# Patient Record
Sex: Female | Born: 1940 | Race: White | Hispanic: No | Smoking: Never smoker
Health system: Southern US, Community
[De-identification: ages and names within clinical notes are randomized; demographics above are authoritative.]

## PROBLEM LIST (undated history)

## (undated) DIAGNOSIS — G4730 Sleep apnea, unspecified: Secondary | ICD-10-CM

## (undated) DIAGNOSIS — C801 Malignant (primary) neoplasm, unspecified: Secondary | ICD-10-CM

## (undated) DIAGNOSIS — I2699 Other pulmonary embolism without acute cor pulmonale: Secondary | ICD-10-CM

## (undated) DIAGNOSIS — G629 Polyneuropathy, unspecified: Secondary | ICD-10-CM

## (undated) DIAGNOSIS — I10 Essential (primary) hypertension: Secondary | ICD-10-CM

## (undated) DIAGNOSIS — E785 Hyperlipidemia, unspecified: Secondary | ICD-10-CM

## (undated) DIAGNOSIS — N189 Chronic kidney disease, unspecified: Secondary | ICD-10-CM

## (undated) DIAGNOSIS — M1990 Unspecified osteoarthritis, unspecified site: Secondary | ICD-10-CM

## (undated) DIAGNOSIS — F329 Major depressive disorder, single episode, unspecified: Secondary | ICD-10-CM

## (undated) DIAGNOSIS — F32A Depression, unspecified: Secondary | ICD-10-CM

## (undated) HISTORY — PX: OTHER SURGICAL HISTORY: SHX169

## (undated) HISTORY — PX: ABDOMINAL HYSTERECTOMY: SHX81

## (undated) HISTORY — PX: CHOLECYSTECTOMY: SHX55

## (undated) HISTORY — DX: Polyneuropathy, unspecified: G62.9

---

## 2004-08-04 ENCOUNTER — Emergency Department: Payer: Self-pay | Admitting: Emergency Medicine

## 2004-08-25 ENCOUNTER — Ambulatory Visit: Payer: Self-pay

## 2004-10-05 ENCOUNTER — Other Ambulatory Visit: Payer: Self-pay

## 2004-10-19 ENCOUNTER — Ambulatory Visit: Payer: Self-pay | Admitting: General Practice

## 2004-10-31 ENCOUNTER — Encounter: Payer: Self-pay | Admitting: General Practice

## 2004-11-22 ENCOUNTER — Encounter: Payer: Self-pay | Admitting: General Practice

## 2004-12-23 ENCOUNTER — Encounter: Payer: Self-pay | Admitting: General Practice

## 2007-06-11 ENCOUNTER — Ambulatory Visit: Payer: Self-pay | Admitting: Internal Medicine

## 2007-06-14 ENCOUNTER — Ambulatory Visit: Payer: Self-pay | Admitting: Internal Medicine

## 2007-08-18 ENCOUNTER — Ambulatory Visit: Payer: Self-pay | Admitting: Internal Medicine

## 2007-12-13 ENCOUNTER — Ambulatory Visit: Payer: Self-pay | Admitting: Internal Medicine

## 2008-04-24 HISTORY — PX: GASTRIC BYPASS: SHX52

## 2008-07-09 ENCOUNTER — Ambulatory Visit: Payer: Self-pay | Admitting: Internal Medicine

## 2008-07-21 ENCOUNTER — Ambulatory Visit: Payer: Self-pay | Admitting: Gastroenterology

## 2009-01-25 ENCOUNTER — Ambulatory Visit: Payer: Self-pay | Admitting: Nephrology

## 2009-04-24 HISTORY — PX: FACIAL COSMETIC SURGERY: SHX629

## 2009-05-14 ENCOUNTER — Ambulatory Visit: Payer: Self-pay | Admitting: Internal Medicine

## 2010-05-19 ENCOUNTER — Ambulatory Visit: Payer: Self-pay | Admitting: Internal Medicine

## 2011-05-13 ENCOUNTER — Ambulatory Visit: Payer: Self-pay

## 2011-05-13 LAB — URINALYSIS, COMPLETE

## 2011-05-15 LAB — URINE CULTURE

## 2011-05-22 ENCOUNTER — Ambulatory Visit: Payer: Self-pay | Admitting: Internal Medicine

## 2011-10-30 ENCOUNTER — Encounter (INDEPENDENT_AMBULATORY_CARE_PROVIDER_SITE_OTHER): Payer: Medicare Other | Admitting: Ophthalmology

## 2011-10-30 DIAGNOSIS — H43819 Vitreous degeneration, unspecified eye: Secondary | ICD-10-CM

## 2011-10-30 DIAGNOSIS — H33009 Unspecified retinal detachment with retinal break, unspecified eye: Secondary | ICD-10-CM

## 2011-10-30 DIAGNOSIS — D3130 Benign neoplasm of unspecified choroid: Secondary | ICD-10-CM

## 2011-10-30 DIAGNOSIS — H2510 Age-related nuclear cataract, unspecified eye: Secondary | ICD-10-CM

## 2011-10-30 NOTE — H&P (Signed)
Ann Russell is an 71 y.o. female.   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  No past surgical history on file.  No family history on file. Social History:  does not have a smoking history on file. She does not have any smokeless tobacco history on file. Her alcohol and drug histories not on file.  Allergies: Allergies not on file  No prescriptions prior to admission    Review of systems otherwise negative  There were no vitals taken for this visit.  Physical exam: Mental status: oriented x3. Eyes: See eye exam associated with this date of surgery in media tab.  Scanned in by scanning center Ears, Nose, Throat: within normal limits Neck: Within Normal limits General: within normal limits Chest: Within normal limits Breast: deferred Heart: Within normal limits Abdomen: Within normal limits GU: deferred Extremities: within normal limits Skin: within normal limits  Assessment/Plan Chronic inferior retinal detachment left eye Plan: To [facility] for Scleral buckle left eye  MATTHEWS, Chrystie Nose 10/30/2011, 5:11 PM

## 2011-10-31 ENCOUNTER — Encounter (HOSPITAL_COMMUNITY): Payer: Self-pay | Admitting: Pharmacy Technician

## 2011-11-06 ENCOUNTER — Encounter (HOSPITAL_COMMUNITY): Payer: Self-pay | Admitting: *Deleted

## 2011-11-06 MED ORDER — CEFAZOLIN SODIUM-DEXTROSE 2-3 GM-% IV SOLR
2.0000 g | INTRAVENOUS | Status: DC
  Filled 2011-11-06: qty 50

## 2011-11-06 MED ORDER — TROPICAMIDE 1 % OP SOLN: 1.0000 [drp] | OPHTHALMIC | Status: DC | PRN

## 2011-11-06 MED ORDER — CYCLOPENTOLATE HCL 1 % OP SOLN: 1.0000 [drp] | OPHTHALMIC | Status: DC | PRN

## 2011-11-06 MED ORDER — PHENYLEPHRINE HCL 2.5 % OP SOLN: 1.0000 [drp] | OPHTHALMIC | Status: DC | PRN

## 2011-11-06 MED ORDER — GATIFLOXACIN 0.5 % OP SOLN: 1.0000 [drp] | OPHTHALMIC | Status: DC | PRN

## 2011-11-06 NOTE — Progress Notes (Addendum)
Cardiologist Dr Drema Dallas at [place] [facility]. Nephrologist is Dr Holley Raring in [place].  Pt has had a EKG, she thinks in he past year and she has a history of sleep apnea, CPAP has been discontinued after 100 lb weight loss- pt was followed by cardiologist about sleep apena.  I faxed a request for EKG, Stress Test, 2 D echo. , office notes , CPAP information- -notes that she was cleared from using the CPAP.  Dr Mack Guise is her PCP.

## 2011-11-07 ENCOUNTER — Encounter (HOSPITAL_COMMUNITY): Payer: Self-pay | Admitting: Anesthesiology

## 2011-11-07 ENCOUNTER — Other Ambulatory Visit: Payer: Self-pay

## 2011-11-07 ENCOUNTER — Ambulatory Visit (HOSPITAL_COMMUNITY)
Admission: RE | Admit: 2011-11-07 | Discharge: 2011-11-08 | Disposition: A | Discharge status: Home / Self Care | Payer: Medicare Other | Source: Ambulatory Visit | Attending: Ophthalmology | Admitting: Ophthalmology

## 2011-11-07 ENCOUNTER — Encounter (HOSPITAL_COMMUNITY): Payer: Self-pay | Admitting: *Deleted

## 2011-11-07 ENCOUNTER — Encounter (HOSPITAL_COMMUNITY)
Admission: RE | Disposition: A | Discharge status: Home / Self Care | Payer: Self-pay | Source: Ambulatory Visit | Attending: Ophthalmology

## 2011-11-07 ENCOUNTER — Ambulatory Visit (HOSPITAL_COMMUNITY): Payer: Medicare Other | Admitting: Anesthesiology

## 2011-11-07 ENCOUNTER — Ambulatory Visit (HOSPITAL_COMMUNITY): Payer: Medicare Other

## 2011-11-07 DIAGNOSIS — H33009 Unspecified retinal detachment with retinal break, unspecified eye: Secondary | ICD-10-CM

## 2011-11-07 DIAGNOSIS — N289 Disorder of kidney and ureter, unspecified: Secondary | ICD-10-CM | POA: Insufficient documentation

## 2011-11-07 DIAGNOSIS — I10 Essential (primary) hypertension: Secondary | ICD-10-CM | POA: Insufficient documentation

## 2011-11-07 DIAGNOSIS — G4730 Sleep apnea, unspecified: Secondary | ICD-10-CM | POA: Insufficient documentation

## 2011-11-07 HISTORY — DX: Depression, unspecified: F32.A

## 2011-11-07 HISTORY — DX: Chronic kidney disease, unspecified: N18.9

## 2011-11-07 HISTORY — DX: Major depressive disorder, single episode, unspecified: F32.9

## 2011-11-07 HISTORY — DX: Hyperlipidemia, unspecified: E78.5

## 2011-11-07 HISTORY — DX: Unspecified osteoarthritis, unspecified site: M19.90

## 2011-11-07 HISTORY — DX: Essential (primary) hypertension: I10

## 2011-11-07 HISTORY — DX: Sleep apnea, unspecified: G47.30

## 2011-11-07 HISTORY — PX: SCLERAL BUCKLE: SHX5340

## 2011-11-07 HISTORY — DX: Malignant (primary) neoplasm, unspecified: C80.1

## 2011-11-07 LAB — BASIC METABOLIC PANEL
Chloride: 100 mEq/L (ref 96–112)
GFR calc Af Amer: 42 mL/min — ABNORMAL LOW (ref >90)
GFR calc non Af Amer: 36 mL/min — ABNORMAL LOW (ref >90)
Glucose, Bld: 97 mg/dL (ref 70–99)
Potassium: 4.5 mEq/L (ref 3.5–5.1)
Sodium: 134 mEq/L — ABNORMAL LOW (ref 135–145)

## 2011-11-07 LAB — SURGICAL PCR SCREEN
MRSA, PCR: NEGATIVE
Staphylococcus aureus: NEGATIVE

## 2011-11-07 LAB — CBC
Hemoglobin: 11.9 g/dL — ABNORMAL LOW (ref 12.0–15.0)
MCHC: 34.4 g/dL (ref 30.0–36.0)
RDW: 15.6 % — ABNORMAL HIGH (ref 11.5–15.5)
WBC: 7.1 10*3/uL (ref 4.0–10.5)

## 2011-11-07 SURGERY — SCLERAL BUCKLE
Anesthesia: General | Site: Eye | Laterality: Left | Wound class: Clean

## 2011-11-07 MED ORDER — MORPHINE SULFATE 2 MG/ML IJ SOLN
1.0000 mg | INTRAMUSCULAR | Status: DC | PRN
  Administered 2011-11-07: 2 mg via INTRAVENOUS
  Filled 2011-11-07: qty 1

## 2011-11-07 MED ORDER — ATROPINE SULFATE 1 % OP SOLN
OPHTHALMIC | Status: DC | PRN
  Administered 2011-11-07: 1 [drp] via OPHTHALMIC

## 2011-11-07 MED ORDER — BACITRACIN-POLYMYXIN B 500-10000 UNIT/GM OP OINT
TOPICAL_OINTMENT | OPHTHALMIC | Status: AC
  Filled 2011-11-07: qty 3.5

## 2011-11-07 MED ORDER — BACITRACIN-POLYMYXIN B 500-10000 UNIT/GM OP OINT
TOPICAL_OINTMENT | OPHTHALMIC | Status: DC | PRN
  Administered 2011-11-07: 1 via OPHTHALMIC

## 2011-11-07 MED ORDER — EPINEPHRINE HCL 1 MG/ML IJ SOLN
INTRAMUSCULAR | Status: AC
  Filled 2011-11-07: qty 1

## 2011-11-07 MED ORDER — HYDROMORPHONE HCL PF 1 MG/ML IJ SOLN
0.2500 mg | INTRAMUSCULAR | Status: DC | PRN
  Administered 2011-11-07 (×3): 0.5 mg via INTRAVENOUS

## 2011-11-07 MED ORDER — ENALAPRIL MALEATE 2.5 MG PO TABS
2.5000 mg | ORAL_TABLET | Freq: Two times a day (BID) | ORAL | Status: DC
  Administered 2011-11-07: 2.5 mg via ORAL
  Filled 2011-11-07 (×4): qty 1

## 2011-11-07 MED ORDER — BUPIVACAINE HCL 0.75 % IJ SOLN
INTRAMUSCULAR | Status: DC | PRN
  Administered 2011-11-07: 10 mL

## 2011-11-07 MED ORDER — MIDAZOLAM HCL 2 MG/2ML IJ SOLN: 0.5000 mg | Freq: Once | INTRAMUSCULAR | Status: DC | PRN

## 2011-11-07 MED ORDER — TEMAZEPAM 15 MG PO CAPS: 15.0000 mg | ORAL_CAPSULE | Freq: Every evening | ORAL | Status: DC | PRN

## 2011-11-07 MED ORDER — CYCLOPENTOLATE HCL 1 % OP SOLN
1.0000 [drp] | OPHTHALMIC | Status: AC | PRN
  Administered 2011-11-07 (×3): 1 [drp] via OPHTHALMIC
  Filled 2011-11-07: qty 2

## 2011-11-07 MED ORDER — FLUOXETINE HCL 20 MG PO CAPS
40.0000 mg | ORAL_CAPSULE | Freq: Every day | ORAL | Status: DC
  Administered 2011-11-08: 40 mg via ORAL
  Filled 2011-11-07: qty 2

## 2011-11-07 MED ORDER — BSS PLUS IO SOLN
INTRAOCULAR | Status: AC
  Filled 2011-11-07: qty 500

## 2011-11-07 MED ORDER — PREDNISOLONE ACETATE 1 % OP SUSP
1.0000 [drp] | Freq: Four times a day (QID) | OPHTHALMIC | Status: DC
  Administered 2011-11-08: 1 [drp] via OPHTHALMIC
  Filled 2011-11-07: qty 1

## 2011-11-07 MED ORDER — LATANOPROST 0.005 % OP SOLN
1.0000 [drp] | Freq: Every day | OPHTHALMIC | Status: DC
  Filled 2011-11-07: qty 2.5

## 2011-11-07 MED ORDER — LIDOCAINE HCL (CARDIAC) 20 MG/ML IV SOLN
INTRAVENOUS | Status: DC | PRN
  Administered 2011-11-07: 20 mg via INTRAVENOUS

## 2011-11-07 MED ORDER — BUPIVACAINE HCL 0.75 % IJ SOLN
INTRAMUSCULAR | Status: AC
  Filled 2011-11-07: qty 10

## 2011-11-07 MED ORDER — GATIFLOXACIN 0.5 % OP SOLN
1.0000 [drp] | OPHTHALMIC | Status: AC | PRN
  Administered 2011-11-07 (×3): 1 [drp] via OPHTHALMIC
  Filled 2011-11-07 (×3): qty 2.5

## 2011-11-07 MED ORDER — CIPROFLOXACIN HCL 500 MG PO TABS
500.0000 mg | ORAL_TABLET | Freq: Two times a day (BID) | ORAL | Status: DC
  Administered 2011-11-07 – 2011-11-08 (×2): 500 mg via ORAL
  Filled 2011-11-07 (×3): qty 1

## 2011-11-07 MED ORDER — ROCURONIUM BROMIDE 100 MG/10ML IV SOLN
INTRAVENOUS | Status: DC | PRN
  Administered 2011-11-07: 50 mg via INTRAVENOUS

## 2011-11-07 MED ORDER — NEOSTIGMINE METHYLSULFATE 1 MG/ML IJ SOLN
INTRAMUSCULAR | Status: DC | PRN
  Administered 2011-11-07: 4 mg via INTRAVENOUS

## 2011-11-07 MED ORDER — TROPICAMIDE 1 % OP SOLN
1.0000 [drp] | OPHTHALMIC | Status: AC | PRN
  Administered 2011-11-07 (×3): 1 [drp] via OPHTHALMIC
  Filled 2011-11-07: qty 3

## 2011-11-07 MED ORDER — TETRACAINE HCL 0.5 % OP SOLN
2.0000 [drp] | Freq: Once | OPHTHALMIC | Status: DC
  Filled 2011-11-07: qty 2

## 2011-11-07 MED ORDER — 0.9 % SODIUM CHLORIDE (POUR BTL) OPTIME
TOPICAL | Status: DC | PRN
  Administered 2011-11-07: 100 mL

## 2011-11-07 MED ORDER — HYDROMORPHONE HCL PF 1 MG/ML IJ SOLN
INTRAMUSCULAR | Status: AC
  Filled 2011-11-07: qty 1

## 2011-11-07 MED ORDER — BSS IO SOLN
INTRAOCULAR | Status: DC | PRN
  Administered 2011-11-07: 15 mL via INTRAOCULAR

## 2011-11-07 MED ORDER — SODIUM CHLORIDE 0.45 % IV SOLN
INTRAVENOUS | Status: DC
  Administered 2011-11-07: 20 mL/h via INTRAVENOUS

## 2011-11-07 MED ORDER — DEXAMETHASONE SODIUM PHOSPHATE 10 MG/ML IJ SOLN
INTRAMUSCULAR | Status: AC
  Filled 2011-11-07: qty 1

## 2011-11-07 MED ORDER — MINERAL OIL LIGHT 100 % EX OIL
TOPICAL_OIL | CUTANEOUS | Status: AC
  Filled 2011-11-07: qty 25

## 2011-11-07 MED ORDER — MAGNESIUM HYDROXIDE 400 MG/5ML PO SUSP: 15.0000 mL | Freq: Four times a day (QID) | ORAL | Status: DC | PRN

## 2011-11-07 MED ORDER — ACETAZOLAMIDE SODIUM 500 MG IJ SOLR
1000.0000 mg | Freq: Once | INTRAMUSCULAR | Status: AC
  Administered 2011-11-08: 1000 mg via INTRAVENOUS
  Filled 2011-11-07 (×2): qty 500

## 2011-11-07 MED ORDER — DEXAMETHASONE SODIUM PHOSPHATE 10 MG/ML IJ SOLN
INTRAMUSCULAR | Status: DC | PRN
  Administered 2011-11-07: 10 mg

## 2011-11-07 MED ORDER — ONDANSETRON HCL 4 MG/2ML IJ SOLN
INTRAMUSCULAR | Status: DC | PRN
  Administered 2011-11-07: 4 mg via INTRAVENOUS

## 2011-11-07 MED ORDER — FLUOXETINE HCL 40 MG PO CAPS: 40.0000 mg | ORAL_CAPSULE | Freq: Every day | ORAL | Status: DC

## 2011-11-07 MED ORDER — SODIUM HYALURONATE 10 MG/ML IO SOLN
INTRAOCULAR | Status: AC
  Filled 2011-11-07: qty 0.85

## 2011-11-07 MED ORDER — GATIFLOXACIN 0.5 % OP SOLN
1.0000 [drp] | Freq: Four times a day (QID) | OPHTHALMIC | Status: DC
  Administered 2011-11-08: 1 [drp] via OPHTHALMIC
  Filled 2011-11-07: qty 2.5

## 2011-11-07 MED ORDER — MUPIROCIN 2 % EX OINT
TOPICAL_OINTMENT | Freq: Two times a day (BID) | CUTANEOUS | Status: DC
  Administered 2011-11-07: 1 via NASAL
  Filled 2011-11-07: qty 22

## 2011-11-07 MED ORDER — SODIUM HYALURONATE 10 MG/ML IO SOLN
INTRAOCULAR | Status: DC | PRN
  Administered 2011-11-07: 0.85 mL via INTRAOCULAR

## 2011-11-07 MED ORDER — GLYCOPYRROLATE 0.2 MG/ML IJ SOLN
INTRAMUSCULAR | Status: DC | PRN
  Administered 2011-11-07: .6 mg via INTRAVENOUS
  Administered 2011-11-07: 0.1 mg via INTRAVENOUS

## 2011-11-07 MED ORDER — PROMETHAZINE HCL 25 MG/ML IJ SOLN
6.2500 mg | INTRAMUSCULAR | Status: DC | PRN
  Administered 2011-11-07: 6.25 mg via INTRAVENOUS

## 2011-11-07 MED ORDER — TRIAMCINOLONE ACETONIDE 40 MG/ML IJ SUSP
INTRAMUSCULAR | Status: AC
  Filled 2011-11-07: qty 1

## 2011-11-07 MED ORDER — PROMETHAZINE HCL 25 MG/ML IJ SOLN
INTRAMUSCULAR | Status: AC
  Filled 2011-11-07: qty 1

## 2011-11-07 MED ORDER — DOCUSATE SODIUM 100 MG PO CAPS
100.0000 mg | ORAL_CAPSULE | Freq: Two times a day (BID) | ORAL | Status: DC
  Administered 2011-11-07 – 2011-11-08 (×2): 100 mg via ORAL
  Filled 2011-11-07 (×2): qty 1

## 2011-11-07 MED ORDER — SODIUM CHLORIDE 0.9 % IV SOLN
INTRAVENOUS | Status: DC
  Administered 2011-11-07: 20 mL/h via INTRAVENOUS

## 2011-11-07 MED ORDER — CEFAZOLIN SODIUM 1-5 GM-% IV SOLN
INTRAVENOUS | Status: DC | PRN
  Administered 2011-11-07: 2 g via INTRAVENOUS

## 2011-11-07 MED ORDER — MEPERIDINE HCL 25 MG/ML IJ SOLN: 6.2500 mg | INTRAMUSCULAR | Status: DC | PRN

## 2011-11-07 MED ORDER — ATROPINE SULFATE 1 % OP SOLN
OPHTHALMIC | Status: AC
  Filled 2011-11-07: qty 2

## 2011-11-07 MED ORDER — BRIMONIDINE TARTRATE 0.2 % OP SOLN
1.0000 [drp] | Freq: Two times a day (BID) | OPHTHALMIC | Status: DC
  Filled 2011-11-07: qty 5

## 2011-11-07 MED ORDER — PROPOFOL 10 MG/ML IV EMUL
INTRAVENOUS | Status: DC | PRN
  Administered 2011-11-07: 150 mg via INTRAVENOUS

## 2011-11-07 MED ORDER — FENTANYL CITRATE 0.05 MG/ML IJ SOLN
INTRAMUSCULAR | Status: DC | PRN
  Administered 2011-11-07: 100 ug via INTRAVENOUS
  Administered 2011-11-07 (×3): 50 ug via INTRAVENOUS

## 2011-11-07 MED ORDER — BACITRACIN-POLYMYXIN B 500-10000 UNIT/GM OP OINT
1.0000 "application " | TOPICAL_OINTMENT | Freq: Four times a day (QID) | OPHTHALMIC | Status: DC
  Administered 2011-11-08: 1 via OPHTHALMIC
  Filled 2011-11-07: qty 3.5

## 2011-11-07 MED ORDER — LIDOCAINE HCL 4 % MT SOLN
OROMUCOSAL | Status: DC | PRN
  Administered 2011-11-07: 4 mL via TOPICAL

## 2011-11-07 MED ORDER — HEMOSTATIC AGENTS (NO CHARGE) OPTIME
TOPICAL | Status: DC | PRN
  Administered 2011-11-07: 1 via TOPICAL

## 2011-11-07 MED ORDER — PHENYLEPHRINE HCL 2.5 % OP SOLN
1.0000 [drp] | OPHTHALMIC | Status: AC | PRN
  Administered 2011-11-07 (×3): 1 [drp] via OPHTHALMIC
  Filled 2011-11-07: qty 3

## 2011-11-07 MED ORDER — ACETAZOLAMIDE SODIUM 500 MG IJ SOLR
INTRAMUSCULAR | Status: AC
  Filled 2011-11-07: qty 500

## 2011-11-07 MED ORDER — HYPROMELLOSE (GONIOSCOPIC) 2.5 % OP SOLN
OPHTHALMIC | Status: AC
  Filled 2011-11-07: qty 15

## 2011-11-07 MED ORDER — GENTAMICIN SULFATE 40 MG/ML IJ SOLN
INTRAMUSCULAR | Status: AC
  Filled 2011-11-07: qty 2

## 2011-11-07 MED ORDER — ONDANSETRON HCL 4 MG/2ML IJ SOLN: 4.0000 mg | Freq: Four times a day (QID) | INTRAMUSCULAR | Status: DC | PRN

## 2011-11-07 MED ORDER — SODIUM CHLORIDE 0.9 % IV SOLN
INTRAVENOUS | Status: DC | PRN
  Administered 2011-11-07: 15:00:00 via INTRAVENOUS

## 2011-11-07 MED ORDER — ACETAMINOPHEN 325 MG PO TABS: 325.0000 mg | ORAL_TABLET | ORAL | Status: DC | PRN

## 2011-11-07 MED ORDER — POLYMYXIN B SULFATE 500000 UNITS IJ SOLR
INTRAMUSCULAR | Status: DC | PRN
  Administered 2011-11-07: 17:00:00

## 2011-11-07 MED ORDER — HYDROCODONE-ACETAMINOPHEN 5-325 MG PO TABS
1.0000 | ORAL_TABLET | ORAL | Status: DC | PRN
  Administered 2011-11-07 – 2011-11-08 (×3): 2 via ORAL
  Filled 2011-11-07 (×3): qty 2

## 2011-11-07 MED ORDER — POLYMYXIN B SULFATE 500000 UNITS IJ SOLR
INTRAMUSCULAR | Status: AC
  Filled 2011-11-07: qty 1

## 2011-11-07 SURGICAL SUPPLY — 64 items
APPLICATOR DR MATTHEWS STRL (MISCELLANEOUS) ×16 IMPLANT
BAND SCLERAL BUCKLING TYPE 240 (Ophthalmic Related) ×2 IMPLANT
BLADE EYE CATARACT 19 1.4 BEAV (BLADE) ×2 IMPLANT
BLADE MVR KNIFE 19G (BLADE) IMPLANT
BLADE SURG 15 STRL LF DISP TIS (BLADE) IMPLANT
BLADE SURG 15 STRL SS (BLADE)
CANNULA ANT CHAM MAIN (OPHTHALMIC RELATED) IMPLANT
CANNULA DUAL BORE 23G (CANNULA) IMPLANT
CORDS BIPOLAR (ELECTRODE) IMPLANT
COTTONBALL LRG STERILE PKG (GAUZE/BANDAGES/DRESSINGS) ×6 IMPLANT
COVER SURGICAL LIGHT HANDLE (MISCELLANEOUS) ×2 IMPLANT
DRAPE OPHTHALMIC 77X100 STRL (CUSTOM PROCEDURE TRAY) ×4 IMPLANT
ERASER HMR WETFIELD 23G BP (MISCELLANEOUS) IMPLANT
FILTER BLUE MILLIPORE (MISCELLANEOUS) IMPLANT
FILTER STRAW FLUID ASPIR (MISCELLANEOUS) IMPLANT
GAS OPHTHALMIC (MISCELLANEOUS) IMPLANT
GLOVE SS BIOGEL STRL SZ 6.5 (GLOVE) ×1 IMPLANT
GLOVE SS BIOGEL STRL SZ 7 (GLOVE) ×1 IMPLANT
GLOVE SUPERSENSE BIOGEL SZ 6.5 (GLOVE) ×1
GLOVE SUPERSENSE BIOGEL SZ 7 (GLOVE) ×1
GLOVE SURG 8.5 LATEX PF (GLOVE) ×2 IMPLANT
GOWN STRL NON-REIN LRG LVL3 (GOWN DISPOSABLE) ×6 IMPLANT
ILLUMINATOR CHOW PICK 25GA (MISCELLANEOUS) IMPLANT
KIT BASIN OR (CUSTOM PROCEDURE TRAY) ×2 IMPLANT
KIT PERFLUORON PROCEDURE 5ML (MISCELLANEOUS) IMPLANT
KIT ROOM TURNOVER OR (KITS) ×2 IMPLANT
KNIFE CRESCENT 1.75 EDGEAHEAD (BLADE) IMPLANT
KNIFE GRIESHABER SHARP 2.5MM (MISCELLANEOUS) ×6 IMPLANT
MARKER SKIN DUAL TIP RULER LAB (MISCELLANEOUS) IMPLANT
NEEDLE 18GX1X1/2 (RX/OR ONLY) (NEEDLE) ×2 IMPLANT
NEEDLE 25GX 5/8IN NON SAFETY (NEEDLE) IMPLANT
NEEDLE 27GAX1X1/2 (NEEDLE) IMPLANT
NEEDLE HYPO 30X.5 LL (NEEDLE) ×4 IMPLANT
NS IRRIG 1000ML POUR BTL (IV SOLUTION) ×2 IMPLANT
PACK VITRECTOMY CUSTOM (CUSTOM PROCEDURE TRAY) ×2 IMPLANT
PAD ARMBOARD 7.5X6 YLW CONV (MISCELLANEOUS) ×4 IMPLANT
PAD EYE OVAL STERILE LF (GAUZE/BANDAGES/DRESSINGS) IMPLANT
PAK VITRECTOMY PIK 25 GA (OPHTHALMIC RELATED) IMPLANT
PROBE DIRECTIONAL LASER (MISCELLANEOUS) IMPLANT
REPL STRA BRUSH NEEDLE (NEEDLE) IMPLANT
RESERVOIR BACK FLUSH (MISCELLANEOUS) IMPLANT
ROLLS DENTAL (MISCELLANEOUS) ×4 IMPLANT
SET FLUID INJECTOR (SET/KITS/TRAYS/PACK) IMPLANT
SET VGFI TUBING 8065808002 (SET/KITS/TRAYS/PACK) IMPLANT
SPONGE SURGIFOAM ABS GEL 12-7 (HEMOSTASIS) ×2 IMPLANT
STOPCOCK 4 WAY LG BORE MALE ST (IV SETS) IMPLANT
SUT CHROMIC 7 0 TG140 8 (SUTURE) ×2 IMPLANT
SUT ETHILON 9 0 TG140 8 (SUTURE) IMPLANT
SUT MERSILENE 4 0 RV 2 (SUTURE) ×4 IMPLANT
SUT SILK 2 0 (SUTURE) ×1
SUT SILK 2-0 18XBRD TIE 12 (SUTURE) ×1 IMPLANT
SUT SILK 4 0 RB 1 (SUTURE) ×2 IMPLANT
SUT VICRYL 7 0 TG140 8 (SUTURE) IMPLANT
SYR 20CC LL (SYRINGE) ×2 IMPLANT
SYR 5ML LL (SYRINGE) IMPLANT
SYR BULB 3OZ (MISCELLANEOUS) ×2 IMPLANT
SYR TB 1ML LUER SLIP (SYRINGE) ×2 IMPLANT
SYRINGE 10CC LL (SYRINGE) IMPLANT
TIRE 11 SCLERAL TYPE 279 (Ophthalmic Related) ×2 IMPLANT
TOWEL OR 17X24 6PK STRL BLUE (TOWEL DISPOSABLE) ×6 IMPLANT
TUBING ART PRESS 12 MALE/MALE (MISCELLANEOUS) IMPLANT
VITREORETINAL VISCODISSEC (MISCELLANEOUS) IMPLANT
WATER STERILE IRR 1000ML POUR (IV SOLUTION) ×2 IMPLANT
WIPE INSTRUMENT VISIWIPE 73X73 (MISCELLANEOUS) ×2 IMPLANT

## 2011-11-07 NOTE — Progress Notes (Deleted)
Per pharmacy Cyclogyl 1% replaced with Cyclomydril

## 2011-11-07 NOTE — Brief Op Note (Signed)
Brief Operative note   Preoperative diagnosis:  Pre-Op Diagnosis Codes:    * Retinal detachment with retinal defect, unspecified [361.00] Postoperative diagnosis  Post-Op Diagnosis Codes:    * Retinal detachment with retinal defect, unspecified [361.00]  Procedures: Scleral Buckle left eye  Surgeon:  Hayden Pedro, MD...  Assistant:  Deatra Ina SA    Anesthesia: General  Specimen: none  Estimated blood loss:  1cc  Complications: none  Patient sent to PACU in good condition  Composed by Hayden Pedro MD  Dictation number: [phone]

## 2011-11-07 NOTE — Preoperative (Signed)
Beta Blockers   Reason not to administer Beta Blockers:Not Applicable 

## 2011-11-07 NOTE — Anesthesia Procedure Notes (Signed)
Procedure Name: Intubation Date/Time: 11/07/2011 3:51 PM Performed by: Jon Gills A Pre-anesthesia Checklist: Patient identified, Emergency Drugs available, Suction available and Patient being monitored Patient Re-evaluated:Patient Re-evaluated prior to inductionOxygen Delivery Method: Circle system utilized Preoxygenation: Pre-oxygenation with 100% oxygen Intubation Type: IV induction Ventilation: Mask ventilation without difficulty Laryngoscope Size: Miller and 2 Grade View: Grade I Tube type: Oral Tube size: 7.0 mm Number of attempts: 1 Airway Equipment and Method: Stylet and LTA kit utilized Placement Confirmation: ETT inserted through vocal cords under direct vision,  positive ETCO2 and breath sounds checked- equal and bilateral Secured at: 22 cm Tube secured with: Tape Dental Injury: Teeth and Oropharynx as per pre-operative assessment

## 2011-11-07 NOTE — Anesthesia Postprocedure Evaluation (Signed)
  Anesthesia Post-op Note  Patient: Ann Russell  Procedure(s) Performed: Procedure(s) (LRB): SCLERAL BUCKLE (Left) DIODE LASER APPLICATION (Left)  Patient Location: PACU  Anesthesia Type: General  Level of Consciousness: sedated  Airway and Oxygen Therapy: Patient Spontanous Breathing and Patient connected to nasal cannula oxygen  Post-op Pain: none  Post-op Assessment: Post-op Vital signs reviewed, Patient's Cardiovascular Status Stable, Respiratory Function Stable, Patent Airway, No signs of Nausea or vomiting and Pain level controlled  Post-op Vital Signs: Reviewed and stable  Complications: No apparent anesthesia complications

## 2011-11-07 NOTE — H&P (Signed)
I examined the patient today and there is no change in the medical status 

## 2011-11-07 NOTE — Anesthesia Preprocedure Evaluation (Signed)
Anesthesia Evaluation  Patient identified by MRN, date of birth, ID band Patient awake    Reviewed: Allergy & Precautions, H&P , NPO status , Patient's Chart, lab work & pertinent test results  History of Anesthesia Complications Negative for: history of anesthetic complications  Airway Mallampati: II TM Distance: >3 FB Neck ROM: Full    Dental No notable dental hx. (+) Teeth Intact and Dental Advisory Given   Pulmonary sleep apnea (no longer needs CPAP after 100+ lb weight loss) ,  breath sounds clear to auscultation  Pulmonary exam normal       Cardiovascular hypertension, Pt. on medications Rhythm:Regular Rate:Normal     Neuro/Psych    GI/Hepatic Neg liver ROS,   Endo/Other  negative endocrine ROS  Renal/GU Renal InsufficiencyRenal disease (creat 1.42)     Musculoskeletal   Abdominal   Peds  Hematology   Anesthesia Other Findings   Reproductive/Obstetrics                           Anesthesia Physical Anesthesia Plan  ASA: II  Anesthesia Plan: General   Post-op Pain Management:    Induction: Intravenous  Airway Management Planned: Oral ETT  Additional Equipment:   Intra-op Plan:   Post-operative Plan: Extubation in OR  Informed Consent: I have reviewed the patients History and Physical, chart, labs and discussed the procedure including the risks, benefits and alternatives for the proposed anesthesia with the patient or authorized representative who has indicated his/her understanding and acceptance.   Dental advisory given  Plan Discussed with: CRNA and Surgeon  Anesthesia Plan Comments: (Plan routine monitors, GETA)        Anesthesia Quick Evaluation

## 2011-11-08 MED ORDER — PREDNISOLONE ACETATE 1 % OP SUSP: 1.0000 [drp] | Freq: Four times a day (QID) | OPHTHALMIC | Status: AC

## 2011-11-08 MED ORDER — GATIFLOXACIN 0.5 % OP SOLN: 1.0000 [drp] | Freq: Four times a day (QID) | OPHTHALMIC | Status: DC

## 2011-11-08 MED ORDER — BACITRACIN-POLYMYXIN B 500-10000 UNIT/GM OP OINT: 1.0000 "application " | TOPICAL_OINTMENT | Freq: Four times a day (QID) | OPHTHALMIC | Status: AC

## 2011-11-08 MED ORDER — HYDROCODONE-ACETAMINOPHEN 5-325 MG PO TABS: 1.0000 | ORAL_TABLET | ORAL | Status: AC | PRN

## 2011-11-08 NOTE — Op Note (Signed)
Ann Russell, Ann Russell                 ACCOUNT NO.:  [id number]  MEDICAL RECORD NO.:  [id number]  LOCATION:  [id number]                        FACILITY:  [facility]  PHYSICIAN:  Chrystie Nose. Zigmund Daniel, M.D. DATE OF BIRTH:  09/10/1940  DATE OF PROCEDURE:  11/07/2011 DATE OF DISCHARGE:                              OPERATIVE REPORT   ADMISSION DIAGNOSIS:  Rhegmatogenous retinal detachment, right eye.  PROCEDURE:  Scleral buckle, right eye with retinal photocoagulation, right eye.  SURGEON:  Chrystie Nose. Zigmund Daniel, MD  ASSISTANT:  Deatra Ina, SA.  ANESTHESIA:  General.  DETAILS:  After usual prep and drape, 360-degree limbal peritomy isolation of 4 rectus muscles on 2-0 silk.  Scleral dissection from 2:30 o'clock to 10 o'clock to admit a number 279 intrascleral implant 1 mm trim from the posterior edge, diathermy placed in the bed.  279 implant was placed against the globe, 240 band placed around the eye with a 270 sleeve at 11 o'clock and belt loops at 11 and 1 o'clock.  Perforation site chosen at 5 o'clock in the posterior aspect of the bed.  Diathermy cauterization then perforation.  A large amount of clear colorless subretinal fluid came forth.  It was thick and stringy.  Once the fluid stopped, the buckle element was placed.  Two sutures per quadrant for total of 6 scleral sutures were placed in the scleral flaps.  The buckle was adjusted and trimmed.  The band was adjusted and trimmed.  Indirect ophthalmoscopy showed the retina to be lying nicely in place on the scleral buckle.  The indirect ophthalmoscope laser was moved into place. 702 burns were placed on the scleral buckle and around the retinal periphery with the powers between 400 and 480 MW 1000 microns each at 0.1 seconds each.  The buckle was adjusted and trimmed.  The band was adjusted and trimmed.  The conjunctiva was reposited with 7-0 chromic suture.  Paracentesis x2 obtained a closing pressure of 10 with a Baer care tonometer.   Polymyxin and gentamicin were irrigated into tenon space.  Atropine solution was applied.  Decadron 10 mg was injected into the lower subconjunctival space.  Atropine solution was applied as well as Marcaine rinse.  Polysporin ophthalmic ointment, a patch and shield were placed.  The patient was awakened, taken to recovery in a satisfactory condition.  COMPLICATIONS:  None.  DURATION:  1-1/2 hour.     Chrystie Nose. Zigmund Daniel, M.D.     JDM/MEDQ  D:  11/07/2011  T:  11/08/2011  Job:  [id number]

## 2011-11-08 NOTE — Progress Notes (Signed)
11/08/2011, 6:43 AM  Mental Status:  Awake, Alert, Oriented  Anterior segment: Cornea  Clear    Anterior Chamber Clear    Lens:   Cataract  Intra Ocular Pressure 11 mmHg with Tonopen  Vitreous: Clear   Retina:  Attached Good laser reaction   Impression: Excellent result Retina attached   Final Diagnosis: Principal Problem:  *Rhegmatogenous retinal detachment   Plan: start post operative eye drops.  Discharge to home.  Give post operative instructions  Ann Russell 11/08/2011, 6:43 AM

## 2011-11-08 NOTE — Transfer of Care (Signed)
Immediate Anesthesia Transfer of Care Note  Patient: Ann Russell  Procedure(s) Performed: Procedure(s) (LRB): SCLERAL BUCKLE (Left) DIODE LASER APPLICATION (Left)  Patient Location: PACU  Anesthesia Type: General  Level of Consciousness: awake, alert , oriented and patient cooperative  Airway & Oxygen Therapy: Patient Spontanous Breathing and Patient connected to face mask oxygen  Post-op Assessment: Report given to PACU RN and Post -op Vital signs reviewed and stable  Post vital signs: Reviewed and stable  Complications: No apparent anesthesia complications

## 2011-11-08 NOTE — Discharge Summary (Signed)
Discharge summary not needed on [facility] patients per medical records. 

## 2011-11-09 ENCOUNTER — Encounter (HOSPITAL_COMMUNITY): Payer: Self-pay | Admitting: Ophthalmology

## 2011-11-20 ENCOUNTER — Inpatient Hospital Stay (INDEPENDENT_AMBULATORY_CARE_PROVIDER_SITE_OTHER): Payer: Medicare Other | Admitting: Ophthalmology

## 2011-11-20 DIAGNOSIS — H33009 Unspecified retinal detachment with retinal break, unspecified eye: Secondary | ICD-10-CM

## 2011-11-20 DIAGNOSIS — S058X9A Other injuries of unspecified eye and orbit, initial encounter: Secondary | ICD-10-CM

## 2011-12-11 ENCOUNTER — Encounter (INDEPENDENT_AMBULATORY_CARE_PROVIDER_SITE_OTHER): Payer: Medicare Other | Admitting: Ophthalmology

## 2011-12-11 DIAGNOSIS — H33009 Unspecified retinal detachment with retinal break, unspecified eye: Secondary | ICD-10-CM

## 2012-01-08 ENCOUNTER — Encounter (INDEPENDENT_AMBULATORY_CARE_PROVIDER_SITE_OTHER): Payer: Medicare Other | Admitting: Ophthalmology

## 2012-01-08 DIAGNOSIS — H33009 Unspecified retinal detachment with retinal break, unspecified eye: Secondary | ICD-10-CM

## 2012-03-25 ENCOUNTER — Encounter (INDEPENDENT_AMBULATORY_CARE_PROVIDER_SITE_OTHER): Payer: Medicare Other | Admitting: Ophthalmology

## 2012-03-25 DIAGNOSIS — H43819 Vitreous degeneration, unspecified eye: Secondary | ICD-10-CM

## 2012-03-25 DIAGNOSIS — H2510 Age-related nuclear cataract, unspecified eye: Secondary | ICD-10-CM

## 2012-03-25 DIAGNOSIS — D3130 Benign neoplasm of unspecified choroid: Secondary | ICD-10-CM

## 2012-03-25 DIAGNOSIS — H33009 Unspecified retinal detachment with retinal break, unspecified eye: Secondary | ICD-10-CM

## 2012-06-12 ENCOUNTER — Ambulatory Visit: Payer: Self-pay | Admitting: Internal Medicine

## 2012-06-24 ENCOUNTER — Encounter (INDEPENDENT_AMBULATORY_CARE_PROVIDER_SITE_OTHER): Payer: Medicare Other | Admitting: Ophthalmology

## 2013-12-18 DIAGNOSIS — D649 Anemia, unspecified: Secondary | ICD-10-CM | POA: Insufficient documentation

## 2013-12-18 DIAGNOSIS — K250 Acute gastric ulcer with hemorrhage: Secondary | ICD-10-CM | POA: Insufficient documentation

## 2014-01-20 ENCOUNTER — Ambulatory Visit: Payer: Self-pay | Admitting: Internal Medicine

## 2014-09-30 ENCOUNTER — Encounter: Payer: Self-pay | Admitting: Physical Therapy

## 2014-09-30 ENCOUNTER — Ambulatory Visit: Payer: Medicare Other | Attending: Internal Medicine | Admitting: Physical Therapy

## 2014-09-30 DIAGNOSIS — R262 Difficulty in walking, not elsewhere classified: Secondary | ICD-10-CM | POA: Insufficient documentation

## 2014-09-30 DIAGNOSIS — R296 Repeated falls: Secondary | ICD-10-CM | POA: Insufficient documentation

## 2014-09-30 NOTE — Therapy (Signed)
[place] MAIN [facility] SERVICES [address] [place], [place], [address] Phone: [phone]   Fax:  [phone]  Physical Therapy Evaluation  Patient Details  Name: Ann Russell MRN: [id number] Date of Birth: 09/10/1940 Referring Provider:  Idelle Crouch, MD  Encounter Date: 09/30/2014      PT End of Session - 09/30/14 1606    Visit Number 1   Number of Visits 9   Date for PT Re-Evaluation Dec 11, 2014   Authorization Type g codes 1   Authorization Time Period 10   PT Start Time 0315   PT Stop Time 0400   PT Time Calculation (min) 45 min   Equipment Utilized During Treatment Gait belt   Activity Tolerance Patient tolerated treatment well   Behavior During Therapy [facility] for tasks assessed/performed      Past Medical History  Diagnosis Date  . Depression   . Chronic kidney disease     .  Takes Vasotec for kidneys.  . 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 Arthritis     hands  . Sleep apnea   . Hypertension   . Hyperlipemia     Past Surgical History  Procedure Laterality Date  . Gastric bypass  2010  . Rotor cuff      right  . Abdominal hysterectomy    . Buniectomy      bil  . Facial cosmetic surgery  2011  . Cholecystectomy    . Scleral buckle  11/07/2011    Procedure: SCLERAL BUCKLE;  Surgeon: Hayden Pedro, MD;  Location: [facility];  Service: Ophthalmology;  Laterality: Left;    There were no vitals filed for this visit.  Visit Diagnosis:  Difficulty walking      Subjective Assessment - 09/30/14 1605    Subjective Patient feels that she is not very steady on her feet.             [facility] PT Assessment - 09/30/14 0001    Assessment   Medical Diagnosis falls   Onset Date/Surgical Date 09/17/14   Hand Dominance Right   Next MD Visit August   Prior Therapy no   Precautions   Precautions Fall   Required Braces or Orthoses --  no   Balance Screen   Has the patient fallen in the past 6 months Yes    How many times? 3   Has the patient had a decrease in activity level because of a fear of falling?  No   Is the patient reluctant to leave their home because of a fear of falling?  No   Home Environment   Living Environment Private residence   Living Arrangements Spouse/significant other   Type of Home House   Functional Tests   Functional tests Sit to Stand     Out come measures:  5 x sit to stand   22.42 sec     10 MW .62 m/sec    TUG n  21,23 sec    6 MW 795 feet     Patient has unsteady gait with spc, decreased gait speed and  decreased step length and height.  Patient has numbness in BLE ankles and BUE  Hands. Strength is 3/5 BLE hip flex/ abd/add/ER/IR Strength is 4/5 BLE knee flex/ext Strength BLE ankles 3/5 DF/PF                      PT Education -  17-Oct-2014 1605    Education provided Yes   Person(s) Educated Patient   Methods Explanation;Demonstration   Comprehension Verbalized understanding;Returned demonstration                    Plan - 10-17-14 1616    Clinical Impression Statement Patient is 74 year old female  who presents with 3 falls in the past 6 months . She has decreased strength BLE, numbness in hands and feet ,decreased dynamic standing balance, and decreased outcome measures including TUG, 6 MW, 10 MW . Patient will  benefit from skilled PT to improve falls risk, and improve gait.   Pt will benefit from skilled therapeutic intervention in order to improve on the following deficits Abnormal gait;Difficulty walking;Decreased endurance;Decreased activity tolerance;Pain;Decreased balance;Decreased mobility;Decreased strength;Impaired sensation   Rehab Potential Good   PT Frequency 2x / week   PT Duration 8 weeks   PT Next Visit Plan balnace and strength training   PT Home Exercise Plan HEP   Consulted and Agree with Plan of Care Patient          G-Codes - 10-17-2014 1614    Functional Assessment Tool Used TUG 10 MW, 5 x sit to  stand, 6 MW   Functional Limitation Mobility: Walking and moving around   Mobility: Walking and Moving Around Current Status [phone]) At least 60 percent but less than 80 percent impaired, limited or restricted   Mobility: Walking and Moving Around Goal Status [phone]) At least 40 percent but less than 60 percent impaired, limited or restricted       Problem List Patient Active Problem List   Diagnosis Date Noted  . 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    Alanson Puls 10/17/14, 4:24 PM  [place] MAIN [facility] SERVICES [address] [place], [place], [address] Phone: [phone]   Fax:  [phone]

## 2015-09-13 ENCOUNTER — Other Ambulatory Visit: Payer: Self-pay | Admitting: Internal Medicine

## 2015-09-13 DIAGNOSIS — Z1231 Encounter for screening mammogram for malignant neoplasm of breast: Secondary | ICD-10-CM

## 2015-09-15 ENCOUNTER — Ambulatory Visit
Admission: RE | Admit: 2015-09-15 | Discharge: 2015-09-15 | Disposition: A | Discharge status: Home / Self Care | Payer: Medicare Other | Source: Ambulatory Visit | Attending: Internal Medicine | Admitting: Internal Medicine

## 2015-09-15 DIAGNOSIS — Z1231 Encounter for screening mammogram for malignant neoplasm of breast: Secondary | ICD-10-CM | POA: Insufficient documentation

## 2015-12-07 ENCOUNTER — Ambulatory Visit: Payer: Medicare Other | Attending: Internal Medicine | Admitting: Physical Therapy

## 2015-12-07 ENCOUNTER — Encounter: Payer: Self-pay | Admitting: Physical Therapy

## 2015-12-07 DIAGNOSIS — R262 Difficulty in walking, not elsewhere classified: Secondary | ICD-10-CM | POA: Diagnosis present

## 2015-12-07 DIAGNOSIS — Z9181 History of falling: Secondary | ICD-10-CM | POA: Insufficient documentation

## 2015-12-07 DIAGNOSIS — M6281 Muscle weakness (generalized): Secondary | ICD-10-CM

## 2015-12-07 NOTE — Therapy (Signed)
[facility] [address] [place], [place], [address] Phone: [phone]   Fax:  [phone]  Physical Therapy Evaluation  Patient Details  Name: Ann Russell MRN: [id number] Date of Birth: 07-17-1940 Referring Provider: Idelle Crouch MD  Encounter Date: 12/07/2015      PT End of Session - 12/07/15 1625    Visit Number 1   Number of Visits 8   Date for PT Re-Evaluation 01/23/2016   Authorization Type g codes 1   PT Start Time [id number]   PT Stop Time 1354   PT Time Calculation (min) 75 min   Equipment Utilized During Treatment Gait belt   Activity Tolerance Patient tolerated treatment well   Behavior During Therapy [facility] for tasks assessed/performed      Past Medical History:  Diagnosis Date  . Arthritis    hands  . 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 Chronic kidney disease    .  Takes Vasotec for kidneys.  . Depression   . Hyperlipemia   . Hypertension   . Sleep apnea     Past Surgical History:  Procedure Laterality Date  . ABDOMINAL HYSTERECTOMY    . Buniectomy     bil  . CHOLECYSTECTOMY    . FACIAL COSMETIC SURGERY  2011  . GASTRIC BYPASS  2010  . Rotor cuff     right  . SCLERAL BUCKLE  11/07/2011   Procedure: SCLERAL BUCKLE;  Surgeon: Hayden Pedro, MD;  Location: [facility];  Service: Ophthalmology;  Laterality: Left;    There were no vitals filed for this visit.       Subjective Assessment - 12/07/15 1620    Subjective Pt states that she is seeking physical therapy to help her with balance and to prevent falls. States that she typically falls forward and feels as though she is not lifting her feet; feels that this may be due to decreased sensation in both feet which she has been dealing with for the last 2-3 years. Pt has long history of foot/ankle surgery to correct minor deformities including bunions and claw toes. She currently ambulates with SPC on R side.   Pertinent History Recurrent  falls.   Limitations Lifting;Standing;Walking;House hold activities   How long can you stand comfortably? <20 minutes   How long can you walk comfortably? <5 minutes   Patient Stated Goals patient would like to improve her balance so she doesn't fall and hurt herself   Currently in Pain? No/denies      Objective:  Therapeutic Exercise: Dorsiflexion seated AROM x10 BLE. Resisted dorsiflexion with yellow theraband BLE x10. Review of HEP with multiple dorsiflexion AROM/strengthening exercises. Ambulation in clinic with rollator 129ft.   Pt response for medical necessity: Pt demonstrates balance, LE strength and endurance deficits. She currently is most affected by decreased dorsiflexion strength in bilateral LE leading to recurrent falls. Pt will benefit from skilled PT to progress strengthening/balance program and promote safe ambulation/transfers with appropriate assistive device.       PT Education - 12/07/15 1624    Education provided Yes   Education Details HEP: dorsiflexion strengthening. Discussion of appropriate AD; encouraged use of rollator for safety.   Person(s) Educated Patient   Methods Explanation;Demonstration;Handout   Comprehension Verbalized understanding;Returned demonstration             PT Long Term Goals - 12/07/15 1634      PT LONG TERM GOAL #1  Title Pt will score >35/80 on LEFS to promote functional mobility.   Baseline 12-23-22: 23/80   Time 4   Period Weeks   Status New     PT LONG TERM GOAL #2   Title Pt will demonstrate improvement in LE strength per MMT by 1/2 grade to promote improved activity tolerance.   Baseline MMT R/L: hip flexion 4/4, knee ext 4+/4+, knee flexion 4-/4-, dorsiflexion 3+/3+   Time 4   Period Weeks   Status New     PT LONG TERM GOAL #3   Title Patient will score >45/56 on BERG assessment to promote increase standing/ambulating stability and decrease fall risk   Baseline 2022/12/23: 38/56   Time 4   Period Weeks   Status New      PT LONG TERM GOAL #4   Title Pt will be able to perform floor transfer independently so that she can feel safe with independent living    Baseline pt unable to get up from floor; fearful of not being able to get up   Time 4   Period Weeks   Status New     PT LONG TERM GOAL #5   Title Pt will be able to perform sit<>stand x5 with no UE support on chair of average height.   Baseline pt requires UE push off when rising from clinic chair   Time 4   Period Weeks   Status New            Plan - 12/23/15 1626    Clinical Impression Statement Pt is a pleasant 75 year old female referred to physical therapy to decrease fall frequency and address balance deficits. She has no c/o of pain except for in L calf which is tender to palpation; pt states that pain does not limit her function. Gait: Pt walks with SPC on R side with reciprocal gait. She demonstrates decreased foot clearance bilaterally; with compensatory hip flexion and hip ER more notably on RLE. She experiences no LOB when ambulating in clinic. Strength MMT: R/L hip flexion 4/4, knee extension 4+/4+, knee flexion 4-/4-, dorsiflexion 3+/3+, plantarflexion 5/5. Outcome Measures: LEFS 23/80, BERG: 38/56. Pt will benefit from skilled physical therapy to address balance deficits, progress LE strengthening and endurance program to promote longterm functional mobility.   Rehab Potential Poor   Clinical Impairments Affecting Rehab Potential Positive: Motivation. Negative: Sensation Loss, Age, Generalized Disuse   PT Frequency 2x / week   PT Duration 4 weeks   PT Treatment/Interventions ADLs/Self Care Home Management;Cryotherapy;Electrical Stimulation;Moist Heat;Gait training;Stair training;Functional mobility training;Therapeutic activities;Therapeutic exercise;Balance training;Neuromuscular re-education;Patient/family education;Manual techniques;Passive range of motion   PT Next Visit Plan Balance/LE strength program. Progress home program.  Address sit<>stand and floor transfer.   PT Home Exercise Plan see HEP in patient instructions.   Consulted and Agree with Plan of Care Patient      Patient will benefit from skilled therapeutic intervention in order to improve the following deficits and impairments:  Abnormal gait, Difficulty walking, Decreased endurance, Decreased activity tolerance, Pain, Decreased balance, Decreased mobility, Decreased strength, Impaired sensation, Dizziness  Visit Diagnosis: Difficulty in walking, not elsewhere classified  History of falling  Muscle weakness (generalized)      G-Codes - 12/23/15 1641    Functional Assessment Tool Used Clinical impression/ gait difficulty/ muscle weakness/ LEFS/ Berg balance   Functional Limitation Mobility: Walking and moving around   Mobility: Walking and Moving Around Current Status [id number]) At least 60 percent but less than 80 percent impaired, limited  or restricted   Mobility: Walking and Moving Around Goal Status [phone]) At least 40 percent but less than 60 percent impaired, limited or restricted       Problem List Patient Active Problem List   Diagnosis Date Noted  . 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   Pura Spice, PT, DPT # [phone] Derrill Memo, SPT 12/08/2015, 10:38 AM  [facility] [address] [place], [place], [address] Phone: [phone]   Fax:  [phone]  Name: Ann Russell MRN: [id number] Date of Birth: 03/09/1941

## 2015-12-09 ENCOUNTER — Encounter: Payer: Self-pay | Admitting: Physical Therapy

## 2015-12-09 ENCOUNTER — Ambulatory Visit: Payer: Medicare Other | Admitting: Physical Therapy

## 2015-12-09 DIAGNOSIS — R262 Difficulty in walking, not elsewhere classified: Secondary | ICD-10-CM | POA: Diagnosis not present

## 2015-12-09 DIAGNOSIS — M6281 Muscle weakness (generalized): Secondary | ICD-10-CM

## 2015-12-09 DIAGNOSIS — Z9181 History of falling: Secondary | ICD-10-CM

## 2015-12-09 NOTE — Therapy (Signed)
[facility] [address] [place], [place], [address] Phone: [phone]   Fax:  [phone]  Physical Therapy Treatment  Patient Details  Name: Ann Russell MRN: [id number] Date of Birth: 1940-07-25 Referring Provider: Idelle Crouch MD  Encounter Date: 12/09/2015      PT End of Session - 12/09/15 1627    Visit Number 2   Number of Visits 8   Date for PT Re-Evaluation 01/04/16   PT Start Time [id number]   PT Stop Time 1355   PT Time Calculation (min) 67 min   Equipment Utilized During Treatment Gait belt   Activity Tolerance Patient tolerated treatment well   Behavior During Therapy [facility] for tasks assessed/performed      Past Medical History:  Diagnosis Date  . Arthritis    hands  . 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 Chronic kidney disease    .  Takes Vasotec for kidneys.  . Depression   . Hyperlipemia   . Hypertension   . Sleep apnea     Past Surgical History:  Procedure Laterality Date  . ABDOMINAL HYSTERECTOMY    . Buniectomy     bil  . CHOLECYSTECTOMY    . FACIAL COSMETIC SURGERY  2011  . GASTRIC BYPASS  2010  . Rotor cuff     right  . SCLERAL BUCKLE  11/07/2011   Procedure: SCLERAL BUCKLE;  Surgeon: Hayden Pedro, MD;  Location: [place];  Service: Ophthalmology;  Laterality: Left;    There were no vitals filed for this visit.      Subjective Assessment - 12/09/15 1623    Subjective Pt states she has performed her HEP program since last appointment and has noticed an improvement in her dorsiflexion strength/ability for foot clearance. Reports no falls since last appt.   Pertinent History Recurrent falls.   Limitations Lifting;Standing;Walking;House hold activities   How long can you stand comfortably? <20 minutes   How long can you walk comfortably? <5 minutes   Patient Stated Goals patient would like to improve her balance so she doesn't fall and hurt herself   Currently in Pain?  No/denies      Objective:  Therapeutic Exercise: Nu Step 10 minutes; Level 4 (warm up). Seated resisted dorsiflexion/plantarflexion 2x10 bilateral LE. Lateral walks in // bars 42ftx4 with no UE support.  Gait: Amb in clinic with [place]. Adjusted height of cane for more functional gait pattern. Verbal cueing for reciprocal gait with advancement of RUE/LLE to increase base of support. Pt demonstrates appropriate foot clearance during gait practice today with minimal cueing.  Neuromuscular Re-ed: In // bars with UE support prn: Marching 30ftx6. Backwards walking 32ftx3. Marching with reciprocal UE diagonals 74ftx6. SLS RLE/LLE hip flex/ext x10 ea. Tandem walks with UE support prn 60ftx3.  Pt response for medical necessity: Pt demonstrates good tolerance of exercises this date, requiring one seated rest break. She shows quick progress with ambulation strategy with changes to SPC height.        PT Education - 12/09/15 1626    Education provided Yes   Education Details HEP: LE strengthening/stability program per patient instruction   Person(s) Educated Patient   Methods Explanation;Demonstration;Handout   Comprehension Verbalized understanding;Returned demonstration             PT Long Term Goals - 12/07/15 1634      PT LONG TERM GOAL #1   Title Pt will score >35/80 on  LEFS to promote functional mobility.   Baseline 8/15: 23/80   Time 4   Period Weeks   Status New     PT LONG TERM GOAL #2   Title Pt will demonstrate improvement in LE strength per MMT by 1/2 grade to promote improved activity tolerance.   Baseline MMT R/L: hip flexion 4/4, knee ext 4+/4+, knee flexion 4-/4-, dorsiflexion 3+/3+   Time 4   Period Weeks   Status New     PT LONG TERM GOAL #3   Title Patient will score >45/56 on BERG assessment to promote increase standing/ambulating stability and decrease fall risk   Baseline 8/15: 38/56   Time 4   Period Weeks   Status New     PT LONG TERM GOAL #4   Title Pt  will be able to perform floor transfer independently so that she can feel safe with independent living    Baseline pt unable to get up from floor; fearful of not being able to get up   Time 4   Period Weeks   Status New     PT LONG TERM GOAL #5   Title Pt will be able to perform sit<>stand x5 with no UE support on chair of average height.   Baseline pt requires UE push off when rising from clinic chair   Time 4   Period Weeks   Status New               Plan - 12/09/15 1628    Clinical Impression Statement Pt ambulating with SPC on RUE with tendency for cane advancement with RLE; tendency for use of cane in abducted position secondary to increased cane height. Pt with improved gait strategy with appropriate height cane; demonstrates ability to perform reciprocal strategy with minimal cueing. Pt with decreased plantarflexion/dorsiflexion strength; unable to perform full range heel raise.   Rehab Potential Good   Clinical Impairments Affecting Rehab Potential Positive: Motivation. Negative: Sensation Loss, Age, Generalized Disuse   PT Frequency 2x / week   PT Duration 4 weeks   PT Treatment/Interventions ADLs/Self Care Home Management;Cryotherapy;Electrical Stimulation;Moist Heat;Gait training;Stair training;Functional mobility training;Therapeutic activities;Therapeutic exercise;Balance training;Neuromuscular re-education;Patient/family education;Manual techniques;Passive range of motion   PT Next Visit Plan Balance/LE strength program. Progress home program. Address sit<>stand and floor transfer.   PT Home Exercise Plan see HEP in patient instructions.   Consulted and Agree with Plan of Care Patient      Patient will benefit from skilled therapeutic intervention in order to improve the following deficits and impairments:  Abnormal gait, Difficulty walking, Decreased endurance, Decreased activity tolerance, Pain, Decreased balance, Decreased mobility, Decreased strength, Impaired  sensation, Dizziness  Visit Diagnosis: Difficulty in walking, not elsewhere classified  History of falling  Muscle weakness (generalized)     Problem List Patient Active Problem List   Diagnosis Date Noted  . 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 [place], [place], [address] Phone: [phone]   Fax:  [phone]  Name: DEONE CIOLINO MRN: [id number] Date of Birth: 23-Apr-1941

## 2015-12-13 ENCOUNTER — Ambulatory Visit: Payer: Medicare Other | Admitting: Physical Therapy

## 2015-12-13 ENCOUNTER — Encounter: Payer: Self-pay | Admitting: Physical Therapy

## 2015-12-13 DIAGNOSIS — M6281 Muscle weakness (generalized): Secondary | ICD-10-CM

## 2015-12-13 DIAGNOSIS — R262 Difficulty in walking, not elsewhere classified: Secondary | ICD-10-CM

## 2015-12-13 DIAGNOSIS — Z9181 History of falling: Secondary | ICD-10-CM

## 2015-12-13 NOTE — Therapy (Signed)
[facility] [address]. [place], [place], [address] Phone: [phone]   Fax:  [phone]  Physical Therapy Treatment  Patient Details  Name: Ann Russell MRN: [id number] Date of Birth: 09/20/1940 Referring Provider: Idelle Crouch MD  Encounter Date: 12/13/2015      PT End of Session - 12/13/15 1726    Visit Number 3   Number of Visits 8   Date for PT Re-Evaluation 01/04/16   PT Start Time [id number]   PT Stop Time 1516   PT Time Calculation (min) 48 min   Equipment Utilized During Treatment Gait belt   Activity Tolerance Patient tolerated treatment well;Patient limited by fatigue   Behavior During Therapy [facility] for tasks assessed/performed      Past Medical History:  Diagnosis Date  . Arthritis    hands  . 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 Chronic kidney disease    .  Takes Vasotec for kidneys.  . Depression   . Hyperlipemia   . Hypertension   . Sleep apnea     Past Surgical History:  Procedure Laterality Date  . ABDOMINAL HYSTERECTOMY    . Buniectomy     bil  . CHOLECYSTECTOMY    . FACIAL COSMETIC SURGERY  2011  . GASTRIC BYPASS  2010  . Rotor cuff     right  . SCLERAL BUCKLE  11/07/2011   Procedure: SCLERAL BUCKLE;  Surgeon: Hayden Pedro, MD;  Location: [place];  Service: Ophthalmology;  Laterality: Left;    There were no vitals filed for this visit.      Subjective Assessment - 12/13/15 1725    Subjective Pt reports compliance with HEP, and no falls. States that she feels vast improvement from walking with newly adjusted SPC with decreased hip pain and overall instability.   Pertinent History Recurrent falls.   Limitations Lifting;Standing;Walking;House hold activities   How long can you stand comfortably? <20 minutes   How long can you walk comfortably? <5 minutes   Patient Stated Goals patient would like to improve her balance so she doesn't fall and hurt herself   Currently  in Pain? No/denies      Objective:  Therapeutic Exercise: NuStep Level 4, 10 minutes (no charge/warm up). Sit<>stand with raised seat x2 air ex pads. Pt unable to perform sit<>stand without UE assist; RUE assist x15. Step ups 2x10 leading with LLE/RLE; pt with decreased stability/security with LLE as stance leg; pt able to perform with unilateral UE assist but unable to achieve no UE assist at this session. Lateral walks 85ft x4 with no UE assist.  Neuromuscular re-ed: Modified grapevine (emphasis on forward cross over separate from backward cross over) 49ft x6. Pt shows increased difficulty with backwards cross over; requires bilateral UE assist and SPT SBA for safety. Partial tandem on air ex 1 minute x3.   Gait training: Pt ambulating on sidewalk, up/down curbs, uneven surfaces with SPC. Minimal cueing for reciprocal gait pattern. Pt demonstrates functional foot clearance with no instances of shuffling/scuffing. O2: 97, HR 91 following 9 minutes of ambulation. Pt requires seated rest break.  Pt response for medical necessity: Pt demonstrates overall improvement in gait strategy with improved foot clearance, reciprocal gait strategy and more consistent use of SPC. She continues to demonstrate moderate balance/coordination deficits with overall decreased plantarflexion and dorsiflexion strength.        PT Long Term Goals - 12/07/15 [id number]  PT LONG TERM GOAL #1   Title Pt will score >35/80 on LEFS to promote functional mobility.   Baseline 8/15: 23/80   Time 4   Period Weeks   Status New     PT LONG TERM GOAL #2   Title Pt will demonstrate improvement in LE strength per MMT by 1/2 grade to promote improved activity tolerance.   Baseline MMT R/L: hip flexion 4/4, knee ext 4+/4+, knee flexion 4-/4-, dorsiflexion 3+/3+   Time 4   Period Weeks   Status New     PT LONG TERM GOAL #3   Title Patient will score >45/56 on BERG assessment to promote increase standing/ambulating stability  and decrease fall risk   Baseline 8/15: 38/56   Time 4   Period Weeks   Status New     PT LONG TERM GOAL #4   Title Pt will be able to perform floor transfer independently so that she can feel safe with independent living    Baseline pt unable to get up from floor; fearful of not being able to get up   Time 4   Period Weeks   Status New     PT LONG TERM GOAL #5   Title Pt will be able to perform sit<>stand x5 with no UE support on chair of average height.   Baseline pt requires UE push off when rising from clinic chair   Time 4   Period Weeks   Status New               Plan - 12/13/15 1727    Clinical Impression Statement Pt continues to demonstrate limited strength in ankle plantarflexion with inability to perform full range heel raise. She does present with improved foot clearance during gait cycle with no instances of shuffling/scuffing. Pt with mild coordination deficits with reliance on UE during more complex tasks (modified grape vine in // bars).    Clinical Impairments Affecting Rehab Potential Positive: Motivation. Negative: Sensation Loss, Age, Generalized Disuse   PT Frequency 2x / week   PT Duration 4 weeks   PT Treatment/Interventions ADLs/Self Care Home Management;Cryotherapy;Electrical Stimulation;Moist Heat;Gait training;Stair training;Functional mobility training;Therapeutic activities;Therapeutic exercise;Balance training;Neuromuscular re-education;Patient/family education;Manual techniques;Passive range of motion   PT Next Visit Plan Balance/LE strength program. Progress home program. Address sit<>stand and floor transfer.   PT Home Exercise Plan see HEP in patient instructions.   Consulted and Agree with Plan of Care Patient      Patient will benefit from skilled therapeutic intervention in order to improve the following deficits and impairments:  Abnormal gait, Difficulty walking, Decreased endurance, Decreased activity tolerance, Pain, Decreased balance,  Decreased mobility, Decreased strength, Impaired sensation, Dizziness  Visit Diagnosis: Difficulty in walking, not elsewhere classified  Muscle weakness (generalized)  History of falling     Problem List Patient Active Problem List   Diagnosis Date Noted  . 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 [place], [place], [address] Phone: [phone]   Fax:  [phone]  Name: REBERTA WEITZEL MRN: [id number] Date of Birth: 11/01/1940

## 2015-12-15 ENCOUNTER — Encounter: Payer: Self-pay | Admitting: Physical Therapy

## 2015-12-15 ENCOUNTER — Ambulatory Visit: Payer: Medicare Other | Admitting: Physical Therapy

## 2015-12-15 DIAGNOSIS — Z9181 History of falling: Secondary | ICD-10-CM

## 2015-12-15 DIAGNOSIS — R262 Difficulty in walking, not elsewhere classified: Secondary | ICD-10-CM

## 2015-12-15 DIAGNOSIS — M6281 Muscle weakness (generalized): Secondary | ICD-10-CM

## 2015-12-15 NOTE — Therapy (Signed)
[facility] [address] [place], [place], [address] Phone: [phone]   Fax:  [phone]  Physical Therapy Treatment  Patient Details  Name: Ann Russell MRN: [id number] Date of Birth: Sep 19, 1940 Referring Provider: Idelle Crouch MD  Encounter Date: 12/15/2015      PT End of Session - 12/15/15 1700    Visit Number 4   Number of Visits 8   Date for PT Re-Evaluation 01/04/16   PT Start Time [id number]   PT Stop Time 1515   PT Time Calculation (min) 59 min   Equipment Utilized During Treatment Gait belt   Activity Tolerance Patient tolerated treatment well;Patient limited by fatigue   Behavior During Therapy [facility] for tasks assessed/performed      Past Medical History:  Diagnosis Date  . Arthritis    hands  . 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 Chronic kidney disease    .  Takes Vasotec for kidneys.  . Depression   . Hyperlipemia   . Hypertension   . Sleep apnea     Past Surgical History:  Procedure Laterality Date  . ABDOMINAL HYSTERECTOMY    . Buniectomy     bil  . CHOLECYSTECTOMY    . FACIAL COSMETIC SURGERY  2011  . GASTRIC BYPASS  2010  . Rotor cuff     right  . SCLERAL BUCKLE  11/07/2011   Procedure: SCLERAL BUCKLE;  Surgeon: Hayden Pedro, MD;  Location: [place];  Service: Ophthalmology;  Laterality: Left;    There were no vitals filed for this visit.      Subjective Assessment - 12/15/15 1658    Subjective Pt states she has been performing her HEP regularly, states she is experiencing more consistent foot clearance after performing resisted DF/PF exercises. Pt feels that she would benefit from use of a rollator; states she feels more stable with better gait pattern.   Pertinent History Recurrent falls.   Limitations Lifting;Standing;Walking;House hold activities   How long can you stand comfortably? <20 minutes   How long can you walk comfortably? <5 minutes   Patient Stated Goals  patient would like to improve her balance so she doesn't fall and hurt herself   Currently in Pain? No/denies      Objective:  Therapeutic Exercise: Sit<>stand with height of 22in with no UE support x10. Floor transfer x1 pt with reliance on UE support, trunk rotation and difficulty achieving safe mechanics with verbal cueing secondary to weakness.   Neuromuscular Re-ed: tandem walking in // bars forwards/backwards 79ft x6 with light UE support prn. Lateral walking in // bars 70ft x4 with no UE support. Tandem balance with light UE support prn - progressing to no UE support 6x30 sec. Narrow base of support 1 minute x4. Narrow base of support on air ex pad 1 minute x4. SLS with hip flex/ext 2x10 BLE with light touch on // bars.  Pt response for medical necessity: Pt demonstrates good tolerance of cont. Standing exercise this therapy session with one required seated rest beak after 30 minutes. She ambulates with improved gait strategy with modifications to [facility] and DF/PF exercises but performs more consistent gait with self reported increased stability/confidence when ambulating with a rollator.        PT Long Term Goals - 12/07/15 1634      PT LONG TERM GOAL #1   Title Pt will score >35/80 on LEFS to promote functional mobility.  Baseline 8/15: 23/80   Time 4   Period Weeks   Status New     PT LONG TERM GOAL #2   Title Pt will demonstrate improvement in LE strength per MMT by 1/2 grade to promote improved activity tolerance.   Baseline MMT R/L: hip flexion 4/4, knee ext 4+/4+, knee flexion 4-/4-, dorsiflexion 3+/3+   Time 4   Period Weeks   Status New     PT LONG TERM GOAL #3   Title Patient will score >45/56 on BERG assessment to promote increase standing/ambulating stability and decrease fall risk   Baseline 8/15: 38/56   Time 4   Period Weeks   Status New     PT LONG TERM GOAL #4   Title Pt will be able to perform floor transfer independently so that she can feel safe with  independent living    Baseline pt unable to get up from floor; fearful of not being able to get up   Time 4   Period Weeks   Status New     PT LONG TERM GOAL #5   Title Pt will be able to perform sit<>stand x5 with no UE support on chair of average height.   Baseline pt requires UE push off when rising from clinic chair   Time 4   Period Weeks   Status New               Plan - 12/15/15 1700    Clinical Impression Statement Pt demonstrates difficulty with floor transfer and sit<>stand without UE support. She has most difficulty with deep hip/knee flexion activities and will benefit from cont. practice to promote safety. Pt demonstrates mild progress in overall balance and endurance. She is able to perform approx 30 minutes of continued standing exercise before requiring a seated rest break this therapy session.    Rehab Potential Good   Clinical Impairments Affecting Rehab Potential Positive: Motivation. Negative: Sensation Loss, Age, Generalized Disuse   PT Frequency 2x / week   PT Duration 4 weeks   PT Treatment/Interventions ADLs/Self Care Home Management;Cryotherapy;Electrical Stimulation;Moist Heat;Gait training;Stair training;Functional mobility training;Therapeutic activities;Therapeutic exercise;Balance training;Neuromuscular re-education;Patient/family education;Manual techniques;Passive range of motion   PT Next Visit Plan Balance/LE strength program. Progress home program. Address sit<>stand and floor transfer (progression exercises to promote confidence/strength gains)   PT Home Exercise Plan see HEP in patient instructions.   Consulted and Agree with Plan of Care Patient      Patient will benefit from skilled therapeutic intervention in order to improve the following deficits and impairments:  Abnormal gait, Difficulty walking, Decreased endurance, Decreased activity tolerance, Pain, Decreased balance, Decreased mobility, Decreased strength, Impaired sensation,  Dizziness  Visit Diagnosis: Difficulty in walking, not elsewhere classified  Muscle weakness (generalized)  History of falling     Problem List Patient Active Problem List   Diagnosis Date Noted  . 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, [place], [address] Phone: [phone]   Fax:  [phone]  Name: RAKYIA MULLENIX MRN: [id number] Date of Birth: 26-Feb-1941

## 2015-12-20 ENCOUNTER — Ambulatory Visit: Payer: Medicare Other | Admitting: Physical Therapy

## 2015-12-20 DIAGNOSIS — M6281 Muscle weakness (generalized): Secondary | ICD-10-CM

## 2015-12-20 DIAGNOSIS — Z9181 History of falling: Secondary | ICD-10-CM

## 2015-12-20 DIAGNOSIS — R262 Difficulty in walking, not elsewhere classified: Secondary | ICD-10-CM

## 2015-12-20 NOTE — Therapy (Signed)
[place] [facility] [address] [place], [place], [address] Phone: [phone]   Fax:  [phone]  Physical Therapy Treatment  Patient Details  Name: Ann Russell MRN: [id number] Date of Birth: 1940/08/14 Referring Provider: Idelle Crouch MD  Encounter Date: 12/20/2015      PT End of Session - 12/20/15 1423    Visit Number 5   Number of Visits 8   Date for PT Re-Evaluation Jan 10, 2016   Authorization Type g codes 1   PT Start Time [id number]   PT Stop Time 1505   PT Time Calculation (min) 48 min   Equipment Utilized During Treatment Gait belt   Activity Tolerance Patient tolerated treatment well;Patient limited by fatigue   Behavior During Therapy [facility] for tasks assessed/performed      Past Medical History:  Diagnosis Date  . Arthritis    hands  . 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 Chronic kidney disease    .  Takes Vasotec for kidneys.  . Depression   . Hyperlipemia   . Hypertension   . Sleep apnea     Past Surgical History:  Procedure Laterality Date  . ABDOMINAL HYSTERECTOMY    . Buniectomy     bil  . CHOLECYSTECTOMY    . FACIAL COSMETIC SURGERY  2011  . GASTRIC BYPASS  2010  . Rotor cuff     right  . SCLERAL BUCKLE  11/07/2011   Procedure: SCLERAL BUCKLE;  Surgeon: Hayden Pedro, MD;  Location: [place];  Service: Ophthalmology;  Laterality: Left;    There were no vitals filed for this visit.      Subjective Assessment - 12/20/15 1414    Subjective Pt. states she is doing alright.  No pain and no falls reported.  Pt. brought in extra cane to have properly sized.     Pertinent History Recurrent falls.   Limitations Lifting;Standing;Walking;House hold activities   How long can you stand comfortably? <20 minutes   How long can you walk comfortably? <5 minutes   Patient Stated Goals patient would like to improve her balance so she doesn't fall and hurt herself   Currently in Pain? No/denies        Objective:  Therapeutic Exercise:  Nustep L6 12 min. B UE/LE (warm-up/no charge).  Sit<>stand on outside bench with assist of SPC/ 1 arm rest 3x (mod. Verbal cuing).  3" step L/R knee partial lunges (short holds)- 15x2 each.  Step overs/ marching in //-bars with min. To no UE assist 10x (cuing for posture/ head position with mirror feedback).        Neuromuscular Re-ed:  Walking outside on uneven terrain/ grass/ up and down curbs with HHA/CGA for safety.  Tandem stance/walking in //-bars forwards/backwards 103ft x6 with light UE support prn. Lateral walking in // bars 54ft x4 with no UE support. Tandem balance with light UE support prn - progressing to no UE support 6x30 sec.  Walking outside to car with focus on consistent heel strike over changes in surfaces/ up and down curb and ramp with use of SPC.    Pt response for medical necessity: Pt demonstrates good tolerance of cont. Standing exercise this therapy session with one required seated rest beak after 30 minutes. She ambulates with improved gait strategy with modifications to [facility] and DF/PF exercises but performs more consistent gait with self reported increased stability/confidence when ambulating with a rollator.  PT Long Term Goals - 12/07/15 1634      PT LONG TERM GOAL #1   Title Pt will score >35/80 on LEFS to promote functional mobility.   Baseline 8/15: 23/80   Time 4   Period Weeks   Status New     PT LONG TERM GOAL #2   Title Pt will demonstrate improvement in LE strength per MMT by 1/2 grade to promote improved activity tolerance.   Baseline MMT R/L: hip flexion 4/4, knee ext 4+/4+, knee flexion 4-/4-, dorsiflexion 3+/3+   Time 4   Period Weeks   Status New     PT LONG TERM GOAL #3   Title Patient will score >45/56 on BERG assessment to promote increase standing/ambulating stability and decrease fall risk   Baseline 8/15: 38/56   Time 4   Period Weeks   Status New     PT LONG TERM GOAL #4   Title  Pt will be able to perform floor transfer independently so that she can feel safe with independent living    Baseline pt unable to get up from floor; fearful of not being able to get up   Time 4   Period Weeks   Status New     PT LONG TERM GOAL #5   Title Pt will be able to perform sit<>stand x5 with no UE support on chair of average height.   Baseline pt requires UE push off when rising from clinic chair   Time 4   Period Weeks   Status New           Plan - 12/20/15 1424    Clinical Impression Statement Pt. requires HHA/CGA with step ups/downs on outside curbs with use of SPC.  Pts. B LE easily fatigued with standing ther.ex./ balance activities.  Extra time and multiple attempts to stand from low chairs/ outside bench wtih safe and proper technique.  Pt. will continue to benefit from generalized LE strengthening ex. program/ posture correction and gait/balance activities to decrease fall risk/ promote independence.     Rehab Potential Good   Clinical Impairments Affecting Rehab Potential Positive: Motivation. Negative: Sensation Loss, Age, Generalized Disuse   PT Frequency 2x / week   PT Duration 4 weeks   PT Treatment/Interventions ADLs/Self Care Home Management;Cryotherapy;Electrical Stimulation;Moist Heat;Gait training;Stair training;Functional mobility training;Therapeutic activities;Therapeutic exercise;Balance training;Neuromuscular re-education;Patient/family education;Manual techniques;Passive range of motion   PT Next Visit Plan Balance/LE strength program. Progress home program. Address sit<>stand and floor transfer (progression exercises to promote confidence/strength gains)   PT Home Exercise Plan see HEP in patient instructions.   Consulted and Agree with Plan of Care Patient      Patient will benefit from skilled therapeutic intervention in order to improve the following deficits and impairments:  Abnormal gait, Difficulty walking, Decreased endurance, Decreased  activity tolerance, Pain, Decreased balance, Decreased mobility, Decreased strength, Impaired sensation, Dizziness  Visit Diagnosis: Difficulty in walking, not elsewhere classified  Muscle weakness (generalized)  History of falling  Difficulty walking     Problem List Patient Active Problem List   Diagnosis Date Noted  . 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 [place], [place], [address] Phone: [phone]   Fax:  [phone]  Name: Ann Russell MRN: [id number] Date of Birth: Mar 25, 1941

## 2015-12-22 ENCOUNTER — Encounter: Payer: Self-pay | Admitting: Physical Therapy

## 2015-12-22 ENCOUNTER — Ambulatory Visit: Payer: Medicare Other | Admitting: Physical Therapy

## 2015-12-22 DIAGNOSIS — M6281 Muscle weakness (generalized): Secondary | ICD-10-CM

## 2015-12-22 DIAGNOSIS — R262 Difficulty in walking, not elsewhere classified: Secondary | ICD-10-CM

## 2015-12-22 DIAGNOSIS — Z9181 History of falling: Secondary | ICD-10-CM

## 2015-12-22 NOTE — Therapy (Signed)
[facility] [facility] [address] [place], [place], [address] Phone: [phone]   Fax:  [phone]  Physical Therapy Treatment  Patient Details  Name: Ann Russell MRN: [id number] Date of Birth: 1940/11/24 Referring Provider: Idelle Crouch MD  Encounter Date: 12/22/2015      PT End of Session - 12/22/15 1437    Visit Number 6   Number of Visits 8   Date for PT Re-Evaluation 01/04/16   PT Start Time [id number]   PT Stop Time 1515   PT Time Calculation (min) 55 min   Equipment Utilized During Treatment Gait belt   Activity Tolerance Patient tolerated treatment well;Patient limited by fatigue   Behavior During Therapy [facility] for tasks assessed/performed      Past Medical History:  Diagnosis Date  . Arthritis    hands  . 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 Chronic kidney disease    .  Takes Vasotec for kidneys.  . Depression   . Hyperlipemia   . Hypertension   . Sleep apnea     Past Surgical History:  Procedure Laterality Date  . ABDOMINAL HYSTERECTOMY    . Buniectomy     bil  . CHOLECYSTECTOMY    . FACIAL COSMETIC SURGERY  2011  . GASTRIC BYPASS  2010  . Rotor cuff     right  . SCLERAL BUCKLE  11/07/2011   Procedure: SCLERAL BUCKLE;  Surgeon: Hayden Pedro, MD;  Location: [place];  Service: Ophthalmology;  Laterality: Left;    There were no vitals filed for this visit.      Subjective Assessment - 12/22/15 1436    Subjective Pt. reports legs are tired and has really benefited from adjustment in SPC height.  No falls reported.     Pertinent History Recurrent falls.   Limitations Lifting;Standing;Walking;House hold activities   How long can you stand comfortably? <20 minutes   How long can you walk comfortably? <5 minutes   Patient Stated Goals patient would like to improve her balance so she doesn't fall and hurt herself   Currently in Pain? No/denies      Objective:  Therapeutic Exercise:   Nustep L5 12 min. B UE/LE (warm-up/no charge).  6" step ups/ downswith and without Airex pad (1 UE assist for balance/ safety).  See new HEP (handouts provided).     Neuromuscular Re-ed: Tandem stance/walking in //-bars forwards/backwards 23ft x6 with light UE support prn. Lateral walking in // bars 93ft x4 with no UE support. Tandem balance with light UE support prn - progressing to no UE support 6x30 sec.  Walking outside to car with focus on consistent heel strike over changes in surfaces/ up and down curb and ramp with use of SPC.  Functional reaching with cones/ toe and heel taps on 6" step with CGA for safety (in //-bars).      Pt response for medical necessity:  She continues to ambulate with improved gait strategy with modifications to [facility] and DF/PF exercises but performs more consistent gait with self reported increased stability/confidence when ambulating with a rollator.       PT Long Term Goals - 12/07/15 1634      PT LONG TERM GOAL #1   Title Pt will score >35/80 on LEFS to promote functional mobility.   Baseline 8/15: 23/80   Time 4   Period Weeks   Status New     PT LONG TERM  GOAL #2   Title Pt will demonstrate improvement in LE strength per MMT by 1/2 grade to promote improved activity tolerance.   Baseline MMT R/L: hip flexion 4/4, knee ext 4+/4+, knee flexion 4-/4-, dorsiflexion 3+/3+   Time 4   Period Weeks   Status New     PT LONG TERM GOAL #3   Title Patient will score >45/56 on BERG assessment to promote increase standing/ambulating stability and decrease fall risk   Baseline 8/15: 38/56   Time 4   Period Weeks   Status New     PT LONG TERM GOAL #4   Title Pt will be able to perform floor transfer independently so that she can feel safe with independent living    Baseline pt unable to get up from floor; fearful of not being able to get up   Time 4   Period Weeks   Status New     PT LONG TERM GOAL #5   Title Pt will be able to perform sit<>stand x5 with  no UE support on chair of average height.   Baseline pt requires UE push off when rising from clinic chair   Time 4   Period Weeks   Status New            Plan - 12/22/15 1439    Clinical Impression Statement Pt. remains hesitant with step ups/downs on outside surfaces (esp. curbs/ grassy terrain).  Difficulty with sit to stands from stand chair surfaces.  Pt. requires UE assist and several attempts to complete with proper technique.  Pt. will continue to benefit from continued LE strengthening/ balance tasks/ proprioception tasks.    Rehab Potential Good   Clinical Impairments Affecting Rehab Potential Positive: Motivation. Negative: Sensation Loss, Age, Generalized Disuse   PT Frequency 2x / week   PT Duration 4 weeks   PT Treatment/Interventions ADLs/Self Care Home Management;Cryotherapy;Electrical Stimulation;Moist Heat;Gait training;Stair training;Functional mobility training;Therapeutic activities;Therapeutic exercise;Balance training;Neuromuscular re-education;Patient/family education;Manual techniques;Passive range of motion   PT Next Visit Plan Balance/LE strength program. Progress home program. Address sit<>stand and floor transfer (progression exercises to promote confidence/strength gains)   PT Home Exercise Plan see HEP in patient instructions.   Consulted and Agree with Plan of Care Patient      Patient will benefit from skilled therapeutic intervention in order to improve the following deficits and impairments:  Abnormal gait, Difficulty walking, Decreased endurance, Decreased activity tolerance, Pain, Decreased balance, Decreased mobility, Decreased strength, Impaired sensation, Dizziness  Visit Diagnosis: Difficulty in walking, not elsewhere classified  Muscle weakness (generalized)  History of falling  Difficulty walking     Problem List Patient Active Problem List   Diagnosis Date Noted  . 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

## 2015-12-28 ENCOUNTER — Encounter: Payer: Self-pay | Admitting: Physical Therapy

## 2015-12-28 ENCOUNTER — Ambulatory Visit: Payer: Medicare Other | Attending: Internal Medicine | Admitting: Physical Therapy

## 2015-12-28 DIAGNOSIS — M6281 Muscle weakness (generalized): Secondary | ICD-10-CM | POA: Insufficient documentation

## 2015-12-28 DIAGNOSIS — Z9181 History of falling: Secondary | ICD-10-CM | POA: Diagnosis present

## 2015-12-28 DIAGNOSIS — R262 Difficulty in walking, not elsewhere classified: Secondary | ICD-10-CM | POA: Diagnosis present

## 2015-12-28 NOTE — Therapy (Signed)
[place] [facility] [address] [place], [place], [address] Phone: [phone]   Fax:  [phone]  Physical Therapy Treatment  Patient Details  Name: Ann Russell MRN: [id number] Date of Birth: 07/03/1940 Referring Provider: Idelle Crouch MD  Encounter Date: 12/28/2015      PT End of Session - 12/28/15 1426    Visit Number 7   Number of Visits 8   Date for PT Re-Evaluation 2016-01-31   Authorization Type g codes 1   PT Start Time August 11, 1417   PT Stop Time 1520   PT Time Calculation (min) 61 min   Equipment Utilized During Treatment Gait belt   Activity Tolerance Patient tolerated treatment well;Patient limited by fatigue   Behavior During Therapy [facility] for tasks assessed/performed      Past Medical History:  Diagnosis Date  . Arthritis    hands  . 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 Chronic kidney disease    .  Takes Vasotec for kidneys.  . Depression   . Hyperlipemia   . Hypertension   . Sleep apnea     Past Surgical History:  Procedure Laterality Date  . ABDOMINAL HYSTERECTOMY    . Buniectomy     bil  . CHOLECYSTECTOMY    . FACIAL COSMETIC SURGERY  Aug 11, 2009  . GASTRIC BYPASS  08-11-2008  . Rotor cuff     right  . SCLERAL BUCKLE  11/07/2011   Procedure: SCLERAL BUCKLE;  Surgeon: Hayden Pedro, MD;  Location: [place];  Service: Ophthalmology;  Laterality: Left;    There were no vitals filed for this visit.      Subjective Assessment - 12/28/15 1426    Subjective Pt. reports no new complaints.  Pt. reports she is getting stronger and entered PT with use of new rollator.     Pertinent History Recurrent falls.   Limitations Lifting;Standing;Walking;House hold activities   How long can you stand comfortably? <20 minutes   How long can you walk comfortably? <5 minutes   Patient Stated Goals patient would like to improve her balance so she doesn't fall and hurt herself   Currently in Pain? No/denies     1  more tx. Session and then decrease frequency to 1x/week x 2 weeks for 1 month.     Objective:  Therapeutic Exercise: Nustep L5 12 min. B UE/LE (warm-up/no charge).   Stair training with SPC (step to gait pattern) with SPC/ 1 handrail on L with ascending and R with descending). 6" step ups/ downswith and without Airex pad (1 UE assist for balance/ safety).  2.5# seated LAQ/ standing marching (forwards/ backwards)/ lateral stepping.   Neuromuscular Re-ed: SLS (L/R) with 2.5# ankle wts.  Tandem stance/walking in //-bars forwards/backwards 12 ft x6 with light UE support prn.  Lateral cone step overs in //-bars 4x.  Functional reaching with cones/ toe and heel taps on 6" step with CGA for safety (in //-bars).     Gait training: instruction with use of pts. New rollator in clinic with cuing for step pattern/ heel strike/ upright posture.    Pt response for medical necessity:  She continues to ambulate with improved gait strategy with modifications to [facility] and DF/PF exercises but performs more consistent gait with self reported increased stability/confidence when ambulating with a rollator.       PT Long Term Goals - 12/07/15 1634      PT LONG TERM GOAL #1  Title Pt will score >35/80 on LEFS to promote functional mobility.   Baseline 8/15: 23/80   Time 4   Period Weeks   Status New     PT LONG TERM GOAL #2   Title Pt will demonstrate improvement in LE strength per MMT by 1/2 grade to promote improved activity tolerance.   Baseline MMT R/L: hip flexion 4/4, knee ext 4+/4+, knee flexion 4-/4-, dorsiflexion 3+/3+   Time 4   Period Weeks   Status New     PT LONG TERM GOAL #3   Title Patient will score >45/56 on BERG assessment to promote increase standing/ambulating stability and decrease fall risk   Baseline 8/15: 38/56   Time 4   Period Weeks   Status New     PT LONG TERM GOAL #4   Title Pt will be able to perform floor transfer independently so that she can feel safe with  independent living    Baseline pt unable to get up from floor; fearful of not being able to get up   Time 4   Period Weeks   Status New     PT LONG TERM GOAL #5   Title Pt will be able to perform sit<>stand x5 with no UE support on chair of average height.   Baseline pt requires UE push off when rising from clinic chair   Time 4   Period Weeks   Status New            Plan - 12/28/15 1427    Clinical Impression Statement Pt. ambulates with consistent gait pattern with improved cadence/ posture and confidence with use of rollator.  Pt. benefits from use of rollator with outside surfaces/ walking increase distances.  Pt. able to safely ambulate short distances around home with SPC and 2-point gait pattern.  Pt. will occasionally require a verbal cue to increase hip/knee flexion to prevent shuffling gait/ decrease heel strike.  Pt. will continue to benefit from LE strength training at PT with more of a home exercise focus (decrease frequency).     Clinical Impairments Affecting Rehab Potential Positive: Motivation. Negative: Sensation Loss, Age, Generalized Disuse   PT Frequency 2x / week   PT Treatment/Interventions ADLs/Self Care Home Management;Cryotherapy;Electrical Stimulation;Moist Heat;Gait training;Stair training;Functional mobility training;Therapeutic activities;Therapeutic exercise;Balance training;Neuromuscular re-education;Patient/family education;Manual techniques;Passive range of motion   PT Next Visit Plan Balance/LE strength program. Progress home program. Address sit<>stand and floor transfer (progression exercises to promote confidence/strength gains)   PT Home Exercise Plan see HEP in patient instructions.   Consulted and Agree with Plan of Care Patient      Patient will benefit from skilled therapeutic intervention in order to improve the following deficits and impairments:  Abnormal gait, Difficulty walking, Decreased endurance, Decreased activity tolerance, Pain,  Decreased balance, Decreased mobility, Decreased strength, Impaired sensation, Dizziness  Visit Diagnosis: Difficulty in walking, not elsewhere classified  Muscle weakness (generalized)  History of falling  Difficulty walking     Problem List Patient Active Problem List   Diagnosis Date Noted  . 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 [place], [place], [address] Phone: [phone]   Fax:  [phone]  Name: ANYIAH OLLIS MRN: [id number] Date of Birth: 25-Jul-1940

## 2015-12-30 ENCOUNTER — Encounter: Payer: Self-pay | Admitting: Physical Therapy

## 2015-12-30 ENCOUNTER — Ambulatory Visit: Payer: Medicare Other | Admitting: Physical Therapy

## 2015-12-30 DIAGNOSIS — R262 Difficulty in walking, not elsewhere classified: Secondary | ICD-10-CM

## 2015-12-30 DIAGNOSIS — M6281 Muscle weakness (generalized): Secondary | ICD-10-CM

## 2015-12-30 DIAGNOSIS — Z9181 History of falling: Secondary | ICD-10-CM

## 2015-12-30 NOTE — Therapy (Signed)
[facility] [address] [place], [place], [address] Phone: [phone]   Fax:  [phone]  Physical Therapy Treatment  Patient Details  Name: Ann Russell MRN: [id number] Date of Birth: 02-07-1941 Referring Provider: Idelle Crouch MD  Encounter Date: 12/30/2015      PT End of Session - 12/30/15 1608    Visit Number 8   Number of Visits 11   Date for PT Re-Evaluation 21-Feb-2016   Authorization Type g codes 1   PT Start Time 1427   PT Stop Time 1516   PT Time Calculation (min) 49 min   Equipment Utilized During Treatment Gait belt   Activity Tolerance Patient tolerated treatment well;Patient limited by fatigue   Behavior During Therapy [facility] for tasks assessed/performed      Past Medical History:  Diagnosis Date  . Arthritis    hands  . 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 Chronic kidney disease    .  Takes Vasotec for kidneys.  . Depression   . Hyperlipemia   . Hypertension   . Sleep apnea     Past Surgical History:  Procedure Laterality Date  . ABDOMINAL HYSTERECTOMY    . Buniectomy     bil  . CHOLECYSTECTOMY    . FACIAL COSMETIC SURGERY  2011  . GASTRIC BYPASS  2010  . Rotor cuff     right  . SCLERAL BUCKLE  11/07/2011   Procedure: SCLERAL BUCKLE;  Surgeon: Hayden Pedro, MD;  Location: [place];  Service: Ophthalmology;  Laterality: Left;    There were no vitals filed for this visit.      Subjective Assessment - 12/30/15 1606    Subjective Pt reports she is using her rollator regularly and feels that she is walking safer and more upright. States that she is performing her HEP, is able to perform 2 sets of sit<>stand with 22in set but fatigues before 3 sets of 10.   Pertinent History Recurrent falls.   Limitations Lifting;Standing;Walking;House hold activities   How long can you stand comfortably? <20 minutes   How long can you walk comfortably? <5 minutes   Patient Stated Goals  patient would like to improve her balance so she doesn't fall and hurt herself   Currently in Pain? No/denies     Objective:  Therapeutic Exercise: Discussed HEP including new exercises: Sit<>stand x10 2 air ex pad. Wall slide 3x10. Partial lunge 3x10 in // bars with UE support prn. Partial lunge 2x10 with 6'' step with UE support prn.   Neuromuscular Re-ed: In // bars: Static balance with narrow base of support 1 minute. Tandem stance 30 sec trials (pt able to achieve 1 30 sec trial; multiple 15 sec trials with RLE as posterior limb). 6'' step taps reciprocal LE UE support prn (light touch) 3 minutes.   Pt response for medical necessity: Pt with good tolerance of closed chain exercises; pt experiences mild ache in bilateral knees with wall slide - improved tolerance with increased emphasis on hip hinge. She has progressed well through balance/strengthening program but continues to struggle with functional tasks involving deep hip/knee flexion (sit<>stand/floor transfer) and will benefit from continued physical therapy with emphasis on home program to progress strengthening.       PT Education - 12/30/15 1608    Education provided Yes   Education Details HEP: partial lunge, wall slide, sit<>stand   Person(s) Educated Patient   Methods Explanation;Demonstration;Handout  Comprehension Verbalized understanding;Returned demonstration             PT Long Term Goals - 2015/12/31 1616      PT LONG TERM GOAL #1   Title Pt will score >35/80 on LEFS to promote functional mobility.   Baseline 8/15: 23/80. 9/6: 37/80   Time 4   Period Weeks   Status Achieved     PT LONG TERM GOAL #2   Title Pt will demonstrate improvement in LE strength per MMT by 1/2 grade to promote improved activity tolerance.   Baseline MMT R/L: hip flexion 4/4, knee ext 4+/4+, knee flexion 4-/4-, dorsiflexion 3+/3+   Time 4   Period Weeks   Status Partially Met     PT LONG TERM GOAL #3   Title Patient will score  >45/56 on BERG assessment to promote increase standing/ambulating stability and decrease fall risk   Baseline 8/15: 38/56. 9/6: 43/56   Time 4   Period Weeks   Status Partially Met     PT LONG TERM GOAL #4   Title Pt will be able to perform floor transfer independently so that she can feel safe with independent living    Baseline pt unable to get up from floor; fearful of not being able to get up. 9/6: pt able to perform partial lunge with confidence   Time 4   Period Weeks   Status Partially Met     PT LONG TERM GOAL #5   Title Pt will be able to perform sit<>stand x5 with no UE support on chair of average height.   Baseline pt requires UE push off when rising from clinic chair 9/6: pt able to perform sit<>stand on 22in height   Time 4   Period Weeks   Status Partially Met               Plan - 31-Dec-2015 1609    Clinical Impression Statement Pt demonstrates improvement in functional mobility LEFS 37/80 and balance BERG 43/56. She is using her rollator for long distance ambulation and demonstrates more upright posture with its use. She demonstrates early onset fatigue with partial lunge/wall slide in bilat quadriceps. Pt will benefit from continued physical therapy with decreased frequency to progress home exercise program and promote continued activity/decrease fall risk.   Rehab Potential Good   Clinical Impairments Affecting Rehab Potential Positive: Motivation. Negative: Sensation Loss, Age, Generalized Disuse   PT Frequency 2x / week   PT Duration 4 weeks   PT Treatment/Interventions ADLs/Self Care Home Management;Cryotherapy;Electrical Stimulation;Moist Heat;Gait training;Stair training;Functional mobility training;Therapeutic activities;Therapeutic exercise;Balance training;Neuromuscular re-education;Patient/family education;Manual techniques;Passive range of motion   PT Next Visit Plan Balance/LE strength program. Progress home program. Address sit<>stand and floor transfer  (progression exercises to promote confidence/strength gains)   PT Home Exercise Plan see HEP in patient instructions.   Consulted and Agree with Plan of Care Patient      Patient will benefit from skilled therapeutic intervention in order to improve the following deficits and impairments:  Abnormal gait, Difficulty walking, Decreased endurance, Decreased activity tolerance, Pain, Decreased balance, Decreased mobility, Decreased strength, Impaired sensation, Dizziness  Visit Diagnosis: Difficulty in walking, not elsewhere classified  Muscle weakness (generalized)  History of falling  Difficulty walking       G-Codes - December 31, 2015 1409    Functional Assessment Tool Used Clinical impression/ gait difficulty/ muscle weakness/ LEFS/ Berg balance   Functional Limitation Mobility: Walking and moving around   Mobility: Walking and Moving Around Current Status [phone])  At least 40 percent but less than 60 percent impaired, limited or restricted   Mobility: Walking and Moving Around Goal Status [phone]) At least 20 percent but less than 40 percent impaired, limited or restricted      Problem List Patient Active Problem List   Diagnosis Date Noted  . 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 [address] [place], [place], [address] Phone: [phone]   Fax:  [phone]  Name: DAYANI WINBUSH MRN: [id number] Date of Birth: Nov 19, 1940

## 2016-01-11 ENCOUNTER — Encounter: Payer: Self-pay | Admitting: Physical Therapy

## 2016-01-11 ENCOUNTER — Ambulatory Visit: Payer: Medicare Other | Admitting: Physical Therapy

## 2016-01-11 DIAGNOSIS — M6281 Muscle weakness (generalized): Secondary | ICD-10-CM

## 2016-01-11 DIAGNOSIS — Z9181 History of falling: Secondary | ICD-10-CM

## 2016-01-11 DIAGNOSIS — R262 Difficulty in walking, not elsewhere classified: Secondary | ICD-10-CM | POA: Diagnosis not present

## 2016-01-11 NOTE — Therapy (Signed)
[facility] [address] [place], [place], [address] Phone: [phone]   Fax:  [phone]  Physical Therapy Treatment  Patient Details  Name: Ann Russell MRN: [id number] Date of Birth: 04/29/1940 Referring Provider: Idelle Crouch MD  Encounter Date: 01/11/2016      PT End of Session - 01/11/16 1538    Visit Number 9   Number of Visits 11   Date for PT Re-Evaluation 01/27/16   PT Start Time 8003   PT Stop Time 1525   PT Time Calculation (min) 63 min   Activity Tolerance Patient tolerated treatment well;Patient limited by fatigue   Behavior During Therapy [facility] for tasks assessed/performed      Past Medical History:  Diagnosis Date  . Arthritis    hands  . 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 Chronic kidney disease    .  Takes Vasotec for kidneys.  . Depression   . Hyperlipemia   . Hypertension   . Sleep apnea     Past Surgical History:  Procedure Laterality Date  . ABDOMINAL HYSTERECTOMY    . Buniectomy     bil  . CHOLECYSTECTOMY    . FACIAL COSMETIC SURGERY  2011  . GASTRIC BYPASS  2010  . Rotor cuff     right  . SCLERAL BUCKLE  11/07/2011   Procedure: SCLERAL BUCKLE;  Surgeon: Hayden Pedro, MD;  Location: [place];  Service: Ophthalmology;  Laterality: Left;    There were no vitals filed for this visit.      Subjective Assessment - 01/11/16 1536    Subjective Pt states that she has been continuing to do her exercise program regularly but would like some feedback on how many reps to perform with her new ankle weights and wants to be sure she is performing all exercises correctly. Pt states that she is walking more now that she has her rollator and feels stable on her feet.   Pertinent History Recurrent falls.   Limitations Lifting;Standing;Walking;House hold activities   How long can you stand comfortably? <20 minutes   How long can you walk comfortably? <5 minutes   Patient Stated  Goals patient would like to improve her balance so she doesn't fall and hurt herself   Currently in Pain? No/denies      Objective:  Therapeutic Exercise: Nu Step 6 minutes Level 6 (warm up/no charge). Review HEP with progressive emphasis: LAQ #2 2x10 with cueing for isometric contraction and decrease lumbar compensation. SLR #2 2x10, hooklying bridge with yellow theraband at proximal knee x10. Standing hamstring curl #2 2x10 R/L. Partial lunge x10; pt fearful of performing modified floor transfer this date.  Pt response for medical necessity: Pt continues to progress well with strengthening program; able to tolerate increased difficulty with HEP using weights/theraband. Pt continues to be limited in functional mobility with most notable obstacle being floor transfer. She is performing lunge with increasing depth and UE support as part of HEP to simulate/progress toward floor transfer       PT Education - 01/11/16 1538    Education provided Yes   Education Details review HEP with inclusion of ankle weights; progression of difficulty   Person(s) Educated Patient   Methods Explanation;Demonstration;Handout   Comprehension Verbalized understanding;Returned demonstration             PT Long Term Goals - 12/30/15 1616      PT LONG TERM GOAL #  1   Title Pt will score >35/80 on LEFS to promote functional mobility.   Baseline 8/15: 23/80. 9/6: 37/80   Time 4   Period Weeks   Status Achieved     PT LONG TERM GOAL #2   Title Pt will demonstrate improvement in LE strength per MMT by 1/2 grade to promote improved activity tolerance.   Baseline MMT R/L: hip flexion 4/4, knee ext 4+/4+, knee flexion 4-/4-, dorsiflexion 3+/3+   Time 4   Period Weeks   Status Partially Met     PT LONG TERM GOAL #3   Title Patient will score >45/56 on BERG assessment to promote increase standing/ambulating stability and decrease fall risk   Baseline 8/15: 38/56. 9/6: 43/56   Time 4   Period Weeks    Status Partially Met     PT LONG TERM GOAL #4   Title Pt will be able to perform floor transfer independently so that she can feel safe with independent living    Baseline pt unable to get up from floor; fearful of not being able to get up. 9/6: pt able to perform partial lunge with confidence   Time 4   Period Weeks   Status Partially Met     PT LONG TERM GOAL #5   Title Pt will be able to perform sit<>stand x5 with no UE support on chair of average height.   Baseline pt requires UE push off when rising from clinic chair 9/6: pt able to perform sit<>stand on 22in height   Time 4   Period Weeks   Status Partially Met            Plan - 01/11/16 1539    Clinical Impression Statement Pt able to perform HEP program with increased difficulty this session including ankle weights for open chain exercises and addition of theraband. She continues to be fearful of floor transfer but is able to perform progressive lunge activity with UE assist to progress toward floor transfer.   Rehab Potential Good   Clinical Impairments Affecting Rehab Potential Positive: Motivation. Negative: Sensation Loss, Age, Generalized Disuse   PT Frequency 1x / week   PT Duration 4 weeks   PT Treatment/Interventions ADLs/Self Care Home Management;Cryotherapy;Electrical Stimulation;Moist Heat;Gait training;Stair training;Functional mobility training;Therapeutic activities;Therapeutic exercise;Balance training;Neuromuscular re-education;Patient/family education;Manual techniques;Passive range of motion   PT Next Visit Plan Balance/LE strength program. Progress home program. Address sit<>stand and floor transfer (progression exercises to promote confidence/strength gains)   PT Home Exercise Plan see HEP in patient instructions.   Consulted and Agree with Plan of Care Patient      Patient will benefit from skilled therapeutic intervention in order to improve the following deficits and impairments:  Abnormal gait,  Difficulty walking, Decreased endurance, Decreased activity tolerance, Pain, Decreased balance, Decreased mobility, Decreased strength, Impaired sensation, Dizziness  Visit Diagnosis: Difficulty in walking, not elsewhere classified  Muscle weakness (generalized)  History of falling  Difficulty walking     Problem List Patient Active Problem List   Diagnosis Date Noted  . 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

## 2016-01-23 ENCOUNTER — Encounter: Payer: Self-pay | Admitting: Gynecology

## 2016-01-23 ENCOUNTER — Ambulatory Visit
Admission: EM | Admit: 2016-01-23 | Discharge: 2016-01-23 | Disposition: A | Discharge status: Home / Self Care | Payer: Medicare Other | Attending: Family Medicine | Admitting: Family Medicine

## 2016-01-23 DIAGNOSIS — J029 Acute pharyngitis, unspecified: Secondary | ICD-10-CM

## 2016-01-23 LAB — RAPID STREP SCREEN (MED CTR MEBANE ONLY): Streptococcus, Group A Screen (Direct): NEGATIVE

## 2016-01-23 MED ORDER — AZITHROMYCIN 250 MG PO TABS: 250.0000 mg | ORAL_TABLET | Freq: Every day | ORAL | 0 refills | Status: DC

## 2016-01-23 NOTE — ED Triage Notes (Signed)
Patient c/o sore throat x 4 days.

## 2016-01-23 NOTE — ED Provider Notes (Signed)
CSN: [id number]     Arrival date & time 01/23/16  2671 History   None    Chief Complaint  Patient presents with  . Sore Throat   (Consider location/radiation/quality/duration/timing/severity/associated sxs/prior Treatment) Pt has had a sore throat for 4 days now. She is concerned due to she has been around others that has been sick. Minimal cough but the throat pain has been inceraseing over time. States that she felt like she had a fever last night but did not check it.       Past Medical History:  Diagnosis Date  . Arthritis    hands  . 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 Chronic kidney disease    .  Takes Vasotec for kidneys.  . Depression   . Hyperlipemia   . Hypertension   . Sleep apnea    Past Surgical History:  Procedure Laterality Date  . ABDOMINAL HYSTERECTOMY    . Buniectomy     bil  . CHOLECYSTECTOMY    . FACIAL COSMETIC SURGERY  2011  . GASTRIC BYPASS  2010  . Rotor cuff     right  . SCLERAL BUCKLE  11/07/2011   Procedure: SCLERAL BUCKLE;  Surgeon: Hayden Pedro, MD;  Location: [place];  Service: Ophthalmology;  Laterality: Left;   Family History  Problem Relation Age of Onset  . Breast cancer Neg Hx    Social History  Substance Use Topics  . Smoking status: Never Smoker  . Smokeless tobacco: Never Used  . Alcohol use No   OB History    No data available     Review of Systems  Allergies  Review of patient's allergies indicates no known allergies.  Home Medications   Prior to Admission medications   Medication Sig Start Date End Date Taking? Authorizing Provider  calcium citrate-vitamin D (CITRACAL+D) 315-200 MG-UNIT tablet Take by mouth daily.   Yes Historical Provider, MD  diphenhydramine-acetaminophen (TYLENOL PM) 25-500 MG TABS Take 2 tablets by mouth at bedtime as needed.   Yes Historical Provider, MD  enalapril (VASOTEC) 2.5 MG tablet Take 2.5 mg by mouth 2 (two) times daily.   Yes Historical Provider, MD   FLUoxetine (PROZAC) 40 MG capsule Take 40 mg by mouth daily.   Yes Historical Provider, MD  valACYclovir (VALTREX) 1000 MG tablet Take 1,000 mg by mouth 2 (two) times daily.   Yes Historical Provider, MD  vitamin B-12 (CYANOCOBALAMIN) 100 MCG tablet Take by mouth daily.   Yes Historical Provider, MD  azithromycin (ZITHROMAX) 250 MG tablet Take 1 tablet (250 mg total) by mouth daily. Take first 2 tablets together, then 1 every day until finished. 01/23/16   Melanee Left, NP  ciprofloxacin (CIPRO) 500 MG tablet Take 500 mg by mouth 2 (two) times daily.    Historical Provider, MD  gatifloxacin (ZYMAXID) 0.5 % SOLN Place 1 drop into the left eye 4 (four) times daily. Patient not taking: Reported on 12/07/2015 11/08/11   Hayden Pedro, MD   Meds Ordered and Administered this Visit  Medications - No data to display  BP (!) 152/76 (BP Location: Left Arm)   Pulse 64   Temp 98.6 F (37 C) (Oral)   Resp 16   Ht 5\' 8"  (1.727 m)   Wt 170 lb (77.1 kg)   BMI 25.85 kg/m  No data found.   Physical Exam  Urgent Care Course   Clinical Course    Procedures (including critical  care time)  Labs Review Labs Reviewed  RAPID STREP SCREEN (NOT AT [facility])  CULTURE, GROUP A STREP V [facility])    Imaging Review No results found.   :         MDM   1. Sore throat    Salt water gargles to help with pain May use tylenol as needed Take full dose of medications Follow up with pcp     Melanee Left, NP 01/23/16 1559

## 2016-01-23 NOTE — Discharge Instructions (Signed)
May use salt water gargle

## 2016-01-25 ENCOUNTER — Encounter: Payer: Medicare Other | Admitting: Physical Therapy

## 2016-01-26 LAB — CULTURE, GROUP A STREP (THRC)

## 2016-02-08 ENCOUNTER — Ambulatory Visit: Payer: Medicare Other | Attending: Internal Medicine | Admitting: Physical Therapy

## 2016-02-08 ENCOUNTER — Encounter: Payer: Self-pay | Admitting: Physical Therapy

## 2016-02-08 DIAGNOSIS — M6281 Muscle weakness (generalized): Secondary | ICD-10-CM | POA: Diagnosis present

## 2016-02-08 DIAGNOSIS — R262 Difficulty in walking, not elsewhere classified: Secondary | ICD-10-CM | POA: Insufficient documentation

## 2016-02-08 DIAGNOSIS — Z9181 History of falling: Secondary | ICD-10-CM | POA: Diagnosis present

## 2016-02-08 NOTE — Therapy (Addendum)
[facility] [address]. [place], [place], [address] Phone: [phone]   Fax:  [phone]  Physical Therapy Treatment  Patient Details  Name: Ann Russell MRN: [id number] Date of Birth: [birth year] Referring Provider: Idelle Crouch MD  Encounter Date: 02/08/2016      PT End of Session - 02/08/16 1731    Visit Number 10   Number of Visits 11   Date for PT Re-Evaluation 02/08/16   PT Start Time 0539   PT Stop Time 1431   PT Time Calculation (min) 52 min   Equipment Utilized During Treatment Gait belt   Activity Tolerance Patient tolerated treatment well   Behavior During Therapy [facility] for tasks assessed/performed      Past Medical History:  Diagnosis Date  . Arthritis    hands  . 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 Chronic kidney disease    .  Takes Vasotec for kidneys.  . Depression   . Hyperlipemia   . Hypertension   . Sleep apnea     Past Surgical History:  Procedure Laterality Date  . ABDOMINAL HYSTERECTOMY    . Buniectomy     bil  . CHOLECYSTECTOMY    . FACIAL COSMETIC SURGERY  2011  . GASTRIC BYPASS  2010  . Rotor cuff     right  . SCLERAL BUCKLE  11/07/2011   Procedure: SCLERAL BUCKLE;  Surgeon: Hayden Pedro, MD;  Location: [place];  Service: Ophthalmology;  Laterality: Left;    There were no vitals filed for this visit.      Subjective Assessment - 02/08/16 1727    Subjective Pt was unable to attend her final physical therapy appointment due to illness; she rescheduled for this date to finalize HEP program and ensure good understanding of exercise progression. Pt notes that due to recent illness she has not been performing her exercises as much as she should have. States that she slept on her left shoulder last night and it is feeling sore today with reaching tasks; no pain at rest.   Pertinent History Recurrent falls.   Limitations Lifting;Standing;Walking;House hold  activities   How long can you stand comfortably? <20 minutes   How long can you walk comfortably? <5 minutes   Patient Stated Goals patient would like to improve her balance so she doesn't fall and hurt herself   Currently in Pain? No/denies     Objective:  Nu Step 8 minutes Level 6 (warm up/no charge)  Therapeutic Exercise: sit to stand with no UE assist x5 attempts (pt unable to perform without assist at this time). Stand to sit x5; pt requires UE to control descent. L shoulder AROM all planes. Pulleys shoulder AAROM scaption/flexion 3 minutes ea. R/L shoulder isometrics: flexion, abduction, extension, ER, IR 10 sec holds x10 ea. Shoulder strengthening/mobility to promote safe use of AD during gait and for UE assist during transfers.   Pt response for medical necessity: Pt presents with new c/o of L sided shoulder pain; reporting that occasionally her shoulder is aggravated by sleeping posture. She experiences soreness in anterior shoulder with reaching tasks; notes positive response to AAROM exercise with pulleys and with no c/o with resisted isometrics. Pt able to ambulate with safe gait strategy using SPC at this time. Pt has demonstrated overall improvement in LE strength and functional mobility with physical therapy treatment; she has experienced a mild set back with recent illness but has  good understanding of home exercise program and will try a trial of independent management and follow up with PT as needed.       PT Education - 02/08/16 1730    Education provided Yes   Education Details Encouraged regular activity/performance of HEP program to maintain and increase functional mobility. See pt instructions for specific HEP exercises.   Person(s) Educated Patient   Methods Explanation;Demonstration;Handout   Comprehension Verbalized understanding;Returned demonstration             PT Long Term Goals - 02/08/16 1736      PT LONG TERM GOAL #1   Title Pt will score >35/80 on  LEFS to promote functional mobility.   Baseline 8/15: 23/80. 9/6: 37/80   Time 4   Period Weeks   Status Achieved     PT LONG TERM GOAL #2   Title Pt will demonstrate improvement in LE strength per MMT by 1/2 grade to promote improved activity tolerance.   Baseline MMT R/L: hip flexion 4/4, knee ext 4+/4+, knee flexion 4-/4-, dorsiflexion 3+/3+   Time 4   Period Weeks   Status Partially Met     PT LONG TERM GOAL #3   Title Patient will score >45/56 on BERG assessment to promote increase standing/ambulating stability and decrease fall risk   Baseline 8/15: 38/56. 9/6: 43/56   Time 4   Period Weeks   Status Partially Met     PT LONG TERM GOAL #4   Title Pt will be able to perform floor transfer independently so that she can feel safe with independent living    Baseline pt unable to get up from floor; fearful of not being able to get up. 9/6: pt able to perform partial lunge with confidence   Time 4   Period Weeks   Status Partially Met     PT LONG TERM GOAL #5   Title Pt will be able to perform sit<>stand x5 with no UE support on chair of average height.   Baseline pt requires UE push off when rising from clinic chair 9/6: pt able to perform sit<>stand on 22in height   Time 4   Period Weeks   Status Partially Met            Plan - 02/08/16 1733    Clinical Impression Statement Pt has progressed well with physical therapy management of LE strength, gait strategy, balance and functional mobility. She has made notable gains as evidenced by LEFS outcome measure and BERG balance assessment as well as strength gains per MMT and subjective report. Pt with c/o of left sided shoulder pain this session with positive response to AAROM using pulleys and no aggravation of sx when performing resisted isometric exercise. Pt demonstrates good understanding of her home program and will transition to independent management of her exercise program at this time and follow up with PT as needed in  the future.   Rehab Potential Good   Clinical Impairments Affecting Rehab Potential Positive: Motivation. Negative: Sensation Loss, Age, Generalized Disuse   PT Frequency 1x / week   PT Duration 4 weeks   PT Treatment/Interventions ADLs/Self Care Home Management;Cryotherapy;Electrical Stimulation;Moist Heat;Gait training;Stair training;Functional mobility training;Therapeutic activities;Therapeutic exercise;Balance training;Neuromuscular re-education;Patient/family education;Manual techniques;Passive range of motion   PT Home Exercise Plan see HEP in patient instructions.   Consulted and Agree with Plan of Care Patient      Patient will benefit from skilled therapeutic intervention in order to improve the following deficits and impairments:  Abnormal gait, Difficulty walking, Decreased endurance, Decreased activity tolerance, Pain, Decreased balance, Decreased mobility, Decreased strength, Impaired sensation, Dizziness  Visit Diagnosis: Difficulty in walking, not elsewhere classified  Muscle weakness (generalized)  History of falling       G-Codes - 2016-02-09 1337    Functional Assessment Tool Used Clinical impression/ gait difficulty/ muscle weakness/ LEFS/ Berg balance   Functional Limitation Mobility: Walking and moving around   Mobility: Walking and Moving Around Current Status [phone]) At least 20 percent but less than 40 percent impaired, limited or restricted   Mobility: Walking and Moving Around Goal Status [phone]) At least 20 percent but less than 40 percent impaired, limited or restricted   Mobility: Walking and Moving Around Discharge Status [phone]) At least 20 percent but less than 40 percent impaired, limited or restricted      Problem List Patient Active Problem List   Diagnosis Date Noted  . 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 [place], [place], [address] Phone: [phone]   Fax:  [phone]  Name: Ann Russell MRN: [id number] Date of Birth: 07/17/1940

## 2016-05-24 ENCOUNTER — Other Ambulatory Visit: Payer: Self-pay | Admitting: Internal Medicine

## 2016-05-24 DIAGNOSIS — Z1231 Encounter for screening mammogram for malignant neoplasm of breast: Secondary | ICD-10-CM

## 2016-09-19 ENCOUNTER — Ambulatory Visit: Payer: Medicare Other

## 2016-09-20 ENCOUNTER — Ambulatory Visit: Admission: RE | Admit: 2016-09-20 | Payer: Medicare Other | Source: Ambulatory Visit

## 2017-02-12 DIAGNOSIS — I208 Other forms of angina pectoris: Secondary | ICD-10-CM | POA: Insufficient documentation

## 2017-02-12 DIAGNOSIS — I2089 Other forms of angina pectoris: Secondary | ICD-10-CM | POA: Insufficient documentation

## 2017-05-31 ENCOUNTER — Emergency Department
Admission: EM | Admit: 2017-05-31 | Discharge: 2017-05-31 | Disposition: A | Discharge status: Home / Self Care | Payer: Medicare Other | Attending: Emergency Medicine | Admitting: Emergency Medicine

## 2017-05-31 ENCOUNTER — Encounter: Payer: Self-pay | Admitting: Emergency Medicine

## 2017-05-31 ENCOUNTER — Emergency Department: Payer: Medicare Other

## 2017-05-31 DIAGNOSIS — I129 Hypertensive chronic kidney disease with stage 1 through stage 4 chronic kidney disease, or unspecified chronic kidney disease: Secondary | ICD-10-CM | POA: Diagnosis not present

## 2017-05-31 DIAGNOSIS — Z85828 Personal history of other malignant neoplasm of skin: Secondary | ICD-10-CM | POA: Diagnosis not present

## 2017-05-31 DIAGNOSIS — Y999 Unspecified external cause status: Secondary | ICD-10-CM | POA: Insufficient documentation

## 2017-05-31 DIAGNOSIS — S3210XA Unspecified fracture of sacrum, initial encounter for closed fracture: Secondary | ICD-10-CM | POA: Insufficient documentation

## 2017-05-31 DIAGNOSIS — N189 Chronic kidney disease, unspecified: Secondary | ICD-10-CM | POA: Insufficient documentation

## 2017-05-31 DIAGNOSIS — Y929 Unspecified place or not applicable: Secondary | ICD-10-CM | POA: Insufficient documentation

## 2017-05-31 DIAGNOSIS — S79912A Unspecified injury of left hip, initial encounter: Secondary | ICD-10-CM | POA: Diagnosis present

## 2017-05-31 DIAGNOSIS — Y939 Activity, unspecified: Secondary | ICD-10-CM | POA: Diagnosis not present

## 2017-05-31 DIAGNOSIS — Z79899 Other long term (current) drug therapy: Secondary | ICD-10-CM | POA: Diagnosis not present

## 2017-05-31 DIAGNOSIS — W19XXXA Unspecified fall, initial encounter: Secondary | ICD-10-CM | POA: Diagnosis not present

## 2017-05-31 DIAGNOSIS — R102 Pelvic and perineal pain: Secondary | ICD-10-CM | POA: Insufficient documentation

## 2017-05-31 LAB — CBC
HCT: 32.2 % — ABNORMAL LOW (ref 35.0–47.0)
Hemoglobin: 10.5 g/dL — ABNORMAL LOW (ref 12.0–16.0)
MCH: 32 pg (ref 26.0–34.0)
MCHC: 32.5 g/dL (ref 32.0–36.0)
MCV: 98.3 fL (ref 80.0–100.0)
PLATELETS: 176 10*3/uL (ref 150–440)
RBC: 3.28 MIL/uL — AB (ref 3.80–5.20)
RDW: 19.5 % — ABNORMAL HIGH (ref 11.5–14.5)
WBC: 6.2 10*3/uL (ref 3.6–11.0)

## 2017-05-31 LAB — BASIC METABOLIC PANEL
ANION GAP: 11 (ref 5–15)
BUN: 37 mg/dL — ABNORMAL HIGH (ref 6–20)
CALCIUM: 9.1 mg/dL (ref 8.9–10.3)
CO2: 22 mmol/L (ref 22–32)
Chloride: 105 mmol/L (ref 101–111)
Creatinine, Ser: 1.61 mg/dL — ABNORMAL HIGH (ref 0.44–1.00)
GFR calc non Af Amer: 30 mL/min — ABNORMAL LOW (ref >60)
GFR, EST AFRICAN AMERICAN: 35 mL/min — AB (ref >60)
Glucose, Bld: 98 mg/dL (ref 65–99)
POTASSIUM: 3.6 mmol/L (ref 3.5–5.1)
Sodium: 138 mmol/L (ref 135–145)

## 2017-05-31 MED ORDER — OXYCODONE-ACETAMINOPHEN 5-325 MG PO TABS: 1.0000 | ORAL_TABLET | ORAL | 0 refills | Status: DC | PRN

## 2017-05-31 MED ORDER — DOCUSATE SODIUM 100 MG PO CAPS: 100.0000 mg | ORAL_CAPSULE | Freq: Two times a day (BID) | ORAL | 0 refills | Status: DC

## 2017-05-31 MED ORDER — MORPHINE SULFATE (PF) 4 MG/ML IV SOLN
4.0000 mg | Freq: Once | INTRAVENOUS | Status: AC
  Administered 2017-05-31: 4 mg via INTRAVENOUS

## 2017-05-31 MED ORDER — ONDANSETRON HCL 4 MG/2ML IJ SOLN
4.0000 mg | Freq: Once | INTRAMUSCULAR | Status: AC
  Administered 2017-05-31: 4 mg via INTRAVENOUS
  Filled 2017-05-31: qty 2

## 2017-05-31 MED ORDER — MORPHINE SULFATE (PF) 4 MG/ML IV SOLN
4.0000 mg | Freq: Once | INTRAVENOUS | Status: AC
  Administered 2017-05-31: 4 mg via INTRAVENOUS
  Filled 2017-05-31: qty 1

## 2017-05-31 MED ORDER — MORPHINE SULFATE (PF) 4 MG/ML IV SOLN
INTRAVENOUS | Status: AC
  Filled 2017-05-31: qty 1

## 2017-05-31 MED ORDER — ONDANSETRON 4 MG PO TBDP: 4.0000 mg | ORAL_TABLET | Freq: Three times a day (TID) | ORAL | 0 refills | Status: DC | PRN

## 2017-05-31 NOTE — ED Triage Notes (Signed)
Pt arrived via ems from home. Pt fell 3 days ago and has had persistent lower back pain since that has limited patients ability to perform ADLS. PT was given muscle relaxants by her primary doctor 2 days ago which has provided no relief. Pt was given 45mcg of fentanyl and 4mg  of zofran. Upon arrival pt alert and oriented x4.

## 2017-05-31 NOTE — ED Notes (Signed)
Pt taken to CT.

## 2017-05-31 NOTE — ED Provider Notes (Addendum)
[facility] Emergency Department Provider Note  Time seen: 10:19 AM  I have reviewed the triage vital signs and the nursing notes.   HISTORY  Chief Complaint Back Pain    HPI Ann Russell is a 77 y.o. female with a past medical history of arthritis, depression, hypertension, hyperlipidemia, neuropathy, presents the emergency department after a fall with pelvis and left hip pain.  According to the patient due to her neuropathy she does fall on occasion, states 3 days ago she fell onto her left side.  Denies hitting her head.  Patient states left hip and pain down into her pelvis and coccyx over the past 3 days.  States that has progressively worsened.  She states at first she was able to ambulate somewhat but with significant pain, she states over the past 24 hours she has not been able to ambulate due to the pain.  She called her doctor who called her in a prescription for a muscle relaxer but the patient states this has not helped her discomfort.  Patient denies any other symptoms, largely negative review of systems.   Past Medical History:  Diagnosis Date  . Arthritis    hands  . 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 Chronic kidney disease    .  Takes Vasotec for kidneys.  . Depression   . Hyperlipemia   . Hypertension   . Sleep apnea     Patient Active Problem List   Diagnosis Date Noted  . 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 ABDOMINAL HYSTERECTOMY    . Buniectomy     bil  . CHOLECYSTECTOMY    . FACIAL COSMETIC SURGERY  2011  . GASTRIC BYPASS  2010  . Rotor cuff     right  . SCLERAL BUCKLE  11/07/2011   Procedure: SCLERAL BUCKLE;  Surgeon: Hayden Pedro, MD;  Location: [place];  Service: Ophthalmology;  Laterality: Left;    Prior to Admission medications   Medication Sig Start Date End Date Taking? Authorizing Provider  azithromycin (ZITHROMAX) 250 MG  tablet Take 1 tablet (250 mg total) by mouth daily. Take first 2 tablets together, then 1 every day until finished. 01/23/16   Marney Setting, NP  calcium citrate-vitamin D (CITRACAL+D) 315-200 MG-UNIT tablet Take by mouth daily.    [provider]  ciprofloxacin (CIPRO) 500 MG tablet Take 500 mg by mouth 2 (two) times daily.    [provider]  diphenhydramine-acetaminophen (TYLENOL PM) 25-500 MG TABS Take 2 tablets by mouth at bedtime as needed.    [provider]  enalapril (VASOTEC) 2.5 MG tablet Take 2.5 mg by mouth 2 (two) times daily.    [provider]  FLUoxetine (PROZAC) 20 MG capsule Take 60 mg by mouth daily. 05/20/17   [provider]  gatifloxacin (ZYMAXID) 0.5 % SOLN Place 1 drop into the left eye 4 (four) times daily. Patient not taking: Reported on 12/07/2015 11/08/11   Hayden Pedro, MD  valACYclovir (VALTREX) 1000 MG tablet Take 1,000 mg by mouth 2 (two) times daily.    [provider]  vitamin B-12 (CYANOCOBALAMIN) 100 MCG tablet Take by mouth daily.    [provider]    No Known Allergies  Family History  Problem Relation Age of Onset  . Breast cancer Neg Hx     Social History Social History   Tobacco Use  .  Smoking status: Never Smoker  . Smokeless tobacco: Never Used  Substance Use Topics  . Alcohol use: No  . Drug use: No    Review of Systems Constitutional: Negative for f loss of consciousness or fever Eyes: Negative for visual complaints ENT: Negative for recent illness/congestion Cardiovascular: Negative for chest pain. Respiratory: Negative for shortness of breath. Gastrointestinal: Negative for abdominal pain, vomiting Genitourinary: Negative for urinary compaints Musculoskeletal: Left hip pain, pelvis pain Skin: Negative for skin complaints  Neurological: Negative for headache All other ROS negative  ____________________________________________   PHYSICAL EXAM:  VITAL  SIGNS: ED Triage Vitals [05/31/17 0931]  Enc Vitals Group     BP 138/79     Pulse Rate 73     Resp 18     Temp 98 F (36.7 C)     Temp Source Oral     SpO2 96 %     Weight 150 lb (68 kg)     Height 5\' 7"  (1.702 m)     Head Circumference      Peak Flow      Pain Score 2     Pain Loc      Pain Edu?      Excl. in [place]?     Constitutional: Alert and oriented. Well appearing and in no distress. Eyes: Normal exam ENT   Head: Normocephalic and atraumatic.   Mouth/Throat: Mucous membranes are moist. Cardiovascular: Normal rate, regular rhythm. No murmur Respiratory: Normal respiratory effort without tachypnea nor retractions. Breath sounds are clear  Gastrointestinal: Soft and nontender. No distention.  Musculoskeletal: No hip tenderness to palpation but moderate pain with attempted range of motion of the left hip.  Good range of motion in the right hip and nontender with no pain with range of motion in the right hip.  Patient also has pain across her sacrum and coccyx, no back or neck tenderness. Neurologic:  Normal speech and language. No gross focal neurologic deficits Skin:  Skin is warm, dry and intact.  Psychiatric: Mood and affect are normal.   ____________________________________________   RADIOLOGY  X-rays are negative CT scan shows nondisplaced sacral fracture.  ____________________________________________   INITIAL IMPRESSION / ASSESSMENT AND PLAN / ED COURSE  Pertinent labs & imaging results that were available during my care of the patient were reviewed by me and considered in my medical decision making (see chart for details).  Patient presents to the emergency department for fall 3 days ago now with worsening pain.  Differential would include fracture, contusion, dislocation, hip fracture, pelvis fracture, coccyx or sacral fracture.  We will check x-ray imaging of the left hip and pelvis, treat pain and nausea and check basic labs as a precaution.  Patient  agreeable to this plan of care.  CT consistent with nondisplaced sacral fracture.  Discussed admission for pain control versus discharge home.  Patient strongly wishes to be discharged home.  Patient states she lives with her boyfriend who can help her at home.  We will attempt to ambulate in the emergency department.  If the patient is able to ambulate safely we will discharge with orthopedic follow-up otherwise we will admit for pain control.  Patient was able to ambulate with the use of a walker.  We will discharge with pain medication and orthopedic follow-up.  Discussed patient with Dr. Posey Pronto, weightbearing as tolerated follow-up in the office.  ____________________________________________   FINAL CLINICAL IMPRESSION(S) / ED DIAGNOSES  Pain Fall Sacral fracture   Harvest Dark, MD 05/31/17 1245  Harvest Dark, MD 05/31/17 1313    Harvest Dark, MD 05/31/17 1329

## 2017-05-31 NOTE — ED Notes (Signed)
Pt returned from CT via stretcher.

## 2017-05-31 NOTE — ED Notes (Signed)
Pt ambulated well with walker. When getting back on to stretcher pt was yelling out in pain. Pt states she wants to go home but is worried about getting around at home d/t few steps in house and painful getting up and down to bathroom. Dr. Kerman Passey informed.

## 2017-05-31 NOTE — ED Notes (Signed)
Pt given water, ok per Dr. Kerman Passey.

## 2017-05-31 NOTE — Discharge Instructions (Signed)
Please take your pain medication as needed, as written.  Please use your walker.  Please follow-up with orthopedics by calling the number provided.  Return to the emergency department for any increase in pain, weakness or numbness of either leg, loss of control of your bladder or bowel, or any other symptom personally concerning to yourself.

## 2017-05-31 NOTE — ED Notes (Signed)
Pt calling [facility] for ride home. Pt states she doesn't want to be admitted to hospital or go to rehab. States she has walker at home and has grab bars in bathroom. States she thinks she could heal better at home.

## 2017-06-21 ENCOUNTER — Encounter: Payer: Self-pay | Admitting: Radiology

## 2017-06-21 ENCOUNTER — Emergency Department: Payer: Medicare Other

## 2017-06-21 ENCOUNTER — Other Ambulatory Visit: Payer: Self-pay

## 2017-06-21 ENCOUNTER — Inpatient Hospital Stay
Admission: EM | Admit: 2017-06-21 | Discharge: 2017-06-23 | DRG: 163 | Disposition: A | Discharge status: Home Health | Payer: Medicare Other | Attending: Internal Medicine | Admitting: Internal Medicine

## 2017-06-21 DIAGNOSIS — Z8249 Family history of ischemic heart disease and other diseases of the circulatory system: Secondary | ICD-10-CM | POA: Diagnosis not present

## 2017-06-21 DIAGNOSIS — Z9884 Bariatric surgery status: Secondary | ICD-10-CM | POA: Diagnosis not present

## 2017-06-21 DIAGNOSIS — R0602 Shortness of breath: Secondary | ICD-10-CM | POA: Diagnosis not present

## 2017-06-21 DIAGNOSIS — I2699 Other pulmonary embolism without acute cor pulmonale: Secondary | ICD-10-CM | POA: Diagnosis present

## 2017-06-21 DIAGNOSIS — I2609 Other pulmonary embolism with acute cor pulmonale: Secondary | ICD-10-CM | POA: Diagnosis not present

## 2017-06-21 DIAGNOSIS — E785 Hyperlipidemia, unspecified: Secondary | ICD-10-CM | POA: Diagnosis present

## 2017-06-21 DIAGNOSIS — Z90710 Acquired absence of both cervix and uterus: Secondary | ICD-10-CM

## 2017-06-21 DIAGNOSIS — R531 Weakness: Secondary | ICD-10-CM | POA: Diagnosis not present

## 2017-06-21 DIAGNOSIS — I10 Essential (primary) hypertension: Secondary | ICD-10-CM | POA: Diagnosis not present

## 2017-06-21 DIAGNOSIS — I82431 Acute embolism and thrombosis of right popliteal vein: Secondary | ICD-10-CM | POA: Diagnosis present

## 2017-06-21 DIAGNOSIS — Z7401 Bed confinement status: Secondary | ICD-10-CM | POA: Diagnosis not present

## 2017-06-21 DIAGNOSIS — M25512 Pain in left shoulder: Secondary | ICD-10-CM | POA: Diagnosis present

## 2017-06-21 DIAGNOSIS — N183 Chronic kidney disease, stage 3 (moderate): Secondary | ICD-10-CM | POA: Diagnosis present

## 2017-06-21 DIAGNOSIS — Z85828 Personal history of other malignant neoplasm of skin: Secondary | ICD-10-CM | POA: Diagnosis not present

## 2017-06-21 DIAGNOSIS — R918 Other nonspecific abnormal finding of lung field: Secondary | ICD-10-CM | POA: Diagnosis not present

## 2017-06-21 DIAGNOSIS — R52 Pain, unspecified: Secondary | ICD-10-CM

## 2017-06-21 DIAGNOSIS — J9601 Acute respiratory failure with hypoxia: Secondary | ICD-10-CM | POA: Diagnosis present

## 2017-06-21 DIAGNOSIS — N189 Chronic kidney disease, unspecified: Secondary | ICD-10-CM | POA: Diagnosis not present

## 2017-06-21 DIAGNOSIS — Z9181 History of falling: Secondary | ICD-10-CM | POA: Diagnosis not present

## 2017-06-21 DIAGNOSIS — I2602 Saddle embolus of pulmonary artery with acute cor pulmonale: Secondary | ICD-10-CM | POA: Diagnosis present

## 2017-06-21 DIAGNOSIS — I129 Hypertensive chronic kidney disease with stage 1 through stage 4 chronic kidney disease, or unspecified chronic kidney disease: Secondary | ICD-10-CM | POA: Diagnosis present

## 2017-06-21 DIAGNOSIS — G4730 Sleep apnea, unspecified: Secondary | ICD-10-CM | POA: Diagnosis present

## 2017-06-21 DIAGNOSIS — F329 Major depressive disorder, single episode, unspecified: Secondary | ICD-10-CM | POA: Diagnosis present

## 2017-06-21 LAB — COMPREHENSIVE METABOLIC PANEL
ALK PHOS: 110 U/L (ref 38–126)
ALT: 29 U/L (ref 14–54)
ANION GAP: 9 (ref 5–15)
AST: 30 U/L (ref 15–41)
Albumin: 3.5 g/dL (ref 3.5–5.0)
BILIRUBIN TOTAL: 0.8 mg/dL (ref 0.3–1.2)
BUN: 32 mg/dL — ABNORMAL HIGH (ref 6–20)
CALCIUM: 9.6 mg/dL (ref 8.9–10.3)
CO2: 21 mmol/L — ABNORMAL LOW (ref 22–32)
Chloride: 109 mmol/L (ref 101–111)
Creatinine, Ser: 1.41 mg/dL — ABNORMAL HIGH (ref 0.44–1.00)
GFR, EST AFRICAN AMERICAN: 41 mL/min — AB (ref >60)
GFR, EST NON AFRICAN AMERICAN: 35 mL/min — AB (ref >60)
GLUCOSE: 99 mg/dL (ref 65–99)
POTASSIUM: 4.5 mmol/L (ref 3.5–5.1)
Sodium: 139 mmol/L (ref 135–145)
TOTAL PROTEIN: 6.8 g/dL (ref 6.5–8.1)

## 2017-06-21 LAB — CBC WITH DIFFERENTIAL/PLATELET
Basophils Absolute: 0.1 10*3/uL (ref 0–0.1)
Basophils Relative: 1 %
Eosinophils Absolute: 0.1 10*3/uL (ref 0–0.7)
Eosinophils Relative: 1 %
HEMATOCRIT: 38.4 % (ref 35.0–47.0)
HEMOGLOBIN: 12.6 g/dL (ref 12.0–16.0)
LYMPHS ABS: 1.4 10*3/uL (ref 1.0–3.6)
Lymphocytes Relative: 26 %
MCH: 31.4 pg (ref 26.0–34.0)
MCHC: 32.9 g/dL (ref 32.0–36.0)
MCV: 95.6 fL (ref 80.0–100.0)
MONO ABS: 0.5 10*3/uL (ref 0.2–0.9)
MONOS PCT: 9 %
NEUTROS ABS: 3.5 10*3/uL (ref 1.4–6.5)
NEUTROS PCT: 63 %
Platelets: 228 10*3/uL (ref 150–440)
RBC: 4.01 MIL/uL (ref 3.80–5.20)
RDW: 18 % — ABNORMAL HIGH (ref 11.5–14.5)
WBC: 5.6 10*3/uL (ref 3.6–11.0)

## 2017-06-21 LAB — PROTIME-INR
INR: 1.19
Prothrombin Time: 15 seconds (ref 11.4–15.2)

## 2017-06-21 LAB — TROPONIN I: TROPONIN I: 0.03 ng/mL — AB (ref <0.03)

## 2017-06-21 LAB — BRAIN NATRIURETIC PEPTIDE: B NATRIURETIC PEPTIDE 5: 1235 pg/mL — AB (ref 0.0–100.0)

## 2017-06-21 LAB — APTT: APTT: 157 s — AB (ref 24–36)

## 2017-06-21 MED ORDER — HEPARIN BOLUS VIA INFUSION
4000.0000 [IU] | Freq: Once | INTRAVENOUS | Status: DC
  Filled 2017-06-21: qty 4000

## 2017-06-21 MED ORDER — MORPHINE SULFATE (PF) 2 MG/ML IV SOLN
2.0000 mg | INTRAVENOUS | Status: DC | PRN
  Administered 2017-06-21 – 2017-06-23 (×2): 2 mg via INTRAVENOUS
  Filled 2017-06-21 (×2): qty 1

## 2017-06-21 MED ORDER — ENALAPRIL MALEATE 2.5 MG PO TABS
2.5000 mg | ORAL_TABLET | Freq: Two times a day (BID) | ORAL | Status: DC
  Administered 2017-06-22 – 2017-06-23 (×3): 2.5 mg via ORAL
  Filled 2017-06-21 (×5): qty 1

## 2017-06-21 MED ORDER — SODIUM CHLORIDE 0.9 % IV BOLUS (SEPSIS)
1000.0000 mL | Freq: Once | INTRAVENOUS | Status: AC
  Administered 2017-06-21: 1000 mL via INTRAVENOUS

## 2017-06-21 MED ORDER — BUSPIRONE HCL 15 MG PO TABS
15.0000 mg | ORAL_TABLET | Freq: Two times a day (BID) | ORAL | Status: DC
  Administered 2017-06-21 – 2017-06-23 (×4): 15 mg via ORAL
  Filled 2017-06-21 (×5): qty 1

## 2017-06-21 MED ORDER — CALCITRIOL 0.25 MCG PO CAPS
0.2500 ug | ORAL_CAPSULE | Freq: Every day | ORAL | Status: DC
  Administered 2017-06-21 – 2017-06-23 (×3): 0.25 ug via ORAL
  Filled 2017-06-21 (×3): qty 1

## 2017-06-21 MED ORDER — SODIUM CHLORIDE 0.9 % IV SOLN: 250.0000 mL | INTRAVENOUS | Status: DC | PRN

## 2017-06-21 MED ORDER — OXYCODONE-ACETAMINOPHEN 5-325 MG PO TABS: 1.0000 | ORAL_TABLET | ORAL | Status: DC | PRN

## 2017-06-21 MED ORDER — IPRATROPIUM-ALBUTEROL 0.5-2.5 (3) MG/3ML IN SOLN
3.0000 mL | Freq: Once | RESPIRATORY_TRACT | Status: AC
  Administered 2017-06-21: 3 mL via RESPIRATORY_TRACT
  Filled 2017-06-21: qty 3

## 2017-06-21 MED ORDER — ACETAMINOPHEN 325 MG PO TABS
650.0000 mg | ORAL_TABLET | Freq: Four times a day (QID) | ORAL | Status: DC | PRN
  Administered 2017-06-22: 650 mg via ORAL
  Filled 2017-06-21: qty 2

## 2017-06-21 MED ORDER — ACETAMINOPHEN 500 MG PO TABS
1000.0000 mg | ORAL_TABLET | Freq: Once | ORAL | Status: AC
  Administered 2017-06-21: 1000 mg via ORAL
  Filled 2017-06-21: qty 2

## 2017-06-21 MED ORDER — SODIUM CHLORIDE 0.9% FLUSH: 3.0000 mL | INTRAVENOUS | Status: DC | PRN

## 2017-06-21 MED ORDER — VITAMIN B-12 100 MCG PO TABS
100.0000 ug | ORAL_TABLET | Freq: Every day | ORAL | Status: DC
  Administered 2017-06-21 – 2017-06-23 (×3): 100 ug via ORAL
  Filled 2017-06-21 (×3): qty 1

## 2017-06-21 MED ORDER — HEPARIN SODIUM (PORCINE) 5000 UNIT/ML IJ SOLN
4000.0000 [IU] | Freq: Once | INTRAMUSCULAR | Status: AC
  Administered 2017-06-21: 4000 [IU] via INTRAVENOUS
  Filled 2017-06-21: qty 1

## 2017-06-21 MED ORDER — SODIUM CHLORIDE 0.9% FLUSH
3.0000 mL | Freq: Two times a day (BID) | INTRAVENOUS | Status: DC
  Administered 2017-06-22 – 2017-06-23 (×2): 3 mL via INTRAVENOUS

## 2017-06-21 MED ORDER — CALCIUM CITRATE 950 (200 CA) MG PO TABS
1.0000 | ORAL_TABLET | Freq: Every day | ORAL | Status: DC
  Administered 2017-06-21 – 2017-06-23 (×3): 200 mg via ORAL
  Filled 2017-06-21 (×3): qty 1

## 2017-06-21 MED ORDER — ACETAMINOPHEN 650 MG RE SUPP: 650.0000 mg | Freq: Four times a day (QID) | RECTAL | Status: DC | PRN

## 2017-06-21 MED ORDER — ONDANSETRON HCL 4 MG/2ML IJ SOLN: 4.0000 mg | Freq: Four times a day (QID) | INTRAMUSCULAR | Status: DC | PRN

## 2017-06-21 MED ORDER — HEPARIN (PORCINE) IN NACL 100-0.45 UNIT/ML-% IJ SOLN
950.0000 [IU]/h | INTRAMUSCULAR | Status: DC
  Administered 2017-06-21: 1100 [IU]/h via INTRAVENOUS
  Filled 2017-06-21: qty 250

## 2017-06-21 MED ORDER — IOPAMIDOL (ISOVUE-370) INJECTION 76%
60.0000 mL | Freq: Once | INTRAVENOUS | Status: AC | PRN
  Administered 2017-06-21: 60 mL via INTRAVENOUS

## 2017-06-21 MED ORDER — SENNOSIDES-DOCUSATE SODIUM 8.6-50 MG PO TABS: 1.0000 | ORAL_TABLET | Freq: Every evening | ORAL | Status: DC | PRN

## 2017-06-21 MED ORDER — ONDANSETRON HCL 4 MG PO TABS: 4.0000 mg | ORAL_TABLET | Freq: Four times a day (QID) | ORAL | Status: DC | PRN

## 2017-06-21 MED ORDER — FLUOXETINE HCL 20 MG PO CAPS
60.0000 mg | ORAL_CAPSULE | Freq: Every day | ORAL | Status: DC
  Administered 2017-06-21 – 2017-06-23 (×3): 60 mg via ORAL
  Filled 2017-06-21 (×3): qty 3

## 2017-06-21 NOTE — ED Notes (Signed)
Pt current O2 sat of 87%; pt placed on 2L O2 via nasal canula per verbal order from Dr. Quentin Cornwall

## 2017-06-21 NOTE — ED Provider Notes (Signed)
[facility] Emergency Department Provider Note    First MD Initiated Contact with Patient 06/21/17 1649     (approximate)  I have reviewed the triage vital signs and the nursing notes.   HISTORY  Chief Complaint Shortness of Breath and Generalized Body Aches    HPI Ann Russell is a 77 y.o. female who presents with chief complaint progressive shortness of breath the past 3 days and generalized weakness for the past 2 weeks.  Patient has been bedridden for the past week after having a fall.  Denies any pain.  No nausea or vomiting.  No fevers.  No cough.  No previous history of lung disease.  No history of blood clots.  No history of malignancy.  She does not smoke.  Denies any orthopnea or lower extremity swelling.  Past Medical History:  Diagnosis Date  . Arthritis    hands  . 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 Chronic kidney disease    .  Takes Vasotec for kidneys.  . Depression   . Hyperlipemia   . Hypertension   . Sleep apnea    Family History  Problem Relation Age of Onset  . Breast cancer Neg Hx    Past Surgical History:  Procedure Laterality Date  . ABDOMINAL HYSTERECTOMY    . Buniectomy     bil  . CHOLECYSTECTOMY    . FACIAL COSMETIC SURGERY  2011  . GASTRIC BYPASS  2010  . Rotor cuff     right  . SCLERAL BUCKLE  11/07/2011   Procedure: SCLERAL BUCKLE;  Surgeon: Hayden Pedro, MD;  Location: [place];  Service: Ophthalmology;  Laterality: Left;   Patient Active Problem List   Diagnosis Date Noted  . Pulmonary embolism ([place]) 06/21/2017  . 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 Authorizing Provider  busPIRone (BUSPAR) 15 MG tablet Take 1 tablet by mouth 2 (two) times daily. 06/06/17  Yes [provider]  calcitRIOL (ROCALTROL) 0.25 MCG capsule Take 1 capsule by mouth daily. 06/16/17  Yes [provider]    calcium citrate-vitamin D (CITRACAL+D) 315-200 MG-UNIT tablet Take 1 tablet by mouth daily.    Yes [provider]  enalapril (VASOTEC) 2.5 MG tablet Take 2.5 mg by mouth 2 (two) times daily.   Yes [provider]  FLUoxetine (PROZAC) 20 MG capsule Take 60 mg by mouth daily. 05/20/17  Yes [provider]  oxyCODONE-acetaminophen (PERCOCET) 5-325 MG tablet Take 1 tablet by mouth every 4 (four) hours as needed for severe pain. 05/31/17  Yes Harvest Dark, MD  vitamin B-12 (CYANOCOBALAMIN) 100 MCG tablet Take 100 mcg by mouth daily.    Yes [provider]  azithromycin (ZITHROMAX) 250 MG tablet Take 1 tablet (250 mg total) by mouth daily. Take first 2 tablets together, then 1 every day until finished. Patient not taking: Reported on 05/31/2017 01/23/16   Marney Setting, NP  diphenhydramine-acetaminophen (TYLENOL PM) 25-500 MG TABS Take 2 tablets by mouth at bedtime as needed.    [provider]  docusate sodium (COLACE) 100 MG capsule Take 1 capsule (100 mg total) by mouth 2 (two) times daily. Patient not taking: Reported on 06/21/2017 05/31/17 06/30/17  Harvest Dark, MD  gatifloxacin (ZYMAXID) 0.5 % SOLN Place 1 drop into the left eye 4 (four) times daily. Patient not taking: Reported on 12/07/2015  11/08/11   Hayden Pedro, MD  ondansetron (ZOFRAN ODT) 4 MG disintegrating tablet Take 1 tablet (4 mg total) by mouth every 8 (eight) hours as needed for nausea or vomiting. 05/31/17   Harvest Dark, MD  valACYclovir (VALTREX) 1000 MG tablet Take 1,000 mg by mouth 2 (two) times daily.    [provider]    Allergies Patient has no known allergies.    Social History Social History   Tobacco Use  . Smoking status: Never Smoker  . Smokeless tobacco: Never Used  Substance Use Topics  . Alcohol use: No  . Drug use: No    Review of Systems Patient denies headaches, rhinorrhea, blurry vision, numbness, shortness of breath, chest pain,  edema, cough, abdominal pain, nausea, vomiting, diarrhea, dysuria, fevers, rashes or hallucinations unless otherwise stated above in HPI. ____________________________________________   PHYSICAL EXAM:  VITAL SIGNS: Vitals:   06/21/17 1800 06/21/17 1830  BP: 132/85 (!) 146/85  Pulse: 83 78  Resp: 14 14  Temp:    SpO2: 94% 100%    Constitutional: Alert and oriented. Mild tachypnea Eyes: Conjunctivae are normal.  Head: Atraumatic. Nose: No congestion/rhinnorhea. Mouth/Throat: Mucous membranes are moist.   Neck: No stridor. Painless ROM.  Cardiovascular: Normal rate, regular rhythm. Grossly normal heart sounds.  Good peripheral circulation. Respiratory: Normal respiratory effort.  No retractions. Lungs CTAB. Gastrointestinal: Soft and nontender. No distention. No abdominal bruits. No CVA tenderness. Musculoskeletal: No lower extremity tenderness nor edema.  No joint effusions. Neurologic:  Normal speech and language. No gross focal neurologic deficits are appreciated. No facial droop Skin:  Skin is warm, dry and intact. No rash noted. Psychiatric: Mood and affect are normal. Speech and behavior are normal.  ____________________________________________   LABS (all labs ordered are listed, but only abnormal results are displayed)  Results for orders placed or performed during the hospital encounter of 06/21/17 (from the past 24 hour(s))  CBC with Differential/Platelet     Status: Abnormal   Collection Time: 06/21/17  5:19 PM  Result Value Ref Range   WBC 5.6 3.6 - 11.0 K/uL   RBC 4.01 3.80 - 5.20 MIL/uL   Hemoglobin 12.6 12.0 - 16.0 g/dL   HCT 38.4 35.0 - 47.0 %   MCV 95.6 80.0 - 100.0 fL   MCH 31.4 26.0 - 34.0 pg   MCHC 32.9 32.0 - 36.0 g/dL   RDW 18.0 (H) 11.5 - 14.5 %   Platelets 228 150 - 440 K/uL   Neutrophils Relative % 63 %   Neutro Abs 3.5 1.4 - 6.5 K/uL   Lymphocytes Relative 26 %   Lymphs Abs 1.4 1.0 - 3.6 K/uL   Monocytes Relative 9 %   Monocytes Absolute 0.5  0.2 - 0.9 K/uL   Eosinophils Relative 1 %   Eosinophils Absolute 0.1 0 - 0.7 K/uL   Basophils Relative 1 %   Basophils Absolute 0.1 0 - 0.1 K/uL  Comprehensive metabolic panel     Status: Abnormal   Collection Time: 06/21/17  5:19 PM  Result Value Ref Range   Sodium 139 135 - 145 mmol/L   Potassium 4.5 3.5 - 5.1 mmol/L   Chloride 109 101 - 111 mmol/L   CO2 21 (L) 22 - 32 mmol/L   Glucose, Bld 99 65 - 99 mg/dL   BUN 32 (H) 6 - 20 mg/dL   Creatinine, Ser 1.41 (H) 0.44 - 1.00 mg/dL   Calcium 9.6 8.9 - 10.3 mg/dL   Total Protein 6.8 6.5 -  8.1 g/dL   Albumin 3.5 3.5 - 5.0 g/dL   AST 30 15 - 41 U/L   ALT 29 14 - 54 U/L   Alkaline Phosphatase 110 38 - 126 U/L   Total Bilirubin 0.8 0.3 - 1.2 mg/dL   GFR calc non Af Amer 35 (L) >60 mL/min   GFR calc Af Amer 41 (L) >60 mL/min   Anion gap 9 5 - 15  Troponin I     Status: Abnormal   Collection Time: 06/21/17  5:19 PM  Result Value Ref Range   Troponin I 0.03 (HH) <0.03 ng/mL  Brain natriuretic peptide     Status: Abnormal   Collection Time: 06/21/17  5:19 PM  Result Value Ref Range   B Natriuretic Peptide 1,235.0 (H) 0.0 - 100.0 pg/mL   ____________________________________________  EKG My review and personal interpretation at Time: 16:59   Indication: sobh  Rate: 80  Rhythm: sinus Axis: normal Other: normal intervals, poor r wave progression,  ____________________________________________  RADIOLOGY  I personally reviewed all radiographic images ordered to evaluate for the above acute complaints and reviewed radiology reports and findings.  These findings were personally discussed with the patient.  Please see medical record for radiology report.  ____________________________________________   PROCEDURES  Procedure(s) performed:  .Critical Care Performed by: Merlyn Lot, MD Authorized by: Merlyn Lot, MD   Critical care provider statement:    Critical care time (minutes):  35   Critical care time was exclusive  of:  Separately billable procedures and treating other patients   Critical care was necessary to treat or prevent imminent or life-threatening deterioration of the following conditions:  Respiratory failure   Critical care was time spent personally by me on the following activities:  Development of treatment plan with patient or surrogate, discussions with consultants, evaluation of patient's response to treatment, examination of patient, obtaining history from patient or surrogate, ordering and performing treatments and interventions, ordering and review of laboratory studies, ordering and review of radiographic studies, pulse oximetry, re-evaluation of patient's condition and review of old charts      Critical Care performed: yes ____________________________________________   INITIAL IMPRESSION / [place] / ED COURSE  Pertinent labs & imaging results that were available during my care of the patient were reviewed by me and considered in my medical decision making (see chart for details).  DDX: Asthma, copd, CHF, pna, ptx, malignancy, Pe, anemia   THEODOSIA BAHENA is a 77 y.o. who presents to the ED with shortness of breath as described above.  Will trial nebulizer the patient does not have any wheeze.  No signs or symptoms of congestive heart failure.  She is afebrile no evidence of pneumonia.  Will order CT angiogram as I am embolism.  Will provide IV fluids.  Clinical Course as of Jun 21 1937  Thu Jun 21, 2017  1822 Given the patient's acute hypoxia with no other expiration for her symptoms recently being bedbound will order CT angiogram to evaluate for pulmonary embolism.  [PR]  1911 With evidence of saddle embolism with right heart strain.  Patient with worsening hypoxia.  Will call code PE.  Starting patient on bolus heparin drip.  [PR]    Clinical Course User Index [PR] Merlyn Lot, MD     ____________________________________________   FINAL CLINICAL  IMPRESSION(S) / ED DIAGNOSES  Final diagnoses:  Acute respiratory failure with hypoxia ([place])  Acute saddle pulmonary embolism with acute cor pulmonale ([place])  NEW MEDICATIONS STARTED DURING THIS VISIT:  New Prescriptions   No medications on file     Note:  This document was prepared using Dragon voice recognition software and may include unintentional dictation errors.    Merlyn Lot, MD 06/21/17 [phone]

## 2017-06-21 NOTE — H&P (Signed)
[place] [facility] [place] NAME: Ann Russell    MR#:  [id number]  DATE OF BIRTH:  1940/05/08  DATE OF ADMISSION:  06/21/2017  PRIMARY CARE PHYSICIAN: Idelle Crouch, MD   REQUESTING/REFERRING PHYSICIAN:   CHIEF COMPLAINT:   Chief Complaint  Patient presents with  . Shortness of Breath  . Generalized Body Aches    HISTORY OF PRESENT ILLNESS: Ann Russell  is a 77 y.o. female with a known history of arthritis, squamous cell cancer of the skin, chronic kidney disease, hyperlipidemia, hypertension, sleep apnea presented to the emergency room with difficulty breathing for 3 days.  Patient felt short of breath for the last 3 days .  No complaints of any chest pain.  Patient broke her sacrum secondary to fall 3 weeks ago and has been receiving physical therapy at home patient not ambulating much and mostly bed bound.  She was evaluated in the emergency room with CT angiogram of the chest which showed saddle pulmonary embolism.  Patient was started on IV heparin drip for anticoagulation.  No hematemesis, hemoptysis and rectal bleed.  Hospitalist service was consulted for further care.  PAST MEDICAL HISTORY:   Past Medical History:  Diagnosis Date  . Arthritis    hands  . 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 Chronic kidney disease    .  Takes Vasotec for kidneys.  . Depression   . Hyperlipemia   . Hypertension   . Sleep apnea     PAST SURGICAL HISTORY:  Past Surgical History:  Procedure Laterality Date  . ABDOMINAL HYSTERECTOMY    . Buniectomy     bil  . CHOLECYSTECTOMY    . FACIAL COSMETIC SURGERY  2011  . GASTRIC BYPASS  2010  . Rotor cuff     right  . SCLERAL BUCKLE  11/07/2011   Procedure: SCLERAL BUCKLE;  Surgeon: Hayden Pedro, MD;  Location: [place];  Service: Ophthalmology;  Laterality: Left;    SOCIAL HISTORY:  Social History   Tobacco Use  . Smoking status: Never Smoker  . Smokeless tobacco:  Never Used  Substance Use Topics  . Alcohol use: No    FAMILY HISTORY:  Family History  Problem Relation Age of Onset  . Heart disease Mother   . Neuropathy Father   . Breast cancer Neg Hx     DRUG ALLERGIES: No Known Allergies  REVIEW OF SYSTEMS:   CONSTITUTIONAL: No fever, fatigue or weakness.  EYES: No blurred or double vision.  EARS, NOSE, AND THROAT: No tinnitus or ear pain.  RESPIRATORY: No cough, has shortness of breath,  No wheezing or hemoptysis.  CARDIOVASCULAR: No chest pain, orthopnea, edema.  GASTROINTESTINAL: No nausea, vomiting, diarrhea or abdominal pain.  GENITOURINARY: No dysuria, hematuria.  ENDOCRINE: No polyuria, nocturia,  HEMATOLOGY: No anemia, easy bruising or bleeding SKIN: No rash or lesion. MUSCULOSKELETAL: No joint pain or arthritis.   NEUROLOGIC: No tingling, numbness, weakness.  PSYCHIATRY: No anxiety or depression.   MEDICATIONS AT HOME:  Prior to Admission medications   Medication Sig Start Date End Date Taking? Authorizing Provider  busPIRone (BUSPAR) 15 MG tablet Take 1 tablet by mouth 2 (two) times daily. 06/06/17  Yes [provider]  calcitRIOL (ROCALTROL) 0.25 MCG capsule Take 1 capsule by mouth daily. 06/16/17  Yes [provider]  calcium citrate-vitamin D (CITRACAL+D) 315-200 MG-UNIT tablet Take 1 tablet by mouth daily.    Yes  [provider]  enalapril (VASOTEC) 2.5 MG tablet Take 2.5 mg by mouth 2 (two) times daily.   Yes [provider]  FLUoxetine (PROZAC) 20 MG capsule Take 60 mg by mouth daily. 05/20/17  Yes [provider]  oxyCODONE-acetaminophen (PERCOCET) 5-325 MG tablet Take 1 tablet by mouth every 4 (four) hours as needed for severe pain. 05/31/17  Yes Harvest Dark, MD  vitamin B-12 (CYANOCOBALAMIN) 100 MCG tablet Take 100 mcg by mouth daily.    Yes [provider]  azithromycin (ZITHROMAX) 250 MG tablet Take 1 tablet (250 mg total) by mouth daily. Take first 2 tablets  together, then 1 every day until finished. Patient not taking: Reported on 05/31/2017 01/23/16   Marney Setting, NP  diphenhydramine-acetaminophen (TYLENOL PM) 25-500 MG TABS Take 2 tablets by mouth at bedtime as needed.    [provider]  docusate sodium (COLACE) 100 MG capsule Take 1 capsule (100 mg total) by mouth 2 (two) times daily. Patient not taking: Reported on 06/21/2017 05/31/17 06/30/17  Harvest Dark, MD  gatifloxacin (ZYMAXID) 0.5 % SOLN Place 1 drop into the left eye 4 (four) times daily. Patient not taking: Reported on 12/07/2015 11/08/11   Hayden Pedro, MD  ondansetron (ZOFRAN ODT) 4 MG disintegrating tablet Take 1 tablet (4 mg total) by mouth every 8 (eight) hours as needed for nausea or vomiting. 05/31/17   Harvest Dark, MD  valACYclovir (VALTREX) 1000 MG tablet Take 1,000 mg by mouth 2 (two) times daily.    [provider]      PHYSICAL EXAMINATION:   VITAL SIGNS: Blood pressure (!) 146/85, pulse 78, temperature 97.8 F (36.6 C), temperature source Oral, resp. rate 14, height 5\' 7"  (1.702 m), weight 68 kg (150 lb), SpO2 100 %.  GENERAL:  77 y.o.-year-old patient lying in the bed with no acute distress.  EYES: Pupils equal, round, reactive to light and accommodation. No scleral icterus. Extraocular muscles intact.  HEENT: Head atraumatic, normocephalic. Oropharynx and nasopharynx clear.  NECK:  Supple, no jugular venous distention. No thyroid enlargement, no tenderness.  LUNGS: Decreased breath sounds bilaterally, no wheezing, rales,rhonchi or crepitation. No use of accessory muscles of respiration.  CARDIOVASCULAR: S1, S2 normal. No murmurs, rubs, or gallops.  ABDOMEN: Soft, nontender, nondistended. Bowel sounds present. No organomegaly or mass.  EXTREMITIES: No pedal edema, cyanosis, or clubbing.  NEUROLOGIC: Cranial nerves II through XII are intact. Muscle strength 5/5 in all extremities. Sensation intact. Gait not checked.  PSYCHIATRIC: The  patient is alert and oriented x 3.  SKIN: No obvious rash, lesion, or ulcer.   LABORATORY PANEL:   CBC Recent Labs  Lab 06/21/17 1719  WBC 5.6  HGB 12.6  HCT 38.4  PLT 228  MCV 95.6  MCH 31.4  MCHC 32.9  RDW 18.0*  LYMPHSABS 1.4  MONOABS 0.5  EOSABS 0.1  BASOSABS 0.1   ------------------------------------------------------------------------------------------------------------------  Chemistries  Recent Labs  Lab 06/21/17 1719  NA 139  K 4.5  CL 109  CO2 21*  GLUCOSE 99  BUN 32*  CREATININE 1.41*  CALCIUM 9.6  AST 30  ALT 29  ALKPHOS 110  BILITOT 0.8   ------------------------------------------------------------------------------------------------------------------ estimated creatinine clearance is 33.5 mL/min (A) (by C-G formula based on SCr of 1.41 mg/dL (H)). ------------------------------------------------------------------------------------------------------------------ No results for input(s): TSH, T4TOTAL, T3FREE, THYROIDAB in the last 72 hours.  Invalid input(s): FREET3   Coagulation profile No results for input(s): INR, PROTIME in the last 168 hours. ------------------------------------------------------------------------------------------------------------------- No results for input(s): DDIMER  in the last 72 hours. -------------------------------------------------------------------------------------------------------------------  Cardiac Enzymes Recent Labs  Lab 06/21/17 1719  TROPONINI 0.03*   ------------------------------------------------------------------------------------------------------------------ Invalid input(s): POCBNP  ---------------------------------------------------------------------------------------------------------------  Urinalysis    Component Value Date/Time   COLORURINE ORANGE 05/13/2011 1033   APPEARANCEUR CLOUDY 05/13/2011 1033   LABSPEC SEE COMMENT 05/13/2011 1033   PHURINE see comment 05/13/2011 1033    GLUCOSEU see comment 05/13/2011 1033   HGBUR see comment 05/13/2011 1033   BILIRUBINUR SEE COMMENT 05/13/2011 1033   KETONESUR SEE COMMENT 05/13/2011 1033   PROTEINUR SEE COMMENT 05/13/2011 1033   NITRITE SEE COMMENT 05/13/2011 1033   LEUKOCYTESUR SEE COMMENT 05/13/2011 1033     RADIOLOGY: Dg Chest 2 View  Result Date: 06/21/2017 CLINICAL DATA:  Shortness of breath.  Evaluate for pneumonia. EXAM: CHEST  2 VIEW COMPARISON:  None. FINDINGS: The heart size and mediastinal contours are within normal limits. Normal pulmonary vascularity. No focal consolidation, pleural effusion, or pneumothorax. No acute osseous abnormality. IMPRESSION: No active cardiopulmonary disease. Electronically Signed   By: Titus Dubin M.D.   On: 06/21/2017 17:42   Ct Angio Chest Pe W And/or Wo Contrast  Result Date: 06/21/2017 CLINICAL DATA:  Shortness of breath for 3 days with generalized weakness. EXAM: CT ANGIOGRAPHY CHEST WITH CONTRAST TECHNIQUE: Multidetector CT imaging of the chest was performed using the standard protocol during bolus administration of intravenous contrast. Multiplanar CT image reconstructions and MIPs were obtained to evaluate the vascular anatomy. CONTRAST:  29mL ISOVUE-370 IOPAMIDOL (ISOVUE-370) INJECTION 76% COMPARISON:  None. FINDINGS: Cardiovascular: Tiny pericardial effusion noted. Heart size normal. No thoracic aortic aneurysm. Large volume pulmonary embolus identified with saddle configuration crossing the pulmonary artery bifurcation. Occlusive and nearly occlusive pulmonary embolus is seen in lobar and segmental arteries to the lower lobes and right middle lobe. Pulmonary embolus also noted in segmental branches to the left upper lobe. RV/LV ratio is 1.3. Mediastinum/Nodes: No mediastinal lymphadenopathy. There is no hilar lymphadenopathy. The esophagus has normal imaging features. There is no axillary lymphadenopathy. Lungs/Pleura: No dense focal airspace consolidation. Subtle peripheral  ground-glass attenuation seen anterior upper lobes bilaterally with atelectasis noted in the lingula. No pulmonary edema or pleural effusion. No definite pulmonary infarct. 4 mm right upper lobe pulmonary nodule identified on image 43 of series 6. 3 mm right upper lobe pulmonary nodule seen on image 29. 2 mm left apical nodule visible on image 16 with other scattered tiny pulmonary nodules evident. Upper Abdomen: Surgical changes noted in the stomach. Kidneys incompletely visualized with cortical scarring noted in the left kidney. Musculoskeletal: Bone windows reveal no worrisome lytic or sclerotic osseous lesions. Review of the MIP images confirms the above findings. IMPRESSION: 1. Large saddle pulmonary embolus with evidence of right heart strain. Positive for acute PE with CT evidence of right heart strain (RV/LV Ratio = 1.3) consistent with at least submassive (intermediate risk) PE. The presence of right heart strain has been associated with an increased risk of morbidity and mortality. Please activate Code PE by paging [phone]. 2. Tiny bilateral pulmonary nodules measuring up to 4 mm maximum size. No follow-up needed if patient is low-risk (and has no known or suspected primary neoplasm). Non-contrast chest CT can be considered in 12 months if patient is high-risk. This recommendation follows the consensus statement: Guidelines for Management of Incidental Pulmonary Nodules Detected on CT Images: From the Fleischner Society 2017; Radiology 2017; 284:228-243. Critical Value/emergent results were called by me at the time of interpretation on 06/21/2017 at 7:10 pm to Dr. Mable Paris, who  verbally acknowledged these results. Electronically Signed   By: Misty Stanley M.D.   On: 06/21/2017 19:11    EKG: Orders placed or performed during the hospital encounter of 06/21/17  . ED EKG  . ED EKG    IMPRESSION AND PLAN: 77 year old female patient with history of hyperlipidemia, hypertension, arthritis,  squamous cell cancer of the skin presented to the emergency room with difficulty breathing.  Admitting diagnosis 1.  Acute pulmonary embolism 2.  Hypoxia 3.  Dyspnea 4.  Hypertension 5.  Hyperlipidemia Treatment plan Admit patient to medical floor Start patient on IV heparin drip for anticoagulation Check echocardiogram for assessment of pulmonary hypertension Venous Doppler ultrasound of lower extremities to assess for DVT Oxygen via nasal cannula Pain management with IV morphine as needed Vascular surgery consultation for evaluation for any thrombectomy   All the records are reviewed and case discussed with ED provider. Management plans discussed with the patient, family and they are in agreement.  CODE STATUS:FULL CODE Code Status History    This patient does not have a recorded code status. Please follow your organizational policy for patients in this situation.    Advance Directive Documentation     Most Recent Value  Type of Advance Directive  Living will, Healthcare Power of Attorney  Pre-existing out of facility DNR order (yellow form or pink MOST form)  No data  "MOST" Form in Place?  No data       TOTAL TIME TAKING CARE OF THIS PATIENT: 51 minutes.    Saundra Shelling M.D on 06/21/2017 at 8:10 PM  Between 7am to 6pm - Pager - [phone]  After 6pm go to [email or web address] - password [email or web address] [facility]  Office  [phone]  CC: Primary care physician; Idelle Crouch, MD

## 2017-06-21 NOTE — Progress Notes (Signed)
ANTICOAGULATION CONSULT NOTE - Initial Consult  Pharmacy Consult for heparin gtt Indication: pulmonary embolus  No Known Allergies  Patient Measurements: Height: 5\' 7"  (170.2 cm) Weight: 150 lb (68 kg) IBW/kg (Calculated) : 61.6 Heparin Dosing Weight: 68kg  Vital Signs: Temp: 97.8 F (36.6 C) (02/28 1649) Temp Source: Oral (02/28 1649) BP: 146/85 (02/28 1830) Pulse Rate: 78 (02/28 1830)  Labs: Recent Labs    06/21/17 1719  HGB 12.6  HCT 38.4  PLT 228  CREATININE 1.41*  TROPONINI 0.03*    Estimated Creatinine Clearance: 33.5 mL/min (A) (by C-G formula based on SCr of 1.41 mg/dL (H)).   Medical History: Past Medical History:  Diagnosis Date  . Arthritis    hands  . 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 Chronic kidney disease    .  Takes Vasotec for kidneys.  . Depression   . Hyperlipemia   . Hypertension   . Sleep apnea     Medications:   (Not in a hospital admission) Scheduled:  . acetaminophen  1,000 mg Oral Once  . heparin  4,000 Units Intravenous Once   Infusions:  . heparin     PRN:  Anti-infectives (From admission, onward)   None      Assessment: 77 year old female with PE, Pharmacy consulted for pe dosing of heparin gtt per protocol.  Goal of Therapy:  Heparin level 0.3-0.7 units/ml Monitor platelets by anticoagulation protocol: Yes   Plan:  Give 4000 units bolus x 1 Start heparin infusion at 1100 units/hr Check anti-Xa level in 6 hours and daily while on heparin Continue to monitor H&H and platelets  Donna Christen Martese Vanatta 06/21/2017,7:36 PM

## 2017-06-21 NOTE — ED Triage Notes (Signed)
Pt arrived via EMS from home d/t progressive SOB x3 days and generalized weakness x2 wks. Pt reports fractured sacrum d/t a fall 3 wks ago. Pt has hx of renal failure. Pt is A&O x4 at this time and in NAD.

## 2017-06-21 NOTE — ED Notes (Signed)
Attempted report; was told nurse would call back. 

## 2017-06-22 ENCOUNTER — Inpatient Hospital Stay: Payer: Medicare Other

## 2017-06-22 ENCOUNTER — Encounter
Admission: EM | Disposition: A | Discharge status: Home Health | Payer: Self-pay | Source: Home / Self Care | Attending: Internal Medicine

## 2017-06-22 DIAGNOSIS — R918 Other nonspecific abnormal finding of lung field: Secondary | ICD-10-CM

## 2017-06-22 DIAGNOSIS — N183 Chronic kidney disease, stage 3 (moderate): Secondary | ICD-10-CM

## 2017-06-22 DIAGNOSIS — N189 Chronic kidney disease, unspecified: Secondary | ICD-10-CM

## 2017-06-22 DIAGNOSIS — I10 Essential (primary) hypertension: Secondary | ICD-10-CM

## 2017-06-22 DIAGNOSIS — R531 Weakness: Secondary | ICD-10-CM

## 2017-06-22 DIAGNOSIS — I2699 Other pulmonary embolism without acute cor pulmonale: Secondary | ICD-10-CM

## 2017-06-22 DIAGNOSIS — I2602 Saddle embolus of pulmonary artery with acute cor pulmonale: Principal | ICD-10-CM

## 2017-06-22 DIAGNOSIS — R0602 Shortness of breath: Secondary | ICD-10-CM

## 2017-06-22 DIAGNOSIS — I2609 Other pulmonary embolism with acute cor pulmonale: Secondary | ICD-10-CM

## 2017-06-22 HISTORY — PX: PULMONARY THROMBECTOMY: CATH118295

## 2017-06-22 LAB — HEPARIN LEVEL (UNFRACTIONATED)
HEPARIN UNFRACTIONATED: 0.82 [IU]/mL — AB (ref 0.30–0.70)
HEPARIN UNFRACTIONATED: 1.08 [IU]/mL — AB (ref 0.30–0.70)
HEPARIN UNFRACTIONATED: 1 [IU]/mL — AB (ref 0.30–0.70)
Heparin Unfractionated: 0.55 IU/mL (ref 0.30–0.70)

## 2017-06-22 LAB — CBC
HEMATOCRIT: 33 % — AB (ref 35.0–47.0)
Hemoglobin: 11.1 g/dL — ABNORMAL LOW (ref 12.0–16.0)
MCH: 31.6 pg (ref 26.0–34.0)
MCHC: 33.5 g/dL (ref 32.0–36.0)
MCV: 94.5 fL (ref 80.0–100.0)
PLATELETS: 206 10*3/uL (ref 150–440)
RBC: 3.5 MIL/uL — ABNORMAL LOW (ref 3.80–5.20)
RDW: 18 % — ABNORMAL HIGH (ref 11.5–14.5)
WBC: 8.2 10*3/uL (ref 3.6–11.0)

## 2017-06-22 LAB — BASIC METABOLIC PANEL
Anion gap: 7 (ref 5–15)
BUN: 31 mg/dL — ABNORMAL HIGH (ref 6–20)
CHLORIDE: 113 mmol/L — AB (ref 101–111)
CO2: 19 mmol/L — ABNORMAL LOW (ref 22–32)
CREATININE: 1.34 mg/dL — AB (ref 0.44–1.00)
Calcium: 8.8 mg/dL — ABNORMAL LOW (ref 8.9–10.3)
GFR calc Af Amer: 44 mL/min — ABNORMAL LOW (ref >60)
GFR calc non Af Amer: 38 mL/min — ABNORMAL LOW (ref >60)
Glucose, Bld: 100 mg/dL — ABNORMAL HIGH (ref 65–99)
Potassium: 4.1 mmol/L (ref 3.5–5.1)
SODIUM: 139 mmol/L (ref 135–145)

## 2017-06-22 SURGERY — PULMONARY THROMBECTOMY
Anesthesia: Moderate Sedation | Laterality: Bilateral

## 2017-06-22 MED ORDER — VALACYCLOVIR HCL 500 MG PO TABS
1000.0000 mg | ORAL_TABLET | Freq: Two times a day (BID) | ORAL | Status: DC
  Administered 2017-06-22: 1000 mg via ORAL
  Filled 2017-06-22 (×2): qty 2

## 2017-06-22 MED ORDER — SODIUM CHLORIDE 0.9 % IV SOLN
INTRAVENOUS | Status: DC
  Administered 2017-06-22: 13:00:00 via INTRAVENOUS

## 2017-06-22 MED ORDER — ALTEPLASE 2 MG IJ SOLR
INTRAMUSCULAR | Status: AC
Start: 2017-06-22 — End: 2017-06-22
  Filled 2017-06-22: qty 10

## 2017-06-22 MED ORDER — CEFAZOLIN SODIUM-DEXTROSE 2-4 GM/100ML-% IV SOLN
2.0000 g | INTRAVENOUS | Status: DC
  Filled 2017-06-22: qty 100

## 2017-06-22 MED ORDER — MIDAZOLAM HCL 2 MG/2ML IJ SOLN
INTRAMUSCULAR | Status: DC | PRN
  Administered 2017-06-22 (×2): 2 mg via INTRAVENOUS

## 2017-06-22 MED ORDER — FENTANYL CITRATE (PF) 100 MCG/2ML IJ SOLN
INTRAMUSCULAR | Status: AC
  Filled 2017-06-22: qty 2

## 2017-06-22 MED ORDER — ALTEPLASE 2 MG IJ SOLR
INTRAMUSCULAR | Status: DC | PRN
  Administered 2017-06-22: 10 mg

## 2017-06-22 MED ORDER — LIDOCAINE-EPINEPHRINE (PF) 1 %-1:200000 IJ SOLN
INTRAMUSCULAR | Status: AC
  Filled 2017-06-22: qty 30

## 2017-06-22 MED ORDER — HEPARIN SODIUM (PORCINE) 1000 UNIT/ML IJ SOLN
INTRAMUSCULAR | Status: DC | PRN
  Administered 2017-06-22: 3000 [IU] via INTRAVENOUS

## 2017-06-22 MED ORDER — FENTANYL CITRATE (PF) 100 MCG/2ML IJ SOLN
INTRAMUSCULAR | Status: DC | PRN
  Administered 2017-06-22 (×2): 50 ug via INTRAVENOUS

## 2017-06-22 MED ORDER — MIDAZOLAM HCL 2 MG/2ML IJ SOLN
INTRAMUSCULAR | Status: AC
  Filled 2017-06-22: qty 4

## 2017-06-22 MED ORDER — HEPARIN (PORCINE) IN NACL 2-0.9 UNIT/ML-% IJ SOLN
INTRAMUSCULAR | Status: AC
  Filled 2017-06-22: qty 1000

## 2017-06-22 MED ORDER — IOPAMIDOL (ISOVUE-300) INJECTION 61%
INTRAVENOUS | Status: DC | PRN
  Administered 2017-06-22: 45 mL via INTRAVENOUS

## 2017-06-22 MED ORDER — HEPARIN SODIUM (PORCINE) 1000 UNIT/ML IJ SOLN
INTRAMUSCULAR | Status: AC
  Filled 2017-06-22: qty 1

## 2017-06-22 MED ORDER — LIDOCAINE-EPINEPHRINE (PF) 1 %-1:200000 IJ SOLN
INTRAMUSCULAR | Status: DC | PRN
  Administered 2017-06-22: 6 mL

## 2017-06-22 MED ORDER — HEPARIN (PORCINE) IN NACL 100-0.45 UNIT/ML-% IJ SOLN
750.0000 [IU]/h | INTRAMUSCULAR | Status: DC
  Administered 2017-06-22: 750 [IU]/h via INTRAVENOUS
  Filled 2017-06-22: qty 250

## 2017-06-22 MED ORDER — VALACYCLOVIR HCL 500 MG PO TABS
1000.0000 mg | ORAL_TABLET | Freq: Two times a day (BID) | ORAL | Status: DC | PRN
  Filled 2017-06-22: qty 2

## 2017-06-22 SURGICAL SUPPLY — 12 items
CANISTER PENUMBRA MAX (MISCELLANEOUS) ×3 IMPLANT
CATH INDIGO 8 TORQ TIP 85CM (CATHETERS) ×3 IMPLANT
CATH INFINITI JR4 5F (CATHETERS) ×3 IMPLANT
CATH PIG 70CM (CATHETERS) ×3 IMPLANT
DEVICE SAFEGUARD 24CM (GAUZE/BANDAGES/DRESSINGS) ×3 IMPLANT
PACK ANGIOGRAPHY (CUSTOM PROCEDURE TRAY) ×3 IMPLANT
SHEATH BRITE TIP 8FRX11 (SHEATH) ×3 IMPLANT
SYR MEDRAD MARK V 150ML (SYRINGE) ×3 IMPLANT
TUBING ASPIRATION INDIGO (MISCELLANEOUS) ×3 IMPLANT
TUBING CONTRAST HIGH PRESS 72 (TUBING) ×3 IMPLANT
WIRE J 3MM .035X145CM (WIRE) ×3 IMPLANT
WIRE MAGIC TOR.035 180C (WIRE) ×3 IMPLANT

## 2017-06-22 NOTE — Progress Notes (Signed)
ANTICOAGULATION CONSULT NOTE - Initial Consult  Pharmacy Consult for heparin gtt Indication: pulmonary embolus  No Known Allergies  Patient Measurements: Height: 5\' 7"  (170.2 cm) Weight: 150 lb (68 kg) IBW/kg (Calculated) : 61.6 Heparin Dosing Weight: 68kg  Vital Signs: Temp: 97.7 F (36.5 C) (02/28 2140) Temp Source: Oral (02/28 2140) BP: 100/61 (02/28 2316) Pulse Rate: 88 (02/28 2316)  Labs: Recent Labs    06/21/17 1719 06/21/17 2219 06/22/17 0159  HGB 12.6  --  11.1*  HCT 38.4  --  33.0*  PLT 228  --  206  APTT  --  157*  --   LABPROT  --  15.0  --   INR  --  1.19  --   HEPARINUNFRC  --   --  0.82*  CREATININE 1.41*  --  1.34*  TROPONINI 0.03*  --   --     Estimated Creatinine Clearance: 35.3 mL/min (A) (by C-G formula based on SCr of 1.34 mg/dL (H)).   Medical History: Past Medical History:  Diagnosis Date  . Arthritis    hands  . 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 Chronic kidney disease    .  Takes Vasotec for kidneys.  . Depression   . Hyperlipemia   . Hypertension   . Sleep apnea     Medications:  Medications Prior to Admission  Medication Sig Dispense Refill Last Dose  . busPIRone (BUSPAR) 15 MG tablet Take 1 tablet by mouth 2 (two) times daily.   06/21/2017 at Unknown time  . calcitRIOL (ROCALTROL) 0.25 MCG capsule Take 1 capsule by mouth daily.   06/21/2017 at Unknown time  . calcium citrate-vitamin D (CITRACAL+D) 315-200 MG-UNIT tablet Take 1 tablet by mouth daily.    06/21/2017 at Unknown time  . enalapril (VASOTEC) 2.5 MG tablet Take 2.5 mg by mouth 2 (two) times daily.   06/21/2017 at Unknown time  . FLUoxetine (PROZAC) 20 MG capsule Take 60 mg by mouth daily.   06/21/2017 at Unknown time  . oxyCODONE-acetaminophen (PERCOCET) 5-325 MG tablet Take 1 tablet by mouth every 4 (four) hours as needed for severe pain. 20 tablet 0 Past Week at Unknown time  . vitamin B-12 (CYANOCOBALAMIN) 100 MCG tablet Take 100 mcg by mouth  daily.    06/21/2017 at Unknown time  . azithromycin (ZITHROMAX) 250 MG tablet Take 1 tablet (250 mg total) by mouth daily. Take first 2 tablets together, then 1 every day until finished. (Patient not taking: Reported on 05/31/2017) 6 tablet 0 Not Taking at Unknown time  . diphenhydramine-acetaminophen (TYLENOL PM) 25-500 MG TABS Take 2 tablets by mouth at bedtime as needed.   prn at prn  . docusate sodium (COLACE) 100 MG capsule Take 1 capsule (100 mg total) by mouth 2 (two) times daily. (Patient not taking: Reported on 06/21/2017) 60 capsule 0 Not Taking at Unknown time  . gatifloxacin (ZYMAXID) 0.5 % SOLN Place 1 drop into the left eye 4 (four) times daily. (Patient not taking: Reported on 12/07/2015)   Not Taking at Unknown time  . ondansetron (ZOFRAN ODT) 4 MG disintegrating tablet Take 1 tablet (4 mg total) by mouth every 8 (eight) hours as needed for nausea or vomiting. 20 tablet 0 prn at prn  . valACYclovir (VALTREX) 1000 MG tablet Take 1,000 mg by mouth 2 (two) times daily.   prn at prn   Scheduled:  . busPIRone  15 mg Oral BID  . calcitRIOL  0.25  mcg Oral Daily  . calcium citrate  1 tablet Oral Daily  . enalapril  2.5 mg Oral BID  . FLUoxetine  60 mg Oral Daily  . sodium chloride flush  3 mL Intravenous Q12H  . vitamin B-12  100 mcg Oral Daily   Infusions:  . sodium chloride    . heparin 1,100 Units/hr (06/21/17 2042)   PRN:  Anti-infectives (From admission, onward)   None      Assessment: 77 year old female with PE, Pharmacy consulted for pe dosing of heparin gtt per protocol.  Goal of Therapy:  Heparin level 0.3-0.7 units/ml Monitor platelets by anticoagulation protocol: Yes   Plan:  Give 4000 units bolus x 1 Start heparin infusion at 1100 units/hr Check anti-Xa level in 6 hours and daily while on heparin Continue to monitor H&H and platelets   03/01 @ 0200 HL 0.82 supratherapeutic. Will decrease rate to 950 units/hr and will recheck HL @ 1100. CBC trending down, will  continue to monitor.  Tobie Lords, PharmD, BCPS Clinical Pharmacist 06/22/2017

## 2017-06-22 NOTE — Plan of Care (Signed)
  Progressing Education: Knowledge of General Education information will improve 06/22/2017 1739 - Progressing by Rolley Sims, RN Health Behavior/Discharge Planning: Ability to manage health-related needs will improve 06/22/2017 1739 - Progressing by Rolley Sims, RN Clinical Measurements: Respiratory complications will improve 06/22/2017 1739 - Progressing by Rolley Sims, RN Safety: Ability to remain free from injury will improve 06/22/2017 1739 - Progressing by Rolley Sims, RN

## 2017-06-22 NOTE — Consult Note (Signed)
[facility] Consult Note  MRN : [id number]  Ann Russell is a 77 y.o. (1940-05-30) female who presents with chief complaint of  Chief Complaint  Patient presents with  . Shortness of Breath  . Generalized Body Aches  .  History of Present Illness: I am asked to see the patient by Dr. Benjie Karvonen for large saddle PE.  The patient was admitted late last night after 2-3 days of worsening shortness of breath.  She was found to be hypoxic.  She had a CT scan of the chest which I have independently reviewed which shows a large, saddle pulmonary emboli involving both lungs.  This is associated with right heart strain radiographically.  She continues to be short of breath at rest and basically minimal activity leads to profound weakness and shortness of breath.  She still has an oxygen requirement and is labored with just conversation.  She reports having had a sacral fracture about 3 weeks ago and basically being bedbound since that time.  No previous history of clots or bleeding issues.  No evidence of bleeding since she was placed on heparin last night.  Does have some chest pain.  Current Facility-Administered Medications  Medication Dose Route Frequency Provider Last Rate Last Dose  . 0.9 %  sodium chloride infusion  250 mL Intravenous PRN Pyreddy, Pavan, MD      . 0.9 %  sodium chloride infusion   Intravenous Continuous Riggins Cisek, Erskine Squibb, MD      . acetaminophen (TYLENOL) tablet 650 mg  650 mg Oral Q6H PRN Saundra Shelling, MD       Or  . acetaminophen (TYLENOL) suppository 650 mg  650 mg Rectal Q6H PRN Pyreddy, Reatha Harps, MD      . busPIRone (BUSPAR) tablet 15 mg  15 mg Oral BID Saundra Shelling, MD   15 mg at 06/22/17 0914  . calcitRIOL (ROCALTROL) capsule 0.25 mcg  0.25 mcg Oral Daily Pyreddy, Reatha Harps, MD   0.25 mcg at 06/22/17 0913  . calcium citrate (CALCITRATE - dosed in mg elemental calcium) tablet 200 mg of elemental calcium  1 tablet Oral Daily Pyreddy, Pavan, MD   200 mg  of elemental calcium at 06/22/17 0914  . ceFAZolin (ANCEF) IVPB 2g/100 mL premix  2 g Intravenous 30 min Pre-Op Algernon Huxley, MD      . enalapril (VASOTEC) tablet 2.5 mg  2.5 mg Oral BID Saundra Shelling, MD   2.5 mg at 06/22/17 0913  . FLUoxetine (PROZAC) capsule 60 mg  60 mg Oral Daily Pyreddy, Pavan, MD   60 mg at 06/22/17 0914  . heparin ADULT infusion 100 units/mL (25000 units/257mL sodium chloride 0.45%)  950 Units/hr Intravenous Continuous Saundra Shelling, MD 9.5 mL/hr at 06/22/17 0307 950 Units/hr at 06/22/17 0307  . morphine 2 MG/ML injection 2 mg  2 mg Intravenous Q4H PRN Saundra Shelling, MD   2 mg at 06/21/17 1946  . ondansetron (ZOFRAN) tablet 4 mg  4 mg Oral Q6H PRN Pyreddy, Reatha Harps, MD       Or  . ondansetron (ZOFRAN) injection 4 mg  4 mg Intravenous Q6H PRN Pyreddy, Pavan, MD      . oxyCODONE-acetaminophen (PERCOCET/ROXICET) 5-325 MG per tablet 1 tablet  1 tablet Oral Q4H PRN Pyreddy, Pavan, MD      . senna-docusate (Senokot-S) tablet 1 tablet  1 tablet Oral QHS PRN Pyreddy, Pavan, MD      . sodium chloride flush (NS) 0.9 % injection 3 mL  3 mL Intravenous Q12H Saundra Shelling, MD   3 mL at 06/22/17 0915  . sodium chloride flush (NS) 0.9 % injection 3 mL  3 mL Intravenous PRN Pyreddy, Pavan, MD      . valACYclovir (VALTREX) tablet 1,000 mg  1,000 mg Oral BID Mody, Sital, MD      . vitamin B-12 (CYANOCOBALAMIN) tablet 100 mcg  100 mcg Oral Daily Saundra Shelling, MD   100 mcg at 06/22/17 8016    Past Medical History:  Diagnosis Date  . Arthritis    hands  . 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 Chronic kidney disease    .  Takes Vasotec for kidneys.  . Depression   . Hyperlipemia   . Hypertension   . Sleep apnea     Past Surgical History:  Procedure Laterality Date  . ABDOMINAL HYSTERECTOMY    . Buniectomy     bil  . CHOLECYSTECTOMY    . FACIAL COSMETIC SURGERY  2011  . GASTRIC BYPASS  2010  . Rotor cuff     right  . SCLERAL BUCKLE  11/07/2011    Procedure: SCLERAL BUCKLE;  Surgeon: Hayden Pedro, MD;  Location: [facility];  Service: Ophthalmology;  Laterality: Left;    Social History Social History   Tobacco Use  . Smoking status: Never Smoker  . Smokeless tobacco: Never Used  Substance Use Topics  . Alcohol use: No  . Drug use: No    Family History Family History  Problem Relation Age of Onset  . Heart disease Mother   . Neuropathy Father   . Breast cancer Neg Hx   no bleeding or clotting disorders  No Known Allergies   REVIEW OF SYSTEMS (Negative unless checked)  Constitutional: [] Weight loss  [] Fever  [] Chills Cardiac: [] Chest pain   [x] Chest pressure   [] Palpitations   [x] Shortness of breath when laying flat   [x] Shortness of breath at rest   [x] Shortness of breath with exertion. Vascular:  [] Pain in legs with walking   [] Pain in legs at rest   [] Pain in legs when laying flat   [] Claudication   [] Pain in feet when walking  [] Pain in feet at rest  [] Pain in feet when laying flat   [] History of DVT   [] Phlebitis   [] Swelling in legs   [] Varicose veins   [] Non-healing ulcers Pulmonary:   [] Uses home oxygen   [] Productive cough   [] Hemoptysis   [] Wheeze  [] COPD   [] Asthma Neurologic:  [] Dizziness  [] Blackouts   [] Seizures   [] History of stroke   [] History of TIA  [] Aphasia   [] Temporary blindness   [] Dysphagia   [] Weakness or numbness in arms   [] Weakness or numbness in legs Musculoskeletal:  [x] Arthritis   [] Joint swelling   [] Joint pain   [x] Low back pain Hematologic:  [] Easy bruising  [] Easy bleeding   [] Hypercoagulable state   [] Anemic  [] Hepatitis Gastrointestinal:  [] Blood in stool   [] Vomiting blood  [] Gastroesophageal reflux/heartburn   [] Difficulty swallowing. Genitourinary:  [x] Chronic kidney disease   [] Difficult urination  [] Frequent urination  [] Burning with urination   [] Blood in urine Skin:  [] Rashes   [] Ulcers   [] Wounds Psychological:  [] History of anxiety   [x]  History of major depression.  Physical  Examination  Vitals:   06/21/17 2316 06/22/17 0459 06/22/17 0743 06/22/17 0930  BP: 100/61 (!) 137/94 (!) 174/88 124/72  Pulse: 88 84 80 95  Resp:  18 18 14  Temp:  97.8 F (36.6 C)    TempSrc:  Oral    SpO2: 94% 93% 97% 96%  Weight:  76.3 kg (168 lb 3.2 oz)    Height:       Body mass index is 26.34 kg/m. Gen:  WD/WN, NAD Head: [place]/AT, No temporalis wasting.  Ear/Nose/Throat: Hearing grossly intact, nares w/o erythema or drainage, oropharynx w/o Erythema/Exudate Eyes: Sclera non-icteric, conjunctiva clear Neck: Trachea midline.  No JVD.  Pulmonary:  Good air movement, respirations are labored on supplemental oxygen.  Has difficulty getting her breath even with conversation Cardiac: Regular rhythm, slightly tachycardic Vascular:  Vessel Right Left  Radial Palpable Palpable                                    Musculoskeletal: M/S 5/5 throughout.  Extremities without ischemic changes.  No deformity or atrophy. No edema. Neurologic: Sensation grossly intact in extremities.  Symmetrical.  Speech is fluent. Motor exam as listed above. Psychiatric: Judgment intact, Mood & affect appropriate for pt's clinical situation. Dermatologic: No rashes or ulcers noted.  No cellulitis or open wounds.       CBC Lab Results  Component Value Date   WBC 8.2 06/22/2017   HGB 11.1 (L) 06/22/2017   HCT 33.0 (L) 06/22/2017   MCV 94.5 06/22/2017   PLT 206 06/22/2017    BMET    Component Value Date/Time   NA 139 06/22/2017 0159   K 4.1 06/22/2017 0159   CL 113 (H) 06/22/2017 0159   CO2 19 (L) 06/22/2017 0159   GLUCOSE 100 (H) 06/22/2017 0159   BUN 31 (H) 06/22/2017 0159   CREATININE 1.34 (H) 06/22/2017 0159   CALCIUM 8.8 (L) 06/22/2017 0159   GFRNONAA 38 (L) 06/22/2017 0159   GFRAA 44 (L) 06/22/2017 0159   Estimated Creatinine Clearance: 38.7 mL/min (A) (by C-G formula based on SCr of 1.34 mg/dL (H)).  COAG Lab Results  Component Value Date   INR 1.19 06/21/2017     Radiology Dg Chest 2 View  Result Date: 06/21/2017 CLINICAL DATA:  Shortness of breath.  Evaluate for pneumonia. EXAM: CHEST  2 VIEW COMPARISON:  None. FINDINGS: The heart size and mediastinal contours are within normal limits. Normal pulmonary vascularity. No focal consolidation, pleural effusion, or pneumothorax. No acute osseous abnormality. IMPRESSION: No active cardiopulmonary disease. Electronically Signed   By: Titus Dubin M.D.   On: 06/21/2017 17:42   Dg Sacrum/coccyx  Result Date: 05/31/2017 CLINICAL DATA:  [place] 3 days ago with coccygeal pain EXAM: SACRUM AND COCCYX - 2+ VIEW COMPARISON:  None. FINDINGS: No sacrococcygeal fracture is seen. Alignment is normal. The bones are osteopenic. The SI joints appear corticated. The pelvic rami are intact. The sacral foramina appear corticated. IMPRESSION: No acute fracture.  Diffuse osteopenia. Electronically Signed   By: Ivar Drape M.D.   On: 05/31/2017 11:24   Ct Angio Chest Pe W And/or Wo Contrast  Result Date: 06/21/2017 CLINICAL DATA:  Shortness of breath for 3 days with generalized weakness. EXAM: CT ANGIOGRAPHY CHEST WITH CONTRAST TECHNIQUE: Multidetector CT imaging of the chest was performed using the standard protocol during bolus administration of intravenous contrast. Multiplanar CT image reconstructions and MIPs were obtained to evaluate the vascular anatomy. CONTRAST:  40mL ISOVUE-370 IOPAMIDOL (ISOVUE-370) INJECTION 76% COMPARISON:  None. FINDINGS: Cardiovascular: Tiny pericardial effusion noted. Heart size normal. No thoracic aortic aneurysm. Large volume pulmonary embolus identified with saddle  configuration crossing the pulmonary artery bifurcation. Occlusive and nearly occlusive pulmonary embolus is seen in lobar and segmental arteries to the lower lobes and right middle lobe. Pulmonary embolus also noted in segmental branches to the left upper lobe. RV/LV ratio is 1.3. Mediastinum/Nodes: No mediastinal lymphadenopathy. There is  no hilar lymphadenopathy. The esophagus has normal imaging features. There is no axillary lymphadenopathy. Lungs/Pleura: No dense focal airspace consolidation. Subtle peripheral ground-glass attenuation seen anterior upper lobes bilaterally with atelectasis noted in the lingula. No pulmonary edema or pleural effusion. No definite pulmonary infarct. 4 mm right upper lobe pulmonary nodule identified on image 43 of series 6. 3 mm right upper lobe pulmonary nodule seen on image 29. 2 mm left apical nodule visible on image 16 with other scattered tiny pulmonary nodules evident. Upper Abdomen: Surgical changes noted in the stomach. Kidneys incompletely visualized with cortical scarring noted in the left kidney. Musculoskeletal: Bone windows reveal no worrisome lytic or sclerotic osseous lesions. Review of the MIP images confirms the above findings. IMPRESSION: 1. Large saddle pulmonary embolus with evidence of right heart strain. Positive for acute PE with CT evidence of right heart strain (RV/LV Ratio = 1.3) consistent with at least submassive (intermediate risk) PE. The presence of right heart strain has been associated with an increased risk of morbidity and mortality. Please activate Code PE by paging [phone]. 2. Tiny bilateral pulmonary nodules measuring up to 4 mm maximum size. No follow-up needed if patient is low-risk (and has no known or suspected primary neoplasm). Non-contrast chest CT can be considered in 12 months if patient is high-risk. This recommendation follows the consensus statement: Guidelines for Management of Incidental Pulmonary Nodules Detected on CT Images: From the Fleischner Society 2017; Radiology 2017; 284:228-243. Critical Value/emergent results were called by me at the time of interpretation on 06/21/2017 at 7:10 pm to Dr. Mable Paris, who verbally acknowledged these results. Electronically Signed   By: Misty Stanley M.D.   On: 06/21/2017 19:11   Ct Pelvis Wo Contrast  Result Date:  05/31/2017 CLINICAL DATA:  Pelvic fracture, known or suspected. Fall 3 days ago with persistent lower back pain. EXAM: CT PELVIS WITHOUT CONTRAST TECHNIQUE: Multidetector CT imaging of the pelvis was performed following the standard protocol without intravenous contrast. COMPARISON:  Radiography earlier today FINDINGS: Urinary Tract:  Negative bladder. Bowel:  No evidence of injury to the visualized bowel. Vascular/Lymphatic: Negative Reproductive:  Hysterectomy. Musculoskeletal: There is a horizontal fracture through the posterior elements of the mid sacrum, at the S3-4 level, see sagittal reformats. On coronal reformats fracture through the lower sacral body/ala is seen on 6:48. No displacement along the sacral foramina or canal. No major muscular ligamentous injury is seen. Lower lumbar facet spurring.  Both hips are located and intact. IMPRESSION: Nondisplaced horizontal fracture through the lower sacrum. Electronically Signed   By: Monte Fantasia M.D.   On: 05/31/2017 12:21   Dg Shoulder Left  Result Date: 06/22/2017 CLINICAL DATA:  Left shoulder pain after fall 3 weeks prior. EXAM: LEFT SHOULDER - 2+ VIEW COMPARISON:  None. FINDINGS: No fracture. No left glenohumeral dislocation. No evidence of left acromioclavicular separation. No suspicious focal osseous lesions. Mild osteoarthritis in the left glenohumeral and left acromioclavicular joints. Tiny clustered 4-5 mm densities overlying the proximal left humerus, cannot exclude loose intra-articular bodies. IMPRESSION: No fracture or dislocation. 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 Tiny clustered 4-5 mm densities overlying the proximal left humerus, cannot exclude loose intra-articular bodies. Electronically Signed  By: Ilona Sorrel M.D.   On: 06/22/2017 12:06   Dg Hip Unilat W Or Wo Pelvis 2-3 Views Left  Result Date: 05/31/2017 CLINICAL DATA:  [place] 3 days ago, coccygeal pain EXAM: DG HIP (WITH OR WITHOUT PELVIS) 2-3V LEFT  COMPARISON:  None. FINDINGS: The bones are diffusely osteopenic. The pelvic rami are intact. The SI joints appear corticated. No fracture is seen. IMPRESSION: No fracture.  Diffuse osteopenia. Electronically Signed   By: Ivar Drape M.D.   On: 05/31/2017 11:23      Assessment/Plan 1.  Large, bilateral pulmonary emboli.  Saddle type.  With evidence of right heart strain on the CT scan.  I have reviewed the CT scan.  The patient is highly symptomatic and still short of breath on oxygen.  I believe she would benefit from a pulmonary thrombectomies and thrombolysis.  I have discussed the risks and benefits including major bleeding, cardiopulmonary complications including malignant arrhythmias, and contrast nephropathy.  The patient desires to proceed and we will perform this this afternoon 2.  Acute hypoxic respiratory failure.  Likely secondary to #1.  Still requiring significant oxygen with shortness of breath 3.  Sacral fracture.  The decreased mobility secondary to this was likely the nidus for clot formation. 4.  Hypertension.  Stable on outpatient medications and blood pressure control important in reducing the progression of atherosclerotic disease. On appropriate oral medications. 5.  CKD stage III.  Creatinine is at baseline.  Continue hydration.  Will try to limit contrast as much as possible with thrombectomy.   Leotis Pain, MD  06/22/2017 1:14 PM    This note was created with Dragon medical transcription system.  Any error is purely unintentional

## 2017-06-22 NOTE — Progress Notes (Signed)
PT Cancellation Note  Patient Details Name: Ann Russell MRN: [id number] DOB: [birth year]   Cancelled Treatment:    Reason Eval/Treat Not Completed: Other (comment). Consult received and chart reviewed. Pt with positive B PE. Started anti coagulation at 0307 this date. Pt currently off unit for thrombectomy. Unable to perform consult. Will re-attempt next date.   Sydnee Lamour 06/22/2017, 2:16 PM Greggory Stallion, PT, DPT [phone]

## 2017-06-22 NOTE — H&P (Signed)
[facility] History & Physical Update  The patient was interviewed and re-examined.  The patient's previous History and Physical has been reviewed and is unchanged.  There is no change in the plan of care. We plan to proceed with the scheduled procedure.  Leotis Pain, MD  06/22/2017, 1:45 PM

## 2017-06-22 NOTE — Progress Notes (Signed)
ANTICOAGULATION CONSULT NOTE - Initial Consult  Pharmacy Consult for heparin gtt Indication: pulmonary embolus  No Known Allergies  Patient Measurements: Height: 5\' 7"  (170.2 cm) Weight: 168 lb 3.2 oz (76.3 kg) IBW/kg (Calculated) : 61.6 Heparin Dosing Weight: 68kg  Vital Signs: Temp: 98.2 F (36.8 C) (03/01 1950) Temp Source: Oral (03/01 1950) BP: 130/70 (03/01 2110) Pulse Rate: 78 (03/01 2110)  Labs: Recent Labs    06/21/17 1719 06/21/17 2219  06/22/17 0159 06/22/17 1106 06/22/17 1908 06/22/17 2043  HGB 12.6  --   --  11.1*  --   --   --   HCT 38.4  --   --  33.0*  --   --   --   PLT 228  --   --  206  --   --   --   APTT  --  157*  --   --   --   --   --   LABPROT  --  15.0  --   --   --   --   --   INR  --  1.19  --   --   --   --   --   HEPARINUNFRC  --   --    < > 0.82* 0.55 1.08* 1.00*  CREATININE 1.41*  --   --  1.34*  --   --   --   TROPONINI 0.03*  --   --   --   --   --   --    < > = values in this interval not displayed.    Estimated Creatinine Clearance: 38.7 mL/min (A) (by C-G formula based on SCr of 1.34 mg/dL (H)).   Medical History: Past Medical History:  Diagnosis Date  . Arthritis    hands  . 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 Chronic kidney disease    .  Takes Vasotec for kidneys.  . Depression   . Hyperlipemia   . Hypertension   . Sleep apnea     Medications:  Medications Prior to Admission  Medication Sig Dispense Refill Last Dose  . busPIRone (BUSPAR) 15 MG tablet Take 1 tablet by mouth 2 (two) times daily.   06/21/2017 at Unknown time  . calcitRIOL (ROCALTROL) 0.25 MCG capsule Take 1 capsule by mouth daily.   06/21/2017 at Unknown time  . calcium citrate-vitamin D (CITRACAL+D) 315-200 MG-UNIT tablet Take 1 tablet by mouth daily.    06/21/2017 at Unknown time  . enalapril (VASOTEC) 2.5 MG tablet Take 2.5 mg by mouth 2 (two) times daily.   06/21/2017 at Unknown time  . FLUoxetine (PROZAC) 20 MG capsule Take 60  mg by mouth daily.   06/21/2017 at Unknown time  . oxyCODONE-acetaminophen (PERCOCET) 5-325 MG tablet Take 1 tablet by mouth every 4 (four) hours as needed for severe pain. 20 tablet 0 Past Week at Unknown time  . vitamin B-12 (CYANOCOBALAMIN) 100 MCG tablet Take 100 mcg by mouth daily.    06/21/2017 at Unknown time  . azithromycin (ZITHROMAX) 250 MG tablet Take 1 tablet (250 mg total) by mouth daily. Take first 2 tablets together, then 1 every day until finished. (Patient not taking: Reported on 05/31/2017) 6 tablet 0 Not Taking at Unknown time  . diphenhydramine-acetaminophen (TYLENOL PM) 25-500 MG TABS Take 2 tablets by mouth at bedtime as needed.   prn at prn  . docusate sodium (COLACE) 100 MG capsule Take 1 capsule (100  mg total) by mouth 2 (two) times daily. (Patient not taking: Reported on 06/21/2017) 60 capsule 0 Not Taking at Unknown time  . gatifloxacin (ZYMAXID) 0.5 % SOLN Place 1 drop into the left eye 4 (four) times daily. (Patient not taking: Reported on 12/07/2015)   Not Taking at Unknown time  . ondansetron (ZOFRAN ODT) 4 MG disintegrating tablet Take 1 tablet (4 mg total) by mouth every 8 (eight) hours as needed for nausea or vomiting. 20 tablet 0 prn at prn  . valACYclovir (VALTREX) 1000 MG tablet Take 1,000 mg by mouth 2 (two) times daily as needed.    prn at prn   Scheduled:  . busPIRone  15 mg Oral BID  . calcitRIOL  0.25 mcg Oral Daily  . calcium citrate  1 tablet Oral Daily  . enalapril  2.5 mg Oral BID  . FLUoxetine  60 mg Oral Daily  . sodium chloride flush  3 mL Intravenous Q12H  . vitamin B-12  100 mcg Oral Daily   Infusions:  . sodium chloride    . heparin     PRN:  Anti-infectives (From admission, onward)   Start     Dose/Rate Route Frequency Ordered Stop   06/22/17 2130  valACYclovir (VALTREX) tablet 1,000 mg     1,000 mg Oral 2 times daily PRN 06/22/17 2121     06/22/17 1300  valACYclovir (VALTREX) tablet 1,000 mg  Status:  Discontinued     1,000 mg Oral 2 times  daily 06/22/17 1225 06/22/17 2121   06/22/17 0943  ceFAZolin (ANCEF) IVPB 2g/100 mL premix  Status:  Discontinued     2 g 200 mL/hr over 30 Minutes Intravenous 30 min pre-op 06/22/17 7425 06/22/17 1544      Assessment: 77 year old female with PE, Pharmacy consulted for pe dosing of heparin gtt per protocol.  3/1 11:00 Heparin level resulted at 0.55 Currently on Heparin at 950 units/hr  Noted that patient lost IV access this morning, spoke with RN and Heparin was off for about 10 minutes while IV access reestablished.  Goal of Therapy:  Heparin level 0.3-0.7 units/ml Monitor platelets by anticoagulation protocol: Yes   Plan:  Will continue with current rate of 950 units/hr and check a confirmatory level in 8 hours. Daily CBC while on Heparin infusion.  03/01 @ 2130: HL= 1. Will pause infusion x 1 hour and resume at 750 units/hr. Will recheck HL 8 hours after resuming infusion.   Ulice Dash, PharmD Clinical Pharmacist  06/22/2017 9:30 PM

## 2017-06-22 NOTE — Progress Notes (Signed)
Pt off the floor for thrombectomy in specials.

## 2017-06-22 NOTE — Progress Notes (Signed)
[place] [facility] [place] NAME: Ann Russell    MR#:  [id number]  DATE OF BIRTH:  11-14-1940  SUBJECTIVE:   Patient presented to the ER with shortness of breath. She reports that she had recent sacral fracture and has been bedbound for 3 weeks although she was working with home health physical therapy.  REVIEW OF SYSTEMS:    Review of Systems  Constitutional: Negative for fever, chills weight loss HENT: Negative for ear pain, nosebleeds, congestion, facial swelling, rhinorrhea, neck pain, neck stiffness and ear discharge.   Respiratory: Negative for cough, positive shortness of breath, no wheezing  Cardiovascular: Negative for chest pain, palpitations and leg swelling.  Gastrointestinal: Negative for heartburn, abdominal pain, vomiting, diarrhea or consitpation Genitourinary: Negative for dysuria, urgency, frequency, hematuria Musculoskeletal: Negative for back pain or joint pain Neurological: Negative for dizziness, seizures, syncope, focal weakness,  numbness and headaches.  Hematological: Does not bruise/bleed easily.  Psychiatric/Behavioral: Negative for hallucinations, confusion, dysphoric mood    Tolerating Diet: yes      DRUG ALLERGIES:  No Known Allergies  VITALS:  Blood pressure 124/72, pulse 95, temperature 97.8 F (36.6 C), temperature source Oral, resp. rate 14, height 5\' 7"  (1.702 m), weight 76.3 kg (168 lb 3.2 oz), SpO2 96 %.  PHYSICAL EXAMINATION:  Constitutional: Appears well-developed and well-nourished. No distress. HENT: Normocephalic. Marland Kitchen Oropharynx is clear and moist.  Eyes: Conjunctivae and EOM are normal. PERRLA, no scleral icterus.  Neck: Normal ROM. Neck supple. No JVD. No tracheal deviation. CVS: RRR, S1/S2 +, no murmurs, no gallops, no carotid bruit.  Pulmonary: Effort and breath sounds normal, no stridor, rhonchi, wheezes, rales.  Abdominal: Soft. BS +,  no distension, tenderness, rebound or guarding.   Musculoskeletal: Normal range of motion. No edema and no tenderness.  Neuro: Alert. CN 2-12 grossly intact. No focal deficits. Skin: Skin is warm and dry. No rash noted. Psychiatric: Normal mood and affect.      LABORATORY PANEL:   CBC Recent Labs  Lab 06/22/17 0159  WBC 8.2  HGB 11.1*  HCT 33.0*  PLT 206   ------------------------------------------------------------------------------------------------------------------  Chemistries  Recent Labs  Lab 06/21/17 1719 06/22/17 0159  NA 139 139  K 4.5 4.1  CL 109 113*  CO2 21* 19*  GLUCOSE 99 100*  BUN 32* 31*  CREATININE 1.41* 1.34*  CALCIUM 9.6 8.8*  AST 30  --   ALT 29  --   ALKPHOS 110  --   BILITOT 0.8  --    ------------------------------------------------------------------------------------------------------------------  Cardiac Enzymes Recent Labs  Lab 06/21/17 1719  TROPONINI 0.03*   ------------------------------------------------------------------------------------------------------------------  RADIOLOGY:  Dg Chest 2 View  Result Date: 06/21/2017 CLINICAL DATA:  Shortness of breath.  Evaluate for pneumonia. EXAM: CHEST  2 VIEW COMPARISON:  None. FINDINGS: The heart size and mediastinal contours are within normal limits. Normal pulmonary vascularity. No focal consolidation, pleural effusion, or pneumothorax. No acute osseous abnormality. IMPRESSION: No active cardiopulmonary disease. Electronically Signed   By: Titus Dubin M.D.   On: 06/21/2017 17:42   Ct Angio Chest Pe W And/or Wo Contrast  Result Date: 06/21/2017 CLINICAL DATA:  Shortness of breath for 3 days with generalized weakness. EXAM: CT ANGIOGRAPHY CHEST WITH CONTRAST TECHNIQUE: Multidetector CT imaging of the chest was performed using the standard protocol during bolus administration of intravenous contrast. Multiplanar CT image reconstructions and MIPs were obtained to evaluate the vascular anatomy. CONTRAST:  27mL ISOVUE-370 IOPAMIDOL  (ISOVUE-370) INJECTION 76% COMPARISON:  None.  FINDINGS: Cardiovascular: Tiny pericardial effusion noted. Heart size normal. No thoracic aortic aneurysm. Large volume pulmonary embolus identified with saddle configuration crossing the pulmonary artery bifurcation. Occlusive and nearly occlusive pulmonary embolus is seen in lobar and segmental arteries to the lower lobes and right middle lobe. Pulmonary embolus also noted in segmental branches to the left upper lobe. RV/LV ratio is 1.3. Mediastinum/Nodes: No mediastinal lymphadenopathy. There is no hilar lymphadenopathy. The esophagus has normal imaging features. There is no axillary lymphadenopathy. Lungs/Pleura: No dense focal airspace consolidation. Subtle peripheral ground-glass attenuation seen anterior upper lobes bilaterally with atelectasis noted in the lingula. No pulmonary edema or pleural effusion. No definite pulmonary infarct. 4 mm right upper lobe pulmonary nodule identified on image 43 of series 6. 3 mm right upper lobe pulmonary nodule seen on image 29. 2 mm left apical nodule visible on image 16 with other scattered tiny pulmonary nodules evident. Upper Abdomen: Surgical changes noted in the stomach. Kidneys incompletely visualized with cortical scarring noted in the left kidney. Musculoskeletal: Bone windows reveal no worrisome lytic or sclerotic osseous lesions. Review of the MIP images confirms the above findings. IMPRESSION: 1. Large saddle pulmonary embolus with evidence of right heart strain. Positive for acute PE with CT evidence of right heart strain (RV/LV Ratio = 1.3) consistent with at least submassive (intermediate risk) PE. The presence of right heart strain has been associated with an increased risk of morbidity and mortality. Please activate Code PE by paging [phone]. 2. Tiny bilateral pulmonary nodules measuring up to 4 mm maximum size. No follow-up needed if patient is low-risk (and has no known or suspected primary neoplasm).  Non-contrast chest CT can be considered in 12 months if patient is high-risk. This recommendation follows the consensus statement: Guidelines for Management of Incidental Pulmonary Nodules Detected on CT Images: From the Fleischner Society 2017; Radiology 2017; 284:228-243. Critical Value/emergent results were called by me at the time of interpretation on 06/21/2017 at 7:10 pm to Dr. Mable Paris, who verbally acknowledged these results. Electronically Signed   By: Misty Stanley M.D.   On: 06/21/2017 19:11   Dg Shoulder Left  Result Date: 06/22/2017 CLINICAL DATA:  Left shoulder pain after fall 3 weeks prior. EXAM: LEFT SHOULDER - 2+ VIEW COMPARISON:  None. FINDINGS: No fracture. No left glenohumeral dislocation. No evidence of left acromioclavicular separation. No suspicious focal osseous lesions. Mild osteoarthritis in the left glenohumeral and left acromioclavicular joints. Tiny clustered 4-5 mm densities overlying the proximal left humerus, cannot exclude loose intra-articular bodies. IMPRESSION: No fracture or dislocation. 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 Tiny clustered 4-5 mm densities overlying the proximal left humerus, cannot exclude loose intra-articular bodies. Electronically Signed   By: Ilona Sorrel M.D.   On: 06/22/2017 12:06     ASSESSMENT AND PLAN:   77 year old female with a history of squamous cell Cancer the skin, chronic kidney disease stage III, sleep apnea and recent sacral fracture who has been predominantly bedbound presents with shortness of breath.  1. Acute hypoxic respiratory failure in the setting of large saddle pulmonary emboli Continue heparin drip with plan for oral intake of the patient tomorrow. Side effects, alternatives, risks and benefits were thoroughly discussed with the patient and not limited to bleeding. Wean oxygen as tolerated Continue ISS  2. Large saddle pulmonary emboli: Check venous Dopplers Vascular surgery, pulmonary and  oncology evaluation pending   3. Left shoulder pain after mechanical fall a few weeks ago: X-ray does not show evidence of fracture  There are loose intra-articular bodies noted Orthopedic surgery consultation requested  4. Essential hypertension: Continue Vasotec  5. Depression: Continue Prozac and BuSpar  6. chronic kidney disease stage III: Creatinine at baseline   Management plans discussed with the patient and she is in agreement.  CODE STATUS: FULL  TOTAL TIME TAKING CARE OF THIS PATIENT: 30 minutes.     POSSIBLE D/C tomorrow, DEPENDING ON CLINICAL CONDITION.   Wayman Hoard M.D on 06/22/2017 at 12:46 PM  Between 7am to 6pm - Pager - [phone] After 6pm go to [email or web address] - password [email or web address] [facility]  Office  [phone]  CC: Primary care physician; Idelle Crouch, MD  Note: This dictation was prepared with Dragon dictation along with smaller phrase technology. Any transcriptional errors that result from this process are unintentional.

## 2017-06-22 NOTE — Progress Notes (Signed)
Pt IV came off at the change of shift report. IV team consult was place. Will continue to monitor.

## 2017-06-22 NOTE — Consult Note (Signed)
ORTHOPAEDIC CONSULTATION  REQUESTING PHYSICIAN: Bettey Costa, MD  Chief Complaint:   Left shoulder pain  History of Present Illness: Ann Russell is a 77 y.o. female with proximally a 1 month history of worsening left shoulder pain.  She notes that she may have had some shoulder pain prior to 1 month ago, but pain increased when she had a fall and sustained a nondisplaced sacral fracture that has been treated nonoperatively.  She notes that her sacral pain is significantly improved.  Shoulder pain is much more bothersome now.  She is admitted to the hospital due to shortness of breath and was found to have pulmonary embolisms.  She is now on anticoagulation for this.  Regarding her left shoulder pain, she notes pain is sharp and stabbing.  It can also be achy with overuse.  She notes she has some difficulty raising her arms overhead.  She also has significant weakness.  Pain is improved at rest.  Pain is worse with attempted overhead activities.  Pain is located over the lateral aspect of the shoulder.  There is no significant radiation down into her fingers.  There is no numbness or tingling in the left upper extremity.  Of note, she has had a prior rotator cuff repair by Dr. Marry Guan on the right side many years ago.  Past Medical History:  Diagnosis Date  . Arthritis    hands  . 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 Chronic kidney disease    .  Takes Vasotec for kidneys.  . Depression   . Hyperlipemia   . Hypertension   . Sleep apnea    Past Surgical History:  Procedure Laterality Date  . ABDOMINAL HYSTERECTOMY    . Buniectomy     bil  . CHOLECYSTECTOMY    . FACIAL COSMETIC SURGERY  2011  . GASTRIC BYPASS  2010  . Rotor cuff     right  . SCLERAL BUCKLE  11/07/2011   Procedure: SCLERAL BUCKLE;  Surgeon: Hayden Pedro, MD;  Location: [facility];  Service: Ophthalmology;  Laterality: Left;   Social  History   Socioeconomic History  . Marital status: Single    Spouse name: None  . Number of children: None  . Years of education: None  . Highest education level: None  Social Needs  . Financial resource strain: None  . Food insecurity - worry: None  . Food insecurity - inability: None  . Transportation needs - medical: None  . Transportation needs - non-medical: None  Occupational History  . Occupation: retired  Tobacco Use  . Smoking status: Never Smoker  . Smokeless tobacco: Never Used  Substance and Sexual Activity  . Alcohol use: No  . Drug use: No  . Sexual activity: No  Other Topics Concern  . None  Social History Narrative  . None   Family History  Problem Relation Age of Onset  . Heart disease Mother   . Neuropathy Father   . Breast cancer Neg Hx    No Known Allergies Prior to Admission medications   Medication Sig Start Date End Date Taking? Authorizing Provider  busPIRone (BUSPAR) 15 MG tablet Take 1 tablet by mouth 2 (two) times daily. 06/06/17  Yes [provider]  calcitRIOL (ROCALTROL) 0.25 MCG capsule Take 1 capsule by mouth daily. 06/16/17  Yes [provider]  calcium citrate-vitamin D (CITRACAL+D) 315-200 MG-UNIT tablet Take 1 tablet by mouth daily.    Yes [provider]  enalapril (VASOTEC) 2.5 MG tablet Take 2.5 mg by mouth 2 (two) times daily.   Yes [provider]  FLUoxetine (PROZAC) 20 MG capsule Take 60 mg by mouth daily. 05/20/17  Yes [provider]  oxyCODONE-acetaminophen (PERCOCET) 5-325 MG tablet Take 1 tablet by mouth every 4 (four) hours as needed for severe pain. 05/31/17  Yes Harvest Dark, MD  vitamin B-12 (CYANOCOBALAMIN) 100 MCG tablet Take 100 mcg by mouth daily.    Yes [provider]  azithromycin (ZITHROMAX) 250 MG tablet Take 1 tablet (250 mg total) by mouth daily. Take first 2 tablets together, then 1 every day until finished. Patient not taking: Reported on 05/31/2017  01/23/16   Marney Setting, NP  diphenhydramine-acetaminophen (TYLENOL PM) 25-500 MG TABS Take 2 tablets by mouth at bedtime as needed.    [provider]  docusate sodium (COLACE) 100 MG capsule Take 1 capsule (100 mg total) by mouth 2 (two) times daily. Patient not taking: Reported on 06/21/2017 05/31/17 06/30/17  Harvest Dark, MD  gatifloxacin (ZYMAXID) 0.5 % SOLN Place 1 drop into the left eye 4 (four) times daily. Patient not taking: Reported on 12/07/2015 11/08/11   Hayden Pedro, MD  ondansetron (ZOFRAN ODT) 4 MG disintegrating tablet Take 1 tablet (4 mg total) by mouth every 8 (eight) hours as needed for nausea or vomiting. 05/31/17   Harvest Dark, MD  valACYclovir (VALTREX) 1000 MG tablet Take 1,000 mg by mouth 2 (two) times daily.    [provider]   Recent Labs    06/21/17 1719 06/21/17 2219 06/22/17 0159  WBC 5.6  --  8.2  HGB 12.6  --  11.1*  HCT 38.4  --  33.0*  PLT 228  --  206  K 4.5  --  4.1  CL 109  --  113*  CO2 21*  --  19*  BUN 32*  --  31*  CREATININE 1.41*  --  1.34*  GLUCOSE 99  --  100*  CALCIUM 9.6  --  8.8*  INR  --  1.19  --    Dg Chest 2 View  Result Date: 06/21/2017 CLINICAL DATA:  Shortness of breath.  Evaluate for pneumonia. EXAM: CHEST  2 VIEW COMPARISON:  None. FINDINGS: The heart size and mediastinal contours are within normal limits. Normal pulmonary vascularity. No focal consolidation, pleural effusion, or pneumothorax. No acute osseous abnormality. IMPRESSION: No active cardiopulmonary disease. Electronically Signed   By: Titus Dubin M.D.   On: 06/21/2017 17:42   Ct Angio Chest Pe W And/or Wo Contrast  Result Date: 06/21/2017 CLINICAL DATA:  Shortness of breath for 3 days with generalized weakness. EXAM: CT ANGIOGRAPHY CHEST WITH CONTRAST TECHNIQUE: Multidetector CT imaging of the chest was performed using the standard protocol during bolus administration of intravenous contrast. Multiplanar CT image  reconstructions and MIPs were obtained to evaluate the vascular anatomy. CONTRAST:  58mL ISOVUE-370 IOPAMIDOL (ISOVUE-370) INJECTION 76% COMPARISON:  None. FINDINGS: Cardiovascular: Tiny pericardial effusion noted. Heart size normal. No thoracic aortic aneurysm. Large volume pulmonary embolus identified with saddle configuration crossing the pulmonary artery bifurcation. Occlusive and nearly occlusive pulmonary embolus is seen in lobar and segmental arteries to the lower lobes and right middle lobe. Pulmonary embolus also noted in segmental branches to the left upper lobe. RV/LV ratio is 1.3. Mediastinum/Nodes: No mediastinal lymphadenopathy. There is no hilar lymphadenopathy. The esophagus has normal imaging features. There is no axillary lymphadenopathy. Lungs/Pleura: No dense focal airspace consolidation. Subtle peripheral  ground-glass attenuation seen anterior upper lobes bilaterally with atelectasis noted in the lingula. No pulmonary edema or pleural effusion. No definite pulmonary infarct. 4 mm right upper lobe pulmonary nodule identified on image 43 of series 6. 3 mm right upper lobe pulmonary nodule seen on image 29. 2 mm left apical nodule visible on image 16 with other scattered tiny pulmonary nodules evident. Upper Abdomen: Surgical changes noted in the stomach. Kidneys incompletely visualized with cortical scarring noted in the left kidney. Musculoskeletal: Bone windows reveal no worrisome lytic or sclerotic osseous lesions. Review of the MIP images confirms the above findings. IMPRESSION: 1. Large saddle pulmonary embolus with evidence of right heart strain. Positive for acute PE with CT evidence of right heart strain (RV/LV Ratio = 1.3) consistent with at least submassive (intermediate risk) PE. The presence of right heart strain has been associated with an increased risk of morbidity and mortality. Please activate Code PE by paging [phone]. 2. Tiny bilateral pulmonary nodules measuring up to 4 mm  maximum size. No follow-up needed if patient is low-risk (and has no known or suspected primary neoplasm). Non-contrast chest CT can be considered in 12 months if patient is high-risk. This recommendation follows the consensus statement: Guidelines for Management of Incidental Pulmonary Nodules Detected on CT Images: From the Fleischner Society 2017; Radiology 2017; 284:228-243. Critical Value/emergent results were called by me at the time of interpretation on 06/21/2017 at 7:10 pm to Dr. Mable Paris, who verbally acknowledged these results. Electronically Signed   By: Misty Stanley M.D.   On: 06/21/2017 19:11   US Venous Img Lower Bilateral  Result Date: 06/22/2017 CLINICAL DATA:  History of pulmonary embolism.  Evaluate for DVT. EXAM: BILATERAL LOWER EXTREMITY VENOUS DOPPLER ULTRASOUND TECHNIQUE: Gray-scale sonography with graded compression, as well as color Doppler and duplex ultrasound were performed to evaluate the lower extremity deep venous systems from the level of the common femoral vein and including the common femoral, femoral, profunda femoral, popliteal and calf veins including the posterior tibial, peroneal and gastrocnemius veins when visible. The superficial great saphenous vein was also interrogated. Spectral Doppler was utilized to evaluate flow at rest and with distal augmentation maneuvers in the common femoral, femoral and popliteal veins. COMPARISON:  None. FINDINGS: RIGHT LOWER EXTREMITY Common Femoral Vein: Note, evaluation of the right common femoral vein is degraded secondary overlying bandages. No evidence of thrombus. Normal compressibility, respiratory phasicity and response to augmentation. Saphenofemoral Junction: No evidence of thrombus. Normal compressibility and flow on color Doppler imaging. Profunda Femoral Vein: No evidence of thrombus. Normal compressibility and flow on color Doppler imaging. Femoral Vein: No evidence of thrombus. Normal compressibility, respiratory phasicity  and response to augmentation. Popliteal Vein: There is mixed echogenic near occlusive DVT seen throughout the right popliteal vein (images 19 - 25). Calf Veins: No evidence of thrombus. Normal compressibility and flow on color Doppler imaging. Superficial Great Saphenous Vein: No evidence of thrombus. Normal compressibility. Venous Reflux:  None. Other Findings:  None. LEFT LOWER EXTREMITY Common Femoral Vein: No evidence of thrombus. Normal compressibility, respiratory phasicity and response to augmentation. Saphenofemoral Junction: No evidence of thrombus. Normal compressibility and flow on color Doppler imaging. Profunda Femoral Vein: No evidence of thrombus. Normal compressibility and flow on color Doppler imaging. Femoral Vein: No evidence of thrombus. Normal compressibility, respiratory phasicity and response to augmentation. Popliteal Vein: No evidence of thrombus. Normal compressibility, respiratory phasicity and response to augmentation. Calf Veins: No evidence of thrombus. Normal compressibility and flow on color Doppler imaging. Superficial  Great Saphenous Vein: No evidence of thrombus. Normal compressibility. Venous Reflux:  None. Other Findings:  None. IMPRESSION: 1. Near occlusive DVT within the right popliteal vein. 2. No evidence of DVT within the left lower extremity. Electronically Signed   By: Sandi Mariscal M.D.   On: 06/22/2017 17:06   Dg Shoulder Left  Result Date: 06/22/2017 CLINICAL DATA:  Left shoulder pain after fall 3 weeks prior. EXAM: LEFT SHOULDER - 2+ VIEW COMPARISON:  None. FINDINGS: No fracture. No left glenohumeral dislocation. No evidence of left acromioclavicular separation. No suspicious focal osseous lesions. Mild osteoarthritis in the left glenohumeral and left acromioclavicular joints. Tiny clustered 4-5 mm densities overlying the proximal left humerus, cannot exclude loose intra-articular bodies. IMPRESSION: No fracture or dislocation. 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 Tiny clustered 4-5 mm densities overlying the proximal left humerus, cannot exclude loose intra-articular bodies. Electronically Signed   By: Ilona Sorrel M.D.   On: 06/22/2017 12:06     Positive ROS: All other systems have been reviewed and were otherwise negative with the exception of those mentioned in the HPI and as above.  Physical Exam: BP 128/72 (BP Location: Left Arm)   Pulse 84   Temp 98.4 F (36.9 C) (Oral)   Resp 18   Ht 5\' 7"  (1.702 m)   Wt 76.3 kg (168 lb 3.2 oz)   SpO2 97%   BMI 26.34 kg/m  General:  Alert, no acute distress Psychiatric:  Patient is competent for consent with normal mood and affect   Cardiovascular:  No pedal edema, regular rate and rhythm Respiratory:  No wheezing, non-labored breathing GI:  Abdomen is soft and non-tender Skin:  No lesions in the area of chief complaint, no erythema Neurologic:  Sensation intact distally, CN grossly intact Lymphatic:  No axillary or cervical lymphadenopathy  Orthopedic Exam:  RUE: +ain/pin/u motor SILT r/u/m/ax +rad pulse RoM 140 flexion, 60 external rotation, hip pocket internal rotation. 4/5 strength to testing of the supraspinatus and external rotation strength.   X-rays left shoulder:  As above: Possible loose bodies, but these do not appear to be in an intra-articular position.  There are also degenerative changes of the left glenohumeral joint as well as the acromioclavicular joint.  There are no fractures or dislocations noted.  Assessment/Plan: 1.  We discussed that her pain was likely due to rotator cuff pathology.  She also has some underlying glenohumeral osteoarthritis.  The cortical density seen on radiographs are less likely to be the source of her pain, given her exam.  We discussed the various treatment options including both surgical and nonsurgical for this.  We also establish that it would be better to fully evaluate and workup her left shoulder as an outpatient.  2.   She will follow-up with me as an outpatient after she is discharged from the hospital.  We will likely obtain an MRI of her left shoulder to evaluate her rotator cuff.  Until then, there are no restrictions on her left shoulder use.    Leim Fabry   06/22/2017 5:52 PM

## 2017-06-22 NOTE — Plan of Care (Signed)
  Progressing Clinical Measurements: Respiratory complications will improve 06/22/2017 0146 - Progressing by Liliane Channel, RN Pain Managment: General experience of comfort will improve 06/22/2017 0146 - Progressing by Liliane Channel, RN Safety: Ability to remain free from injury will improve 06/22/2017 0146 - Progressing by Liliane Channel, RN

## 2017-06-22 NOTE — Progress Notes (Signed)
Pt back from specials/US. VSS stable, no bleeding, no hematoma, no pain. Will continue to monitor.

## 2017-06-22 NOTE — Consult Note (Signed)
[facility]      Assessment and Plan:  Acute pulmonary embolism with evidence of RV overload. Frequent falls with elevated bleeding risk while on anticoagulation with near-syncopal episodes.   --Recommend transition to shorter acting anticoagulant such as eliquis, favor 3 month course given fall risk.  --Recommend neuro/cardiac eval for possible syncopal episodes, PE may be contributory to previous syncopal episodes, however she did not have dyspnea at those times, making it less likely.  --Pt was given my card and asked to follow up outpatient in 2-4 weeks post discharge.    Date: 06/22/2017  MRN# [id number] [facility] September 28, 1940  Referring Physician: Dr. Benjie Karvonen.   Ann [facility] is a 77 y.o. old female seen in [facility] for chief complaint of:    Chief Complaint  Patient presents with  . Shortness of Breath  . Generalized Body Aches    HPI:   The patient is a 77 year old female, she had a fall in early February of this year, she underwent a CT which showed a nondisplaced sacral fracture.  Patient declined any sort of admission or placement in rehab facility opting to go home with home physical therapy.  She was predominantly homebound for the last few weeks, she ambulated with the use of a walker.  Subsequently she presented after falling at home, she notes that she thinks she lost consciousness.  She been having progressive shortness of breath over the past several days.  Upon presentation BNP was 1235, troponin was 0.03.  She underwent a CT chest, images were personally reviewed, shows a significant saddle pulmonary embolism affecting both pulmonary arteries.  Right ventricle is dilated, larger than left ventricle consistent with RV pressure overload.  Patient subsequently went for catheter directed thrombectomy/thrombolysis today, patient notes that her breathing is significantly better with less pressure on her chest, though she does not yet feel  back to baseline.  She has no other particular complaints at this time Patient has no history of personal blood clots nor family history of blood clots.  She does state that she has several falls in the last few months, and some of those occasions she notes the dizziness.  She tells me that sometimes she feels a dizziness coming on while she is walking then she will sit down until she feels better.  I asked her whether she would consider going to inpatient rehab for further PT, but she is adamantly against this.   PMHX:   Past Medical History:  Diagnosis Date  . Arthritis    hands  . 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 Chronic kidney disease    .  Takes Vasotec for kidneys.  . Depression   . Hyperlipemia   . Hypertension   . Sleep apnea    Surgical Hx:  Past Surgical History:  Procedure Laterality Date  . ABDOMINAL HYSTERECTOMY    . Buniectomy     bil  . CHOLECYSTECTOMY    . FACIAL COSMETIC SURGERY  2011  . GASTRIC BYPASS  2010  . Rotor cuff     right  . SCLERAL BUCKLE  11/07/2011   Procedure: SCLERAL BUCKLE;  Surgeon: Hayden Pedro, MD;  Location: [facility];  Service: Ophthalmology;  Laterality: Left;   Family Hx:  Family History  Problem Relation Age of Onset  . Heart disease Mother   . Neuropathy Father   . Breast cancer Neg Hx    Social Hx:  Social History   Tobacco Use  . Smoking status: Never Smoker  . Smokeless tobacco: Never Used  Substance Use Topics  . Alcohol use: No  . Drug use: No   Medication:    Current Facility-Administered Medications:  .  0.9 %  sodium chloride infusion, 250 mL, Intravenous, PRN, Pyreddy, Pavan, MD .  acetaminophen (TYLENOL) tablet 650 mg, 650 mg, Oral, Q6H PRN **OR** acetaminophen (TYLENOL) suppository 650 mg, 650 mg, Rectal, Q6H PRN, Pyreddy, Pavan, MD .  busPIRone (BUSPAR) tablet 15 mg, 15 mg, Oral, BID, Pyreddy, Pavan, MD, 15 mg at 06/22/17 0914 .  calcitRIOL (ROCALTROL) capsule 0.25 mcg, 0.25 mcg, Oral,  Daily, Pyreddy, Pavan, MD, 0.25 mcg at 06/22/17 0913 .  calcium citrate (CALCITRATE - dosed in mg elemental calcium) tablet 200 mg of elemental calcium, 1 tablet, Oral, Daily, Pyreddy, Pavan, MD, 200 mg of elemental calcium at 06/22/17 0914 .  enalapril (VASOTEC) tablet 2.5 mg, 2.5 mg, Oral, BID, Pyreddy, Pavan, MD, 2.5 mg at 06/22/17 0913 .  FLUoxetine (PROZAC) capsule 60 mg, 60 mg, Oral, Daily, Pyreddy, Pavan, MD, 60 mg at 06/22/17 0914 .  heparin ADULT infusion 100 units/mL (25000 units/250mL sodium chloride 0.45%), 950 Units/hr, Intravenous, Continuous, Pyreddy, Pavan, MD, Last Rate: 9.5 mL/hr at 06/22/17 1457, 950 Units/hr at 06/22/17 1457 .  morphine 2 MG/ML injection 2 mg, 2 mg, Intravenous, Q4H PRN, Pyreddy, Pavan, MD, 2 mg at 06/21/17 1946 .  ondansetron (ZOFRAN) tablet 4 mg, 4 mg, Oral, Q6H PRN **OR** ondansetron (ZOFRAN) injection 4 mg, 4 mg, Intravenous, Q6H PRN, Pyreddy, Pavan, MD .  oxyCODONE-acetaminophen (PERCOCET/ROXICET) 5-325 MG per tablet 1 tablet, 1 tablet, Oral, Q4H PRN, Pyreddy, Pavan, MD .  senna-docusate (Senokot-S) tablet 1 tablet, 1 tablet, Oral, QHS PRN, Pyreddy, Pavan, MD .  sodium chloride flush (NS) 0.9 % injection 3 mL, 3 mL, Intravenous, Q12H, Pyreddy, Pavan, MD, 3 mL at 06/22/17 0915 .  sodium chloride flush (NS) 0.9 % injection 3 mL, 3 mL, Intravenous, PRN, Pyreddy, Pavan, MD .  valACYclovir (VALTREX) tablet 1,000 mg, 1,000 mg, Oral, BID, Mody, Sital, MD, 1,000 mg at 06/22/17 1743 .  vitamin B-12 (CYANOCOBALAMIN) tablet 100 mcg, 100 mcg, Oral, Daily, Pyreddy, Pavan, MD, 100 mcg at 06/22/17 0913   Allergies:  Patient has no known allergies.  Review of Systems: Gen:  Denies  fever, sweats, chills HEENT: Denies blurred vision, double vision. bleeds, sore throat Cvc:  No dizziness, chest pain. Resp:   Denies cough or sputum production, shortness of breath Gi: Denies swallowing difficulty, stomach pain. Gu:  Denies bladder incontinence, burning urine Ext:   No  Joint pain, stiffness. Skin: No skin rash,  hives  Endoc:  No polyuria, polydipsia. Psych: No depression, insomnia. Other:  All other systems were reviewed with the patient and were negative other that what is mentioned in the HPI.   Physical Examination:   VS: BP 128/72 (BP Location: Left Arm)   Pulse 84   Temp 98.4 F (36.9 C) (Oral)   Resp 18   Ht 5\' 7"  (1.702 m)   Wt 168 lb 3.2 oz (76.3 kg)   SpO2 97%   BMI 26.34 kg/m   General Appearance: No distress  Neuro:without focal findings,  speech normal,  HEENT: PERRLA, EOM intact.   Pulmonary: normal breath sounds, No wheezing.  CardiovascularNormal S1,S2.  No m/r/g.   Abdomen: Benign, Soft, non-tender. Renal:  No costovertebral tenderness  GU:  No performed at this time. Endoc: No evident thyromegaly, no signs of acromegaly.  Skin:   warm, no rashes, no ecchymosis  Extremities: normal, no cyanosis, clubbing.  Other findings:    LABORATORY PANEL:   CBC Recent Labs  Lab 06/22/17 0159  WBC 8.2  HGB 11.1*  HCT 33.0*  PLT 206   ------------------------------------------------------------------------------------------------------------------  Chemistries  Recent Labs  Lab 06/21/17 1719 06/22/17 0159  NA 139 139  K 4.5 4.1  CL 109 113*  CO2 21* 19*  GLUCOSE 99 100*  BUN 32* 31*  CREATININE 1.41* 1.34*  CALCIUM 9.6 8.8*  AST 30  --   ALT 29  --   ALKPHOS 110  --   BILITOT 0.8  --    ------------------------------------------------------------------------------------------------------------------  Cardiac Enzymes Recent Labs  Lab 06/21/17 1719  TROPONINI 0.03*   ------------------------------------------------------------  RADIOLOGY:  Dg Chest 2 View  Result Date: 06/21/2017 CLINICAL DATA:  Shortness of breath.  Evaluate for pneumonia. EXAM: CHEST  2 VIEW COMPARISON:  None. FINDINGS: The heart size and mediastinal contours are within normal limits. Normal pulmonary vascularity. No focal consolidation,  pleural effusion, or pneumothorax. No acute osseous abnormality. IMPRESSION: No active cardiopulmonary disease. Electronically Signed   By: Titus Dubin M.D.   On: 06/21/2017 17:42   Ct Angio Chest Pe W And/or Wo Contrast  Result Date: 06/21/2017 CLINICAL DATA:  Shortness of breath for 3 days with generalized weakness. EXAM: CT ANGIOGRAPHY CHEST WITH CONTRAST TECHNIQUE: Multidetector CT imaging of the chest was performed using the standard protocol during bolus administration of intravenous contrast. Multiplanar CT image reconstructions and MIPs were obtained to evaluate the vascular anatomy. CONTRAST:  97mL ISOVUE-370 IOPAMIDOL (ISOVUE-370) INJECTION 76% COMPARISON:  None. FINDINGS: Cardiovascular: Tiny pericardial effusion noted. Heart size normal. No thoracic aortic aneurysm. Large volume pulmonary embolus identified with saddle configuration crossing the pulmonary artery bifurcation. Occlusive and nearly occlusive pulmonary embolus is seen in lobar and segmental arteries to the lower lobes and right middle lobe. Pulmonary embolus also noted in segmental branches to the left upper lobe. RV/LV ratio is 1.3. Mediastinum/Nodes: No mediastinal lymphadenopathy. There is no hilar lymphadenopathy. The esophagus has normal imaging features. There is no axillary lymphadenopathy. Lungs/Pleura: No dense focal airspace consolidation. Subtle peripheral ground-glass attenuation seen anterior upper lobes bilaterally with atelectasis noted in the lingula. No pulmonary edema or pleural effusion. No definite pulmonary infarct. 4 mm right upper lobe pulmonary nodule identified on image 43 of series 6. 3 mm right upper lobe pulmonary nodule seen on image 29. 2 mm left apical nodule visible on image 16 with other scattered tiny pulmonary nodules evident. Upper Abdomen: Surgical changes noted in the stomach. Kidneys incompletely visualized with cortical scarring noted in the left kidney. Musculoskeletal: Bone windows reveal no  worrisome lytic or sclerotic osseous lesions. Review of the MIP images confirms the above findings. IMPRESSION: 1. Large saddle pulmonary embolus with evidence of right heart strain. Positive for acute PE with CT evidence of right heart strain (RV/LV Ratio = 1.3) consistent with at least submassive (intermediate risk) PE. The presence of right heart strain has been associated with an increased risk of morbidity and mortality. Please activate Code PE by paging [phone]. 2. Tiny bilateral pulmonary nodules measuring up to 4 mm maximum size. No follow-up needed if patient is low-risk (and has no known or suspected primary neoplasm). Non-contrast chest CT can be considered in 12 months if patient is high-risk. This recommendation follows the consensus statement: Guidelines for Management of Incidental Pulmonary Nodules Detected on CT Images: From the Fleischner Society 2017; Radiology 2017; 284:228-243. Critical  Value/emergent results were called by me at the time of interpretation on 06/21/2017 at 7:10 pm to Dr. Mable Paris, who verbally acknowledged these results. Electronically Signed   By: Misty Stanley M.D.   On: 06/21/2017 19:11   US Venous Img Lower Bilateral  Result Date: 06/22/2017 CLINICAL DATA:  History of pulmonary embolism.  Evaluate for DVT. EXAM: BILATERAL LOWER EXTREMITY VENOUS DOPPLER ULTRASOUND TECHNIQUE: Gray-scale sonography with graded compression, as well as color Doppler and duplex ultrasound were performed to evaluate the lower extremity deep venous systems from the level of the common femoral vein and including the common femoral, femoral, profunda femoral, popliteal and calf veins including the posterior tibial, peroneal and gastrocnemius veins when visible. The superficial great saphenous vein was also interrogated. Spectral Doppler was utilized to evaluate flow at rest and with distal augmentation maneuvers in the common femoral, femoral and popliteal veins. COMPARISON:  None. FINDINGS:  RIGHT LOWER EXTREMITY Common Femoral Vein: Note, evaluation of the right common femoral vein is degraded secondary overlying bandages. No evidence of thrombus. Normal compressibility, respiratory phasicity and response to augmentation. Saphenofemoral Junction: No evidence of thrombus. Normal compressibility and flow on color Doppler imaging. Profunda Femoral Vein: No evidence of thrombus. Normal compressibility and flow on color Doppler imaging. Femoral Vein: No evidence of thrombus. Normal compressibility, respiratory phasicity and response to augmentation. Popliteal Vein: There is mixed echogenic near occlusive DVT seen throughout the right popliteal vein (images 19 - 25). Calf Veins: No evidence of thrombus. Normal compressibility and flow on color Doppler imaging. Superficial Great Saphenous Vein: No evidence of thrombus. Normal compressibility. Venous Reflux:  None. Other Findings:  None. LEFT LOWER EXTREMITY Common Femoral Vein: No evidence of thrombus. Normal compressibility, respiratory phasicity and response to augmentation. Saphenofemoral Junction: No evidence of thrombus. Normal compressibility and flow on color Doppler imaging. Profunda Femoral Vein: No evidence of thrombus. Normal compressibility and flow on color Doppler imaging. Femoral Vein: No evidence of thrombus. Normal compressibility, respiratory phasicity and response to augmentation. Popliteal Vein: No evidence of thrombus. Normal compressibility, respiratory phasicity and response to augmentation. Calf Veins: No evidence of thrombus. Normal compressibility and flow on color Doppler imaging. Superficial Great Saphenous Vein: No evidence of thrombus. Normal compressibility. Venous Reflux:  None. Other Findings:  None. IMPRESSION: 1. Near occlusive DVT within the right popliteal vein. 2. No evidence of DVT within the left lower extremity. Electronically Signed   By: Sandi Mariscal M.D.   On: 06/22/2017 17:06   Dg Shoulder Left  Result Date:  06/22/2017 CLINICAL DATA:  Left shoulder pain after fall 3 weeks prior. EXAM: LEFT SHOULDER - 2+ VIEW COMPARISON:  None. FINDINGS: No fracture. No left glenohumeral dislocation. No evidence of left acromioclavicular separation. No suspicious focal osseous lesions. Mild osteoarthritis in the left glenohumeral and left acromioclavicular joints. Tiny clustered 4-5 mm densities overlying the proximal left humerus, cannot exclude loose intra-articular bodies. IMPRESSION: No fracture or dislocation. 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 Tiny clustered 4-5 mm densities overlying the proximal left humerus, cannot exclude loose intra-articular bodies. Electronically Signed   By: Ilona Sorrel M.D.   On: 06/22/2017 12:06       Thank  you for the [facility] and for allowing [facility] to assist in the care of your patient. Our recommendations are noted above.  Please contact us if we can be of further service.   Marda Stalker, MD.  Board Certified in Internal Medicine, Pulmonary Medicine, [place], and Sleep  Medicine.  [facility] Office Number: [phone]  Patricia Pesa, M.D.  Merton Border, M.D  06/22/2017

## 2017-06-22 NOTE — Progress Notes (Addendum)
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 I went to see the pt and assessed the site. It has small amt of blood on it ( oozing from the site) and it started to take the seal off the PAD. I did put pressure on it and change the PAD and keep 40CC on it. Notify Doctor Duane Boston and mentioned to her that pt started to bleed on the site and that Heparin drip is about to start in 1 minute at a rate of 7.5. No new order was placed, but she did tell me to call pharmacy and asked if they can lower the concentration for the heparin. I called the pharmacist Shanon Brow) and told him about the situation. He also talked to Doctor Duane [facility] and states that the plan is to keep pt on heparin 7.5 tonight and transition pt on eliquis tomorrow morning. Will continue to monitor.

## 2017-06-22 NOTE — Progress Notes (Signed)
Family Meeting Note  Advance Directive:yes  Today a meeting took place with the Patient.   The following clinical team members were present during this meeting:MD and RN  The following were discussed:Patient's diagnosis: Large saddle emboli after sacral fracture and being bedbound with acute hypoxic respiratory failure  , Patient's progosis: Unable to determine and Goals for treatment: Full Code  Additional follow-up to be provided: Continue full CODE STATUS. Advanced directives are up-to-date Continue with pulmonary, vascular and oncology evaluation Time spent during discussion: 16 minutes  Majestic Molony, MD

## 2017-06-22 NOTE — Care Management (Signed)
Presents from home with shortness of breath.  Diagnosed with saddle pulmonary embolism and on heparin drip.  Current need for supplemental 02 is acute.  Patient has had decrease in her functional status over the past 3 weeks due to a fracture sacrum.  PT consult is pending

## 2017-06-22 NOTE — Consult Note (Signed)
Hematology/Oncology Consult note [facility] Telephone:([phone] [phone]  Patient Care Team: Idelle Crouch, MD as PCP - General (Internal Medicine)   Name of the patient: Ann Russell  [id number]  10/09/1940   Date of visit: 06/22/17 REASON FOR COSULTATION:  Saddle PE History of presenting illness-  This is a pleasant 77 year old female with past medical history listed as below presented to emergency room due to generalized weakness for the past 2 weeks and shortness of breath for 3 days.  Patient had a fall at home 3 weeks ago and has been bedridden. CT angiogram of chest is positive for saddle pulmonary embolism. [place] LE showed Near occlusive DVT within the right popliteal vein. Patient was started on IV heparin drip for anticoagulation.  Denies any history of hematemesis, hemoptysis. Planned thrombectomy by vascular surgeon this afternoon.    Review of systems- Review of Systems  Constitutional: Positive for malaise/fatigue. Negative for chills and fever.  HENT: Negative for nosebleeds.   Eyes: Negative for pain.  Respiratory: Positive for shortness of breath. Negative for cough and hemoptysis.   Cardiovascular: Negative for chest pain and orthopnea.  Gastrointestinal: Negative for abdominal pain, nausea and vomiting.  Genitourinary: Negative for dysuria.  Musculoskeletal: Negative for myalgias.  Skin: Negative for rash.  Neurological: Positive for weakness. Negative for dizziness.  Endo/Heme/Allergies: Does not bruise/bleed easily.  Psychiatric/Behavioral: The patient is not nervous/anxious.     No Known Allergies  Patient Active Problem List   Diagnosis Date Noted  . Pulmonary embolism ([place]) 06/21/2017  . 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 Arthritis    hands  . 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 Chronic kidney disease    .  Takes Vasotec for  kidneys.  . Depression   . Hyperlipemia   . Hypertension   . Sleep apnea      Past Surgical History:  Procedure Laterality Date  . ABDOMINAL HYSTERECTOMY    . Buniectomy     bil  . CHOLECYSTECTOMY    . FACIAL COSMETIC SURGERY  2011  . GASTRIC BYPASS  2010  . Rotor cuff     right  . SCLERAL BUCKLE  11/07/2011   Procedure: SCLERAL BUCKLE;  Surgeon: Hayden Pedro, MD;  Location: [place];  Service: Ophthalmology;  Laterality: Left;    Social History   Socioeconomic History  . Marital status: Single    Spouse name: Not on file  . Number of children: Not on file  . Years of education: Not on file  . Highest education level: Not on file  Social Needs  . Financial resource strain: Not on file  . Food insecurity - worry: Not on file  . Food insecurity - inability: Not on file  . Transportation needs - medical: Not on file  . Transportation needs - non-medical: Not on file  Occupational History  . Occupation: retired  Tobacco Use  . Smoking status: Never Smoker  . Smokeless tobacco: Never Used  Substance and Sexual Activity  . Alcohol use: No  . Drug use: No  . Sexual activity: No  Other Topics Concern  . Not on file  Social History Narrative  . Not on file     Family History  Problem Relation Age of Onset  . Heart disease Mother   . Neuropathy Father   . Breast cancer Neg Hx  Current Facility-Administered Medications:  .  0.9 %  sodium chloride infusion, 250 mL, Intravenous, PRN, Pyreddy, Pavan, MD .  0.9 %  sodium chloride infusion, , Intravenous, Continuous, Dew, Erskine Squibb, MD, Last Rate: 20 mL/hr at 06/22/17 1325 .  acetaminophen (TYLENOL) tablet 650 mg, 650 mg, Oral, Q6H PRN **OR** acetaminophen (TYLENOL) suppository 650 mg, 650 mg, Rectal, Q6H PRN, Pyreddy, Pavan, MD .  busPIRone (BUSPAR) tablet 15 mg, 15 mg, Oral, BID, Pyreddy, Pavan, MD, 15 mg at 06/22/17 0914 .  calcitRIOL (ROCALTROL) capsule 0.25 mcg, 0.25 mcg, Oral, Daily, Pyreddy, Pavan, MD, 0.25 mcg  at 06/22/17 0913 .  calcium citrate (CALCITRATE - dosed in mg elemental calcium) tablet 200 mg of elemental calcium, 1 tablet, Oral, Daily, Pyreddy, Pavan, MD, 200 mg of elemental calcium at 06/22/17 0914 .  ceFAZolin (ANCEF) IVPB 2g/100 mL premix, 2 g, Intravenous, 30 min Pre-Op, Dew, Erskine Squibb, MD .  enalapril (VASOTEC) tablet 2.5 mg, 2.5 mg, Oral, BID, Pyreddy, Pavan, MD, 2.5 mg at 06/22/17 0913 .  FLUoxetine (PROZAC) capsule 60 mg, 60 mg, Oral, Daily, Pyreddy, Pavan, MD, 60 mg at 06/22/17 0914 .  heparin ADULT infusion 100 units/mL (25000 units/267mL sodium chloride 0.45%), 950 Units/hr, Intravenous, Continuous, Pyreddy, Pavan, MD, Last Rate: 9.5 mL/hr at 06/22/17 0307, 950 Units/hr at 06/22/17 0307 .  morphine 2 MG/ML injection 2 mg, 2 mg, Intravenous, Q4H PRN, Pyreddy, Pavan, MD, 2 mg at 06/21/17 1946 .  ondansetron (ZOFRAN) tablet 4 mg, 4 mg, Oral, Q6H PRN **OR** ondansetron (ZOFRAN) injection 4 mg, 4 mg, Intravenous, Q6H PRN, Pyreddy, Pavan, MD .  oxyCODONE-acetaminophen (PERCOCET/ROXICET) 5-325 MG per tablet 1 tablet, 1 tablet, Oral, Q4H PRN, Pyreddy, Pavan, MD .  senna-docusate (Senokot-S) tablet 1 tablet, 1 tablet, Oral, QHS PRN, Pyreddy, Pavan, MD .  sodium chloride flush (NS) 0.9 % injection 3 mL, 3 mL, Intravenous, Q12H, Pyreddy, Pavan, MD, 3 mL at 06/22/17 0915 .  sodium chloride flush (NS) 0.9 % injection 3 mL, 3 mL, Intravenous, PRN, Pyreddy, Pavan, MD .  valACYclovir (VALTREX) tablet 1,000 mg, 1,000 mg, Oral, BID, Mody, Sital, MD .  vitamin B-12 (CYANOCOBALAMIN) tablet 100 mcg, 100 mcg, Oral, Daily, Pyreddy, Pavan, MD, 100 mcg at 06/22/17 0913   Physical exam:  Vitals:   06/21/17 2316 06/22/17 0459 06/22/17 0743 06/22/17 0930  BP: 100/61 (!) 137/94 (!) 174/88 124/72  Pulse: 88 84 80 95  Resp:  18 18 14   Temp:  97.8 F (36.6 C)    TempSrc:  Oral    SpO2: 94% 93% 97% 96%  Weight:  168 lb 3.2 oz (76.3 kg)    Height:       GENERAL:Alert, no distress and comfortable.  EYES:  no pallor or icterus OROPHARYNX: no thrush or ulceration;  NECK: supple, no masses felt LUNGS: clear to auscultation and  No wheeze or crackles HEART/CVS: regular rate & rhythm and no murmurs; No lower extremity edema ABDOMEN: abdomen soft, non-tender and normal bowel sounds Musculoskeletal:no cyanosis of digits and no clubbing  PSYCH: alert & oriented  NEURO: no focal motor/sensory deficits SKIN:  no rashes or significant lesions     CMP Latest Ref Rng & Units 06/22/2017  Glucose 65 - 99 mg/dL 100(H)  BUN 6 - 20 mg/dL 31(H)  Creatinine 0.44 - 1.00 mg/dL 1.34(H)  Sodium 135 - 145 mmol/L 139  Potassium 3.5 - 5.1 mmol/L 4.1  Chloride 101 - 111 mmol/L 113(H)  CO2 22 - 32 mmol/L 19(L)  Calcium 8.9 - 10.3 mg/dL  8.8(L)  Total Protein 6.5 - 8.1 g/dL -  Total Bilirubin 0.3 - 1.2 mg/dL -  Alkaline Phos 38 - 126 U/L -  AST 15 - 41 U/L -  ALT 14 - 54 U/L -   CBC Latest Ref Rng & Units 06/22/2017  WBC 3.6 - 11.0 K/uL 8.2  Hemoglobin 12.0 - 16.0 g/dL 11.1(L)  Hematocrit 35.0 - 47.0 % 33.0(L)  Platelets 150 - 440 K/uL 206     Dg Chest 2 View  Result Date: 06/21/2017 CLINICAL DATA:  Shortness of breath.  Evaluate for pneumonia. EXAM: CHEST  2 VIEW COMPARISON:  None. FINDINGS: The heart size and mediastinal contours are within normal limits. Normal pulmonary vascularity. No focal consolidation, pleural effusion, or pneumothorax. No acute osseous abnormality. IMPRESSION: No active cardiopulmonary disease. Electronically Signed   By: Titus Dubin M.D.   On: 06/21/2017 17:42   Dg Sacrum/coccyx  Result Date: 05/31/2017 CLINICAL DATA:  [place] 3 days ago with coccygeal pain EXAM: SACRUM AND COCCYX - 2+ VIEW COMPARISON:  None. FINDINGS: No sacrococcygeal fracture is seen. Alignment is normal. The bones are osteopenic. The SI joints appear corticated. The pelvic rami are intact. The sacral foramina appear corticated. IMPRESSION: No acute fracture.  Diffuse osteopenia. Electronically Signed   By: Ivar Drape M.D.   On: 05/31/2017 11:24   Ct Angio Chest Pe W And/or Wo Contrast  Result Date: 06/21/2017 CLINICAL DATA:  Shortness of breath for 3 days with generalized weakness. EXAM: CT ANGIOGRAPHY CHEST WITH CONTRAST TECHNIQUE: Multidetector CT imaging of the chest was performed using the standard protocol during bolus administration of intravenous contrast. Multiplanar CT image reconstructions and MIPs were obtained to evaluate the vascular anatomy. CONTRAST:  35mL ISOVUE-370 IOPAMIDOL (ISOVUE-370) INJECTION 76% COMPARISON:  None. FINDINGS: Cardiovascular: Tiny pericardial effusion noted. Heart size normal. No thoracic aortic aneurysm. Large volume pulmonary embolus identified with saddle configuration crossing the pulmonary artery bifurcation. Occlusive and nearly occlusive pulmonary embolus is seen in lobar and segmental arteries to the lower lobes and right middle lobe. Pulmonary embolus also noted in segmental branches to the left upper lobe. RV/LV ratio is 1.3. Mediastinum/Nodes: No mediastinal lymphadenopathy. There is no hilar lymphadenopathy. The esophagus has normal imaging features. There is no axillary lymphadenopathy. Lungs/Pleura: No dense focal airspace consolidation. Subtle peripheral ground-glass attenuation seen anterior upper lobes bilaterally with atelectasis noted in the lingula. No pulmonary edema or pleural effusion. No definite pulmonary infarct. 4 mm right upper lobe pulmonary nodule identified on image 43 of series 6. 3 mm right upper lobe pulmonary nodule seen on image 29. 2 mm left apical nodule visible on image 16 with other scattered tiny pulmonary nodules evident. Upper Abdomen: Surgical changes noted in the stomach. Kidneys incompletely visualized with cortical scarring noted in the left kidney. Musculoskeletal: Bone windows reveal no worrisome lytic or sclerotic osseous lesions. Review of the MIP images confirms the above findings. IMPRESSION: 1. Large saddle pulmonary embolus with  evidence of right heart strain. Positive for acute PE with CT evidence of right heart strain (RV/LV Ratio = 1.3) consistent with at least submassive (intermediate risk) PE. The presence of right heart strain has been associated with an increased risk of morbidity and mortality. Please activate Code PE by paging [phone]. 2. Tiny bilateral pulmonary nodules measuring up to 4 mm maximum size. No follow-up needed if patient is low-risk (and has no known or suspected primary neoplasm). Non-contrast chest CT can be considered in 12 months if patient is high-risk. This recommendation follows  the consensus statement: Guidelines for Management of Incidental Pulmonary Nodules Detected on CT Images: From the Fleischner Society 2017; Radiology 2017; 284:228-243. Critical Value/emergent results were called by me at the time of interpretation on 06/21/2017 at 7:10 pm to Dr. Mable Paris, who verbally acknowledged these results. Electronically Signed   By: Misty Stanley M.D.   On: 06/21/2017 19:11   Ct Pelvis Wo Contrast  Result Date: 05/31/2017 CLINICAL DATA:  Pelvic fracture, known or suspected. Fall 3 days ago with persistent lower back pain. EXAM: CT PELVIS WITHOUT CONTRAST TECHNIQUE: Multidetector CT imaging of the pelvis was performed following the standard protocol without intravenous contrast. COMPARISON:  Radiography earlier today FINDINGS: Urinary Tract:  Negative bladder. Bowel:  No evidence of injury to the visualized bowel. Vascular/Lymphatic: Negative Reproductive:  Hysterectomy. Musculoskeletal: There is a horizontal fracture through the posterior elements of the mid sacrum, at the S3-4 level, see sagittal reformats. On coronal reformats fracture through the lower sacral body/ala is seen on 6:48. No displacement along the sacral foramina or canal. No major muscular ligamentous injury is seen. Lower lumbar facet spurring.  Both hips are located and intact. IMPRESSION: Nondisplaced horizontal fracture through the  lower sacrum. Electronically Signed   By: Monte Fantasia M.D.   On: 05/31/2017 12:21   Dg Shoulder Left  Result Date: 06/22/2017 CLINICAL DATA:  Left shoulder pain after fall 3 weeks prior. EXAM: LEFT SHOULDER - 2+ VIEW COMPARISON:  None. FINDINGS: No fracture. No left glenohumeral dislocation. No evidence of left acromioclavicular separation. No suspicious focal osseous lesions. Mild osteoarthritis in the left glenohumeral and left acromioclavicular joints. Tiny clustered 4-5 mm densities overlying the proximal left humerus, cannot exclude loose intra-articular bodies. IMPRESSION: No fracture or dislocation. 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 Tiny clustered 4-5 mm densities overlying the proximal left humerus, cannot exclude loose intra-articular bodies. Electronically Signed   By: Ilona Sorrel M.D.   On: 06/22/2017 12:06   Dg Hip Unilat W Or Wo Pelvis 2-3 Views Left  Result Date: 05/31/2017 CLINICAL DATA:  [place] 3 days ago, coccygeal pain EXAM: DG HIP (WITH OR WITHOUT PELVIS) 2-3V LEFT COMPARISON:  None. FINDINGS: The bones are diffusely osteopenic. The pelvic rami are intact. The SI joints appear corticated. No fracture is seen. IMPRESSION: No fracture.  Diffuse osteopenia. Electronically Signed   By: Ivar Drape M.D.   On: 05/31/2017 11:23    Assessment and plan- Patient is a 76 y.o. female with recent history of fall/sacrum injury presented to emergency room due to generalized weakness and shortness of breath.  CT angiogram of chest is positive for large saddle pulmonary embolism with evidence of right heart strain.  #Agree with acute saddle PE with evidence of right heart strain, by recent likely provoked bedridden/immobilization secondary to recent fall. S/p mechanical thrombectomy today.  Agree with heparin gtt for now. Recommend continue another 24-48 hours heparin gtt and convert to Eliquis. Patient was given my card and she can follow up outpatient 1-2 weeks post  discharge.   #Tiny bilateral pulmonary nodules, follow-up outpatient   Thank you for this kind referral and the opportunity to participate in the care of this patient  Earlie Server, MD, PhD Hematology [place] at [facility] Pager- [phone] 06/22/2017

## 2017-06-22 NOTE — Progress Notes (Signed)
ANTICOAGULATION CONSULT NOTE - Initial Consult  Pharmacy Consult for heparin gtt Indication: pulmonary embolus  No Known Allergies  Patient Measurements: Height: 5\' 7"  (170.2 cm) Weight: 168 lb 3.2 oz (76.3 kg) IBW/kg (Calculated) : 61.6 Heparin Dosing Weight: 68kg  Vital Signs: Temp: 97.8 F (36.6 C) (03/01 0459) Temp Source: Oral (03/01 0459) BP: 124/72 (03/01 0930) Pulse Rate: 95 (03/01 0930)  Labs: Recent Labs    06/21/17 1719 06/21/17 2219 06/22/17 0159 06/22/17 1106  HGB 12.6  --  11.1*  --   HCT 38.4  --  33.0*  --   PLT 228  --  206  --   APTT  --  157*  --   --   LABPROT  --  15.0  --   --   INR  --  1.19  --   --   HEPARINUNFRC  --   --  0.82* 0.55  CREATININE 1.41*  --  1.34*  --   TROPONINI 0.03*  --   --   --     Estimated Creatinine Clearance: 38.7 mL/min (A) (by C-G formula based on SCr of 1.34 mg/dL (H)).   Medical History: Past Medical History:  Diagnosis Date  . Arthritis    hands  . 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 Chronic kidney disease    .  Takes Vasotec for kidneys.  . Depression   . Hyperlipemia   . Hypertension   . Sleep apnea     Medications:  Medications Prior to Admission  Medication Sig Dispense Refill Last Dose  . busPIRone (BUSPAR) 15 MG tablet Take 1 tablet by mouth 2 (two) times daily.   06/21/2017 at Unknown time  . calcitRIOL (ROCALTROL) 0.25 MCG capsule Take 1 capsule by mouth daily.   06/21/2017 at Unknown time  . calcium citrate-vitamin D (CITRACAL+D) 315-200 MG-UNIT tablet Take 1 tablet by mouth daily.    06/21/2017 at Unknown time  . enalapril (VASOTEC) 2.5 MG tablet Take 2.5 mg by mouth 2 (two) times daily.   06/21/2017 at Unknown time  . FLUoxetine (PROZAC) 20 MG capsule Take 60 mg by mouth daily.   06/21/2017 at Unknown time  . oxyCODONE-acetaminophen (PERCOCET) 5-325 MG tablet Take 1 tablet by mouth every 4 (four) hours as needed for severe pain. 20 tablet 0 Past Week at Unknown time  .  vitamin B-12 (CYANOCOBALAMIN) 100 MCG tablet Take 100 mcg by mouth daily.    06/21/2017 at Unknown time  . azithromycin (ZITHROMAX) 250 MG tablet Take 1 tablet (250 mg total) by mouth daily. Take first 2 tablets together, then 1 every day until finished. (Patient not taking: Reported on 05/31/2017) 6 tablet 0 Not Taking at Unknown time  . diphenhydramine-acetaminophen (TYLENOL PM) 25-500 MG TABS Take 2 tablets by mouth at bedtime as needed.   prn at prn  . docusate sodium (COLACE) 100 MG capsule Take 1 capsule (100 mg total) by mouth 2 (two) times daily. (Patient not taking: Reported on 06/21/2017) 60 capsule 0 Not Taking at Unknown time  . gatifloxacin (ZYMAXID) 0.5 % SOLN Place 1 drop into the left eye 4 (four) times daily. (Patient not taking: Reported on 12/07/2015)   Not Taking at Unknown time  . ondansetron (ZOFRAN ODT) 4 MG disintegrating tablet Take 1 tablet (4 mg total) by mouth every 8 (eight) hours as needed for nausea or vomiting. 20 tablet 0 prn at prn  . valACYclovir (VALTREX) 1000 MG tablet Take  1,000 mg by mouth 2 (two) times daily.   prn at prn   Scheduled:  . busPIRone  15 mg Oral BID  . calcitRIOL  0.25 mcg Oral Daily  . calcium citrate  1 tablet Oral Daily  . enalapril  2.5 mg Oral BID  . FLUoxetine  60 mg Oral Daily  . sodium chloride flush  3 mL Intravenous Q12H  . valACYclovir  1,000 mg Oral BID  . vitamin B-12  100 mcg Oral Daily   Infusions:  . sodium chloride    . sodium chloride    .  ceFAZolin (ANCEF) IV    . heparin 950 Units/hr (06/22/17 0307)   PRN:  Anti-infectives (From admission, onward)   Start     Dose/Rate Route Frequency Ordered Stop   06/22/17 1300  valACYclovir (VALTREX) tablet 1,000 mg     1,000 mg Oral 2 times daily 06/22/17 1225     06/22/17 0943  ceFAZolin (ANCEF) IVPB 2g/100 mL premix     2 g 200 mL/hr over 30 Minutes Intravenous 30 min pre-op 06/22/17 8676        Assessment: 77 year old female with PE, Pharmacy consulted for pe dosing of  heparin gtt per protocol.  3/1 11:00 Heparin level resulted at 0.55 Currently on Heparin at 950 units/hr  Noted that patient lost IV access this morning, spoke with RN and Heparin was off for about 10 minutes while IV access reestablished.  Goal of Therapy:  Heparin level 0.3-0.7 units/ml Monitor platelets by anticoagulation protocol: Yes   Plan:  Will continue with current rate of 950 units/hr and check a confirmatory level in 8 hours. Daily CBC while on Heparin infusion.  Paulina Fusi, PharmD, BCPS 06/22/2017 12:29 PM

## 2017-06-22 NOTE — Op Note (Signed)
[facility]  Percutaneous Study/Intervention Procedural Note   Date of Surgery: 06/22/2017,2:38 PM  Surgeon: Leotis Pain, MD  Pre-operative Diagnosis: Symptomatic bilateral pulmonary emboli with right heart strain  Post-operative diagnosis:  Same  Procedure(s) Performed:  1.  Contrast injection right heart  2.  Thrombolysis right and left pulmonary arteries with a total of 10 mg of TPA using 6 on the right and left  3.  Mechanical thrombectomy bilateral pulmonary arteries including bilateral main pulmonary arteries, the right middle and lower lobe pulmonary arteries, and the left lower lobe pulmonary artery  4.  Selective catheter placement right middle and lower lobe pulmonary arteries  5.  Selective catheter placement left lower lobe pulmonary artery    Anesthesia: Conscious sedation was administered under my direct supervision by the interventional radiology RN. IV Versed plus fentanyl were utilized. Continuous ECG, pulse oximetry and blood pressure was monitored throughout the entire procedure.  Versed and fentanyl were administered intravenously.  Conscious sedation was administered for a total of 30 minutes using 4 mg of Versed and 100 mcg of fentanyl.  Sheath: 8 French right femoral vein  Contrast: 45 cc   Fluoroscopy Time: 7.4 minutes  Indications:  Patient presents with pulmonary emboli. The patient is symptomatic with hypoxemia and dyspnea on exertion.  There is evidence of right heart strain on the CT angiogram. The patient is otherwise a good candidate for intervention and even the long-term benefits pulmonary angiography with thrombolysis is offered. The risks and benefits are reviewed long-term benefits are discussed. All questions are answered patient agrees to proceed.  Procedure:  Anayeli Arel Fordis a 77 y.o. female who was identified and appropriate procedural time out was performed.  The patient was then placed supine on the table and prepped and  draped in the usual sterile fashion.  Ultrasound was used to evaluate the right common femoral vein.  It was patent, as it was echolucent and compressible.  A digital ultrasound image was acquired for the permanent record.  A micropuncture needle was used to access the right common femoral vein under direct ultrasound guidance.  A microwire was then advanced under fluoroscopic guidance followed by micro-sheath.  A 0.035 J wire was advanced without resistance and a 5Fr sheath was placed and then upsized to an 8 [place] sheath.    The wire and pigtail catheter were then negotiated into the right atrium and bolus injection of contrast was utilized to demonstrate the right ventricle and the pulmonary artery outflow. The wire and catheter were then negotiated into the main pulmonary artery where hand injection of contrast was utilized to demonstrate the pulmonary arteries and confirm the locations of the pulmonary emboli.  3000 units of heparin was then given and allowed to circulate.  TPA was reconstituted and delivered onto the table. A total of 10 milligrams of TPA was utilized.  Four mg was administered on the left side and 6 mg was administered on the right side. This was then allowed to dwell for several minutes.  The Penumbra Cat 8 catheter was then advanced up into the pulmonary vasculature. The left lung was addressed first. Catheter was negotiated into the left main and then the left lower lobe pulmonary artery and mechanical thrombectomy was performed. Follow-up imaging demonstrated a good result with decreased clot burden.  The Penumbra Cat 8 catheter was then negotiated to the opposite side. The right lung was addressed second. Catheter was negotiated into the right main and then the right middle lobe  pulmonary artery and mechanical thrombectomy was performed. Follow-up imaging demonstrated a good result and therefore the catheter was renegotiated into the right lower lobe pulmonary artery and again  mechanical thrombectomy was performed. Passes were made with both the Penumbra catheter itself. Follow-up imaging was then performed showing improvement in the clot burden.  At this point, we elected to terminate the procedure.  The sheath is then pulled and pressures held. A safeguard is placed.    Findings:   Right heart imaging:  Right atrium and right ventricle and the pulmonary outflow tract appears normal  Right lung: Thrombus including the right main pulmonary artery, right middle lobe and right lower lobe pulmonary arteries.  Left lung: Thrombus including the left main pulmonary artery although there was a lesser amount on the.  It did advance largely in the left lower lobe with some in the left upper lobe.    Disposition: Patient was taken to the recovery room in stable condition having tolerated the procedure well.  Jason Dew 06/22/2017,2:38 PM

## 2017-06-23 LAB — CBC
HCT: 28.6 % — ABNORMAL LOW (ref 35.0–47.0)
Hemoglobin: 9.1 g/dL — ABNORMAL LOW (ref 12.0–16.0)
MCH: 30.6 pg (ref 26.0–34.0)
MCHC: 31.8 g/dL — ABNORMAL LOW (ref 32.0–36.0)
MCV: 96.3 fL (ref 80.0–100.0)
Platelets: 204 10*3/uL (ref 150–440)
RBC: 2.97 MIL/uL — AB (ref 3.80–5.20)
RDW: 17.9 % — ABNORMAL HIGH (ref 11.5–14.5)
WBC: 7.2 10*3/uL (ref 3.6–11.0)

## 2017-06-23 LAB — BASIC METABOLIC PANEL
ANION GAP: 9 (ref 5–15)
BUN: 26 mg/dL — ABNORMAL HIGH (ref 6–20)
CO2: 20 mmol/L — ABNORMAL LOW (ref 22–32)
Calcium: 8.9 mg/dL (ref 8.9–10.3)
Chloride: 110 mmol/L (ref 101–111)
Creatinine, Ser: 1.34 mg/dL — ABNORMAL HIGH (ref 0.44–1.00)
GFR calc non Af Amer: 38 mL/min — ABNORMAL LOW (ref >60)
GFR, EST AFRICAN AMERICAN: 44 mL/min — AB (ref >60)
Glucose, Bld: 81 mg/dL (ref 65–99)
POTASSIUM: 4.1 mmol/L (ref 3.5–5.1)
SODIUM: 139 mmol/L (ref 135–145)

## 2017-06-23 MED ORDER — THROMBIN (RECOMBINANT) 5000 UNITS EX SOLR
5000.0000 [IU] | Freq: Once | CUTANEOUS | Status: AC
  Administered 2017-06-23: 5000 [IU] via TOPICAL
  Filled 2017-06-23: qty 5000

## 2017-06-23 MED ORDER — APIXABAN 5 MG PO TABS
5.0000 mg | ORAL_TABLET | Freq: Two times a day (BID) | ORAL | Status: DC
  Administered 2017-06-23: 5 mg via ORAL
  Filled 2017-06-23: qty 1

## 2017-06-23 MED ORDER — APIXABAN 5 MG PO TABS: 5.0000 mg | ORAL_TABLET | Freq: Two times a day (BID) | ORAL | 0 refills | Status: DC

## 2017-06-23 NOTE — Care Management Note (Addendum)
Case Management Note  Patient Details  Name: Ann Russell MRN: [id number] Date of Birth: [birth year]  Subjective/Objective:    Resumption of care orders called to [facility] for PT, RN, [place], [place].   Home via [employer].              Action/Plan:   Expected Discharge Date:  06/23/17               Expected Discharge Plan:     In-House Referral:     Discharge planning Services     Post Acute Care Choice:    Choice offered to:     DME Arranged:    DME Agency:     HH Arranged:    HH Agency:     Status of Service:     If discussed at [employer], dates discussed:    Additional Comments:  Ellaree Gear A, RN 06/23/2017, 10:01 AM

## 2017-06-23 NOTE — Progress Notes (Signed)
Pt heparin drip restarted at 0400 AM. Will continue to monitor.

## 2017-06-23 NOTE — Progress Notes (Addendum)
Doctor Dew ordered to apply  thrombin recombinant (RECOTHROm) solution syring 5,000 units and stopped heparin for 1 hour then restart at 400 AM. If pt still have some bleeding oozing from the site he ordered to put pressure on the site for 15 minutes and stop heparin completely. Will continue to monitor.

## 2017-06-23 NOTE — Progress Notes (Signed)
[facility]  Daily Progress Note   Subjective  - 1 Day Post-Op  Patient feeling much better this morning.  Off oxygen without dyspnea.  Oxygen saturations greater than 95% overnight.  Had some bleeding from her access site but this has stopped.  This was fairly mild.  Objective Vitals:   06/23/17 0408 06/23/17 0438 06/23/17 0532 06/23/17 0814  BP: 129/66   (!) 126/55  Pulse: 71 74  74  Resp:    18  Temp: 98.3 F (36.8 C)   98 F (36.7 C)  TempSrc: Oral   Oral  SpO2: 97% 95% 96% 95%  Weight:      Height:        Intake/Output Summary (Last 24 hours) at 06/23/2017 0854 Last data filed at 06/23/2017 0839 Gross per 24 hour  Intake 278.15 ml  Output 1750 ml  Net -1471.85 ml    PULM  CTAB CV  RRR VASC  access site is clean, dry, and intact without significant hematoma or signs of active bleeding.  Laboratory CBC    Component Value Date/Time   WBC 7.2 06/23/2017 0642   HGB 9.1 (L) 06/23/2017 0642   HCT 28.6 (L) 06/23/2017 0642   PLT 204 06/23/2017 0642    BMET    Component Value Date/Time   NA 139 06/23/2017 0642   K 4.1 06/23/2017 0642   CL 110 06/23/2017 0642   CO2 20 (L) 06/23/2017 0642   GLUCOSE 81 06/23/2017 0642   BUN 26 (H) 06/23/2017 0642   CREATININE 1.34 (H) 06/23/2017 0642   CALCIUM 8.9 06/23/2017 0642   GFRNONAA 38 (L) 06/23/2017 0642   GFRAA 44 (L) 06/23/2017 1950    Assessment/Planning: POD #1 s/p pulmonary thrombolysis and thrombectomy   Doing quite well.  Creatinine is stable.  Contrast use was limited.  Oxygen saturations are excellent off of oxygen.  No longer dyspneic.  Okay to transition to oral anticoagulant such as Eliquis and stop heparin.  Stable for discharge from vascular point of view when felt safe by primary service.   Can follow-up with me or my PA in the office in 3-4 weeks if desired.    Leotis Pain  06/23/2017, 8:54 AM

## 2017-06-23 NOTE — Progress Notes (Signed)
ANTICOAGULATION CONSULT NOTE - Initial Consult  Pharmacy Consult for heparin gtt Indication: pulmonary embolus  No Known Allergies  Patient Measurements: Height: 5\' 7"  (170.2 cm) Weight: 168 lb 3.2 oz (76.3 kg) IBW/kg (Calculated) : 61.6 Heparin Dosing Weight: 68kg  Vital Signs: Temp: 98.3 F (36.8 C) (03/02 0408) Temp Source: Oral (03/02 0408) BP: 129/66 (03/02 0408) Pulse Rate: 74 (03/02 0438)  Labs: Recent Labs    06/21/17 1719 06/21/17 2219  06/22/17 0159 06/22/17 1106 06/22/17 1908 06/22/17 2043  HGB 12.6  --   --  11.1*  --   --   --   HCT 38.4  --   --  33.0*  --   --   --   PLT 228  --   --  206  --   --   --   APTT  --  157*  --   --   --   --   --   LABPROT  --  15.0  --   --   --   --   --   INR  --  1.19  --   --   --   --   --   HEPARINUNFRC  --   --    < > 0.82* 0.55 1.08* 1.00*  CREATININE 1.41*  --   --  1.34*  --   --   --   TROPONINI 0.03*  --   --   --   --   --   --    < > = values in this interval not displayed.    Estimated Creatinine Clearance: 38.7 mL/min (A) (by C-G formula based on SCr of 1.34 mg/dL (H)).   Medical History: Past Medical History:  Diagnosis Date  . Arthritis    hands  . 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 Chronic kidney disease    .  Takes Vasotec for kidneys.  . Depression   . Hyperlipemia   . Hypertension   . Sleep apnea     Medications:  Medications Prior to Admission  Medication Sig Dispense Refill Last Dose  . busPIRone (BUSPAR) 15 MG tablet Take 1 tablet by mouth 2 (two) times daily.   06/21/2017 at Unknown time  . calcitRIOL (ROCALTROL) 0.25 MCG capsule Take 1 capsule by mouth daily.   06/21/2017 at Unknown time  . calcium citrate-vitamin D (CITRACAL+D) 315-200 MG-UNIT tablet Take 1 tablet by mouth daily.    06/21/2017 at Unknown time  . enalapril (VASOTEC) 2.5 MG tablet Take 2.5 mg by mouth 2 (two) times daily.   06/21/2017 at Unknown time  . FLUoxetine (PROZAC) 20 MG capsule Take 60  mg by mouth daily.   06/21/2017 at Unknown time  . oxyCODONE-acetaminophen (PERCOCET) 5-325 MG tablet Take 1 tablet by mouth every 4 (four) hours as needed for severe pain. 20 tablet 0 Past Week at Unknown time  . vitamin B-12 (CYANOCOBALAMIN) 100 MCG tablet Take 100 mcg by mouth daily.    06/21/2017 at Unknown time  . azithromycin (ZITHROMAX) 250 MG tablet Take 1 tablet (250 mg total) by mouth daily. Take first 2 tablets together, then 1 every day until finished. (Patient not taking: Reported on 05/31/2017) 6 tablet 0 Not Taking at Unknown time  . diphenhydramine-acetaminophen (TYLENOL PM) 25-500 MG TABS Take 2 tablets by mouth at bedtime as needed.   prn at prn  . docusate sodium (COLACE) 100 MG capsule Take 1 capsule (100  mg total) by mouth 2 (two) times daily. (Patient not taking: Reported on 06/21/2017) 60 capsule 0 Not Taking at Unknown time  . gatifloxacin (ZYMAXID) 0.5 % SOLN Place 1 drop into the left eye 4 (four) times daily. (Patient not taking: Reported on 12/07/2015)   Not Taking at Unknown time  . ondansetron (ZOFRAN ODT) 4 MG disintegrating tablet Take 1 tablet (4 mg total) by mouth every 8 (eight) hours as needed for nausea or vomiting. 20 tablet 0 prn at prn  . valACYclovir (VALTREX) 1000 MG tablet Take 1,000 mg by mouth 2 (two) times daily as needed.    prn at prn   Scheduled:  . busPIRone  15 mg Oral BID  . calcitRIOL  0.25 mcg Oral Daily  . calcium citrate  1 tablet Oral Daily  . enalapril  2.5 mg Oral BID  . FLUoxetine  60 mg Oral Daily  . sodium chloride flush  3 mL Intravenous Q12H  . vitamin B-12  100 mcg Oral Daily   Infusions:  . sodium chloride    . heparin 750 Units/hr (06/23/17 0400)   PRN:  Anti-infectives (From admission, onward)   Start     Dose/Rate Route Frequency Ordered Stop   06/22/17 2130  valACYclovir (VALTREX) tablet 1,000 mg     1,000 mg Oral 2 times daily PRN 06/22/17 2121     06/22/17 1300  valACYclovir (VALTREX) tablet 1,000 mg  Status:  Discontinued      1,000 mg Oral 2 times daily 06/22/17 1225 06/22/17 2121   06/22/17 0943  ceFAZolin (ANCEF) IVPB 2g/100 mL premix  Status:  Discontinued     2 g 200 mL/hr over 30 Minutes Intravenous 30 min pre-op 06/22/17 2620 06/22/17 1544      Assessment: 77 year old female with PE, Pharmacy consulted for pe dosing of heparin gtt per protocol.  3/1 11:00 Heparin level resulted at 0.55 Currently on Heparin at 950 units/hr  Noted that patient lost IV access this morning, spoke with RN and Heparin was off for about 10 minutes while IV access reestablished.  Goal of Therapy:  Heparin level 0.3-0.7 units/ml Monitor platelets by anticoagulation protocol: Yes   Plan:  Will continue with current rate of 950 units/hr and check a confirmatory level in 8 hours. Daily CBC while on Heparin infusion.  03/01 @ 2130: HL= 1. Will pause infusion x 1 hour and resume at 750 units/hr. Will recheck HL 8 hours after resuming infusion.   03/02 @ 2134 heparin was stopped for an anti-Xa of 1. Heparin was restarted @ 750 units/hr @ 2230 and then drip was re-stopped @ 0300 because patient was bleeding out of the right AC. Spoke w/ RN and vascular to recommend topical thrombin to help stop the bleeding. Upon application of thrombin, bleeding became controlled and heparin was restarted @ 0400. Will recheck a HL @ 1200. Will monitor CBC w/ am labs today.  Follow-up for plans to transition to eliquis per pulmonary.  Tobie Lords, PharmD, BCPS Clinical Pharmacist  06/23/2017

## 2017-06-23 NOTE — Discharge Summary (Signed)
[facility] NAME: Ann Russell    MR#:  [id number]  DATE OF BIRTH:  17-Nov-1940  DATE OF ADMISSION:  06/21/2017 ADMITTING PHYSICIAN: Saundra Shelling, MD  DATE OF DISCHARGE: 06/23/2017  PRIMARY CARE PHYSICIAN: Idelle Crouch, MD    ADMISSION DIAGNOSIS:  Acute respiratory failure with hypoxia ([place]) [J96.01] Acute saddle pulmonary embolism with acute cor pulmonale ([place]) [I26.02]  DISCHARGE DIAGNOSIS:  Active Problems:   Pulmonary embolism ([place])   SECONDARY DIAGNOSIS:   Past Medical History:  Diagnosis Date  . Arthritis    hands  . 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 Chronic kidney disease    .  Takes Vasotec for kidneys.  . Depression   . Hyperlipemia   . Hypertension   . Sleep apnea     HOSPITAL COURSE:    77 year old female with a history of squamous cell Cancer the skin, chronic kidney disease stage III, sleep apnea and recent sacral fracture who has been predominantly bedbound presents with shortness of breath.  1. Acute hypoxic respiratory failure in the setting of large saddle pulmonary emboli Patient has been weaned to room air  2. Large saddle pulmonary emboli/Near occlusive DVT within the right popliteal vein: Patient was evaluated by vascular surgery, pulmonary and oncology. Patient underwent thrombectomy. She is doing quite well. Heparin was transitioned to oral anticoagulation. Side effects, alternatives, risks and benefits were discussed with the patient and she accepts the risks.   3. Left shoulder pain after mechanical fall a few weeks ago: X-ray does not show evidence of fracture There are loose intra-articular bodies noted. Patient was evaluated by orthopedic surgery.She will follow-up with Dr. Posey Pronto as an outpatient. She will likely need an MRI of her left shoulder to evaluate rotator cuff. Working diagnosis of left shoulder pain is rotator cuff etiology. Until then there are no  restriction on her left shoulder use.  4. Essential hypertension: Continue Vasotec  5. Depression: Continue Prozac and BuSpar  6. chronic kidney disease stage III: Creatinine at baseline     DISCHARGE CONDITIONS AND DIET:   Stable for discharge on heart healthy diet  CONSULTS OBTAINED:  Treatment Team:  Algernon Huxley, MD Twana First, MD  DRUG ALLERGIES:  No Known Allergies  DISCHARGE MEDICATIONS:   Allergies as of 06/23/2017   No Known Allergies     Medication List    STOP taking these medications   azithromycin 250 MG tablet Commonly known as:  ZITHROMAX   docusate sodium 100 MG capsule Commonly known as:  COLACE   gatifloxacin 0.5 % Soln Commonly known as:  ZYMAXID     TAKE these medications   apixaban 5 MG Tabs tablet Commonly known as:  ELIQUIS Take 1 tablet (5 mg total) by mouth 2 (two) times daily.   busPIRone 15 MG tablet Commonly known as:  BUSPAR Take 1 tablet by mouth 2 (two) times daily.   calcitRIOL 0.25 MCG capsule Commonly known as:  ROCALTROL Take 1 capsule by mouth daily.   calcium citrate-vitamin D 315-200 MG-UNIT tablet Commonly known as:  CITRACAL+D Take 1 tablet by mouth daily.   diphenhydramine-acetaminophen 25-500 MG Tabs tablet Commonly known as:  TYLENOL PM Take 2 tablets by mouth at bedtime as needed.   enalapril 2.5 MG tablet Commonly known as:  VASOTEC Take 2.5 mg by mouth 2 (two) times daily.   FLUoxetine 20 MG capsule Commonly known as:  PROZAC Take  60 mg by mouth daily.   ondansetron 4 MG disintegrating tablet Commonly known as:  ZOFRAN ODT Take 1 tablet (4 mg total) by mouth every 8 (eight) hours as needed for nausea or vomiting.   oxyCODONE-acetaminophen 5-325 MG tablet Commonly known as:  PERCOCET Take 1 tablet by mouth every 4 (four) hours as needed for severe pain.   valACYclovir 1000 MG tablet Commonly known as:  VALTREX Take 1,000 mg by mouth 2 (two) times daily as needed.   vitamin B-12 100 MCG  tablet Commonly known as:  CYANOCOBALAMIN Take 100 mcg by mouth daily.         Today   CHIEF COMPLAINT:   Patient is doing well this point. Denies shortness of breath or chest pain.   VITAL SIGNS:  Blood pressure (!) 126/55, pulse 74, temperature 98 F (36.7 C), temperature source Oral, resp. rate 18, height 5\' 7"  (1.702 m), weight 76.3 kg (168 lb 3.2 oz), SpO2 95 %.   REVIEW OF SYSTEMS:  Review of Systems  Constitutional: Negative.  Negative for chills, fever and malaise/fatigue.  HENT: Negative.  Negative for ear discharge, ear pain, hearing loss, nosebleeds and sore throat.   Eyes: Negative.  Negative for blurred vision and pain.  Respiratory: Negative.  Negative for cough, hemoptysis, shortness of breath and wheezing.   Cardiovascular: Negative.  Negative for chest pain, palpitations and leg swelling.  Gastrointestinal: Negative.  Negative for abdominal pain, blood in stool, diarrhea, nausea and vomiting.  Genitourinary: Negative.  Negative for dysuria.  Musculoskeletal: Positive for joint pain (left shoulder). Negative for back pain.  Skin: Negative.   Neurological: Negative for dizziness, tremors, speech change, focal weakness, seizures and headaches.  Endo/Heme/Allergies: Negative.  Does not bruise/bleed easily.  Psychiatric/Behavioral: Negative.  Negative for depression, hallucinations and suicidal ideas.     PHYSICAL EXAMINATION:  GENERAL:  77 y.o.-year-old patient lying in the bed with no acute distress.  NECK:  Supple, no jugular venous distention. No thyroid enlargement, no tenderness.  LUNGS: Normal breath sounds bilaterally, no wheezing, rales,rhonchi  No use of accessory muscles of respiration.  CARDIOVASCULAR: S1, S2 normal. No murmurs, rubs, or gallops.  ABDOMEN: Soft, non-tender, non-distended. Bowel sounds present. No organomegaly or mass.  EXTREMITIES: No pedal edema, cyanosis, or clubbing.  PSYCHIATRIC: The patient is alert and oriented x 3.  SKIN:  No obvious rash, lesion, or ulcer.   DATA REVIEW:   CBC Recent Labs  Lab 06/23/17 0642  WBC 7.2  HGB 9.1*  HCT 28.6*  PLT 204    Chemistries  Recent Labs  Lab 06/21/17 1719  06/23/17 0642  NA 139   < > 139  K 4.5   < > 4.1  CL 109   < > 110  CO2 21*   < > 20*  GLUCOSE 99   < > 81  BUN 32*   < > 26*  CREATININE 1.41*   < > 1.34*  CALCIUM 9.6   < > 8.9  AST 30  --   --   ALT 29  --   --   ALKPHOS 110  --   --   BILITOT 0.8  --   --    < > = values in this interval not displayed.    Cardiac Enzymes Recent Labs  Lab 06/21/17 1719  TROPONINI 0.03*    Microbiology Results  @[id number] @  RADIOLOGY:  Dg Chest 2 View  Result Date: 06/21/2017 CLINICAL DATA:  Shortness of breath.  Evaluate for pneumonia.  EXAM: CHEST  2 VIEW COMPARISON:  None. FINDINGS: The heart size and mediastinal contours are within normal limits. Normal pulmonary vascularity. No focal consolidation, pleural effusion, or pneumothorax. No acute osseous abnormality. IMPRESSION: No active cardiopulmonary disease. Electronically Signed   By: Titus Dubin M.D.   On: 06/21/2017 17:42   Ct Angio Chest Pe W And/or Wo Contrast  Result Date: 06/21/2017 CLINICAL DATA:  Shortness of breath for 3 days with generalized weakness. EXAM: CT ANGIOGRAPHY CHEST WITH CONTRAST TECHNIQUE: Multidetector CT imaging of the chest was performed using the standard protocol during bolus administration of intravenous contrast. Multiplanar CT image reconstructions and MIPs were obtained to evaluate the vascular anatomy. CONTRAST:  17mL ISOVUE-370 IOPAMIDOL (ISOVUE-370) INJECTION 76% COMPARISON:  None. FINDINGS: Cardiovascular: Tiny pericardial effusion noted. Heart size normal. No thoracic aortic aneurysm. Large volume pulmonary embolus identified with saddle configuration crossing the pulmonary artery bifurcation. Occlusive and nearly occlusive pulmonary embolus is seen in lobar and segmental arteries to the lower lobes and right  middle lobe. Pulmonary embolus also noted in segmental branches to the left upper lobe. RV/LV ratio is 1.3. Mediastinum/Nodes: No mediastinal lymphadenopathy. There is no hilar lymphadenopathy. The esophagus has normal imaging features. There is no axillary lymphadenopathy. Lungs/Pleura: No dense focal airspace consolidation. Subtle peripheral ground-glass attenuation seen anterior upper lobes bilaterally with atelectasis noted in the lingula. No pulmonary edema or pleural effusion. No definite pulmonary infarct. 4 mm right upper lobe pulmonary nodule identified on image 43 of series 6. 3 mm right upper lobe pulmonary nodule seen on image 29. 2 mm left apical nodule visible on image 16 with other scattered tiny pulmonary nodules evident. Upper Abdomen: Surgical changes noted in the stomach. Kidneys incompletely visualized with cortical scarring noted in the left kidney. Musculoskeletal: Bone windows reveal no worrisome lytic or sclerotic osseous lesions. Review of the MIP images confirms the above findings. IMPRESSION: 1. Large saddle pulmonary embolus with evidence of right heart strain. Positive for acute PE with CT evidence of right heart strain (RV/LV Ratio = 1.3) consistent with at least submassive (intermediate risk) PE. The presence of right heart strain has been associated with an increased risk of morbidity and mortality. Please activate Code PE by paging [phone]. 2. Tiny bilateral pulmonary nodules measuring up to 4 mm maximum size. No follow-up needed if patient is low-risk (and has no known or suspected primary neoplasm). Non-contrast chest CT can be considered in 12 months if patient is high-risk. This recommendation follows the consensus statement: Guidelines for Management of Incidental Pulmonary Nodules Detected on CT Images: From the Fleischner Society 2017; Radiology 2017; 284:228-243. Critical Value/emergent results were called by me at the time of interpretation on 06/21/2017 at 7:10 pm to  Dr. Mable [place], who verbally acknowledged these results. Electronically Signed   By: Misty Stanley M.D.   On: 06/21/2017 19:11   US Venous Img Lower Bilateral  Result Date: 06/22/2017 CLINICAL DATA:  History of pulmonary embolism.  Evaluate for DVT. EXAM: BILATERAL LOWER EXTREMITY VENOUS DOPPLER ULTRASOUND TECHNIQUE: Gray-scale sonography with graded compression, as well as color Doppler and duplex ultrasound were performed to evaluate the lower extremity deep venous systems from the level of the common femoral vein and including the common femoral, femoral, profunda femoral, popliteal and calf veins including the posterior tibial, peroneal and gastrocnemius veins when visible. The superficial great saphenous vein was also interrogated. Spectral Doppler was utilized to evaluate flow at rest and with distal augmentation maneuvers in the common femoral, femoral and popliteal veins. COMPARISON:  None. FINDINGS: RIGHT LOWER EXTREMITY Common Femoral Vein: Note, evaluation of the right common femoral vein is degraded secondary overlying bandages. No evidence of thrombus. Normal compressibility, respiratory phasicity and response to augmentation. Saphenofemoral Junction: No evidence of thrombus. Normal compressibility and flow on color Doppler imaging. Profunda Femoral Vein: No evidence of thrombus. Normal compressibility and flow on color Doppler imaging. Femoral Vein: No evidence of thrombus. Normal compressibility, respiratory phasicity and response to augmentation. Popliteal Vein: There is mixed echogenic near occlusive DVT seen throughout the right popliteal vein (images 19 - 25). Calf Veins: No evidence of thrombus. Normal compressibility and flow on color Doppler imaging. Superficial Great Saphenous Vein: No evidence of thrombus. Normal compressibility. Venous Reflux:  None. Other Findings:  None. LEFT LOWER EXTREMITY Common Femoral Vein: No evidence of thrombus. Normal compressibility, respiratory phasicity and  response to augmentation. Saphenofemoral Junction: No evidence of thrombus. Normal compressibility and flow on color Doppler imaging. Profunda Femoral Vein: No evidence of thrombus. Normal compressibility and flow on color Doppler imaging. Femoral Vein: No evidence of thrombus. Normal compressibility, respiratory phasicity and response to augmentation. Popliteal Vein: No evidence of thrombus. Normal compressibility, respiratory phasicity and response to augmentation. Calf Veins: No evidence of thrombus. Normal compressibility and flow on color Doppler imaging. Superficial Great Saphenous Vein: No evidence of thrombus. Normal compressibility. Venous Reflux:  None. Other Findings:  None. IMPRESSION: 1. Near occlusive DVT within the right popliteal vein. 2. No evidence of DVT within the left lower extremity. Electronically Signed   By: Sandi Mariscal M.D.   On: 06/22/2017 17:06   Dg Shoulder Left  Result Date: 06/22/2017 CLINICAL DATA:  Left shoulder pain after fall 3 weeks prior. EXAM: LEFT SHOULDER - 2+ VIEW COMPARISON:  None. FINDINGS: No fracture. No left glenohumeral dislocation. No evidence of left acromioclavicular separation. No suspicious focal osseous lesions. Mild osteoarthritis in the left glenohumeral and left acromioclavicular joints. Tiny clustered 4-5 mm densities overlying the proximal left humerus, cannot exclude loose intra-articular bodies. IMPRESSION: No fracture or dislocation. 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 Tiny clustered 4-5 mm densities overlying the proximal left humerus, cannot exclude loose intra-articular bodies. Electronically Signed   By: Ilona Sorrel M.D.   On: 06/22/2017 12:06      Allergies as of 06/23/2017   No Known Allergies     Medication List    STOP taking these medications   azithromycin 250 MG tablet Commonly known as:  ZITHROMAX   docusate sodium 100 MG capsule Commonly known as:  COLACE   gatifloxacin 0.5 % Soln Commonly known  as:  ZYMAXID     TAKE these medications   apixaban 5 MG Tabs tablet Commonly known as:  ELIQUIS Take 1 tablet (5 mg total) by mouth 2 (two) times daily.   busPIRone 15 MG tablet Commonly known as:  BUSPAR Take 1 tablet by mouth 2 (two) times daily.   calcitRIOL 0.25 MCG capsule Commonly known as:  ROCALTROL Take 1 capsule by mouth daily.   calcium citrate-vitamin D 315-200 MG-UNIT tablet Commonly known as:  CITRACAL+D Take 1 tablet by mouth daily.   diphenhydramine-acetaminophen 25-500 MG Tabs tablet Commonly known as:  TYLENOL PM Take 2 tablets by mouth at bedtime as needed.   enalapril 2.5 MG tablet Commonly known as:  VASOTEC Take 2.5 mg by mouth 2 (two) times daily.   FLUoxetine 20 MG capsule Commonly known as:  PROZAC Take 60 mg by mouth daily.   ondansetron 4 MG disintegrating tablet Commonly  known as:  ZOFRAN ODT Take 1 tablet (4 mg total) by mouth every 8 (eight) hours as needed for nausea or vomiting.   oxyCODONE-acetaminophen 5-325 MG tablet Commonly known as:  PERCOCET Take 1 tablet by mouth every 4 (four) hours as needed for severe pain.   valACYclovir 1000 MG tablet Commonly known as:  VALTREX Take 1,000 mg by mouth 2 (two) times daily as needed.   vitamin B-12 100 MCG tablet Commonly known as:  CYANOCOBALAMIN Take 100 mcg by mouth daily.          Management plans discussed with the patient and she is in agreement. Stable for discharge home  Patient should follow up with pcp  CODE STATUS:     Code Status Orders  (From admission, onward)        Start     Ordered   06/21/17 2119  Full code  Continuous     06/21/17 2118    Code Status History    Date Active Date Inactive Code Status Order ID Comments User Context   This patient has a current code status but no historical code status.    Advance Directive Documentation     Most Recent Value  Type of Advance Directive  Living will, Healthcare Power of Attorney  Pre-existing out of  facility DNR order (yellow form or pink MOST form)  No data  "MOST" Form in Place?  No data      TOTAL TIME TAKING CARE OF THIS PATIENT: 38 minutes.    Note: This dictation was prepared with Dragon dictation along with smaller phrase technology. Any transcriptional errors that result from this process are unintentional.  Sharif Rendell M.D on 06/23/2017 at 9:17 AM  Between 7am to 6pm - Pager - [phone] After 6pm go to [email or web address] - password [email or web address] [facility]  Office  [phone]  CC: Primary care physician; Idelle Crouch, MD

## 2017-06-23 NOTE — Progress Notes (Signed)
Pt femoral site has no signs of bleeding. Heparin still infusing at 7.5. Will continue to monitor.

## 2017-06-23 NOTE — Progress Notes (Signed)
Patient discharged via wheelchair and private vehicle. IV removed and catheter intact. All discharge instructions given and patient verbalizes understanding. Tele removed and returned. No prescriptions given to patient No distress noted.   

## 2017-06-23 NOTE — Progress Notes (Incomplete)
Pt still have some blood oozing from the site. Page Doctor Duane Boston and ordered to reinforce pressure by placing a gauze on top of the PAD. Applied gauze on top of the PAD. Also page Vascular and awai callback. Will continue to monitor.

## 2017-06-23 NOTE — Progress Notes (Signed)
Pt site still oozing blood. Notify doctor Duane Boston and ordered to place a guaze dressing on top of PAD to reinforce pressure. Gauze was place on top of PAD. Vascular was page. Awaiting callback. Will continue to monitor.

## 2017-06-25 ENCOUNTER — Encounter: Payer: Self-pay | Admitting: Vascular Surgery

## 2017-06-27 ENCOUNTER — Telehealth: Payer: Self-pay

## 2017-06-27 NOTE — Telephone Encounter (Signed)
Flagged on EMMI report for not having a follow-up appointment scheduled.  Attempted to reach, however no answer.  Will attempt at later time.

## 2017-06-28 NOTE — Telephone Encounter (Signed)
Second attempt at reaching patient made 06/28/17 at 11:54am.  Left message asking patient to return call.

## 2017-06-29 NOTE — Telephone Encounter (Signed)
Patient left VM on 3/7 after I had left for the day.  Returned her call this morning and was able to reach her.   Patient aware that she has four follow up appointments to schedule (Dr. Doy Hutching, Dr. Lucky Cowboy, Dr. Tasia Catchings, and Dr. Posey Pronto), though is waiting until she has more strength before venturing out of the house as she's worried about going down the stairs. States she's feeling stronger today and may call today or Monday to set up appointments. She mentioned staff from [facility] have been out to see her on a regular basis and that her wound is healing well and that she's been using a walker to move around the house.  No further concerns at this time and wanted to express her gratitude to the 2A nursing staff for taking good care of her during her stay.

## 2017-07-02 ENCOUNTER — Ambulatory Visit: Payer: Medicare Other | Admitting: Oncology

## 2017-07-02 ENCOUNTER — Inpatient Hospital Stay: Payer: Medicare Other | Attending: Oncology | Admitting: Oncology

## 2017-07-02 DIAGNOSIS — N189 Chronic kidney disease, unspecified: Secondary | ICD-10-CM | POA: Insufficient documentation

## 2017-07-02 DIAGNOSIS — Z7901 Long term (current) use of anticoagulants: Secondary | ICD-10-CM | POA: Insufficient documentation

## 2017-07-02 DIAGNOSIS — I2692 Saddle embolus of pulmonary artery without acute cor pulmonale: Secondary | ICD-10-CM | POA: Insufficient documentation

## 2017-07-02 DIAGNOSIS — D631 Anemia in chronic kidney disease: Secondary | ICD-10-CM | POA: Insufficient documentation

## 2017-07-13 ENCOUNTER — Other Ambulatory Visit: Payer: Self-pay

## 2017-07-13 ENCOUNTER — Inpatient Hospital Stay (HOSPITAL_BASED_OUTPATIENT_CLINIC_OR_DEPARTMENT_OTHER): Payer: Medicare Other | Admitting: Oncology

## 2017-07-13 ENCOUNTER — Encounter: Payer: Self-pay | Admitting: Oncology

## 2017-07-13 VITALS — BP 126/81 | HR 95 | Temp 96.9°F | Resp 18 | Ht 69.69 in | Wt 145.4 lb

## 2017-07-13 DIAGNOSIS — N189 Chronic kidney disease, unspecified: Secondary | ICD-10-CM | POA: Diagnosis not present

## 2017-07-13 DIAGNOSIS — D649 Anemia, unspecified: Secondary | ICD-10-CM

## 2017-07-13 DIAGNOSIS — I2692 Saddle embolus of pulmonary artery without acute cor pulmonale: Secondary | ICD-10-CM | POA: Diagnosis not present

## 2017-07-13 DIAGNOSIS — D631 Anemia in chronic kidney disease: Secondary | ICD-10-CM

## 2017-07-13 DIAGNOSIS — Z7901 Long term (current) use of anticoagulants: Secondary | ICD-10-CM | POA: Diagnosis not present

## 2017-07-13 DIAGNOSIS — I2602 Saddle embolus of pulmonary artery with acute cor pulmonale: Secondary | ICD-10-CM | POA: Diagnosis not present

## 2017-07-13 NOTE — Progress Notes (Signed)
Here for new pt evaluation.  

## 2017-07-13 NOTE — Progress Notes (Signed)
Hematology/Oncology Follow Up Note [facility]  Telephone:([phone] [phone]  Patient Care Team: Idelle Crouch, MD as PCP - General (Internal Medicine)   Name of the patient: Ann Russell  [id number]  1940/08/21   REASON FOR VISIT Follow up after hospitalization for PE  HISTORY OF PRESENT ILLNESS AKERIA HEDSTROM is a  77 y.o.  female with PMH listed below who was referred to me for Follow up after hospitalization for PE This is a pleasant 77 year old female with past medical history listed as below presented to emergency room due to generalized weakness for the past 2 weeks and shortness of breath for 3 days.  Patient had a fall at home 3 weeks ago and has been bedridden. CT angiogram of chest is positive for saddle pulmonary embolism. [place] LE showed Near occlusive DVT within the right popliteal vein. Patient was started on IV heparin drip for anticoagulation.  Denies any history of hematemesis, hemoptysis. S/p thrombectomy by vascular surgeon during hospitalization.  Taking Eliquis 5mg  BID, denies SOB, chest pain, or any bleeding events. No black stool. She get home health/Physical therapy at home to improve her strength. Walks with a walker. Lives with a friend.  She showed me an area of skin lesion for which she has appointment with dermatology next week for evaluation.   Review of systems Review of Systems  Constitutional: Negative for chills and fever.  HENT: Negative for ear discharge and nosebleeds.   Eyes: Negative for pain.  Respiratory: Negative for cough and sputum production.   Cardiovascular: Negative for chest pain and claudication.  Gastrointestinal: Negative for heartburn, nausea and vomiting.  Genitourinary: Negative for dysuria.  Musculoskeletal: Negative for back pain and myalgias.  Skin: Negative for rash.  Neurological: Negative for dizziness.  Endo/Heme/Allergies: Bruises/bleeds easily.  Psychiatric/Behavioral: Negative  for depression and suicidal ideas.    No Known Allergies   Past Medical History:  Diagnosis Date  . Arthritis    hands  . 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 Chronic kidney disease    .  Takes Vasotec for kidneys.  . Depression   . Hyperlipemia   . Hypertension   . Sleep apnea      Past Surgical History:  Procedure Laterality Date  . ABDOMINAL HYSTERECTOMY    . Buniectomy     bil  . CHOLECYSTECTOMY    . FACIAL COSMETIC SURGERY  2011  . GASTRIC BYPASS  2010  . PULMONARY THROMBECTOMY Bilateral 06/22/2017   Procedure: PULMONARY THROMBECTOMY;  Surgeon: Algernon Huxley, MD;  Location: [facility];  Service: Cardiovascular;  Laterality: Bilateral;  . Rotor cuff     right  . SCLERAL BUCKLE  11/07/2011   Procedure: SCLERAL BUCKLE;  Surgeon: Hayden Pedro, MD;  Location: [facility];  Service: Ophthalmology;  Laterality: Left;    Social History   Socioeconomic History  . Marital status: Single    Spouse name: Not on file  . Number of children: Not on file  . Years of education: Not on file  . Highest education level: Not on file  Occupational History  . Occupation: retired  Scientific laboratory technician  . Financial resource strain: Not on file  . Food insecurity:    Worry: Not on file    Inability: Not on file  . Transportation needs:    Medical: Not on file    Non-medical: Not on file  Tobacco Use  . Smoking status:  Never Smoker  . Smokeless tobacco: Never Used  Substance and Sexual Activity  . Alcohol use: No  . Drug use: No  . Sexual activity: Not Currently  Lifestyle  . Physical activity:    Days per week: Not on file    Minutes per session: Not on file  . Stress: Not on file  Relationships  . Social connections:    Talks on phone: Not on file    Gets together: Not on file    Attends religious service: Not on file    Active member of club or organization: Not on file    Attends meetings of clubs or organizations: Not on file    Relationship  status: Not on file  . Intimate partner violence:    Fear of current or ex partner: Not on file    Emotionally abused: Not on file    Physically abused: Not on file    Forced sexual activity: Not on file  Other Topics Concern  . Not on file  Social History Narrative  . Not on file    Family History  Problem Relation Age of Onset  . Heart disease Mother   . Dementia Mother   . Neuropathy Father   . Diabetes Brother   . Breast cancer Neg Hx      Current Outpatient Medications:  .  Alpha-Lipoic Acid 600 MG CAPS, Take by mouth., Disp: , Rfl:  .  amLODipine (NORVASC) 5 MG tablet, Take by mouth., Disp: , Rfl:  .  apixaban (ELIQUIS) 5 MG TABS tablet, Take 1 tablet (5 mg total) by mouth 2 (two) times daily., Disp: 60 tablet, Rfl: 0 .  busPIRone (BUSPAR) 15 MG tablet, Take 1 tablet by mouth 2 (two) times daily., Disp: , Rfl:  .  calcitRIOL (ROCALTROL) 0.25 MCG capsule, Take 1 capsule by mouth daily., Disp: , Rfl:  .  calcium citrate-vitamin D (CITRACAL+D) 315-200 MG-UNIT tablet, Take 1 tablet by mouth daily. , Disp: , Rfl:  .  diphenhydramine-acetaminophen (TYLENOL PM) 25-500 MG TABS, Take 2 tablets by mouth at bedtime as needed., Disp: , Rfl:  .  FLUoxetine (PROZAC) 20 MG capsule, Take 60 mg by mouth daily., Disp: , Rfl:  .  Multiple Vitamin (MULTI-VITAMINS) TABS, Take by mouth., Disp: , Rfl:  .  vitamin B-12 (CYANOCOBALAMIN) 100 MCG tablet, Take 100 mcg by mouth daily. , Disp: , Rfl:  .  ondansetron (ZOFRAN ODT) 4 MG disintegrating tablet, Take 1 tablet (4 mg total) by mouth every 8 (eight) hours as needed for nausea or vomiting. (Patient not taking: Reported on 07/13/2017), Disp: 20 tablet, Rfl: 0 .  oxyCODONE-acetaminophen (PERCOCET) 5-325 MG tablet, Take 1 tablet by mouth every 4 (four) hours as needed for severe pain. (Patient not taking: Reported on 07/13/2017), Disp: 20 tablet, Rfl: 0 .  valACYclovir (VALTREX) 1000 MG tablet, Take 1,000 mg by mouth 2 (two) times daily as needed. ,  Disp: , Rfl:   Physical exam:  Vitals:   07/13/17 1351  BP: 126/81  Pulse: 95  Resp: 18  Temp: (!) 96.9 F (36.1 C)  TempSrc: Tympanic  Weight: 145 lb 6.4 oz (66 kg)  Height: 5' 9.69" (1.77 m)   Physical Exam  Constitutional: She is oriented to person, place, and time and well-developed, well-nourished, and in no distress. No distress.  HENT:  Head: Normocephalic and atraumatic.  Mouth/Throat: No oropharyngeal exudate.  Eyes: EOM are normal. No scleral icterus.  Neck: Normal range of motion. Neck supple.  Cardiovascular:  Normal rate and regular rhythm.  No murmur heard. Pulmonary/Chest: Effort normal and breath sounds normal.  Abdominal: Soft. Bowel sounds are normal. She exhibits no distension.  Musculoskeletal: Normal range of motion.  Bilateral trace LE edema, chronic.   Lymphadenopathy:    She has no cervical adenopathy.  Neurological: She is alert and oriented to person, place, and time.  Skin: Skin is warm and dry.  Psychiatric: Affect normal.    CMP Latest Ref Rng & Units 06/23/2017  Glucose 65 - 99 mg/dL 81  BUN 6 - 20 mg/dL 26(H)  Creatinine 0.44 - 1.00 mg/dL 1.34(H)  Sodium 135 - 145 mmol/L 139  Potassium 3.5 - 5.1 mmol/L 4.1  Chloride 101 - 111 mmol/L 110  CO2 22 - 32 mmol/L 20(L)  Calcium 8.9 - 10.3 mg/dL 8.9  Total Protein 6.5 - 8.1 g/dL -  Total Bilirubin 0.3 - 1.2 mg/dL -  Alkaline Phos 38 - 126 U/L -  AST 15 - 41 U/L -  ALT 14 - 54 U/L -   CBC Latest Ref Rng & Units 06/23/2017  WBC 3.6 - 11.0 K/uL 7.2  Hemoglobin 12.0 - 16.0 g/dL 9.1(L)  Hematocrit 35.0 - 47.0 % 28.6(L)  Platelets 150 - 440 K/uL 204    Dg Chest 2 View  Result Date: 06/21/2017 CLINICAL DATA:  Shortness of breath.  Evaluate for pneumonia. EXAM: CHEST  2 VIEW COMPARISON:  None. FINDINGS: The heart size and mediastinal contours are within normal limits. Normal pulmonary vascularity. No focal consolidation, pleural effusion, or pneumothorax. No acute osseous abnormality. IMPRESSION: No  active cardiopulmonary disease. Electronically Signed   By: Titus Dubin M.D.   On: 06/21/2017 17:42   Ct Angio Chest Pe W And/or Wo Contrast  Result Date: 06/21/2017 CLINICAL DATA:  Shortness of breath for 3 days with generalized weakness. EXAM: CT ANGIOGRAPHY CHEST WITH CONTRAST TECHNIQUE: Multidetector CT imaging of the chest was performed using the standard protocol during bolus administration of intravenous contrast. Multiplanar CT image reconstructions and MIPs were obtained to evaluate the vascular anatomy. CONTRAST:  33mL ISOVUE-370 IOPAMIDOL (ISOVUE-370) INJECTION 76% COMPARISON:  None. FINDINGS: Cardiovascular: Tiny pericardial effusion noted. Heart size normal. No thoracic aortic aneurysm. Large volume pulmonary embolus identified with saddle configuration crossing the pulmonary artery bifurcation. Occlusive and nearly occlusive pulmonary embolus is seen in lobar and segmental arteries to the lower lobes and right middle lobe. Pulmonary embolus also noted in segmental branches to the left upper lobe. RV/LV ratio is 1.3. Mediastinum/Nodes: No mediastinal lymphadenopathy. There is no hilar lymphadenopathy. The esophagus has normal imaging features. There is no axillary lymphadenopathy. Lungs/Pleura: No dense focal airspace consolidation. Subtle peripheral ground-glass attenuation seen anterior upper lobes bilaterally with atelectasis noted in the lingula. No pulmonary edema or pleural effusion. No definite pulmonary infarct. 4 mm right upper lobe pulmonary nodule identified on image 43 of series 6. 3 mm right upper lobe pulmonary nodule seen on image 29. 2 mm left apical nodule visible on image 16 with other scattered tiny pulmonary nodules evident. Upper Abdomen: Surgical changes noted in the stomach. Kidneys incompletely visualized with cortical scarring noted in the left kidney. Musculoskeletal: Bone windows reveal no worrisome lytic or sclerotic osseous lesions. Review of the MIP images confirms  the above findings. IMPRESSION: 1. Large saddle pulmonary embolus with evidence of right heart strain. Positive for acute PE with CT evidence of right heart strain (RV/LV Ratio = 1.3) consistent with at least submassive (intermediate risk) PE. The presence of right heart strain has  been associated with an increased risk of morbidity and mortality. Please activate Code PE by paging [phone]. 2. Tiny bilateral pulmonary nodules measuring up to 4 mm maximum size. No follow-up needed if patient is low-risk (and has no known or suspected primary neoplasm). Non-contrast chest CT can be considered in 12 months if patient is high-risk. This recommendation follows the consensus statement: Guidelines for Management of Incidental Pulmonary Nodules Detected on CT Images: From the Fleischner Society 2017; Radiology 2017; 284:228-243. Critical Value/emergent results were called by me at the time of interpretation on 06/21/2017 at 7:10 pm to Dr. Mable Paris, who verbally acknowledged these results. Electronically Signed   By: Misty Stanley M.D.   On: 06/21/2017 19:11   US Venous Img Lower Bilateral  Result Date: 06/22/2017 CLINICAL DATA:  History of pulmonary embolism.  Evaluate for DVT. EXAM: BILATERAL LOWER EXTREMITY VENOUS DOPPLER ULTRASOUND TECHNIQUE: Gray-scale sonography with graded compression, as well as color Doppler and duplex ultrasound were performed to evaluate the lower extremity deep venous systems from the level of the common femoral vein and including the common femoral, femoral, profunda femoral, popliteal and calf veins including the posterior tibial, peroneal and gastrocnemius veins when visible. The superficial great saphenous vein was also interrogated. Spectral Doppler was utilized to evaluate flow at rest and with distal augmentation maneuvers in the common femoral, femoral and popliteal veins. COMPARISON:  None. FINDINGS: RIGHT LOWER EXTREMITY Common Femoral Vein: Note, evaluation of the right common  femoral vein is degraded secondary overlying bandages. No evidence of thrombus. Normal compressibility, respiratory phasicity and response to augmentation. Saphenofemoral Junction: No evidence of thrombus. Normal compressibility and flow on color Doppler imaging. Profunda Femoral Vein: No evidence of thrombus. Normal compressibility and flow on color Doppler imaging. Femoral Vein: No evidence of thrombus. Normal compressibility, respiratory phasicity and response to augmentation. Popliteal Vein: There is mixed echogenic near occlusive DVT seen throughout the right popliteal vein (images 19 - 25). Calf Veins: No evidence of thrombus. Normal compressibility and flow on color Doppler imaging. Superficial Great Saphenous Vein: No evidence of thrombus. Normal compressibility. Venous Reflux:  None. Other Findings:  None. LEFT LOWER EXTREMITY Common Femoral Vein: No evidence of thrombus. Normal compressibility, respiratory phasicity and response to augmentation. Saphenofemoral Junction: No evidence of thrombus. Normal compressibility and flow on color Doppler imaging. Profunda Femoral Vein: No evidence of thrombus. Normal compressibility and flow on color Doppler imaging. Femoral Vein: No evidence of thrombus. Normal compressibility, respiratory phasicity and response to augmentation. Popliteal Vein: No evidence of thrombus. Normal compressibility, respiratory phasicity and response to augmentation. Calf Veins: No evidence of thrombus. Normal compressibility and flow on color Doppler imaging. Superficial Great Saphenous Vein: No evidence of thrombus. Normal compressibility. Venous Reflux:  None. Other Findings:  None. IMPRESSION: 1. Near occlusive DVT within the right popliteal vein. 2. No evidence of DVT within the left lower extremity. Electronically Signed   By: Sandi Mariscal M.D.   On: 06/22/2017 17:06   Dg Shoulder Left  Result Date: 06/22/2017 CLINICAL DATA:  Left shoulder pain after fall 3 weeks prior. EXAM: LEFT  SHOULDER - 2+ VIEW COMPARISON:  None. FINDINGS: No fracture. No left glenohumeral dislocation. No evidence of left acromioclavicular separation. No suspicious focal osseous lesions. Mild osteoarthritis in the left glenohumeral and left acromioclavicular joints. Tiny clustered 4-5 mm densities overlying the proximal left humerus, cannot exclude loose intra-articular bodies. IMPRESSION: No fracture or dislocation. 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 Tiny clustered 4-5 mm densities overlying the proximal  left humerus, cannot exclude loose intra-articular bodies. Electronically Signed   By: Ilona Sorrel M.D.   On: 06/22/2017 12:06     Assessment and plan Patient is a 77 y.o. female with recent history of l arge saddle pulmonary embolism with evidence of right heart strain, believed to be provoked by immobilization due to fall/sacrum injury. S/p thromboectomy, present to follow up.   1. Chronic anticoagulation   2. Anemia, unspecified type   3. Acute saddle pulmonary embolism with acute cor pulmonale (HCC)    Discussed with patient that as her pulmonary embolism are considered to be provoked by immobilization due to fall/sacrum injury,, I recommend anticoagulation for 6 months.  She has a history of fall, plan discontinue anticoagulation after 6 months. She is currently at high risk of rethrombosis, recommend no interruption of anticoagulation unless life-threatening bleeding.  Recommend postponing any elective procedure. She just had lab work done with her primary care physician which showed stable hemoglobin at 10.1.  Her anemia can be secondary to chronic kidney function.  Will do anemia workup including iron panel B12 folate prior to next visit.  Earlie Server, MD, PhD Hematology Oncology [facility] Pager- [phone] 07/13/2017

## 2017-07-24 ENCOUNTER — Ambulatory Visit (INDEPENDENT_AMBULATORY_CARE_PROVIDER_SITE_OTHER): Payer: Medicare Other | Admitting: Vascular Surgery

## 2017-07-24 ENCOUNTER — Encounter (INDEPENDENT_AMBULATORY_CARE_PROVIDER_SITE_OTHER): Payer: Self-pay | Admitting: Vascular Surgery

## 2017-07-24 VITALS — BP 127/76 | HR 85 | Resp 12 | Ht 69.0 in | Wt 146.0 lb

## 2017-07-24 DIAGNOSIS — I2602 Saddle embolus of pulmonary artery with acute cor pulmonale: Secondary | ICD-10-CM | POA: Diagnosis not present

## 2017-07-24 DIAGNOSIS — I10 Essential (primary) hypertension: Secondary | ICD-10-CM | POA: Diagnosis not present

## 2017-07-24 NOTE — Assessment & Plan Note (Signed)
blood pressure control important in reducing the progression of atherosclerotic disease. On appropriate oral medications.  

## 2017-07-24 NOTE — Progress Notes (Signed)
MRN : [id number]  Ann Russell is a 77 y.o. (November 11, 1940) female who presents with chief complaint of  Chief Complaint  Patient presents with  . Follow-up    DVT f/u  .  History of Present Illness: Patient returns today in follow up.  She underwent pulmonary thrombectomy and thrombolytic therapy for submassive PE several weeks ago.  She is doing great.  She is at home with no oxygen.  She got a home oxygen saturation measurement which she says always runs 97 or 98.  She does not really have any shortness of breath.  She has been wearing compression stockings and denies any pain or swelling in her legs.  She is walking with a walker but has gotten her strength back significantly.  Current Outpatient Medications  Medication Sig Dispense Refill  . Alpha-Lipoic Acid 600 MG CAPS Take by mouth.    Marland Kitchen amLODipine (NORVASC) 5 MG tablet Take by mouth.    Marland Kitchen apixaban (ELIQUIS) 5 MG TABS tablet Take 1 tablet (5 mg total) by mouth 2 (two) times daily. 60 tablet 0  . busPIRone (BUSPAR) 15 MG tablet Take 1 tablet by mouth 2 (two) times daily.    . calcitRIOL (ROCALTROL) 0.25 MCG capsule Take 1 capsule by mouth daily.    . calcium citrate-vitamin D (CITRACAL+D) 315-200 MG-UNIT tablet Take 1 tablet by mouth daily.     . diphenhydramine-acetaminophen (TYLENOL PM) 25-500 MG TABS Take 2 tablets by mouth at bedtime as needed.    Marland Kitchen FLUoxetine (PROZAC) 20 MG capsule Take 60 mg by mouth daily.    . Multiple Vitamin (MULTI-VITAMINS) TABS Take by mouth.    . ondansetron (ZOFRAN ODT) 4 MG disintegrating tablet Take 1 tablet (4 mg total) by mouth every 8 (eight) hours as needed for nausea or vomiting. (Patient not taking: Reported on 07/13/2017) 20 tablet 0  . oxyCODONE-acetaminophen (PERCOCET) 5-325 MG tablet Take 1 tablet by mouth every 4 (four) hours as needed for severe pain. (Patient not taking: Reported on 07/13/2017) 20 tablet 0  . valACYclovir (VALTREX) 1000 MG tablet Take 1,000 mg by mouth 2 (two) times daily  as needed.     . vitamin B-12 (CYANOCOBALAMIN) 100 MCG tablet Take 100 mcg by mouth daily.      No current facility-administered medications for this visit.     Past Medical History:  Diagnosis Date  . Arthritis    hands  . 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 Chronic kidney disease    .  Takes Vasotec for kidneys.  . Depression   . Hyperlipemia   . Hypertension   . Sleep apnea     Past Surgical History:  Procedure Laterality Date  . ABDOMINAL HYSTERECTOMY    . Buniectomy     bil  . CHOLECYSTECTOMY    . FACIAL COSMETIC SURGERY  2011  . GASTRIC BYPASS  2010  . PULMONARY THROMBECTOMY Bilateral 06/22/2017   Procedure: PULMONARY THROMBECTOMY;  Surgeon: Algernon Huxley, MD;  Location: [facility];  Service: Cardiovascular;  Laterality: Bilateral;  . Rotor cuff     right  . SCLERAL BUCKLE  11/07/2011   Procedure: SCLERAL BUCKLE;  Surgeon: Hayden Pedro, MD;  Location: [facility];  Service: Ophthalmology;  Laterality: Left;    Social History Social History   Tobacco Use  . Smoking status: Never Smoker  . Smokeless tobacco: Never Used  Substance Use Topics  . Alcohol use: No  .  Drug use: No      Family History Family History  Problem Relation Age of Onset  . Heart disease Mother   . Dementia Mother   . Neuropathy Father   . Diabetes Brother   . Breast cancer Neg Hx      No Known Allergies   REVIEW OF SYSTEMS (Negative unless checked)  Constitutional: _0 Weight loss  _1 Fever  _2 Chills Cardiac: _3 Chest pain   _4 Chest pressure   _5 Palpitations   _6 Shortness of breath when laying flat   _7 Shortness of breath at rest   _8 Shortness of breath with exertion. Vascular:  _9 Pain in legs with walking   _10 Pain in legs at rest   _11 Pain in legs when laying flat   _12 Claudication   _13 Pain in feet when walking  _14 Pain in feet at rest  _15 Pain in feet when laying flat   _16 History of DVT   _17 Phlebitis   _18 Swelling in legs   _19 Varicose veins   _20 Non-healing  ulcers Pulmonary:   _21 Uses home oxygen   _22 Productive cough   _23 Hemoptysis   _24 Wheeze  _25 COPD   _26 Asthma Neurologic:  _27 Dizziness  _28 Blackouts   _29 Seizures   _30 History of stroke   _31 History of TIA  _32 Aphasia   _33 Temporary blindness   _34 Dysphagia   _35 Weakness or numbness in arms   _36 Weakness or numbness in legs Musculoskeletal:  _37 Arthritis   _38 Joint swelling   _39 Joint pain   _40 Low back pain Hematologic:  _41 Easy bruising  _42 Easy bleeding   _43 Hypercoagulable state   _44 Anemic   Gastrointestinal:  _45 Blood in stool   _46 Vomiting blood  _47 Gastroesophageal reflux/heartburn   _48 Abdominal pain Genitourinary:  _49 Chronic kidney disease   _50 Difficult urination  _51 Frequent urination  _52 Burning with urination   _53 Hematuria Skin:  _54 Rashes   _55 Ulcers   _56 Wounds Psychological:  _57 History of anxiety   _58  History of major depression.  Physical Examination  BP 127/76 (BP Location: Right Arm, Patient Position: Sitting)   Pulse 85   Resp 12   Ht _59  (1.753 m)   Wt 66.2 kg (146 lb)   BMI 21.56 kg/m  Gen:  WD/WN, NAD Head: [place]/AT, No temporalis wasting. Ear/Nose/Throat: Hearing grossly intact, nares w/o erythema or drainage Eyes: Conjunctiva clear. Sclera non-icteric Neck: Supple.  Trachea midline Pulmonary:  Good air movement, no use of accessory muscles.  Cardiac: RRR, no JVD Vascular:  Vessel Right Left  Radial Palpable Palpable                                    Musculoskeletal: M/S 5/5 throughout.  No deformity or atrophy.  Walking with a walker.  No significant lower extremity edema. Neurologic: Sensation grossly intact in extremities.  Symmetrical.  Speech is fluent.  Psychiatric: Judgment intact, Mood & affect appropriate for pt's clinical situation. Dermatologic: No rashes or ulcers noted.  No cellulitis or open wounds.       Labs Recent Results (from the past 2160 hour(s))  CBC     Status: Abnormal   Collection Time: 05/31/17 10:19 AM  Result Value Ref Range   WBC 6.2  3.6 - 11.0 K/uL   RBC 3.28 (L) 3.80 - 5.20 MIL/uL   Hemoglobin 10.5 (L) 12.0 - 16.0 g/dL   HCT 32.2 (L) 35.0 - 47.0 %   MCV 98.3 80.0 - 100.0 fL   MCH 32.0 26.0 - 34.0 pg   MCHC 32.5 32.0 - 36.0 g/dL   RDW 19.5 (  H) 11.5 - 14.5 %   Platelets 176 150 - 440 K/uL    Comment: Performed at [facility], [address] [place], [place] [address]  Basic metabolic panel     Status: Abnormal   Collection Time: 05/31/17 10:19 AM  Result Value Ref Range   Sodium 138 135 - 145 mmol/L   Potassium 3.6 3.5 - 5.1 mmol/L   Chloride 105 101 - 111 mmol/L   CO2 22 22 - 32 mmol/L   Glucose, Bld 98 65 - 99 mg/dL   BUN 37 (H) 6 - 20 mg/dL   Creatinine, Ser 1.61 (H) 0.44 - 1.00 mg/dL   Calcium 9.1 8.9 - 10.3 mg/dL   GFR calc non Af Amer 30 (L) >60 mL/min   GFR calc Af Amer 35 (L) >60 mL/min    Comment: (NOTE) The eGFR has been calculated using the CKD EPI equation. This calculation has not been validated in all clinical situations. eGFR's persistently <60 mL/min signify possible Chronic Kidney Disease.    Anion gap 11 5 - 15    Comment: Performed at [facility], [address] [place], [place] [address]  CBC with Differential/Platelet     Status: Abnormal   Collection Time: 06/21/17  5:19 PM  Result Value Ref Range   WBC 5.6 3.6 - 11.0 K/uL   RBC 4.01 3.80 - 5.20 MIL/uL   Hemoglobin 12.6 12.0 - 16.0 g/dL   HCT 38.4 35.0 - 47.0 %   MCV 95.6 80.0 - 100.0 fL   MCH 31.4 26.0 - 34.0 pg   MCHC 32.9 32.0 - 36.0 g/dL   RDW 18.0 (H) 11.5 - 14.5 %   Platelets 228 150 - 440 K/uL   Neutrophils Relative % 63 %   Neutro Abs 3.5 1.4 - 6.5 K/uL   Lymphocytes Relative 26 %   Lymphs Abs 1.4 1.0 - 3.6 K/uL   Monocytes Relative 9 %   Monocytes Absolute 0.5 0.2 - 0.9 K/uL   Eosinophils Relative 1 %   Eosinophils Absolute 0.1 0 - 0.7 K/uL   Basophils Relative 1 %   Basophils Absolute 0.1 0 - 0.1 K/uL    Comment: Performed at [facility], [address] [place], [place]  [address]  Comprehensive metabolic panel     Status: Abnormal   Collection Time: 06/21/17  5:19 PM  Result Value Ref Range   Sodium 139 135 - 145 mmol/L   Potassium 4.5 3.5 - 5.1 mmol/L   Chloride 109 101 - 111 mmol/L   CO2 21 (L) 22 - 32 mmol/L   Glucose, Bld 99 65 - 99 mg/dL   BUN 32 (H) 6 - 20 mg/dL   Creatinine, Ser 1.41 (H) 0.44 - 1.00 mg/dL   Calcium 9.6 8.9 - 10.3 mg/dL   Total Protein 6.8 6.5 - 8.1 g/dL   Albumin 3.5 3.5 - 5.0 g/dL   AST 30 15 - 41 U/L   ALT 29 14 - 54 U/L   Alkaline Phosphatase 110 38 - 126 U/L   Total Bilirubin 0.8 0.3 - 1.2 mg/dL   GFR calc non Af Amer 35 (L) >60 mL/min   GFR calc Af Amer 41 (L) >60 mL/min    Comment: (NOTE) The eGFR has been calculated using the CKD EPI equation. This calculation has not been validated in all clinical situations. eGFR's persistently <60 mL/min signify possible Chronic Kidney Disease.    Anion gap 9 5 - 15    Comment: Performed  at [facility], [address] [place], [place] [address]  Troponin I     Status: Abnormal   Collection Time: 06/21/17  5:19 PM  Result Value Ref Range   Troponin I 0.03 (HH) <0.03 ng/mL    Comment: CRITICAL RESULT CALLED TO, READ BACK BY AND VERIFIED WITH DAJEA SCOTT AT 1817 06/21/2017 BY TFK. Performed at [facility], [address] [place], [place] [address]   Brain natriuretic peptide     Status: Abnormal   Collection Time: 06/21/17  5:19 PM  Result Value Ref Range   B Natriuretic Peptide 1,235.0 (H) 0.0 - 100.0 pg/mL    Comment: Performed at [facility], [address] [place], [place] [address]  APTT     Status: Abnormal   Collection Time: 06/21/17 10:19 PM  Result Value Ref Range   aPTT 157 (H) 24 - 36 seconds    Comment:        IF BASELINE aPTT IS ELEVATED, SUGGEST PATIENT RISK ASSESSMENT BE USED TO DETERMINE APPROPRIATE ANTICOAGULANT THERAPY. Performed at [facility], [address] [place], [place] [address]   Protime-INR      Status: None   Collection Time: 06/21/17 10:19 PM  Result Value Ref Range   Prothrombin Time 15.0 11.4 - 15.2 seconds   INR 1.19     Comment: Performed at [facility], [place], [place] [address]  Heparin level (unfractionated)     Status: Abnormal   Collection Time: 06/22/17  1:59 AM  Result Value Ref Range   Heparin Unfractionated 0.82 (H) 0.30 - 0.70 IU/mL    Comment:        IF HEPARIN RESULTS ARE BELOW EXPECTED VALUES, AND PATIENT DOSAGE HAS BEEN CONFIRMED, SUGGEST FOLLOW UP TESTING OF ANTITHROMBIN III LEVELS. Performed at [facility], [address] [place], [place] [address]   Basic metabolic panel     Status: Abnormal   Collection Time: 06/22/17  1:59 AM  Result Value Ref Range   Sodium 139 135 - 145 mmol/L   Potassium 4.1 3.5 - 5.1 mmol/L   Chloride 113 (H) 101 - 111 mmol/L   CO2 19 (L) 22 - 32 mmol/L   Glucose, Bld 100 (H) 65 - 99 mg/dL   BUN 31 (H) 6 - 20 mg/dL   Creatinine, Ser 1.34 (H) 0.44 - 1.00 mg/dL   Calcium 8.8 (L) 8.9 - 10.3 mg/dL   GFR calc non Af Amer 38 (L) >60 mL/min   GFR calc Af Amer 44 (L) >60 mL/min    Comment: (NOTE) The eGFR has been calculated using the CKD EPI equation. This calculation has not been validated in all clinical situations. eGFR's persistently <60 mL/min signify possible Chronic Kidney Disease.    Anion gap 7 5 - 15    Comment: Performed at [facility], [address] [place], [place] [address]  CBC     Status: Abnormal   Collection Time: 06/22/17  1:59 AM  Result Value Ref Range   WBC 8.2 3.6 - 11.0 K/uL   RBC 3.50 (L) 3.80 - 5.20 MIL/uL   Hemoglobin 11.1 (L) 12.0 - 16.0 g/dL   HCT 33.0 (L) 35.0 - 47.0 %   MCV 94.5 80.0 - 100.0 fL   MCH 31.6 26.0 - 34.0 pg   MCHC 33.5 32.0 - 36.0 g/dL   RDW 18.0 (H) 11.5 - 14.5 %   Platelets 206 150 - 440 K/uL    Comment: Performed  at [facility], [place], [place] [address]  Heparin level (unfractionated)      Status: None   Collection Time: 06/22/17 11:06 AM  Result Value Ref Range   Heparin Unfractionated 0.55 0.30 - 0.70 IU/mL    Comment:        IF HEPARIN RESULTS ARE BELOW EXPECTED VALUES, AND PATIENT DOSAGE HAS BEEN CONFIRMED, SUGGEST FOLLOW UP TESTING OF ANTITHROMBIN III LEVELS. Performed at [facility], [place], [place] [address]   Heparin level (unfractionated)     Status: Abnormal   Collection Time: 06/22/17  7:08 PM  Result Value Ref Range   Heparin Unfractionated 1.08 (H) 0.30 - 0.70 IU/mL    Comment:        IF HEPARIN RESULTS ARE BELOW EXPECTED VALUES, AND PATIENT DOSAGE HAS BEEN CONFIRMED, SUGGEST FOLLOW UP TESTING OF ANTITHROMBIN III LEVELS. Performed at [facility], [place], [place] [address]   Heparin level (unfractionated)     Status: Abnormal   Collection Time: 06/22/17  8:43 PM  Result Value Ref Range   Heparin Unfractionated 1.00 (H) 0.30 - 0.70 IU/mL    Comment:        IF HEPARIN RESULTS ARE BELOW EXPECTED VALUES, AND PATIENT DOSAGE HAS BEEN CONFIRMED, SUGGEST FOLLOW UP TESTING OF ANTITHROMBIN III LEVELS. Performed at [facility], [address] [place], [place] [address]   CBC     Status: Abnormal   Collection Time: 06/23/17  6:42 AM  Result Value Ref Range   WBC 7.2 3.6 - 11.0 K/uL   RBC 2.97 (L) 3.80 - 5.20 MIL/uL   Hemoglobin 9.1 (L) 12.0 - 16.0 g/dL   HCT 28.6 (L) 35.0 - 47.0 %   MCV 96.3 80.0 - 100.0 fL   MCH 30.6 26.0 - 34.0 pg   MCHC 31.8 (L) 32.0 - 36.0 g/dL   RDW 17.9 (H) 11.5 - 14.5 %   Platelets 204 150 - 440 K/uL    Comment: Performed at [facility], [address] [place], [place] [address]  Basic metabolic panel     Status: Abnormal   Collection Time: 06/23/17  6:42 AM  Result Value Ref Range   Sodium 139 135 - 145 mmol/L   Potassium 4.1 3.5 - 5.1 mmol/L   Chloride 110 101 - 111 mmol/L   CO2 20 (L) 22 - 32 mmol/L   Glucose, Bld 81 65 - 99 mg/dL   BUN 26  (H) 6 - 20 mg/dL   Creatinine, Ser 1.34 (H) 0.44 - 1.00 mg/dL   Calcium 8.9 8.9 - 10.3 mg/dL   GFR calc non Af Amer 38 (L) >60 mL/min   GFR calc Af Amer 44 (L) >60 mL/min    Comment: (NOTE) The eGFR has been calculated using the CKD EPI equation. This calculation has not been validated in all clinical situations. eGFR's persistently <60 mL/min signify possible Chronic Kidney Disease.    Anion gap 9 5 - 15    Comment: Performed at [facility], [address] [place], [place] [address]    Radiology No results found.  Assessment/Plan  Hypertension blood pressure control important in reducing the progression of atherosclerotic disease. On appropriate oral medications.   Pulmonary embolism [facility]) The patient is doing quite well status post thrombolytic therapy and thrombectomy for submassive pulmonary embolus.  She remains on anticoagulation and should do so for at least 6 months.  I will be happy to see her  back around that time to discuss cessation of anticoagulation and transition to aspirin therapy.  She can resume all normal activities as tolerated.    Leotis Pain, MD  07/24/2017 4:44 PM    This note was created with Dragon medical transcription system.  Any errors from dictation are purely unintentional

## 2017-07-24 NOTE — Assessment & Plan Note (Signed)
The patient is doing quite well status post thrombolytic therapy and thrombectomy for submassive pulmonary embolus.  She remains on anticoagulation and should do so for at least 6 months.  I will be happy to see her back around that time to discuss cessation of anticoagulation and transition to aspirin therapy.  She can resume all normal activities as tolerated.

## 2017-07-24 NOTE — Patient Instructions (Signed)
Pulmonary Embolism A pulmonary embolism (PE) is a sudden blockage or decrease of blood flow in one lung or both lungs. Most blockages come from a blood clot that forms in a lower leg, thigh, or arm vein (deep vein thrombosis, DVT) and travels to the lungs. A clot is blood that has thickened into a gel or solid. PE is a dangerous and life-threatening condition that needs to be treated right away. What are the causes? This condition is usually caused by a blood clot that forms in a vein and moves to the lungs. In rare cases, it may be caused by air, fat, part of a tumor, or other tissue that moves through the veins and into the lungs. What increases the risk? The following factors may make you more likely to develop this condition:  Having DVT or a history of DVT.  Being older than age 21.  Personal or family history of blood clots or blood clotting disease.  Major or lengthy surgery.  Orthopedic surgery, especially hip or knee replacement.  Traumatic injury, such as breaking a hip or leg.  Spinal cord injury.  Stroke.  Taking medicines that contain estrogen. These include birth control pills and hormone replacement therapy.  Long-term (chronic) lung or heart disease.  Cancer and chemotherapy.  Having a central venous catheter.  Pregnancy and the period after delivery.  What are the signs or symptoms? Symptoms of this condition usually start suddenly and include:  Shortness of breath while active or at rest.  Coughing or coughing up blood or blood-tinged mucus.  Chest pain that is often worse with deep breaths.  Rapid or irregular heartbeat.  Feeling light-headed or dizzy.  Fainting.  Feeling anxious.  Sweating.  Pain and swelling in a leg. This is a symptom of DVT, which can lead to PE.  How is this diagnosed? This condition may be diagnosed based on:  Your medical history.  A physical exam.  Blood tests to check blood oxygen level and how well your blood  clots, and a D-dimer blood test, which checks your blood for a substance that is released when a blood clot breaks apart.  CT pulmonary angiogram. This test checks blood flow in and around your lungs.  Ventilation-perfusion scan, also called a lung VQ scan. This test measures air flow and blood flow to the lungs.  Ultrasound of the legs to look for blood clots.  How is this treated? Treatment for this conditions depends on many factors, such as the cause of your PE, your risk for bleeding or developing more clots, and other medical conditions you have. Treatment aims to remove, dissolve, or stop blood clots from forming or growing larger. Treatment may include:  Blood thinning medicines (anticoagulants) to stop clots from forming or growing. These medicines may be given as a pill, as an injection, or through an IV tube (infusion).  Medicines that dissolve clots (thrombolytics).  A procedure in which a flexible tube is used to remove a blood clot (embolectomy) or deliver medicine to destroy it (catheter-directed thrombolysis).  A procedure in which a filter is inserted into a large vein that carries blood to the heart (inferior vena cava). This filter (vena cava filter) catches blood clots before they reach the lungs.  Surgery to remove the clot (surgical embolectomy). This is rare.  You may need a combination of immediate, long-term (up to 3 months after diagnosis), and extended (more than 3 months after diagnosis) treatments. Your treatment may continue for several months (maintenance therapy).  You and your health care provider will work together to choose the treatment program that is best for you. Follow these instructions at home: If you are taking an anticoagulant medicine:  Take the medicine every day at the same time each day.  Understand what foods and drugs interact with your medicine.  Understand the side effects of this medicine, including excessive bruising or bleeding. Ask  your health care provider or pharmacist about other side effects. General instructions  Take over-the-counter and prescription medicines only as told by your health care provider.  Anticoagulant medicines may cause side effects, including easy bruising and difficulty stopping bleeding. If you are prescribed an anticoagulant: ? Hold pressure over cuts for longer than usual. ? Tell your dentist and other health care providers that you are taking anticoagulants before you have any procedure that may cause bleeding. ? Avoid contact sports. ? Be extra careful when handling sharp objects. ? Use a soft toothbrush. Floss with waxed dental floss. ? Shave with an Copy.  Wear a medical alert bracelet or carry a medical alert card that says you have had a PE.  Ask your health care provider when you may return to your normal activities.  Talk with your health care provider about any travel plans. It is important to make sure that you are still able to take your medicine while on trips.  Keep all follow-up visits as told by your health care provider. This is important. How is this prevented? Take these actions to lower your risk of developing another PE:  Exercise regularly. Take frequent walks. For at least 30 minutes every day, engage in: ? Activity that involves moving your arms and legs. ? Activity that encourages good blood flow through your body by increasing your heart rate.  While traveling, drink plenty of water and avoid drinking alcohol. Ask your health care provider if you should wear below-the-knee compression stockings.  Avoid sitting or lying in bed for long periods of time without moving your legs. Exercise your arms and legs every hour during long-distance travel (over 4 hours).  If you are hospitalized or have surgery, ask your health care provider about your risks and what treatments can help prevent blood clots.  Maintain a healthy weight. Ask your health care  provider what weight is healthy for you.  If you are a woman who is over age 63, avoid unnecessary use of medicines that contain estrogen, including birth control pills.  Do not use any products that contain nicotine or tobacco, such as cigarettes and e-cigarettes. This is especially important if you take estrogen medicines. If you need help quitting, ask your health care provider.  See your health care provider for regular checkups. This may include blood tests and ultrasound testing on your legs to check for new blood clots.  Contact a health care provider if:  You missed a dose of your blood thinner medicine. Get help right away if:  You have new or increased pain, swelling, warmth, or redness in an arm or leg.  You have numbness or tingling in an arm or leg.  You have shortness of breath while active or at rest.  You have chest pain.  You have a rapid or irregular heartbeat.  You feel light-headed or dizzy.  You cough up blood.  You have blood in your vomit, stool, or urine.  You have a fever.  You have abdomen (abdominal) pain.  You have a severe fall or head injury.  You have a  severe headache.  You have vision changes.  You cannot move your arms or legs.  You are confused or have memory loss.  You are bleeding for 10 minutes or more, even with strong pressure on the wound. These symptoms may represent a serious problem that is an emergency. Do not wait to see if the symptoms will go away. Get medical help right away. Call your local emergency services (911 in the [place] Do not drive yourself to the hospital. Summary  A pulmonary embolism (PE) is a sudden blockage or decrease of blood flow in one lung or both lungs. PE is a dangerous and life-threatening condition that needs to be treated right away.  Having deep vein thrombosis (DVT) or a history of DVT is the most common risk factor for PE.  Treatments for this condition usually include medicines to thin  your blood (anticoagulants) or medicines to break apart blood clots (thrombolytics).  If you are prescribed blood thinners, it is important to take the medicine every single day at the same time each day.  If you have signs of PE or DVT, call your local emergency services (911 in the [place] This information is not intended to replace advice given to you by your health care provider. Make sure you discuss any questions you have with your health care provider. Document Released: 04/07/2000 Document Revised: 05/13/2016 Document Reviewed: 05/13/2016 Elsevier Interactive Patient Education  2018 [employer].

## 2017-07-25 ENCOUNTER — Ambulatory Visit: Payer: Medicare Other | Admitting: Oncology

## 2017-07-27 ENCOUNTER — Emergency Department: Payer: Medicare Other

## 2017-07-27 ENCOUNTER — Encounter: Payer: Self-pay | Admitting: Emergency Medicine

## 2017-07-27 ENCOUNTER — Other Ambulatory Visit: Payer: Self-pay

## 2017-07-27 ENCOUNTER — Observation Stay
Admission: EM | Admit: 2017-07-27 | Discharge: 2017-07-28 | Disposition: A | Discharge status: Home / Self Care | Payer: Medicare Other | Attending: Internal Medicine | Admitting: Internal Medicine

## 2017-07-27 DIAGNOSIS — M79605 Pain in left leg: Secondary | ICD-10-CM | POA: Insufficient documentation

## 2017-07-27 DIAGNOSIS — M65812 Other synovitis and tenosynovitis, left shoulder: Secondary | ICD-10-CM | POA: Diagnosis not present

## 2017-07-27 DIAGNOSIS — M19012 Primary osteoarthritis, left shoulder: Secondary | ICD-10-CM | POA: Diagnosis not present

## 2017-07-27 DIAGNOSIS — Z7901 Long term (current) use of anticoagulants: Secondary | ICD-10-CM | POA: Diagnosis not present

## 2017-07-27 DIAGNOSIS — Z86711 Personal history of pulmonary embolism: Secondary | ICD-10-CM | POA: Insufficient documentation

## 2017-07-27 DIAGNOSIS — E785 Hyperlipidemia, unspecified: Secondary | ICD-10-CM | POA: Diagnosis not present

## 2017-07-27 DIAGNOSIS — M25512 Pain in left shoulder: Secondary | ICD-10-CM

## 2017-07-27 DIAGNOSIS — G4730 Sleep apnea, unspecified: Secondary | ICD-10-CM | POA: Insufficient documentation

## 2017-07-27 DIAGNOSIS — M25519 Pain in unspecified shoulder: Secondary | ICD-10-CM | POA: Diagnosis present

## 2017-07-27 DIAGNOSIS — Y92009 Unspecified place in unspecified non-institutional (private) residence as the place of occurrence of the external cause: Secondary | ICD-10-CM | POA: Diagnosis not present

## 2017-07-27 DIAGNOSIS — Z9884 Bariatric surgery status: Secondary | ICD-10-CM | POA: Insufficient documentation

## 2017-07-27 DIAGNOSIS — M19042 Primary osteoarthritis, left hand: Secondary | ICD-10-CM | POA: Diagnosis not present

## 2017-07-27 DIAGNOSIS — Z79899 Other long term (current) drug therapy: Secondary | ICD-10-CM | POA: Insufficient documentation

## 2017-07-27 DIAGNOSIS — I129 Hypertensive chronic kidney disease with stage 1 through stage 4 chronic kidney disease, or unspecified chronic kidney disease: Secondary | ICD-10-CM | POA: Diagnosis not present

## 2017-07-27 DIAGNOSIS — S46112A Strain of muscle, fascia and tendon of long head of biceps, left arm, initial encounter: Secondary | ICD-10-CM | POA: Insufficient documentation

## 2017-07-27 DIAGNOSIS — S46012A Strain of muscle(s) and tendon(s) of the rotator cuff of left shoulder, initial encounter: Principal | ICD-10-CM | POA: Insufficient documentation

## 2017-07-27 DIAGNOSIS — Z85828 Personal history of other malignant neoplasm of skin: Secondary | ICD-10-CM | POA: Insufficient documentation

## 2017-07-27 DIAGNOSIS — M25561 Pain in right knee: Secondary | ICD-10-CM | POA: Insufficient documentation

## 2017-07-27 DIAGNOSIS — Z8249 Family history of ischemic heart disease and other diseases of the circulatory system: Secondary | ICD-10-CM | POA: Diagnosis not present

## 2017-07-27 DIAGNOSIS — M19041 Primary osteoarthritis, right hand: Secondary | ICD-10-CM | POA: Insufficient documentation

## 2017-07-27 DIAGNOSIS — R296 Repeated falls: Secondary | ICD-10-CM | POA: Diagnosis not present

## 2017-07-27 DIAGNOSIS — W010XXA Fall on same level from slipping, tripping and stumbling without subsequent striking against object, initial encounter: Secondary | ICD-10-CM | POA: Diagnosis not present

## 2017-07-27 DIAGNOSIS — F329 Major depressive disorder, single episode, unspecified: Secondary | ICD-10-CM | POA: Diagnosis not present

## 2017-07-27 DIAGNOSIS — N182 Chronic kidney disease, stage 2 (mild): Secondary | ICD-10-CM | POA: Insufficient documentation

## 2017-07-27 HISTORY — DX: Other pulmonary embolism without acute cor pulmonale: I26.99

## 2017-07-27 LAB — COMPREHENSIVE METABOLIC PANEL
ALK PHOS: 34 U/L — AB (ref 38–126)
ALT: 17 U/L (ref 14–54)
AST: 30 U/L (ref 15–41)
Albumin: 3.4 g/dL — ABNORMAL LOW (ref 3.5–5.0)
Anion gap: 8 (ref 5–15)
BILIRUBIN TOTAL: 0.5 mg/dL (ref 0.3–1.2)
BUN: 29 mg/dL — AB (ref 6–20)
CO2: 22 mmol/L (ref 22–32)
CREATININE: 1.13 mg/dL — AB (ref 0.44–1.00)
Calcium: 9 mg/dL (ref 8.9–10.3)
Chloride: 107 mmol/L (ref 101–111)
GFR calc Af Amer: 53 mL/min — ABNORMAL LOW (ref >60)
GFR, EST NON AFRICAN AMERICAN: 46 mL/min — AB (ref >60)
Glucose, Bld: 95 mg/dL (ref 65–99)
Potassium: 3.8 mmol/L (ref 3.5–5.1)
Sodium: 137 mmol/L (ref 135–145)
TOTAL PROTEIN: 6.3 g/dL — AB (ref 6.5–8.1)

## 2017-07-27 LAB — CBC WITH DIFFERENTIAL/PLATELET
Basophils Absolute: 0.1 10*3/uL (ref 0–0.1)
Basophils Relative: 1 %
EOS ABS: 0.1 10*3/uL (ref 0–0.7)
EOS PCT: 2 %
HCT: 28.1 % — ABNORMAL LOW (ref 35.0–47.0)
Hemoglobin: 9.6 g/dL — ABNORMAL LOW (ref 12.0–16.0)
Lymphocytes Relative: 29 %
Lymphs Abs: 1.7 10*3/uL (ref 1.0–3.6)
MCH: 31.1 pg (ref 26.0–34.0)
MCHC: 34.2 g/dL (ref 32.0–36.0)
MCV: 90.9 fL (ref 80.0–100.0)
Monocytes Absolute: 0.5 10*3/uL (ref 0.2–0.9)
Monocytes Relative: 8 %
Neutro Abs: 3.5 10*3/uL (ref 1.4–6.5)
Neutrophils Relative %: 60 %
PLATELETS: 294 10*3/uL (ref 150–440)
RBC: 3.09 MIL/uL — AB (ref 3.80–5.20)
RDW: 16.4 % — ABNORMAL HIGH (ref 11.5–14.5)
WBC: 5.7 10*3/uL (ref 3.6–11.0)

## 2017-07-27 LAB — TYPE AND SCREEN
ABO/RH(D): A POS
Antibody Screen: NEGATIVE

## 2017-07-27 MED ORDER — HYDROCODONE-ACETAMINOPHEN 5-325 MG PO TABS: 1.0000 | ORAL_TABLET | ORAL | Status: DC | PRN

## 2017-07-27 MED ORDER — DIPHENHYDRAMINE HCL 25 MG PO CAPS: 50.0000 mg | ORAL_CAPSULE | Freq: Every evening | ORAL | Status: DC | PRN

## 2017-07-27 MED ORDER — POLYETHYLENE GLYCOL 3350 17 G PO PACK
17.0000 g | PACK | Freq: Every day | ORAL | Status: DC | PRN
  Administered 2017-07-28: 17 g via ORAL
  Filled 2017-07-27: qty 1

## 2017-07-27 MED ORDER — ACETAMINOPHEN 500 MG PO TABS: 1000.0000 mg | ORAL_TABLET | Freq: Every evening | ORAL | Status: DC | PRN

## 2017-07-27 MED ORDER — DIPHENHYDRAMINE-APAP (SLEEP) 25-500 MG PO TABS: 2.0000 | ORAL_TABLET | Freq: Every evening | ORAL | Status: DC | PRN

## 2017-07-27 MED ORDER — KETOROLAC TROMETHAMINE 15 MG/ML IJ SOLN
15.0000 mg | Freq: Four times a day (QID) | INTRAMUSCULAR | Status: DC | PRN
  Filled 2017-07-27: qty 1

## 2017-07-27 MED ORDER — CALCITRIOL 0.25 MCG PO CAPS
0.2500 ug | ORAL_CAPSULE | Freq: Every day | ORAL | Status: DC
  Administered 2017-07-28: 0.25 ug via ORAL
  Filled 2017-07-27 (×2): qty 1

## 2017-07-27 MED ORDER — OXYCODONE-ACETAMINOPHEN 5-325 MG PO TABS
1.0000 | ORAL_TABLET | ORAL | Status: DC | PRN
Start: 2017-07-27 — End: 2017-07-28
  Administered 2017-07-27 – 2017-07-28 (×5): 1 via ORAL
  Filled 2017-07-27 (×5): qty 1

## 2017-07-27 MED ORDER — AMLODIPINE BESYLATE 5 MG PO TABS
5.0000 mg | ORAL_TABLET | Freq: Every day | ORAL | Status: DC
  Administered 2017-07-27 – 2017-07-28 (×2): 5 mg via ORAL
  Filled 2017-07-27 (×2): qty 1

## 2017-07-27 MED ORDER — OXYCODONE-ACETAMINOPHEN 5-325 MG PO TABS
1.0000 | ORAL_TABLET | Freq: Once | ORAL | Status: AC
  Administered 2017-07-27: 1 via ORAL
  Filled 2017-07-27: qty 1

## 2017-07-27 MED ORDER — CALCIUM CARBONATE-VITAMIN D 500-200 MG-UNIT PO TABS
1.0000 | ORAL_TABLET | Freq: Every day | ORAL | Status: DC
  Administered 2017-07-28: 08:00:00 1 via ORAL
  Filled 2017-07-27 (×2): qty 1

## 2017-07-27 MED ORDER — VALACYCLOVIR HCL 500 MG PO TABS
ORAL_TABLET | ORAL | Status: AC
  Filled 2017-07-27: qty 2

## 2017-07-27 MED ORDER — FENTANYL CITRATE (PF) 100 MCG/2ML IJ SOLN
50.0000 ug | Freq: Once | INTRAMUSCULAR | Status: AC
  Administered 2017-07-27: 50 ug via INTRAVENOUS
  Filled 2017-07-27: qty 2

## 2017-07-27 MED ORDER — KETOROLAC TROMETHAMINE 30 MG/ML IJ SOLN
15.0000 mg | Freq: Once | INTRAMUSCULAR | Status: DC
  Filled 2017-07-27: qty 1

## 2017-07-27 MED ORDER — ENOXAPARIN SODIUM 40 MG/0.4ML ~~LOC~~ SOLN: 40.0000 mg | SUBCUTANEOUS | Status: DC

## 2017-07-27 MED ORDER — BUSPIRONE HCL 5 MG PO TABS
15.0000 mg | ORAL_TABLET | Freq: Two times a day (BID) | ORAL | Status: DC
  Administered 2017-07-28: 15 mg via ORAL
  Filled 2017-07-27 (×3): qty 3

## 2017-07-27 MED ORDER — ACETAMINOPHEN 325 MG PO TABS: 650.0000 mg | ORAL_TABLET | Freq: Four times a day (QID) | ORAL | Status: DC | PRN

## 2017-07-27 MED ORDER — VALACYCLOVIR HCL 500 MG PO TABS
1000.0000 mg | ORAL_TABLET | Freq: Two times a day (BID) | ORAL | Status: DC | PRN
  Filled 2017-07-27: qty 2

## 2017-07-27 MED ORDER — ONDANSETRON HCL 4 MG PO TABS: 4.0000 mg | ORAL_TABLET | Freq: Four times a day (QID) | ORAL | Status: DC | PRN

## 2017-07-27 MED ORDER — ONDANSETRON HCL 4 MG/2ML IJ SOLN: 4.0000 mg | Freq: Four times a day (QID) | INTRAMUSCULAR | Status: DC | PRN

## 2017-07-27 MED ORDER — FLUOXETINE HCL 20 MG PO CAPS
60.0000 mg | ORAL_CAPSULE | Freq: Every day | ORAL | Status: DC
  Administered 2017-07-28: 60 mg via ORAL
  Filled 2017-07-27: qty 3

## 2017-07-27 MED ORDER — APIXABAN 5 MG PO TABS
5.0000 mg | ORAL_TABLET | Freq: Two times a day (BID) | ORAL | Status: DC
  Administered 2017-07-27 – 2017-07-28 (×2): 5 mg via ORAL
  Filled 2017-07-27 (×2): qty 1

## 2017-07-27 MED ORDER — VITAMIN B-12 100 MCG PO TABS
100.0000 ug | ORAL_TABLET | Freq: Every day | ORAL | Status: DC
  Administered 2017-07-28: 100 ug via ORAL
  Filled 2017-07-27 (×3): qty 1

## 2017-07-27 MED ORDER — ACETAMINOPHEN 650 MG RE SUPP: 650.0000 mg | Freq: Four times a day (QID) | RECTAL | Status: DC | PRN

## 2017-07-27 NOTE — ED Notes (Addendum)
Attempted to call report. Instructed to try back in 5 mins.

## 2017-07-27 NOTE — Progress Notes (Signed)
Family Meeting Note  Advance Directive:yes  Today a meeting took place with the pt and dter  The following were discussed:Patient's diagnosis: patient came in with mechanical fall at home. Sustained left shoulder injury. With pain. No fracture., Patient's progosis: stable  discuss code status with patient and daughter. Rec request full code   Time spent during discussion 16 mins Fritzi Mandes, MD

## 2017-07-27 NOTE — ED Triage Notes (Addendum)
Pt arrives via [facility] s/p mechanical fall while using walker approx 1 hour ago. Pt reports falling onto her left shoulder and hitting left side of head. Denies being dizzy prior to fall. 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   Pt taking eliquis s/p PE x 2 weeks ago. Pt had thrombectomy 3/1.

## 2017-07-27 NOTE — ED Notes (Signed)
ED Provider at bedside. 

## 2017-07-27 NOTE — Progress Notes (Signed)
Pt complains of 7/10 left shoulder pain. PRN med not due for 2 more hours. MD notified. One time dose given. I will continue to assess.

## 2017-07-27 NOTE — H&P (Signed)
[facility] NAME: Mackie Goon    MR#:  [id number]  DATE OF BIRTH:  [birth year]  DATE OF ADMISSION:  07/27/2017  PRIMARY CARE PHYSICIAN: Idelle Crouch, MD   REQUESTING/REFERRING PHYSICIAN: Dr. Mable Paris  CHIEF COMPLAINT:   Mechanical fall at home left shoulder pain HISTORY OF PRESENT ILLNESS:  Pairlee Sawtell  is a 77 y.o. 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 Patient says she usually uses a walker. She later tried to look behind and take a step loss balance and fell regarding her left shoulder which has rotator cuff injury. Her pain is excruciating. She got 200 g of fentanyl. She still complains of pain unable to move her shoulder. She will be admitted for pain control for left shoulder pain.  PAST MEDICAL HISTORY:   Past Medical History:  Diagnosis Date  . Arthritis    hands  . 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 Chronic kidney disease    .  Takes Vasotec for kidneys.  . Depression   . Hyperlipemia   . Hypertension   . Pulmonary embolism ([place])   . Sleep apnea     PAST SURGICAL HISTOIRY:   Past Surgical History:  Procedure Laterality Date  . ABDOMINAL HYSTERECTOMY    . Buniectomy     bil  . CHOLECYSTECTOMY    . FACIAL COSMETIC SURGERY  2011  . GASTRIC BYPASS  2010  . PULMONARY THROMBECTOMY Bilateral 06/22/2017   Procedure: PULMONARY THROMBECTOMY;  Surgeon: Algernon Huxley, MD;  Location: [facility];  Service: Cardiovascular;  Laterality: Bilateral;  . Rotor cuff     right  . SCLERAL BUCKLE  11/07/2011   Procedure: SCLERAL BUCKLE;  Surgeon: Hayden Pedro, MD;  Location: [facility];  Service: Ophthalmology;  Laterality: Left;    SOCIAL HISTORY:   Social History   Tobacco Use  . Smoking status: Never  Smoker  . Smokeless tobacco: Never Used  Substance Use Topics  . Alcohol use: No    FAMILY HISTORY:   Family History  Problem Relation Age of Onset  . Heart disease Mother   . Dementia Mother   . Neuropathy Father   . Diabetes Brother   . Breast cancer Neg Hx     DRUG ALLERGIES:  No Known Allergies  REVIEW OF SYSTEMS:  Review of Systems  Constitutional: Negative for chills, fever and weight loss.  HENT: Negative for ear discharge, ear pain and nosebleeds.   Eyes: Negative for blurred vision, pain and discharge.  Respiratory: Negative for sputum production, shortness of breath, wheezing and stridor.   Cardiovascular: Negative for chest pain, palpitations, orthopnea and PND.  Gastrointestinal: Negative for abdominal pain, diarrhea, nausea and vomiting.  Genitourinary: Negative for frequency and urgency.  Musculoskeletal: Positive for falls, joint pain and neck pain. Negative for back pain.  Neurological: Positive for weakness. Negative for sensory change, speech change and focal weakness.  Psychiatric/Behavioral: Negative for depression and hallucinations. The patient is not nervous/anxious.      MEDICATIONS AT HOME:   Prior to Admission medications   Medication Sig Start Date End Date Taking? Authorizing Provider  Alpha-Lipoic Acid 600 MG CAPS Take by mouth. 09/29/16  Yes [provider]  amLODipine (NORVASC) 5 MG tablet Take 5 mg  by mouth daily.  12/21/16 12/21/17 Yes [provider]  apixaban (ELIQUIS) 5 MG TABS tablet Take 1 tablet (5 mg total) by mouth 2 (two) times daily. 06/23/17  Yes Mody, Ulice Bold, MD  busPIRone (BUSPAR) 15 MG tablet Take 1 tablet by mouth 2 (two) times daily. 06/06/17  Yes [provider]  calcitRIOL (ROCALTROL) 0.25 MCG capsule Take 1 capsule by mouth daily. 06/16/17  Yes [provider]  calcium citrate-vitamin D (CITRACAL+D) 315-200 MG-UNIT tablet Take 1 tablet by mouth daily.    Yes [provider]   diphenhydramine-acetaminophen (TYLENOL PM) 25-500 MG TABS Take 2 tablets by mouth at bedtime as needed.   Yes [provider]  FLUoxetine (PROZAC) 20 MG capsule Take 60 mg by mouth daily. 05/20/17  Yes [provider]  Multiple Vitamin (MULTI-VITAMINS) TABS Take by mouth.   Yes [provider]  valACYclovir (VALTREX) 1000 MG tablet Take 1,000 mg by mouth 2 (two) times daily as needed.    Yes [provider]  vitamin B-12 (CYANOCOBALAMIN) 100 MCG tablet Take 100 mcg by mouth daily.    Yes [provider]  oxyCODONE-acetaminophen (PERCOCET) 5-325 MG tablet Take 1 tablet by mouth every 4 (four) hours as needed for severe pain. Patient not taking: Reported on 07/27/2017 05/31/17   Harvest Dark, MD      VITAL SIGNS:  Blood pressure (!) 142/75, pulse 70, temperature 98 F (36.7 C), temperature source Oral, resp. rate 20, height 5\' 8"  (1.727 m), weight 65.8 kg (145 lb), SpO2 100 %.  PHYSICAL EXAMINATION:  GENERAL:  77 y.o.-year-old patient lying in the bed with no acute distress.  EYES: Pupils equal, round, reactive to light and accommodation. No scleral icterus. Extraocular muscles intact.  HEENT: Head atraumatic, normocephalic. Oropharynx and nasopharynx clear.  NECK:  Supple, no jugular venous distention. No thyroid enlargement, no tenderness.  LUNGS: Normal breath sounds bilaterally, no wheezing, rales,rhonchi or crepitation. No use of accessory muscles of respiration.  CARDIOVASCULAR: S1, S2 normal. No murmurs, rubs, or gallops.  ABDOMEN: Soft, nontender, nondistended. Bowel sounds present. No organomegaly or mass.  EXTREMITIES: No pedal edema, cyanosis, or clubbing. Restricted range of motion in her left shoulder. Significant pain. NEUROLOGIC: Cranial nerves II through XII are intact. Muscle strength 5/5 in all extremities. Sensation intact. Gait not checked.  PSYCHIATRIC: The patient is alert and oriented x 3.  SKIN: No obvious rash, lesion, or  ulcer.   LABORATORY PANEL:   CBC Recent Labs  Lab 07/27/17 1130  WBC 5.7  HGB 9.6*  HCT 28.1*  PLT 294   ------------------------------------------------------------------------------------------------------------------  Chemistries  Recent Labs  Lab 07/27/17 1130  NA 137  K 3.8  CL 107  CO2 22  GLUCOSE 95  BUN 29*  CREATININE 1.13*  CALCIUM 9.0  AST 30  ALT 17  ALKPHOS 34*  BILITOT 0.5   ------------------------------------------------------------------------------------------------------------------  Cardiac Enzymes No results for input(s): TROPONINI in the last 168 hours. ------------------------------------------------------------------------------------------------------------------  RADIOLOGY:  Dg Chest 1 View  Result Date: 07/27/2017 CLINICAL DATA:  Pain following fall EXAM: CHEST  1 VIEW COMPARISON:  Chest radiograph June 21, 2017 and chest CT June 21, 2017 FINDINGS: No edema or consolidation. Heart size and pulmonary vascularity are normal. No adenopathy. No fracture. Pneumothorax. There is upper thoracic levoscoliosis. IMPRESSION: No edema or consolidation. Electronically Signed   By: Lowella Grip III M.D.   On: 07/27/2017 12:33   Ct Head Wo Contrast  Result Date: 07/27/2017 CLINICAL DATA:  Tripped and fell today.  No loss consciousness. EXAM: CT HEAD WITHOUT CONTRAST TECHNIQUE: Contiguous axial images were obtained from the base of the skull through the vertex without intravenous contrast. COMPARISON:  None. FINDINGS: Brain: No evidence of acute infarction, hemorrhage, hydrocephalus, extra-axial collection or mass lesion/mass effect. Vascular: No hyperdense vessel or unexpected calcification. Skull: No osseous abnormality. Sinuses/Orbits: Visualized paranasal sinuses are clear. Visualized mastoid sinuses are clear. Visualized orbits demonstrate no focal abnormality. Other: None IMPRESSION: No acute intracranial pathology. Electronically Signed   By:  Kathreen Devoid   On: 07/27/2017 12:02   Dg Shoulder Left  Result Date: 07/27/2017 CLINICAL DATA:  Pain following fall EXAM: LEFT SHOULDER - 2+ VIEW COMPARISON:  June 22, 2017 FINDINGS: Frontal, oblique, and Y scapular images obtained. Bones are osteoporotic. No acute fracture or dislocation. There is moderate generalized osteoarthritic change. There are foci of calcification felt to represent a degree of synovial chondromatosis. Visualized left lung clear. IMPRESSION: Moderate osteoarthritic change. Bones osteoporotic. Apparent synovial chondromatosis within the joint. No acute fracture or dislocation. Electronically Signed   By: Lowella Grip III M.D.   On: 07/27/2017 12:32   Dg Knee Complete 4 Views Left  Result Date: 07/27/2017 CLINICAL DATA:  77 year old female status post fall with pain. EXAM: LEFT KNEE - COMPLETE 4+ VIEW COMPARISON:  No prior knee series. FINDINGS: Bone mineralization is within normal limits for age. Joint spaces and alignment at the left knee are normal for age. No joint effusion on the cross-table lateral view. The patella appears intact. No acute osseous abnormality identified. IMPRESSION: No acute fracture or dislocation identified about the left knee. Electronically Signed   By: Genevie Ann M.D.   On: 07/27/2017 12:34   Dg Humerus Left  Result Date: 07/27/2017 CLINICAL DATA:  Pain following fall EXAM: LEFT HUMERUS - 2+ VIEW COMPARISON:  None. FINDINGS: Frontal and lateral views obtained. No fracture or dislocation. 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 No erosive change. IMPRESSION: No fracture or dislocation.  Left shoulder arthropathy. Electronically Signed   By: Lowella Grip III M.D.   On: 07/27/2017 12:34   Dg Hip Unilat With Pelvis 2-3 Views Left  Result Date: 07/27/2017 CLINICAL DATA:  Fall earlier today now with left lower extremity pain. EXAM: DG HIP (WITH OR WITHOUT PELVIS) 2-3V LEFT COMPARISON:  Left hip radiographs-05/31/2017  FINDINGS: No fracture or dislocation. Mild degenerative change of the left hip with joint space loss and subchondral sclerosis. No evidence avascular necrosis. Limited visualization of the pelvis and contralateral right hip is normal. Multiple phleboliths overlie the lower pelvis bilaterally. Regional soft tissues appear otherwise normal. IMPRESSION: 1. No fracture or dislocation. 2. Mild degenerative change of the left hip. Electronically Signed   By: Sandi Mariscal M.D.   On: 07/27/2017 12:34    EKG:    IMPRESSION AND PLAN:   Nejla Reasor  is a 77 y.o. 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 Patient says she usually uses a walker.  1. left shoulder pain following mechanical fall at home -has a history of left rotator cuff injury. She is requiring IV pain medicine for pain control. -Admit to orthopedic floor -orthopedic consultation. Patient has followed with Dr. Posey Pronto as outpatient who was recommended MRI of the left shoulder (according to the clinic note) -IV morphine PRN, Norco, muscle relaxant  2. Recent large saddle pulmonary  embolism status post intra-arterial thrombaectomy -anticoagulation with eliquis  3. Hypertension -continue amlodipine  4. DVT prophylaxis already on eliquis  Physical therapy to see patient  Discussed with patient and daughter  All the records are reviewed and case discussed with ED provider. Management plans discussed with the patient, family and they are in agreement.  CODE STATUS: FULL  TOTAL TIME TAKING CARE OF THIS PATIENT: *50* minutes.    Fritzi Mandes M.D on 07/27/2017 at 1:28 PM  Between 7am to 6pm - Pager - [phone]  After 6pm go to [email or web address] - password [email or web address] [facility]  [facility]  Office  [phone]  CC: Primary care physician; Idelle Crouch,  MD

## 2017-07-27 NOTE — ED Notes (Signed)
Admitting MD at bedside.

## 2017-07-27 NOTE — Care Management (Signed)
RNCM met with patient and her daughter in ED to discuss MOON. She ambulates at home with a walker. She is open to [facility] home health and on chronic Eliquis. I have sent message to Glennis Brink with [facility] to update.

## 2017-07-27 NOTE — ED Provider Notes (Signed)
[facility] Emergency Department Provider Note  ____________________________________________   First MD Initiated Contact with Patient 07/27/17 1114     (approximate)  I have reviewed the triage vital signs and the nursing notes.   HISTORY  Chief Complaint Fall and Shoulder Pain   HPI Ann Russell is a 77 y.o. female who comes to the emergency department via EMS with sudden onset severe left shoulder pain that began shortly prior to arrival when she was walking with a walker and had a mechanical fall onto her left side.  She has significant neuropathy and has frequent falls.  EMS gave her 50 mcg of fentanyl and then re-dose 50 mcg of fentanyl which did improve her pain although her pain persists.  The pain is sudden onset severe nonradiating right upper shoulder worse with movement improved with rest.  She did hit her head but she denies loss of consciousness.  2 weeks ago she had a diagnosis of pulmonary embolism and began taking Eliquis.  She also has some pain in her right knee.  She has been unable to ambulate.  Past Medical History:  Diagnosis Date  . Arthritis    hands  . 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 Chronic kidney disease    .  Takes Vasotec for kidneys.  . Depression   . Hyperlipemia   . Hypertension   . Pulmonary embolism ([place])   . Sleep apnea     Patient Active Problem List   Diagnosis Date Noted  . Shoulder pain, acute 07/27/2017  . Hypertension 07/24/2017  . Pulmonary embolism ([place]) 06/21/2017  . 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 ABDOMINAL HYSTERECTOMY    . Buniectomy     bil  . CHOLECYSTECTOMY    . FACIAL COSMETIC SURGERY  2011  . GASTRIC BYPASS  2010  . PULMONARY THROMBECTOMY Bilateral 06/22/2017   Procedure: PULMONARY THROMBECTOMY;  Surgeon: Algernon Huxley, MD;  Location: [facility];  Service: Cardiovascular;  Laterality:  Bilateral;  . Rotor cuff     right  . SCLERAL BUCKLE  11/07/2011   Procedure: SCLERAL BUCKLE;  Surgeon: Hayden Pedro, MD;  Location: [facility];  Service: Ophthalmology;  Laterality: Left;    Prior to Admission medications   Medication Sig Start Date End Date Taking? Authorizing Provider  Alpha-Lipoic Acid 600 MG CAPS Take by mouth. 09/29/16  Yes [provider]  amLODipine (NORVASC) 5 MG tablet Take 5 mg by mouth daily.  12/21/16 12/21/17 Yes [provider]  apixaban (ELIQUIS) 5 MG TABS tablet Take 1 tablet (5 mg total) by mouth 2 (two) times daily. 06/23/17  Yes Mody, Ulice Bold, MD  busPIRone (BUSPAR) 15 MG tablet Take 1 tablet by mouth 2 (two) times daily. 06/06/17  Yes [provider]  calcitRIOL (ROCALTROL) 0.25 MCG capsule Take 1 capsule by mouth daily. 06/16/17  Yes [provider]  calcium citrate-vitamin D (CITRACAL+D) 315-200 MG-UNIT tablet Take 1 tablet by mouth daily.    Yes [provider]  diphenhydramine-acetaminophen (TYLENOL PM) 25-500 MG TABS Take 2 tablets by mouth at bedtime as needed.   Yes [provider]  FLUoxetine (PROZAC) 20 MG capsule Take 60 mg by mouth daily. 05/20/17  Yes [provider]  Multiple Vitamin (MULTI-VITAMINS) TABS Take by mouth.   Yes [provider]  valACYclovir (VALTREX) 1000 MG tablet Take 1,000 mg by mouth 2 (  two) times daily as needed.    Yes [provider]  vitamin B-12 (CYANOCOBALAMIN) 100 MCG tablet Take 100 mcg by mouth daily.    Yes [provider]  oxyCODONE-acetaminophen (PERCOCET) 5-325 MG tablet Take 1 tablet by mouth every 4 (four) hours as needed for severe pain. Patient not taking: Reported on 07/27/2017 05/31/17   Harvest Dark, MD    Allergies Patient has no known allergies.  Family History  Problem Relation Age of Onset  . Heart disease Mother   . Dementia Mother   . Neuropathy Father   . Diabetes Brother   . Breast cancer Neg Hx     Social  History Social History   Tobacco Use  . Smoking status: Never Smoker  . Smokeless tobacco: Never Used  Substance Use Topics  . Alcohol use: No  . Drug use: No    Review of Systems Constitutional: No fever/chills Eyes: No visual changes. ENT: No sore throat. Cardiovascular: Denies chest pain. Respiratory: Denies shortness of breath. Gastrointestinal: No abdominal pain.  No nausea, no vomiting.  No diarrhea.  No constipation. Genitourinary: Negative for dysuria. Musculoskeletal: Positive for left shoulder pain Skin: Negative for rash. Neurological: Negative for headaches, focal weakness or numbness.   ____________________________________________   PHYSICAL EXAM:  VITAL SIGNS: ED Triage Vitals  Enc Vitals Group     BP      Pulse      Resp      Temp      Temp src      SpO2      Weight      Height      Head Circumference      Peak Flow      Pain Score      Pain Loc      Pain Edu?      Excl. in [place]?     Constitutional: Alert and oriented x4 appears miserable screaming in pain Eyes: PERRL EOMI. Head: Atraumatic. Nose: No congestion/rhinnorhea. Mouth/Throat: No trismus Neck: No stridor.   Cardiovascular: Normal rate, regular rhythm. Grossly normal heart sounds.  Good peripheral circulation. Respiratory: Normal respiratory effort.  No retractions. Lungs CTAB and moving good air Gastrointestinal: Soft nontender Musculoskeletal: She is focally tender over lateral left shoulder with extreme discomfort when ranging left shoulder Neurologic:  Normal speech and language. No gross focal neurologic deficits are appreciated. Skin:  Skin is warm, dry and intact. No rash noted. Psychiatric: Mood and affect are normal. Speech and behavior are normal.    ____________________________________________   DIFFERENTIAL includes but not limited to  Shoulder fracture, shoulder dislocation, hip fracture, mechanical fall ____________________________________________   LABS (all  labs ordered are listed, but only abnormal results are displayed)  Labs Reviewed  COMPREHENSIVE METABOLIC PANEL - Abnormal; Notable for the following components:      Result Value   BUN 29 (*)    Creatinine, Ser 1.13 (*)    Total Protein 6.3 (*)    Albumin 3.4 (*)    Alkaline Phosphatase 34 (*)    GFR calc non Af Amer 46 (*)    GFR calc Af Amer 53 (*)    All other components within normal limits  CBC WITH DIFFERENTIAL/PLATELET - Abnormal; Notable for the following components:   RBC 3.09 (*)    Hemoglobin 9.6 (*)    HCT 28.1 (*)    RDW 16.4 (*)    All other components within normal limits  CBC  CREATININE, SERUM  TYPE AND SCREEN  Lab work reviewed by me with no acute disease __________________________________________  EKG   RADIOLOGY  All imaging reviewed by me with no acute disease ____________________________________________   PROCEDURES  Procedure(s) performed: no  Procedures  Critical Care performed: no  Observation: no ____________________________________________   INITIAL IMPRESSION / ASSESSMENT AND PLAN / ED COURSE  Pertinent labs & imaging results that were available during my care of the patient were reviewed by me and considered in my medical decision making (see chart for details).  The patient arrives uncomfortable appearing after receiving 100 mcg of fentanyl.  Neuro intact.  Imaging is pending after this mechanical fall.  Fortunately patient's imaging is negative for acute pathology.  At this point however she is received a total of 200 mcg of fentanyl.  She does have known left rotator cuff injury and this likely is an exacerbation.  The patient ambulates with a walker at baseline and is unable to use the walker at this time secondary to her acute injury to her left shoulder.  She requires inpatient admission for pain control and physical therapy.  I discussed with the patient and family who verbalized understanding and agreement with the plan.   I then discussed with the hospitalist who has graciously agreed to admit the patient to her service.      ____________________________________________   FINAL CLINICAL IMPRESSION(S) / ED DIAGNOSES  Final diagnoses:  Acute pain of left shoulder      NEW MEDICATIONS STARTED DURING THIS VISIT:  New Prescriptions   No medications on file     Note:  This document was prepared using Dragon voice recognition software and may include unintentional dictation errors.     Darel Hong, MD 07/27/17 [phone]

## 2017-07-27 NOTE — ED Notes (Signed)
Patient transported to CT 

## 2017-07-27 NOTE — ED Notes (Signed)
Purewick urine collection device applied to patient.

## 2017-07-27 NOTE — Care Management Obs Status (Signed)
[place] NOTIFICATION   Patient Details  Name: GENNY CAULDER MRN: [id number] Date of Birth: 04-Dec-1940   Medicare Observation Status Notification Given:  Yes    Marshell Garfinkel, RN 07/27/2017, 1:15 PM

## 2017-07-27 NOTE — ED Notes (Signed)
Released Valtrex order, per pt request to take home medication while waiting for admission bed.

## 2017-07-28 ENCOUNTER — Observation Stay: Payer: Medicare Other

## 2017-07-28 MED ORDER — OXYCODONE-ACETAMINOPHEN 5-325 MG PO TABS: 1.0000 | ORAL_TABLET | Freq: Four times a day (QID) | ORAL | 0 refills | Status: DC | PRN

## 2017-07-28 MED ORDER — TRIAMCINOLONE ACETONIDE 40 MG/ML IJ SUSP
40.0000 mg | Freq: Once | INTRAMUSCULAR | Status: DC
  Filled 2017-07-28: qty 1

## 2017-07-28 MED ORDER — BUPIVACAINE HCL (PF) 0.5 % IJ SOLN
5.0000 mL | Freq: Once | INTRAMUSCULAR | Status: DC
  Filled 2017-07-28: qty 10

## 2017-07-28 NOTE — Consult Note (Signed)
ORTHOPAEDIC CONSULTATION  REQUESTING PHYSICIAN: Saundra Shelling, MD  Chief Complaint:   Left shoulder pain.  History of Present Illness: Ann Russell is a 77 y.o. female with multiple medical problems including chronic kidney disease, depression, hypertension, hyperlipidemia, and recently underwent a surgical excision of a saddle embolus by Dr. Lucky Cowboy for which she is on chronic anticoagulation.  The patient lives independently with a friend and normally ambulates with a walker.  She has had a several month history of left shoulder pain following a fall.  She was evaluated several weeks ago by Dr. Posey Pronto, my partner, who diagnosed her with cuff arthropathy and plan on performing additional workup as an outpatient.  Apparently, the patient fell again recently, resulting in increased left shoulder pain.  She was brought to the emergency room because of the pain and difficulty ambulating safely in her home.  X-rays in the emergency room showed no evidence for a proximal humerus fracture.  The patient subsequently was admitted for further evaluation and treatment.  The patient notes the inability to lift her arm even to shoulder level due to pain and weakness.  She denies any numbness or paresthesias down her arm to her hand.  Past Medical History:  Diagnosis Date  . Arthritis    hands  . 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 Chronic kidney disease    .  Takes Vasotec for kidneys.  . Depression   . Hyperlipemia   . Hypertension   . Pulmonary embolism ([place])   . Sleep apnea    Past Surgical History:  Procedure Laterality Date  . ABDOMINAL HYSTERECTOMY    . Buniectomy     bil  . CHOLECYSTECTOMY    . FACIAL COSMETIC SURGERY  2011  . GASTRIC BYPASS  2010  . PULMONARY THROMBECTOMY Bilateral 06/22/2017   Procedure: PULMONARY THROMBECTOMY;  Surgeon: Algernon Huxley, MD;  Location: [facility];  Service:  Cardiovascular;  Laterality: Bilateral;  . Rotor cuff     right  . SCLERAL BUCKLE  11/07/2011   Procedure: SCLERAL BUCKLE;  Surgeon: Hayden Pedro, MD;  Location: [facility];  Service: Ophthalmology;  Laterality: Left;   Social History   Socioeconomic History  . Marital status: Single    Spouse name: Not on file  . Number of children: Not on file  . Years of education: Not on file  . Highest education level: Not on file  Occupational History  . Occupation: retired  Scientific laboratory technician  . Financial resource strain: Not on file  . Food insecurity:    Worry: Not on file    Inability: Not on file  . Transportation needs:    Medical: Not on file    Non-medical: Not on file  Tobacco Use  . Smoking status: Never Smoker  . Smokeless tobacco: Never Used  Substance and Sexual Activity  . Alcohol use: No  . Drug use: No  . Sexual activity: Not Currently  Lifestyle  . Physical activity:    Days per week: Not on file    Minutes per session: Not on file  . Stress: Not on file  Relationships  . Social connections:    Talks on phone: Not on file    Gets together: Not on file    Attends religious service: Not on file    Active member of club or organization: Not on file    Attends meetings of clubs or organizations: Not on file  Relationship status: Not on file  Other Topics Concern  . Not on file  Social History Narrative  . Not on file   Family History  Problem Relation Age of Onset  . Heart disease Mother   . Dementia Mother   . Neuropathy Father   . Diabetes Brother   . Breast cancer Neg Hx    No Known Allergies Prior to Admission medications   Medication Sig Start Date End Date Taking? Authorizing Provider  Alpha-Lipoic Acid 600 MG CAPS Take by mouth. 09/29/16  Yes [provider]  amLODipine (NORVASC) 5 MG tablet Take 5 mg by mouth daily.  12/21/16 12/21/17 Yes [provider]  apixaban (ELIQUIS) 5 MG TABS tablet Take 1 tablet (5 mg total) by mouth 2 (two)  times daily. 06/23/17  Yes Mody, Ulice Bold, MD  busPIRone (BUSPAR) 15 MG tablet Take 1 tablet by mouth 2 (two) times daily. 06/06/17  Yes [provider]  calcitRIOL (ROCALTROL) 0.25 MCG capsule Take 1 capsule by mouth daily. 06/16/17  Yes [provider]  calcium citrate-vitamin D (CITRACAL+D) 315-200 MG-UNIT tablet Take 1 tablet by mouth daily.    Yes [provider]  diphenhydramine-acetaminophen (TYLENOL PM) 25-500 MG TABS Take 2 tablets by mouth at bedtime as needed.   Yes [provider]  FLUoxetine (PROZAC) 20 MG capsule Take 60 mg by mouth daily. 05/20/17  Yes [provider]  Multiple Vitamin (MULTI-VITAMINS) TABS Take by mouth.   Yes [provider]  valACYclovir (VALTREX) 1000 MG tablet Take 1,000 mg by mouth 2 (two) times daily as needed.    Yes [provider]  vitamin B-12 (CYANOCOBALAMIN) 100 MCG tablet Take 100 mcg by mouth daily.    Yes [provider]  oxyCODONE-acetaminophen (PERCOCET) 5-325 MG tablet Take 1 tablet by mouth every 4 (four) hours as needed for severe pain. Patient not taking: Reported on 07/27/2017 05/31/17   Harvest Dark, MD   Dg Chest 1 View  Result Date: 07/27/2017 CLINICAL DATA:  Pain following fall EXAM: CHEST  1 VIEW COMPARISON:  Chest radiograph June 21, 2017 and chest CT June 21, 2017 FINDINGS: No edema or consolidation. Heart size and pulmonary vascularity are normal. No adenopathy. No fracture. Pneumothorax. There is upper thoracic levoscoliosis. IMPRESSION: No edema or consolidation. Electronically Signed   By: Lowella Grip III M.D.   On: 07/27/2017 12:33   Ct Head Wo Contrast  Result Date: 07/27/2017 CLINICAL DATA:  Tripped and fell today.  No loss consciousness. EXAM: CT HEAD WITHOUT CONTRAST TECHNIQUE: Contiguous axial images were obtained from the base of the skull through the vertex without intravenous contrast. COMPARISON:  None. FINDINGS: Brain: No evidence of acute  infarction, hemorrhage, hydrocephalus, extra-axial collection or mass lesion/mass effect. Vascular: No hyperdense vessel or unexpected calcification. Skull: No osseous abnormality. Sinuses/Orbits: Visualized paranasal sinuses are clear. Visualized mastoid sinuses are clear. Visualized orbits demonstrate no focal abnormality. Other: None IMPRESSION: No acute intracranial pathology. Electronically Signed   By: Kathreen Devoid   On: 07/27/2017 12:02   Dg Shoulder Left  Result Date: 07/27/2017 CLINICAL DATA:  Pain following fall EXAM: LEFT SHOULDER - 2+ VIEW COMPARISON:  June 22, 2017 FINDINGS: Frontal, oblique, and Y scapular images obtained. Bones are osteoporotic. No acute fracture or dislocation. There is moderate generalized osteoarthritic change. There are foci of calcification felt to represent a degree of synovial chondromatosis. Visualized left lung clear. IMPRESSION: Moderate osteoarthritic change. Bones osteoporotic. Apparent synovial chondromatosis within the joint. No acute fracture or dislocation.  Electronically Signed   By: Lowella Grip III M.D.   On: 07/27/2017 12:32   Dg Knee Complete 4 Views Left  Result Date: 07/27/2017 CLINICAL DATA:  77 year old female status post fall with pain. EXAM: LEFT KNEE - COMPLETE 4+ VIEW COMPARISON:  No prior knee series. FINDINGS: Bone mineralization is within normal limits for age. Joint spaces and alignment at the left knee are normal for age. No joint effusion on the cross-table lateral view. The patella appears intact. No acute osseous abnormality identified. IMPRESSION: No acute fracture or dislocation identified about the left knee. Electronically Signed   By: Genevie Ann M.D.   On: 07/27/2017 12:34   Dg Humerus Left  Result Date: 07/27/2017 CLINICAL DATA:  Pain following fall EXAM: LEFT HUMERUS - 2+ VIEW COMPARISON:  None. FINDINGS: Frontal and lateral views obtained. No fracture or dislocation. 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 No erosive change. IMPRESSION: No fracture or dislocation.  Left shoulder arthropathy. Electronically Signed   By: Lowella Grip III M.D.   On: 07/27/2017 12:34   Dg Hip Unilat With Pelvis 2-3 Views Left  Result Date: 07/27/2017 CLINICAL DATA:  Fall earlier today now with left lower extremity pain. EXAM: DG HIP (WITH OR WITHOUT PELVIS) 2-3V LEFT COMPARISON:  Left hip radiographs-05/31/2017 FINDINGS: No fracture or dislocation. Mild degenerative change of the left hip with joint space loss and subchondral sclerosis. No evidence avascular necrosis. Limited visualization of the pelvis and contralateral right hip is normal. Multiple phleboliths overlie the lower pelvis bilaterally. Regional soft tissues appear otherwise normal. IMPRESSION: 1. No fracture or dislocation. 2. Mild degenerative change of the left hip. Electronically Signed   By: Sandi Mariscal M.D.   On: 07/27/2017 12:34    Positive ROS: All other systems have been reviewed and were otherwise negative with the exception of those mentioned in the HPI and as above.  Physical Exam: General:  Alert, no acute distress Psychiatric:  Patient is competent for consent with normal mood and affect   Cardiovascular:  No pedal edema Respiratory:  No wheezing, non-labored breathing GI:  Abdomen is soft and non-tender Skin:  No lesions in the area of chief complaint Neurologic:  Sensation intact distally Lymphatic:  No axillary or cervical lymphadenopathy  Orthopedic Exam:  Orthopedic examination is limited left shoulder and upper extremity.  Skin inspection around the shoulder is unremarkable.  There is no swelling, erythema, ecchymosis, abrasions, or other skin normality is identified.  Actively, the patient is unable to actively forward flex or abduct her shoulder.  She is able to flex and extend her elbow, wrist, and all digits of her left hand.  Passively, she can be forward flex to 140 degrees 20 degrees.  She has pain with these  motions.  She exhibits 4/5 strength with resisted internal rotation and 3+/5 strength with resisted external rotation with arm at her side.  She has pain with resisted external rotation.  She also exhibits 3+-4/5 strength with gentle resisted abduction with her arm at her side.  She is neurovascularly intact to her left upper extremity and hand.  X-rays:  Recent x-rays of the left shoulder are available for review.  These films demonstrate no evidence for fractures or lytic lesions, but do demonstrate early degenerative changes.  In addition, the humeral head is high-riding, consistent with a chronic massive rotator cuff tear.  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  Plan: The treatment options are  discussed with the patient and her daughter, who is at the bedside.  Given her recent surgical treatment for a massive pulmonary embolus, I am not sure if she would be a surgical candidate at this time.  The surgical procedure of choice would be a reverse shoulder replacement.  However, we can proceed with the MRI scan that was to be ordered by Dr. Posey Pronto as an outpatient while the patient is here in the hospital.    In addition, the patient is offered and accepts a steroid injection into the left shoulder.  After obtaining verbal consent, this injection is performed sterilely using 1 cc of Kenalog 40 and 4 cc of 0.5% Sensorcaine.  The patient tolerated the procedure well.  The patient may be mobilized with physical therapy as symptoms permit.  She is to follow-up with Dr. Posey Pronto or myself in the office in the next few weeks.  Thank you for asked me to participate in the care of this most pleasant woman.   Pascal Lux, MD  Beeper #:  [phone]  07/28/2017 9:13 AM

## 2017-07-28 NOTE — Progress Notes (Signed)
Patient is being discharged home. IV removed with cath intact.  Reviewed mesd, scripts, and last dose given. Allowed time for questions. Friend at beside during instructions.

## 2017-07-28 NOTE — Care Management (Signed)
Notified [facility] of discharge and orders for RN PT Aide.

## 2017-07-28 NOTE — Progress Notes (Signed)
Patient transferred to room 136. Jamie RN in room to receive patient. No complaints at time of transfer.

## 2017-07-29 NOTE — Discharge Summary (Signed)
[place] at [facility], 77 y.o., DOB 10/13/1940, MRN [id number]. Admission date: 07/27/2017 Discharge Date 07/28/2017 Primary MD Idelle Crouch, MD Admitting Physician Fritzi Mandes, MD  Admission Diagnosis   1.  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  Hypertension Discharge Diagnosis       1.  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  Hospital Course  77 year old female patient with history of recent saddle pulmonary embolism status post intra-arterial thrombectomy currently on Eliquis for anticoagulation was admitted on 07/27/2017 for mechanical fall and left shoulder pain.  The pain was excruciating and she was admitted under observation for pain control. She follows up with orthopedic doctor Dr. Posey Pronto in the clinic.  Patient was started on IV morphine as needed for pain and oral Norco along with muscle relaxant.  She was evaluated by orthopedic attending Dr. Milagros Evener who recommended a reverse shoulder replacement.  Considering recent massive pulmonary embolism and not a surgical candidate at this time.  Had an MRI of the left shoulder in the hospital showed full-thickness tear in the infraspinatus tendon and supraspinatus tendon.  Follow-up with orthopedics as outpatient.  Consults orthopedic surgery consultation  Significant Tests:  See full reports for all details   Dg Chest 1 View  Result Date: 07/27/2017 CLINICAL DATA:  Pain following fall EXAM: CHEST  1 VIEW COMPARISON:  Chest radiograph June 21, 2017 and chest CT June 21, 2017 FINDINGS: No edema or consolidation. Heart size and pulmonary vascularity are normal. No adenopathy. No fracture. Pneumothorax. There is upper thoracic levoscoliosis.  IMPRESSION: No edema or consolidation. Electronically Signed   By: Lowella Grip III M.D.   On: 07/27/2017 12:33   Ct Head Wo Contrast  Result Date: 07/27/2017 CLINICAL DATA:  Tripped and fell today.  No loss consciousness. EXAM: CT HEAD WITHOUT CONTRAST TECHNIQUE: Contiguous axial images were obtained from the base of the skull through the vertex without intravenous contrast. COMPARISON:  None. FINDINGS: Brain: No evidence of acute infarction, hemorrhage, hydrocephalus, extra-axial collection or mass lesion/mass effect. Vascular: No hyperdense vessel or unexpected calcification. Skull: No osseous abnormality. Sinuses/Orbits: Visualized paranasal sinuses are clear. Visualized mastoid sinuses are clear. Visualized orbits demonstrate no focal abnormality. Other: None IMPRESSION: No acute intracranial pathology. Electronically Signed   By: Kathreen Devoid   On: 07/27/2017 12:02   Mr Shoulder Left Wo Contrast  Result Date: 07/28/2017 CLINICAL DATA:  Fall, left shoulder pain. EXAM: MRI OF THE LEFT SHOULDER WITHOUT CONTRAST TECHNIQUE: Multiplanar, multisequence MR imaging of the shoulder was performed. No intravenous contrast was administered. COMPARISON:  Radiographs from 07/27/2017 FINDINGS: Rotator cuff: Full-thickness full width rupture of the supraspinatus with 3.8 cm retraction essentially back to the plane of the glenoid. Full-thickness full width rupture of the infraspinatus tendon is suspected with severely degraded distal tendon but only minimal retraction, with a highly disorganized and irregular distal margin of the infraspinatus about 5 mm from its attachment site. Questionable attachment of some fibers versus debris in this vicinity. I am skeptical that the amount of attachment between the infraspinatus in the humeral head, if any, is biomechanically significant. Muscles: Mild supraspinatus and infraspinatus atrophy with edema tracking in the infraspinatus muscle. Biceps long head: Nonvisualization of  the long head of the biceps, favoring prior rupture. Acromioclavicular Joint: Moderate  spurring. Fluid in the [facility] joint communicates with the subacromial subdeltoid bursa. Type II acromion. There is synovitis in the subacromial subdeltoid bursa along with at least 1 free osteochondral fragment. Glenohumeral Joint: Joint effusion. Superior subluxation of the humerus with respect to the glenoid. Moderate degenerative chondral thinning with moderate glenohumeral spurring. Several small osteochondral fragments are present in the biceps recess region. There is synovitis in the joint for example posteriorly on image 13/3. Labrum: Ill-defined superior labrum due to the degree of synovitis and debris. Mild bony irregularity adjacent to the superior labrum. I do not observe a definite labral tear. Bones: No significant extra-articular osseous abnormalities identified. Other: No supplemental non-categorized findings. IMPRESSION: 1. Likely acute full-thickness full width rupture of the infraspinatus tendon with only about 5 mm of retraction. Blunted disorganized and irregular distal margin. Associated edema in the infraspinatus muscle. 2. Chronic appearing full-thickness full width rupture of the supraspinatus tendon retracted back to the plane of the glenoid, with superior humeral subluxation. 3. Ruptured long head of the biceps, uncertain chronicity. 4. Joint effusion spanning into the subacromial subdeltoid bursa, with synovitis in the joint and bursa, and several free osteochondral fragments. Electronically Signed   By: Van Clines M.D.   On: 07/28/2017 10:44   Dg Shoulder Left  Result Date: 07/27/2017 CLINICAL DATA:  Pain following fall EXAM: LEFT SHOULDER - 2+ VIEW COMPARISON:  June 22, 2017 FINDINGS: Frontal, oblique, and Y scapular images obtained. Bones are osteoporotic. No acute fracture or dislocation. There is moderate generalized osteoarthritic change. There are foci of calcification felt to represent a  degree of synovial chondromatosis. Visualized left lung clear. IMPRESSION: Moderate osteoarthritic change. Bones osteoporotic. Apparent synovial chondromatosis within the joint. No acute fracture or dislocation. Electronically Signed   By: Lowella Grip III M.D.   On: 07/27/2017 12:32   Dg Knee Complete 4 Views Left  Result Date: 07/27/2017 CLINICAL DATA:  77 year old female status post fall with pain. EXAM: LEFT KNEE - COMPLETE 4+ VIEW COMPARISON:  No prior knee series. FINDINGS: Bone mineralization is within normal limits for age. Joint spaces and alignment at the left knee are normal for age. No joint effusion on the cross-table lateral view. The patella appears intact. No acute osseous abnormality identified. IMPRESSION: No acute fracture or dislocation identified about the left knee. Electronically Signed   By: Genevie Ann M.D.   On: 07/27/2017 12:34   Dg Humerus Left  Result Date: 07/27/2017 CLINICAL DATA:  Pain following fall EXAM: LEFT HUMERUS - 2+ VIEW COMPARISON:  None. FINDINGS: Frontal and lateral views obtained. No fracture or dislocation. 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 No erosive change. IMPRESSION: No fracture or dislocation.  Left shoulder arthropathy. Electronically Signed   By: Lowella Grip III M.D.   On: 07/27/2017 12:34   Dg Hip Unilat With Pelvis 2-3 Views Left  Result Date: 07/27/2017 CLINICAL DATA:  Fall earlier today now with left lower extremity pain. EXAM: DG HIP (WITH OR WITHOUT PELVIS) 2-3V LEFT COMPARISON:  Left hip radiographs-05/31/2017 FINDINGS: No fracture or dislocation. Mild degenerative change of the left hip with joint space loss and subchondral sclerosis. No evidence avascular necrosis. Limited visualization of the pelvis and contralateral right hip is normal. Multiple phleboliths overlie the lower pelvis bilaterally. Regional soft tissues appear otherwise normal. IMPRESSION: 1. No fracture or dislocation. 2. Mild  degenerative change of the left hip. Electronically Signed   By: Sandi Mariscal M.D.   On: 07/27/2017 12:34  Today   Subjective:   Ann Russell is a pleasant 77 year old female patient seen and evaluated by me on 07/28/2017. Left shoulder pain tolerated with pain medication No chest pain No shortness of breath  Objective:   Blood pressure 129/70, pulse 68, temperature 98.5 F (36.9 C), temperature source Oral, resp. rate 16, height 5\' 8"  (1.727 m), weight 65.8 kg (145 lb), SpO2 98 %.  . No intake or output data in the 24 hours ending 07/29/17 1442  Exam VITAL SIGNS: Blood pressure 129/70, pulse 68, temperature 98.5 F (36.9 C), temperature source Oral, resp. rate 16, height 5\' 8"  (1.727 m), weight 65.8 kg (145 lb), SpO2 98 %.  GENERAL:  77 y.o.-year-old patient lying in the bed with no acute distress.  EYES: Pupils equal, round, reactive to light and accommodation. No scleral icterus. Extraocular muscles intact.  HEENT: Head atraumatic, normocephalic. Oropharynx and nasopharynx clear.  NECK:  Supple, no jugular venous distention. No thyroid enlargement, no tenderness.  LUNGS: Normal breath sounds bilaterally, no wheezing, rales,rhonchi or crepitation. No use of accessory muscles of respiration.  CARDIOVASCULAR: S1, S2 normal. No murmurs, rubs, or gallops.  ABDOMEN: Soft, nontender, nondistended. Bowel sounds present. No organomegaly or mass.  EXTREMITIES: No pedal edema, cyanosis, or clubbing.  Tenderness in the left shoulder NEUROLOGIC: Cranial nerves II through XII are intact. Muscle strength 5/5 in all extremities. Sensation intact. Gait not checked.  PSYCHIATRIC: The patient is alert and oriented x 3.  SKIN: No obvious rash, lesion, or ulcer.   Data Review     CBC w Diff:  Lab Results  Component Value Date   WBC 5.7 07/27/2017   HGB 9.6 (L) 07/27/2017   HCT 28.1 (L) 07/27/2017   PLT 294 07/27/2017   LYMPHOPCT 29 07/27/2017   MONOPCT 8 07/27/2017   EOSPCT 2  07/27/2017   BASOPCT 1 07/27/2017   CMP:  Lab Results  Component Value Date   NA 137 07/27/2017   K 3.8 07/27/2017   CL 107 07/27/2017   CO2 22 07/27/2017   BUN 29 (H) 07/27/2017   CREATININE 1.13 (H) 07/27/2017   PROT 6.3 (L) 07/27/2017   ALBUMIN 3.4 (L) 07/27/2017   BILITOT 0.5 07/27/2017   ALKPHOS 34 (L) 07/27/2017   AST 30 07/27/2017   ALT 17 07/27/2017  .  Micro Results No results found for this or any previous visit (from the past 240 hour(s)).   Code Status History    Date Active Date Inactive Code Status Order ID Comments User Context   07/27/2017 1303 07/28/2017 1747 Full Code [id number]  Fritzi Mandes, MD ED   06/21/2017 2118 06/23/2017 1658 Full Code [id number]  Saundra Shelling, MD Inpatient    Advance Directive Documentation     Most Recent Value  Type of Advance Directive  Healthcare Power of Attorney  Pre-existing out of facility DNR order (yellow form or pink MOST form)  -  "MOST" Form in Place?  -          Follow-up Information    Poggi, Marshall Cork, MD Follow up in 1 week(s).   Specialty:  Surgery Contact information: [place] [address] [phone]        Idelle Crouch, MD Follow up in 1 week(s).   Specialty:  Internal Medicine Contact information: [place] [address] [phone]        [facility] Follow up.   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   amLODipine 5 MG tablet Commonly known as:  NORVASC Take 5 mg by mouth daily.   apixaban 5 MG Tabs tablet Commonly known as:  ELIQUIS Take 1 tablet (5 mg total) by mouth 2 (two)  times daily.   busPIRone 15 MG tablet Commonly known as:  BUSPAR Take 1 tablet by mouth 2 (two) times daily.   calcitRIOL 0.25 MCG capsule Commonly known as:  ROCALTROL Take 1 capsule by mouth daily.   calcium citrate-vitamin D 315-200 MG-UNIT tablet Commonly known as:  CITRACAL+D Take 1 tablet by mouth daily.   diphenhydramine-acetaminophen 25-500 MG Tabs tablet Commonly known as:  TYLENOL PM Take 2 tablets by mouth at bedtime as needed.   FLUoxetine 20 MG capsule Commonly known as:  PROZAC Take 60 mg by mouth daily.   MULTI-VITAMINS Tabs Take by mouth.   oxyCODONE-acetaminophen 5-325 MG tablet Commonly known as:  PERCOCET Take 1 tablet by mouth every 6 (six) hours as needed for severe pain. What changed:  when to take this   valACYclovir 1000 MG tablet Commonly known as:  VALTREX Take 1,000 mg by mouth 2 (two) times daily as needed.   vitamin B-12 100 MCG tablet Commonly known as:  CYANOCOBALAMIN Take 100 mcg by mouth daily.          Total Time in preparing paper work, data evaluation and todays exam - 35 minutes  Saundra Shelling M.D on 07/29/2017 at [place]  [phone]

## 2017-08-02 DIAGNOSIS — M12812 Other specific arthropathies, not elsewhere classified, left shoulder: Secondary | ICD-10-CM | POA: Insufficient documentation

## 2017-08-15 ENCOUNTER — Other Ambulatory Visit: Payer: Medicare Other

## 2017-08-15 ENCOUNTER — Ambulatory Visit: Payer: Medicare Other | Admitting: Oncology

## 2017-12-28 ENCOUNTER — Ambulatory Visit (INDEPENDENT_AMBULATORY_CARE_PROVIDER_SITE_OTHER): Payer: Medicare Other | Admitting: Vascular Surgery

## 2018-10-22 ENCOUNTER — Other Ambulatory Visit: Payer: Self-pay | Admitting: Orthopedic Surgery

## 2018-10-22 DIAGNOSIS — M1711 Unilateral primary osteoarthritis, right knee: Secondary | ICD-10-CM

## 2018-10-28 ENCOUNTER — Other Ambulatory Visit: Payer: Self-pay

## 2018-10-28 ENCOUNTER — Ambulatory Visit
Admission: RE | Admit: 2018-10-28 | Discharge: 2018-10-28 | Disposition: A | Discharge status: Home / Self Care | Payer: Medicare Other | Source: Ambulatory Visit | Attending: Orthopedic Surgery | Admitting: Orthopedic Surgery

## 2018-10-28 DIAGNOSIS — M1711 Unilateral primary osteoarthritis, right knee: Secondary | ICD-10-CM | POA: Diagnosis not present

## 2018-11-26 ENCOUNTER — Inpatient Hospital Stay: Admission: RE | Admit: 2018-11-26 | Payer: Medicare Other | Source: Ambulatory Visit

## 2018-11-27 ENCOUNTER — Inpatient Hospital Stay: Admission: RE | Admit: 2018-11-27 | Payer: Medicare Other | Source: Ambulatory Visit

## 2018-11-28 ENCOUNTER — Other Ambulatory Visit: Payer: Self-pay

## 2018-11-28 ENCOUNTER — Encounter
Admission: RE | Admit: 2018-11-28 | Discharge: 2018-11-28 | Disposition: A | Discharge status: Home / Self Care | Payer: Medicare Other | Source: Ambulatory Visit | Attending: Orthopedic Surgery | Admitting: Orthopedic Surgery

## 2018-11-28 DIAGNOSIS — Z20828 Contact with and (suspected) exposure to other viral communicable diseases: Secondary | ICD-10-CM | POA: Diagnosis not present

## 2018-11-28 DIAGNOSIS — Z01818 Encounter for other preprocedural examination: Secondary | ICD-10-CM | POA: Diagnosis present

## 2018-11-28 DIAGNOSIS — M1711 Unilateral primary osteoarthritis, right knee: Secondary | ICD-10-CM | POA: Insufficient documentation

## 2018-11-28 LAB — URINALYSIS, ROUTINE W REFLEX MICROSCOPIC
Bilirubin Urine: NEGATIVE
Glucose, UA: NEGATIVE mg/dL
Hgb urine dipstick: NEGATIVE
Ketones, ur: NEGATIVE mg/dL
Leukocytes,Ua: NEGATIVE
Nitrite: NEGATIVE
Protein, ur: NEGATIVE mg/dL
Specific Gravity, Urine: 1.015 (ref 1.005–1.030)
pH: 5 (ref 5.0–8.0)

## 2018-11-28 LAB — SURGICAL PCR SCREEN
MRSA, PCR: NEGATIVE
Staphylococcus aureus: NEGATIVE

## 2018-11-28 LAB — SEDIMENTATION RATE: Sed Rate: 35 mm/hr — ABNORMAL HIGH (ref 0–30)

## 2018-11-28 LAB — TYPE AND SCREEN
ABO/RH(D): A POS
Antibody Screen: NEGATIVE

## 2018-11-28 LAB — BASIC METABOLIC PANEL
Anion gap: 9 (ref 5–15)
BUN: 55 mg/dL — ABNORMAL HIGH (ref 8–23)
CO2: 25 mmol/L (ref 22–32)
Calcium: 9.8 mg/dL (ref 8.9–10.3)
Chloride: 100 mmol/L (ref 98–111)
Creatinine, Ser: 1.35 mg/dL — ABNORMAL HIGH (ref 0.44–1.00)
GFR calc Af Amer: 44 mL/min — ABNORMAL LOW (ref >60)
GFR calc non Af Amer: 38 mL/min — ABNORMAL LOW (ref >60)
Glucose, Bld: 97 mg/dL (ref 70–99)
Potassium: 4.3 mmol/L (ref 3.5–5.1)
Sodium: 134 mmol/L — ABNORMAL LOW (ref 135–145)

## 2018-11-28 LAB — CBC
HCT: 35.1 % — ABNORMAL LOW (ref 36.0–46.0)
Hemoglobin: 11.4 g/dL — ABNORMAL LOW (ref 12.0–15.0)
MCH: 29.9 pg (ref 26.0–34.0)
MCHC: 32.5 g/dL (ref 30.0–36.0)
MCV: 92.1 fL (ref 80.0–100.0)
Platelets: 256 10*3/uL (ref 150–400)
RBC: 3.81 MIL/uL — ABNORMAL LOW (ref 3.87–5.11)
RDW: 15.8 % — ABNORMAL HIGH (ref 11.5–15.5)
WBC: 6.3 10*3/uL (ref 4.0–10.5)
nRBC: 0 % (ref 0.0–0.2)

## 2018-11-28 LAB — APTT: aPTT: 34 seconds (ref 24–36)

## 2018-11-28 LAB — PROTIME-INR
INR: 1.5 — ABNORMAL HIGH (ref 0.8–1.2)
Prothrombin Time: 17.6 seconds — ABNORMAL HIGH (ref 11.4–15.2)

## 2018-11-28 NOTE — Patient Instructions (Signed)
Your procedure is scheduled on: 12-03-18 TUESDAY Report to Same Day Surgery 2nd floor medical mall [facility] Entrance-take elevator on left to 2nd floor.  Check in with surgery information desk.) To find out your arrival time please call [phone] between 1PM - 3PM on 12-02-18 MONDAY  Remember: Instructions that are not followed completely may result in serious medical risk, up to and including death, or upon the discretion of your surgeon and anesthesiologist your surgery may need to be rescheduled.    _x___ 1. Do not eat food after midnight the night before your procedure. NO GUM OR CANDY AFTER MIDNIGHT. You may drink clear liquids up to 2 hours before you are scheduled to arrive at the hospital for your procedure.  Do not drink clear liquids within 2 hours of your scheduled arrival to the hospital.  Clear liquids include  --Water or Apple juice without pulp  --Clear carbohydrate beverage such as ClearFast or Gatorade  --Black Coffee or Clear Tea (No milk, no creamers, do not add anything to the coffee or Tea   ____Ensure clear carbohydrate drink on the way to the hospital for bariatric patients  ____Ensure clear carbohydrate drink 3 hours before surgery.     __x__ 2. No Alcohol for 24 hours before or after surgery.   __x__3. No Smoking or e-cigarettes for 24 prior to surgery.  Do not use any chewable tobacco products for at least 6 hour prior to surgery   ____  4. Bring all medications with you on the day of surgery if instructed.    __x__ 5. Notify your doctor if there is any change in your medical condition     (cold, fever, infections).    x___6. On the morning of surgery brush your teeth with toothpaste and water.  You may rinse your mouth with mouth wash if you wish.  Do not swallow any toothpaste or mouthwash.   Do not wear jewelry, make-up, hairpins, clips or nail polish.  Do not wear lotions, powders, or perfumes. You may wear deodorant.  Do not shave 48 hours prior  to surgery. Men may shave face and neck.  Do not bring valuables to the hospital.    [facility] is not responsible for any belongings or valuables.               Contacts, dentures or bridgework may not be worn into surgery.  Leave your suitcase in the car. After surgery it may be brought to your room.  For patients admitted to the hospital, discharge time is determined by your treatment team.  _  Patients discharged the day of surgery will not be allowed to drive home.  You will need someone to drive you home and stay with you the night of your procedure.    Please read over the following fact sheets that you were given:   [facility] Preparing for Surgery and or MRSA Information   _x___ TAKE THE FOLLOWING MEDICATION THE MORNING OF SURGERY WITH A SMALL SIP OF WATER. These include:  1. BUSPAR (BUSPIRONE)  2. PROZAC (FLUOXETINE)  3. LYRICA ( PREGABALIN)  4.  5.  6.  ____Fleets enema or Magnesium Citrate as directed.   _x___ Use CHG Soap or sage wipes as directed on instruction sheet   ____ Use inhalers on the day of surgery and bring to hospital day of surgery  ____ Stop Metformin and Janumet 2 days prior to surgery.    ____ Take 1/2 of usual insulin dose the  night before surgery and none on the morning surgery.   _x___ Follow recommendations from Cardiologist, Pulmonologist or PCP regarding stopping Aspirin, Coumadin, Plavix ,Eliquis, Effient, or Pradaxa, and Pletal-CALL DR MENZ'S OFFICE ABOUT WHEN TO STOP YOUR ELIQUIS  X____Stop Anti-inflammatories such as Advil, Aleve, Ibuprofen, Motrin, Naproxen, Naprosyn, Goodies powders or aspirin products NOW-OK to take Tylenol   ____ Stop supplements until after surgery.    ____ Bring C-Pap to the hospital.

## 2018-11-29 ENCOUNTER — Other Ambulatory Visit
Admission: RE | Admit: 2018-11-29 | Discharge: 2018-11-29 | Disposition: A | Discharge status: Home / Self Care | Payer: Medicare Other | Source: Ambulatory Visit | Attending: Orthopedic Surgery | Admitting: Orthopedic Surgery

## 2018-11-29 DIAGNOSIS — Z01818 Encounter for other preprocedural examination: Secondary | ICD-10-CM | POA: Diagnosis not present

## 2018-11-29 LAB — URINE CULTURE: Culture: <10000 — AB

## 2018-11-30 LAB — SARS CORONAVIRUS 2 (TAT 6-24 HRS): SARS Coronavirus 2: NEGATIVE

## 2018-12-02 ENCOUNTER — Encounter: Payer: Self-pay | Admitting: *Deleted

## 2018-12-02 MED ORDER — CEFAZOLIN SODIUM-DEXTROSE 2-4 GM/100ML-% IV SOLN
2.0000 g | Freq: Once | INTRAVENOUS | Status: AC
  Administered 2018-12-03: 2 g via INTRAVENOUS

## 2018-12-03 ENCOUNTER — Inpatient Hospital Stay
Admission: RE | Admit: 2018-12-03 | Discharge: 2018-12-06 | DRG: 470 | Disposition: A | Discharge status: Skilled Nursing Facility | Payer: Medicare Other | Attending: Orthopedic Surgery | Admitting: Orthopedic Surgery

## 2018-12-03 ENCOUNTER — Encounter: Payer: Self-pay | Admitting: Emergency Medicine

## 2018-12-03 ENCOUNTER — Encounter
Admission: RE | Disposition: A | Discharge status: Skilled Nursing Facility | Payer: Self-pay | Source: Home / Self Care | Attending: Orthopedic Surgery

## 2018-12-03 ENCOUNTER — Other Ambulatory Visit: Payer: Self-pay

## 2018-12-03 ENCOUNTER — Inpatient Hospital Stay: Payer: Medicare Other | Admitting: Certified Registered Nurse Anesthetist

## 2018-12-03 ENCOUNTER — Inpatient Hospital Stay: Payer: Medicare Other

## 2018-12-03 DIAGNOSIS — Z8261 Family history of arthritis: Secondary | ICD-10-CM | POA: Diagnosis not present

## 2018-12-03 DIAGNOSIS — Z9181 History of falling: Secondary | ICD-10-CM | POA: Diagnosis not present

## 2018-12-03 DIAGNOSIS — Z96651 Presence of right artificial knee joint: Secondary | ICD-10-CM

## 2018-12-03 DIAGNOSIS — R4182 Altered mental status, unspecified: Secondary | ICD-10-CM | POA: Diagnosis not present

## 2018-12-03 DIAGNOSIS — M1711 Unilateral primary osteoarthritis, right knee: Secondary | ICD-10-CM | POA: Diagnosis present

## 2018-12-03 DIAGNOSIS — Z86711 Personal history of pulmonary embolism: Secondary | ICD-10-CM

## 2018-12-03 DIAGNOSIS — Z8249 Family history of ischemic heart disease and other diseases of the circulatory system: Secondary | ICD-10-CM | POA: Diagnosis not present

## 2018-12-03 DIAGNOSIS — Z833 Family history of diabetes mellitus: Secondary | ICD-10-CM | POA: Diagnosis not present

## 2018-12-03 DIAGNOSIS — F419 Anxiety disorder, unspecified: Secondary | ICD-10-CM | POA: Diagnosis present

## 2018-12-03 DIAGNOSIS — Z823 Family history of stroke: Secondary | ICD-10-CM

## 2018-12-03 DIAGNOSIS — N183 Chronic kidney disease, stage 3 (moderate): Secondary | ICD-10-CM | POA: Diagnosis present

## 2018-12-03 DIAGNOSIS — I420 Dilated cardiomyopathy: Secondary | ICD-10-CM | POA: Diagnosis present

## 2018-12-03 DIAGNOSIS — Z79899 Other long term (current) drug therapy: Secondary | ICD-10-CM | POA: Diagnosis not present

## 2018-12-03 DIAGNOSIS — Z8679 Personal history of other diseases of the circulatory system: Secondary | ICD-10-CM

## 2018-12-03 DIAGNOSIS — Z9884 Bariatric surgery status: Secondary | ICD-10-CM | POA: Diagnosis not present

## 2018-12-03 DIAGNOSIS — Z8262 Family history of osteoporosis: Secondary | ICD-10-CM

## 2018-12-03 DIAGNOSIS — G629 Polyneuropathy, unspecified: Secondary | ICD-10-CM | POA: Diagnosis present

## 2018-12-03 DIAGNOSIS — Z20828 Contact with and (suspected) exposure to other viral communicable diseases: Secondary | ICD-10-CM | POA: Diagnosis present

## 2018-12-03 DIAGNOSIS — E785 Hyperlipidemia, unspecified: Secondary | ICD-10-CM | POA: Diagnosis present

## 2018-12-03 DIAGNOSIS — M238X1 Other internal derangements of right knee: Secondary | ICD-10-CM | POA: Diagnosis present

## 2018-12-03 DIAGNOSIS — D62 Acute posthemorrhagic anemia: Secondary | ICD-10-CM | POA: Diagnosis not present

## 2018-12-03 DIAGNOSIS — F329 Major depressive disorder, single episode, unspecified: Secondary | ICD-10-CM | POA: Diagnosis present

## 2018-12-03 DIAGNOSIS — Z7901 Long term (current) use of anticoagulants: Secondary | ICD-10-CM

## 2018-12-03 DIAGNOSIS — M810 Age-related osteoporosis without current pathological fracture: Secondary | ICD-10-CM | POA: Diagnosis present

## 2018-12-03 DIAGNOSIS — G8918 Other acute postprocedural pain: Secondary | ICD-10-CM

## 2018-12-03 HISTORY — PX: TOTAL KNEE ARTHROPLASTY: SHX125

## 2018-12-03 SURGERY — ARTHROPLASTY, KNEE, TOTAL
Anesthesia: Spinal | Site: Knee | Laterality: Right

## 2018-12-03 MED ORDER — SEVOFLURANE IN SOLN
RESPIRATORY_TRACT | Status: AC
  Filled 2018-12-03: qty 250

## 2018-12-03 MED ORDER — METOCLOPRAMIDE HCL 10 MG PO TABS: 5.0000 mg | ORAL_TABLET | Freq: Three times a day (TID) | ORAL | Status: DC | PRN

## 2018-12-03 MED ORDER — METHOCARBAMOL 500 MG PO TABS
500.0000 mg | ORAL_TABLET | Freq: Four times a day (QID) | ORAL | Status: DC | PRN
  Administered 2018-12-03 – 2018-12-04 (×3): 500 mg via ORAL
  Filled 2018-12-03 (×3): qty 1

## 2018-12-03 MED ORDER — BUPIVACAINE LIPOSOME 1.3 % IJ SUSP
INTRAMUSCULAR | Status: AC
  Filled 2018-12-03: qty 20

## 2018-12-03 MED ORDER — HYDROCODONE-ACETAMINOPHEN 5-325 MG PO TABS: 1.0000 | ORAL_TABLET | ORAL | Status: DC | PRN

## 2018-12-03 MED ORDER — MAGNESIUM HYDROXIDE 400 MG/5ML PO SUSP
30.0000 mL | Freq: Every day | ORAL | Status: DC | PRN
  Filled 2018-12-03: qty 30

## 2018-12-03 MED ORDER — FAMOTIDINE 20 MG PO TABS
ORAL_TABLET | ORAL | Status: AC
  Filled 2018-12-03: qty 1

## 2018-12-03 MED ORDER — SODIUM CHLORIDE (PF) 0.9 % IJ SOLN
INTRAMUSCULAR | Status: AC
  Filled 2018-12-03: qty 50

## 2018-12-03 MED ORDER — CALCITRIOL 0.25 MCG PO CAPS
0.2500 ug | ORAL_CAPSULE | Freq: Every day | ORAL | Status: DC
  Administered 2018-12-04 – 2018-12-06 (×3): 0.25 ug via ORAL
  Filled 2018-12-03 (×4): qty 1

## 2018-12-03 MED ORDER — ZOLPIDEM TARTRATE 5 MG PO TABS: 5.0000 mg | ORAL_TABLET | Freq: Every evening | ORAL | Status: DC | PRN

## 2018-12-03 MED ORDER — METOCLOPRAMIDE HCL 5 MG/ML IJ SOLN: 5.0000 mg | Freq: Three times a day (TID) | INTRAMUSCULAR | Status: DC | PRN

## 2018-12-03 MED ORDER — CALCIUM CARBONATE-VITAMIN D 500-200 MG-UNIT PO TABS
1.0000 | ORAL_TABLET | Freq: Every morning | ORAL | Status: DC
  Administered 2018-12-04 – 2018-12-06 (×3): 1 via ORAL
  Filled 2018-12-03 (×3): qty 1

## 2018-12-03 MED ORDER — PHENOL 1.4 % MT LIQD
1.0000 | OROMUCOSAL | Status: DC | PRN
  Filled 2018-12-03: qty 177

## 2018-12-03 MED ORDER — BUPIVACAINE HCL (PF) 0.5 % IJ SOLN
INTRAMUSCULAR | Status: DC | PRN
  Administered 2018-12-03: 3 mL

## 2018-12-03 MED ORDER — ADULT MULTIVITAMIN W/MINERALS CH
2.0000 | ORAL_TABLET | Freq: Every day | ORAL | Status: DC
  Administered 2018-12-05: 2 via ORAL
  Administered 2018-12-06: 1 via ORAL
  Filled 2018-12-03: qty 2

## 2018-12-03 MED ORDER — ONDANSETRON HCL 4 MG/2ML IJ SOLN
INTRAMUSCULAR | Status: AC
  Filled 2018-12-03: qty 2

## 2018-12-03 MED ORDER — FLUOXETINE HCL 20 MG PO CAPS
60.0000 mg | ORAL_CAPSULE | ORAL | Status: DC
  Administered 2018-12-04 – 2018-12-06 (×3): 60 mg via ORAL
  Filled 2018-12-03 (×4): qty 3

## 2018-12-03 MED ORDER — PREGABALIN 50 MG PO CAPS
100.0000 mg | ORAL_CAPSULE | Freq: Three times a day (TID) | ORAL | Status: DC
  Administered 2018-12-03 – 2018-12-06 (×10): 100 mg via ORAL
  Filled 2018-12-03 (×10): qty 4

## 2018-12-03 MED ORDER — NEOMYCIN-POLYMYXIN B GU 40-200000 IR SOLN
Status: DC | PRN
  Administered 2018-12-03: 14 mL

## 2018-12-03 MED ORDER — BUSPIRONE HCL 10 MG PO TABS
15.0000 mg | ORAL_TABLET | Freq: Two times a day (BID) | ORAL | Status: DC
  Administered 2018-12-03 – 2018-12-06 (×7): 15 mg via ORAL
  Filled 2018-12-03 (×7): qty 2

## 2018-12-03 MED ORDER — PROPOFOL 10 MG/ML IV BOLUS
INTRAVENOUS | Status: DC | PRN
  Administered 2018-12-03: 20 mg via INTRAVENOUS

## 2018-12-03 MED ORDER — CEFAZOLIN SODIUM-DEXTROSE 2-4 GM/100ML-% IV SOLN
2.0000 g | Freq: Four times a day (QID) | INTRAVENOUS | Status: AC
  Administered 2018-12-03: 2 g via INTRAVENOUS
  Filled 2018-12-03 (×2): qty 100

## 2018-12-03 MED ORDER — BUPIVACAINE HCL (PF) 0.5 % IJ SOLN
INTRAMUSCULAR | Status: AC
  Filled 2018-12-03: qty 10

## 2018-12-03 MED ORDER — FAMOTIDINE 20 MG PO TABS
20.0000 mg | ORAL_TABLET | Freq: Once | ORAL | Status: AC
  Administered 2018-12-03: 07:00:00 20 mg via ORAL

## 2018-12-03 MED ORDER — BUPIVACAINE-EPINEPHRINE (PF) 0.25% -1:200000 IJ SOLN
INTRAMUSCULAR | Status: DC | PRN
  Administered 2018-12-03: 30 mL

## 2018-12-03 MED ORDER — FENTANYL CITRATE (PF) 100 MCG/2ML IJ SOLN: 25.0000 ug | INTRAMUSCULAR | Status: DC | PRN

## 2018-12-03 MED ORDER — METHOCARBAMOL 1000 MG/10ML IJ SOLN
500.0000 mg | Freq: Four times a day (QID) | INTRAVENOUS | Status: DC | PRN
  Filled 2018-12-03: qty 5

## 2018-12-03 MED ORDER — LIDOCAINE HCL (PF) 2 % IJ SOLN
INTRAMUSCULAR | Status: AC
  Filled 2018-12-03: qty 10

## 2018-12-03 MED ORDER — ACETAMINOPHEN 10 MG/ML IV SOLN
INTRAVENOUS | Status: AC
  Filled 2018-12-03: qty 100

## 2018-12-03 MED ORDER — SODIUM CHLORIDE 0.9 % IV SOLN
INTRAVENOUS | Status: DC
  Administered 2018-12-03 – 2018-12-04 (×2): via INTRAVENOUS

## 2018-12-03 MED ORDER — PROPOFOL 500 MG/50ML IV EMUL
INTRAVENOUS | Status: AC
  Filled 2018-12-03: qty 50

## 2018-12-03 MED ORDER — SODIUM CHLORIDE 0.9 % IV SOLN
INTRAVENOUS | Status: DC | PRN
  Administered 2018-12-03: 08:00:00 40 mL

## 2018-12-03 MED ORDER — DOCUSATE SODIUM 100 MG PO CAPS
100.0000 mg | ORAL_CAPSULE | Freq: Two times a day (BID) | ORAL | Status: DC
  Administered 2018-12-03 – 2018-12-06 (×5): 100 mg via ORAL
  Filled 2018-12-03 (×6): qty 1

## 2018-12-03 MED ORDER — ONDANSETRON HCL 4 MG/2ML IJ SOLN: 4.0000 mg | Freq: Four times a day (QID) | INTRAMUSCULAR | Status: DC | PRN

## 2018-12-03 MED ORDER — HYDROCODONE-ACETAMINOPHEN 7.5-325 MG PO TABS
1.0000 | ORAL_TABLET | ORAL | Status: DC | PRN
  Administered 2018-12-03: 2 via ORAL
  Administered 2018-12-03 – 2018-12-04 (×3): 1 via ORAL
  Filled 2018-12-03 (×3): qty 1
  Filled 2018-12-03: qty 2
  Filled 2018-12-03: qty 1

## 2018-12-03 MED ORDER — BISACODYL 5 MG PO TBEC: 5.0000 mg | DELAYED_RELEASE_TABLET | Freq: Every day | ORAL | Status: DC | PRN

## 2018-12-03 MED ORDER — DIPHENHYDRAMINE HCL 12.5 MG/5ML PO ELIX: 12.5000 mg | ORAL_SOLUTION | ORAL | Status: DC | PRN

## 2018-12-03 MED ORDER — LACTATED RINGERS IV SOLN
INTRAVENOUS | Status: DC
  Administered 2018-12-03: 07:00:00 via INTRAVENOUS

## 2018-12-03 MED ORDER — VITAMIN B-6 50 MG PO TABS
ORAL_TABLET | Freq: Every day | ORAL | Status: DC
  Administered 2018-12-04 – 2018-12-06 (×3): 50 mg via ORAL
  Filled 2018-12-03 (×4): qty 1

## 2018-12-03 MED ORDER — VALACYCLOVIR HCL 500 MG PO TABS
1000.0000 mg | ORAL_TABLET | Freq: Every day | ORAL | Status: DC | PRN
  Administered 2018-12-03: 22:00:00 1000 mg via ORAL
  Filled 2018-12-03 (×4): qty 2

## 2018-12-03 MED ORDER — ONDANSETRON HCL 4 MG/2ML IJ SOLN: 4.0000 mg | Freq: Once | INTRAMUSCULAR | Status: DC | PRN

## 2018-12-03 MED ORDER — ONDANSETRON HCL 4 MG PO TABS: 4.0000 mg | ORAL_TABLET | Freq: Four times a day (QID) | ORAL | Status: DC | PRN

## 2018-12-03 MED ORDER — MORPHINE SULFATE (PF) 2 MG/ML IV SOLN
0.5000 mg | INTRAVENOUS | Status: DC | PRN
  Administered 2018-12-03: 1 mg via INTRAVENOUS
  Filled 2018-12-03: qty 1

## 2018-12-03 MED ORDER — BUPIVACAINE-EPINEPHRINE (PF) 0.25% -1:200000 IJ SOLN
INTRAMUSCULAR | Status: AC
  Filled 2018-12-03: qty 30

## 2018-12-03 MED ORDER — MORPHINE SULFATE (PF) 10 MG/ML IV SOLN
INTRAVENOUS | Status: AC
  Filled 2018-12-03: qty 1

## 2018-12-03 MED ORDER — CEFAZOLIN SODIUM-DEXTROSE 2-4 GM/100ML-% IV SOLN
INTRAVENOUS | Status: AC
  Filled 2018-12-03: qty 100

## 2018-12-03 MED ORDER — APIXABAN 5 MG PO TABS
5.0000 mg | ORAL_TABLET | Freq: Two times a day (BID) | ORAL | Status: DC
  Administered 2018-12-04 – 2018-12-06 (×5): 5 mg via ORAL
  Filled 2018-12-03 (×5): qty 1

## 2018-12-03 MED ORDER — ACETAMINOPHEN 325 MG PO TABS
325.0000 mg | ORAL_TABLET | Freq: Four times a day (QID) | ORAL | Status: DC | PRN
  Administered 2018-12-04 – 2018-12-05 (×2): 650 mg via ORAL
  Administered 2018-12-05: 325 mg via ORAL
  Administered 2018-12-06: 10:00:00 650 mg via ORAL
  Filled 2018-12-03 (×5): qty 2

## 2018-12-03 MED ORDER — TRAMADOL HCL 50 MG PO TABS
50.0000 mg | ORAL_TABLET | Freq: Four times a day (QID) | ORAL | Status: DC
  Administered 2018-12-03 – 2018-12-04 (×5): 50 mg via ORAL
  Filled 2018-12-03 (×5): qty 1

## 2018-12-03 MED ORDER — EPHEDRINE SULFATE 50 MG/ML IJ SOLN
INTRAMUSCULAR | Status: AC
  Filled 2018-12-03: qty 1

## 2018-12-03 MED ORDER — ONDANSETRON HCL 4 MG/2ML IJ SOLN
INTRAMUSCULAR | Status: DC | PRN
  Administered 2018-12-03: 4 mg via INTRAVENOUS

## 2018-12-03 MED ORDER — ALUM & MAG HYDROXIDE-SIMETH 200-200-20 MG/5ML PO SUSP: 30.0000 mL | ORAL | Status: DC | PRN

## 2018-12-03 MED ORDER — ACETAMINOPHEN 10 MG/ML IV SOLN
INTRAVENOUS | Status: DC | PRN
  Administered 2018-12-03: 1000 mg via INTRAVENOUS

## 2018-12-03 MED ORDER — MAGNESIUM CITRATE PO SOLN
1.0000 | Freq: Once | ORAL | Status: DC | PRN
  Filled 2018-12-03: qty 296

## 2018-12-03 MED ORDER — PHENYLEPHRINE HCL (PRESSORS) 10 MG/ML IV SOLN
INTRAVENOUS | Status: AC
  Filled 2018-12-03: qty 1

## 2018-12-03 MED ORDER — PROPOFOL 500 MG/50ML IV EMUL
INTRAVENOUS | Status: DC | PRN
  Administered 2018-12-03: 50 ug/kg/min via INTRAVENOUS

## 2018-12-03 MED ORDER — MORPHINE SULFATE 10 MG/ML IJ SOLN
INTRAMUSCULAR | Status: DC | PRN
  Administered 2018-12-03: 10 mg

## 2018-12-03 MED ORDER — NEOMYCIN-POLYMYXIN B GU 40-200000 IR SOLN
Status: AC
  Filled 2018-12-03: qty 20

## 2018-12-03 MED ORDER — MENTHOL 3 MG MT LOZG
1.0000 | LOZENGE | OROMUCOSAL | Status: DC | PRN
  Filled 2018-12-03: qty 9

## 2018-12-03 SURGICAL SUPPLY — 70 items
BLADE SAGITTAL 25.0X1.19X90 (BLADE) ×2 IMPLANT
BLADE SAGITTAL 25.0X1.19X90MM (BLADE) ×1
BLADE SAW 90X13X1.19 OSCILLAT (BLADE) ×3 IMPLANT
BLOCK CUTTING FEMUR 3+ RT MED (MISCELLANEOUS) ×3 IMPLANT
BLOCK CUTTING TIBIAL 3 RT (MISCELLANEOUS) ×3 IMPLANT
BLOCK CUTTING TIBIAL 4 RT MIS (MISCELLANEOUS) ×3 IMPLANT
BNDG ELASTIC 6X5.8 VLCR STR LF (GAUZE/BANDAGES/DRESSINGS) ×3 IMPLANT
CANISTER SUCT 1200ML W/VALVE (MISCELLANEOUS) ×3 IMPLANT
CANISTER SUCT 3000ML PPV (MISCELLANEOUS) ×6 IMPLANT
CANISTER WOUND CARE 500ML ATS (WOUND CARE) ×3 IMPLANT
CEMENT HV SMART SET (Cement) ×6 IMPLANT
CHLORAPREP W/TINT 26 (MISCELLANEOUS) ×6 IMPLANT
COOLER POLAR GLACIER W/PUMP (MISCELLANEOUS) ×3 IMPLANT
COVER WAND RF STERILE (DRAPES) ×3 IMPLANT
CUFF TOURN SGL QUICK 24 (TOURNIQUET CUFF)
CUFF TOURN SGL QUICK 30 (TOURNIQUET CUFF) ×2
CUFF TRNQT CYL 24X4X16.5-23 (TOURNIQUET CUFF) IMPLANT
CUFF TRNQT CYL 30X4X21-28X (TOURNIQUET CUFF) ×1 IMPLANT
DRAPE 3/4 80X56 (DRAPES) ×6 IMPLANT
ELECT CAUTERY BLADE 6.4 (BLADE) ×3 IMPLANT
ELECT REM PT RETURN 9FT ADLT (ELECTROSURGICAL) ×3
ELECTRODE REM PT RTRN 9FT ADLT (ELECTROSURGICAL) ×1 IMPLANT
FEMORAL COMP SZ3P RT SPHERE (Femur) ×3 IMPLANT
FEMUR BONE MODEL (MISCELLANEOUS) ×3 IMPLANT
GAUZE SPONGE 4X4 12PLY STRL (GAUZE/BANDAGES/DRESSINGS) ×3 IMPLANT
GAUZE XEROFORM 1X8 LF (GAUZE/BANDAGES/DRESSINGS) ×3 IMPLANT
GLOVE BIOGEL PI IND STRL 9 (GLOVE) ×1 IMPLANT
GLOVE BIOGEL PI INDICATOR 9 (GLOVE) ×2
GLOVE INDICATOR 8.0 STRL GRN (GLOVE) ×3 IMPLANT
GLOVE SURG ORTHO 8.0 STRL STRW (GLOVE) ×3 IMPLANT
GLOVE SURG SYN 9.0  PF PI (GLOVE) ×2
GLOVE SURG SYN 9.0 PF PI (GLOVE) ×1 IMPLANT
GOWN SRG 2XL LVL 4 RGLN SLV (GOWNS) ×1 IMPLANT
GOWN STRL NON-REIN 2XL LVL4 (GOWNS) ×2
GOWN STRL REUS W/ TWL LRG LVL3 (GOWN DISPOSABLE) ×1 IMPLANT
GOWN STRL REUS W/ TWL XL LVL3 (GOWN DISPOSABLE) ×1 IMPLANT
GOWN STRL REUS W/TWL LRG LVL3 (GOWN DISPOSABLE) ×2
GOWN STRL REUS W/TWL XL LVL3 (GOWN DISPOSABLE) ×2
HOLDER FOLEY CATH W/STRAP (MISCELLANEOUS) ×3 IMPLANT
HOOD PEEL AWAY FLYTE STAYCOOL (MISCELLANEOUS) ×6 IMPLANT
INSERT TIBIAL FIXED 14MM RT (Insert) ×3 IMPLANT
KIT PREVENA INCISION MGT20CM45 (CANNISTER) ×3 IMPLANT
KIT TURNOVER KIT A (KITS) ×3 IMPLANT
NDL SAFETY ECLIPSE 18X1.5 (NEEDLE) ×1 IMPLANT
NEEDLE HYPO 18GX1.5 SHARP (NEEDLE) ×2
NEEDLE SPNL 18GX3.5 QUINCKE PK (NEEDLE) ×3 IMPLANT
NEEDLE SPNL 20GX3.5 QUINCKE YW (NEEDLE) ×3 IMPLANT
NS IRRIG 1000ML POUR BTL (IV SOLUTION) ×3 IMPLANT
PACK TOTAL KNEE (MISCELLANEOUS) ×3 IMPLANT
PAD WRAPON POLAR KNEE (MISCELLANEOUS) ×1 IMPLANT
PATELLA RESURFACING MEDACTA 02 (Bone Implant) ×3 IMPLANT
PULSAVAC PLUS IRRIG FAN TIP (DISPOSABLE) ×3
SCALPEL PROTECTED #10 DISP (BLADE) ×6 IMPLANT
SOL .9 NS 3000ML IRR  AL (IV SOLUTION) ×2
SOL .9 NS 3000ML IRR UROMATIC (IV SOLUTION) ×1 IMPLANT
STAPLER SKIN PROX 35W (STAPLE) ×3 IMPLANT
STEM EXTENSION 11MMX30MM (Stem) ×3 IMPLANT
SUCTION FRAZIER HANDLE 10FR (MISCELLANEOUS) ×2
SUCTION TUBE FRAZIER 10FR DISP (MISCELLANEOUS) ×1 IMPLANT
SUT DVC 2 QUILL PDO  T11 36X36 (SUTURE) ×2
SUT DVC 2 QUILL PDO T11 36X36 (SUTURE) ×1 IMPLANT
SUT V-LOC 90 ABS DVC 3-0 CL (SUTURE) ×3 IMPLANT
SYR 20ML LL LF (SYRINGE) ×3 IMPLANT
SYR 50ML LL SCALE MARK (SYRINGE) ×6 IMPLANT
TIP FAN IRRIG PULSAVAC PLUS (DISPOSABLE) ×1 IMPLANT
TOWEL OR 17X26 4PK STRL BLUE (TOWEL DISPOSABLE) ×3 IMPLANT
TOWER CARTRIDGE SMART MIX (DISPOSABLE) ×3 IMPLANT
TRAY FOLEY MTR SLVR 16FR STAT (SET/KITS/TRAYS/PACK) ×3 IMPLANT
TRAY TIB FX RT SZ 3 (Joint) ×3 IMPLANT
WRAPON POLAR PAD KNEE (MISCELLANEOUS) ×3

## 2018-12-03 NOTE — Anesthesia Preprocedure Evaluation (Addendum)
Anesthesia Evaluation  Patient identified by MRN, date of birth, ID band Patient awake    Reviewed: Allergy & Precautions, NPO status , Patient's Chart, lab work & pertinent test results  History of Anesthesia Complications Negative for: history of anesthetic complications  Airway Mallampati: II  TM Distance: >3 FB Neck ROM: Full    Dental  (+) Poor Dentition   Pulmonary sleep apnea , neg COPD, PE: hx of PE on eliquis, last dose >72h ago.   breath sounds clear to auscultation- rhonchi (-) wheezing      Cardiovascular hypertension, Pt. 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  Takes Vasotec for kidneys.-stage 3 No date: Depression No date: Hyperlipemia No date: Hypertension     Comment:  h/o had gastric bypass and was able to come off meds No date: Pulmonary embolism (HCC) No date: Sleep apnea     Comment:  no cpap since her gastric bypass   Reproductive/Obstetrics                             Lab Results  Component Value Date   WBC 6.3 11/28/2018   HGB 11.4 (L) 11/28/2018   HCT 35.1 (L) 11/28/2018   MCV 92.1 11/28/2018   PLT 256 11/28/2018    Anesthesia Physical Anesthesia Plan  ASA: III  Anesthesia Plan: Spinal   Post-op Pain Management:     Induction:   PONV Risk Score and Plan: 2 and Ondansetron and Propofol infusion  Airway Management Planned: Natural Airway  Additional Equipment:   Intra-op Plan:   Post-operative Plan:   Informed Consent: I have reviewed the patients History and Physical, chart, labs and discussed the procedure including the risks, benefits and alternatives for the proposed anesthesia with the patient or authorized representative who has indicated his/her understanding and acceptance.     Dental advisory given  Plan Discussed with: CRNA and Anesthesiologist  Anesthesia Plan Comments:         Anesthesia Quick Evaluation

## 2018-12-03 NOTE — Op Note (Signed)
12/03/2018  9:10 AM  PATIENT:  Ann Russell  78 y.o. female  PRE-OPERATIVE DIAGNOSIS:  PRIMARY OSTEOARTHRITIS OF RIGHT KNEE  POST-OPERATIVE DIAGNOSIS:  PRIMARY OSTEOARTHRITIS OF RIGHT KNEE  PROCEDURE:  Procedure(s): RIGHT TOTAL KNEE ARTHROPLASTY (Right)  SURGEON: Laurene Footman, MD  ASSISTANTS: Rachelle Hora, PA-C  ANESTHESIA:   spinal  EBL:  Total I/O In: 450 [I.V.:450] Out: 200 [Urine:150; Blood:50]  BLOOD ADMINISTERED:none  DRAINS: none   LOCAL MEDICATIONS USED:  MARCAINE    and OTHER morphine and Exparel  SPECIMEN:  No Specimen  DISPOSITION OF SPECIMEN:  N/A  COUNTS:  YES  TOURNIQUET:   Total Tourniquet Time Documented: Thigh (Right) - 62 minutes Total: Thigh (Right) - 62 minutes   IMPLANTS: Medacta GMK sphere system with 3+ femur right, 3 tibia right, 14 mm insert with short stem and 2 patella, all components cemented  DICTATION: .Dragon Dictation  patient was brought to the operating room and after adequatespinalanesthesia was obtained theright leg was prepped and draped in the usual sterile fashion. After patient identification and timeout procedures were completed, midline skin incision was made followed by medial parapatellar arthrotomy after raising tourniquetthere is exposed bone throughout the entire medial and lateral femoral condyles withseverepatellofemoral andsignificant wear to the posterior lateral tibial condyle with exposed bone. The ACL and fat pad were excised off the anterior into the meniscus in the proximal tibia cutting guide from the [facility] was applied proximal tibia cut carried out. The distal femoral cut was carried out in a similar fashion and the3+cutting guide applied with anterior posterior and chamfer cuts made. The posterior horns of the menisci were removed at this point. The3tibia baseplate trial was placed pinned into position and proximal tibial preparation carried out with drilling hand reaming and the keel  punch followed by placement of the3+femur and sizing the tibial insert size 14 gave the best fit with stability and full extension. The distal femoral drill holes were made in the notch cut for the trochlear groove was then carried out with trials were then removed the patella was cut using the patellar cutting guide and it sized to a size2, afterdrill holes have been made.At this point the above local was infiltrated in the periarticular tissues and periosteum. The knee was irrigated with pulsatile lavage and the bony surfaces dried the tibial component was cemented into place first. Excess cement was removed and the polyethylene insert placed with a torque screw placed with a torque screwdriver tightened. The distal femoral component was placed and the knee was held in extension as the patellar button was clamped into place. After the cement was set excess cement was removed and the knee was again irrigated thoroughly thoroughly irrigated. The tourniquet was let down and hemostasis checked electrocautery.  A lateral release was required to get the patella to stay in the midline. The arthrotomy was repaired with a heavy Quill suture,  followed by 3-0 V lock subcuticular closure,skin staples,incisional wound VAC  and Polar Care  PLAN OF CARE: Admit to inpatient   PATIENT DISPOSITION:  PACU - hemodynamically stable.

## 2018-12-03 NOTE — H&P (Signed)
Reviewed paper H+P, will be scanned into chart. No changes noted.  

## 2018-12-03 NOTE — Anesthesia Post-op Follow-up Note (Signed)
Anesthesia QCDR form completed.        

## 2018-12-03 NOTE — Evaluation (Addendum)
Physical Therapy Evaluation Patient Details Name: Ann Russell MRN: [id number] DOB: [birth year] Today's Date: 12/03/2018   History of Present Illness  Pt admitted for R TKR. History includes HTN, sleep apnea, and depression. 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  Clinical Impression  Pt is a pleasant 78 year old female who was admitted for R TKR. Overall limited functional independence PTA.  Pt performs bed mobility with mod assist, however unable to tolerate sitting EOB/dangle due to increased pain, returned supine. Further OOB mobility not performed. Pt demonstrates deficits with strength/mobility/pain. Due to decreased strength, recommend KI for OOB WBing. Pt with increased edema on R knee, polar care donned. Pt motivated to participate and hopeful for quick improvement. Would benefit from skilled PT to address above deficits and promote optimal return to PLOF. Recommend transition to [facility] upon discharge from acute hospitalization.     Follow Up Recommendations Home health PT;Supervision/Assistance - 24 hour    Equipment Recommendations  Rolling walker with 5" wheels    Recommendations for Other Services       Precautions / Restrictions Precautions Precautions: Fall;Knee Precaution Booklet Issued: No Restrictions Weight Bearing Restrictions: Yes RLE Weight Bearing: Weight bearing as tolerated      Mobility  Bed Mobility Overal bed mobility: Needs Assistance Bed Mobility: Supine to Sit     Supine to sit: Mod assist     General bed mobility comments: needs assist for sequencing and initiation of movement. Unable to tolerate R knee flexion and foot flat on floor secondary to pain. Unable to tolerate seated at EOB and requested to return back supine. Needed assist for repositioning in bed  Transfers                 General transfer comment: unable  Ambulation/Gait                Stairs             Wheelchair Mobility    Modified Rankin (Stroke Patients Only)       Balance Overall balance assessment: Needs assistance;History of Falls Sitting-balance support: Bilateral upper extremity supported Sitting balance-Leahy Scale: Fair                                       Pertinent Vitals/Pain Pain Assessment: 0-10 Pain Score: 8  Pain Location: R knee Pain Descriptors / Indicators: Operative site guarding Pain Intervention(s): Limited activity within patient's tolerance;Patient requesting pain meds-RN notified;RN gave pain meds during session;Ice applied    Home Living Family/patient expects to be discharged to:: Private residence Living Arrangements: Non-relatives/Friends Available Help at Discharge: Family;Available 24 hours/day Type of Home: House Home Access: Ramped entrance     Home Layout: One level Home Equipment: Walker - 4 wheels;Bedside commode;Shower seat;Transport chair(bed rail)      Prior Function Level of Independence: Independent with assistive device(s)         Comments: was able to complete ADLs with rollater. Limited household distances performed with multiple falls noted     Hand Dominance        Extremity/Trunk Assessment   Upper Extremity Assessment Upper Extremity Assessment: Overall WFL for tasks assessed    Lower Extremity Assessment Lower Extremity Assessment: Generalized weakness(L LE grossly 4/5; R LE grossly 2/5)       Communication   Communication: No difficulties  Cognition Arousal/Alertness:  Awake/alert Behavior During Therapy: WFL for tasks assessed/performed Overall Cognitive Status: Within Functional Limits for tasks assessed                                        General Comments      Exercises Total Joint Exercises Goniometric ROM: 10-48 degrees R knee AAROM Other Exercises Other Exercises: Supine ther-ex performed on R LE including quad sets, SLRs, and hip abd/add. Unable to  perform active AP at baseline. 10 reps with mod assist.   Assessment/Plan    PT Assessment Patient needs continued PT services  PT Problem List Decreased strength;Decreased range of motion;Decreased activity tolerance;Decreased balance;Decreased mobility;Pain       PT Treatment Interventions DME instruction;Gait training;Therapeutic exercise;Balance training    PT Goals (Current goals can be found in the Care Plan section)  Acute Rehab PT Goals Patient Stated Goal: to go home PT Goal Formulation: With patient Time For Goal Achievement: 12/17/18 Potential to Achieve Goals: Good    Frequency BID   Barriers to discharge        Co-evaluation               AM-PAC PT "6 Clicks" Mobility  Outcome Measure Help needed turning from your back to your side while in a flat bed without using bedrails?: A Little Help needed moving from lying on your back to sitting on the side of a flat bed without using bedrails?: A Lot Help needed moving to and from a bed to a chair (including a wheelchair)?: Total Help needed standing up from a chair using your arms (e.g., wheelchair or bedside chair)?: Total Help needed to walk in hospital room?: Total Help needed climbing 3-5 steps with a railing? : Total 6 Click Score: 9    End of Session Equipment Utilized During Treatment: Gait belt Activity Tolerance: Patient limited by pain Patient left: in bed;with bed alarm set;with SCD's reapplied Nurse Communication: Mobility status PT Visit Diagnosis: Muscle weakness (generalized) (M62.81);Difficulty in walking, not elsewhere classified (R26.2);Pain Pain - Right/Left: Right Pain - part of body: Knee    Time: 1415-1446 PT Time Calculation (min) (ACUTE ONLY): 31 min   Charges:   PT Evaluation $PT Eval Moderate Complexity: 1 Mod PT Treatments $Therapeutic Exercise: 8-22 mins        Greggory Stallion, PT, DPT [phone]   Unknown Flannigan 12/03/2018, 4:09 PM

## 2018-12-03 NOTE — Anesthesia Procedure Notes (Addendum)
Spinal  Patient location during procedure: OR Start time: 12/03/2018 7:20 AM End time: 12/03/2018 7:25 AM Staffing Anesthesiologist: Emmie Niemann, MD Resident/CRNA: Eben Burow, CRNA Performed: resident/CRNA  Preanesthetic Checklist Completed: patient identified, site marked, surgical consent, pre-op evaluation, timeout performed, IV checked, risks and benefits discussed and monitors and equipment checked Spinal Block Patient position: sitting Prep: ChloraPrep and site prepped and draped Patient monitoring: heart rate, continuous pulse ox and blood pressure Approach: midline Location: L3-4 Injection technique: single-shot Needle Needle type: Introducer and Pencan  Needle gauge: 24 G Needle length: 9 cm Assessment Sensory level: T4

## 2018-12-03 NOTE — Transfer of Care (Signed)
Immediate Anesthesia Transfer of Care Note  Patient: Ann Russell  Procedure(s) Performed: RIGHT TOTAL KNEE ARTHROPLASTY (Right Knee)  Patient Location: PACU  Anesthesia Type:Spinal  Level of Consciousness: awake, alert , oriented and patient cooperative  Airway & Oxygen Therapy: Patient Spontanous Breathing  Post-op Assessment: Report given to RN and Post -op Vital signs reviewed and stable  Post vital signs: Reviewed and stable  Last Vitals:  Vitals Value Taken Time  BP 108/51 12/03/18 0910  Temp 36.8 C 12/03/18 0910  Pulse 71 12/03/18 0912  Resp 10 12/03/18 0912  SpO2 99 % 12/03/18 0912  Vitals shown include unvalidated device data.  Last Pain:  Vitals:   12/03/18 0627  TempSrc: Tympanic  PainSc: 0-No pain         Complications: No apparent anesthesia complications

## 2018-12-04 ENCOUNTER — Encounter: Payer: Self-pay | Admitting: Orthopedic Surgery

## 2018-12-04 LAB — CBC
HCT: 30.7 % — ABNORMAL LOW (ref 36.0–46.0)
Hemoglobin: 9.8 g/dL — ABNORMAL LOW (ref 12.0–15.0)
MCH: 29.5 pg (ref 26.0–34.0)
MCHC: 31.9 g/dL (ref 30.0–36.0)
MCV: 92.5 fL (ref 80.0–100.0)
Platelets: 219 10*3/uL (ref 150–400)
RBC: 3.32 MIL/uL — ABNORMAL LOW (ref 3.87–5.11)
RDW: 15.9 % — ABNORMAL HIGH (ref 11.5–15.5)
WBC: 6.1 10*3/uL (ref 4.0–10.5)
nRBC: 0 % (ref 0.0–0.2)

## 2018-12-04 LAB — BASIC METABOLIC PANEL
Anion gap: 5 (ref 5–15)
BUN: 37 mg/dL — ABNORMAL HIGH (ref 8–23)
CO2: 24 mmol/L (ref 22–32)
Calcium: 8.4 mg/dL — ABNORMAL LOW (ref 8.9–10.3)
Chloride: 103 mmol/L (ref 98–111)
Creatinine, Ser: 1.43 mg/dL — ABNORMAL HIGH (ref 0.44–1.00)
GFR calc Af Amer: 41 mL/min — ABNORMAL LOW (ref >60)
GFR calc non Af Amer: 35 mL/min — ABNORMAL LOW (ref >60)
Glucose, Bld: 109 mg/dL — ABNORMAL HIGH (ref 70–99)
Potassium: 4.3 mmol/L (ref 3.5–5.1)
Sodium: 132 mmol/L — ABNORMAL LOW (ref 135–145)

## 2018-12-04 MED ORDER — FE FUMARATE-B12-VIT C-FA-IFC PO CAPS
1.0000 | ORAL_CAPSULE | Freq: Two times a day (BID) | ORAL | Status: DC
  Administered 2018-12-04 – 2018-12-06 (×4): 1 via ORAL
  Filled 2018-12-04 (×6): qty 1

## 2018-12-04 NOTE — Progress Notes (Addendum)
Physical Therapy Treatment Patient Details Name: Ann Russell MRN: [id number] DOB: 15-Apr-1941 Today's Date: 12/04/2018    History of Present Illness Pt admitted for R TKR. History includes HTN, sleep apnea, and depression. 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    PT Comments    Pt is making limited progress towards goals secondary to pain. Pt is premedicated prior to therapy, however continues to report pain at 8-10/10 with any movement. Pt is very fearful and needs lots of encouragement for participation. Limited ROM noted secondary to pain and pt saying "stop stop stop" "I can't do this". Limited there-ex performed. 2 standing attempts made with RW. Will continue to progress as able. Updating recommendation to SNF as pt is not safe to return home.  Follow Up Recommendations  SNF     Equipment Recommendations  Rolling walker with 5" wheels    Recommendations for Other Services       Precautions / Restrictions Precautions Precautions: Fall;Knee Precaution Booklet Issued: Yes (comment) Restrictions Weight Bearing Restrictions: Yes RLE Weight Bearing: Weight bearing as tolerated    Mobility  Bed Mobility Overal bed mobility: Needs Assistance Bed Mobility: Supine to Sit     Supine to sit: Max assist;+2 for physical assistance     General bed mobility comments: pt very fearful of movement, however agreeable to attempt for bed mobility. Slow transfer with heavy cues required. Once seated at EOB, able to sit with supervision. KI donned for mobility.  Transfers Overall transfer level: Needs assistance Equipment used: Rolling walker (2 wheeled) Transfers: Sit to/from Stand Sit to Stand: Max assist;From elevated surface;+2 physical assistance         General transfer comment: 2 attempts to stand with 10' tolerance each time. RW used with minimal WBing noted on R LE. Forward flexed posture  Ambulation/Gait              General Gait Details: unable   Stairs             Wheelchair Mobility    Modified Rankin (Stroke Patients Only)       Balance Overall balance assessment: Needs assistance;History of Falls Sitting-balance support: Bilateral upper extremity supported Sitting balance-Leahy Scale: Good     Standing balance support: Bilateral upper extremity supported Standing balance-Leahy Scale: Fair                              Cognition Arousal/Alertness: Awake/alert Behavior During Therapy: WFL for tasks assessed/performed Overall Cognitive Status: Within Functional Limits for tasks assessed                                        Exercises Total Joint Exercises Goniometric ROM: 3-45 degrees R knee AAROM Other Exercises Other Exercises: Supine ther-ex performed on R LE including limited AP, quad sets, SLR, SAQ, and hip abd/add. mod/max assist. Also performed ther-ex on L LE with cga    General Comments        Pertinent Vitals/Pain Pain Assessment: 0-10 Pain Score: 10-Worst pain ever Pain Location: R knee Pain Descriptors / Indicators: Operative site guarding Pain Intervention(s): Limited activity within patient's tolerance;Repositioned;Ice applied    Home Living                      Prior Function  PT Goals (current goals can now be found in the care plan section) Acute Rehab PT Goals Patient Stated Goal: to go home PT Goal Formulation: With patient Time For Goal Achievement: 12/17/18 Potential to Achieve Goals: Good Progress towards PT goals: Progressing toward goals    Frequency    BID      PT Plan Discharge plan needs to be updated    Co-evaluation              AM-PAC PT "6 Clicks" Mobility   Outcome Measure  Help needed turning from your back to your side while in a flat bed without using bedrails?: A Little Help needed moving from lying on your back to sitting on the side of a flat bed without  using bedrails?: A Lot Help needed moving to and from a bed to a chair (including a wheelchair)?: Total Help needed standing up from a chair using your arms (e.g., wheelchair or bedside chair)?: Total Help needed to walk in hospital room?: Total Help needed climbing 3-5 steps with a railing? : Total 6 Click Score: 9    End of Session Equipment Utilized During Treatment: Gait belt;Right knee immobilizer Activity Tolerance: Patient limited by pain Patient left: in bed;with bed alarm set;with SCD's reapplied Nurse Communication: Mobility status PT Visit Diagnosis: Muscle weakness (generalized) (M62.81);Difficulty in walking, not elsewhere classified (R26.2);Pain Pain - Right/Left: Right Pain - part of body: Knee     Time: 0930-1010 PT Time Calculation (min) (ACUTE ONLY): 40 min  Charges:  $Therapeutic Exercise: 23-37 mins $Therapeutic Activity: 8-22 mins                     Greggory Stallion, PT, DPT [phone]    Ann Russell 12/04/2018, 10:52 AM

## 2018-12-04 NOTE — Progress Notes (Signed)
Physical Therapy Treatment Patient Details Name: Ann Russell MRN: [id number] DOB: February 26, 1941 Today's Date: 12/04/2018    History of Present Illness Pt admitted for R TKR. History includes HTN, sleep apnea, and depression. 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    PT Comments    Pt is making limited progress towards goals secondary to intense pain with any movement. Pt appears confused this session, trying to get OOB upon arrival and pulling at IV cord and wound vac. Notified RN with her coming to assess. Pt easy to re-orient, however does get upset and is aware that she is confused. Has difficulty following commands this session and continues to be fearful of movement. Needs +2 for all mobility and unable to ambulate or transfer to recliner this date. KI donned for mobility. Will continue to progress as able.   Follow Up Recommendations  SNF     Equipment Recommendations  Rolling walker with 5" wheels    Recommendations for Other Services       Precautions / Restrictions Precautions Precautions: Fall;Knee Precaution Booklet Issued: Yes (comment) Restrictions Weight Bearing Restrictions: Yes RLE Weight Bearing: Weight bearing as tolerated    Mobility  Bed Mobility Overal bed mobility: Needs Assistance Bed Mobility: Supine to Sit     Supine to sit: Max assist;+2 for physical assistance     General bed mobility comments: needs heavy encouragement for participation. Assist for sitting at EOB with difficulty letting therapist lower surgical leg to ground secondary to intense pain. KI donned prior to all mobility. Able to sit with supervision once at EOB  Transfers Overall transfer level: Needs assistance Equipment used: Rolling walker (2 wheeled) Transfers: Sit to/from Stand Sit to Stand: Max assist;From elevated surface;+2 physical assistance         General transfer comment: 3 standing attempts performed with RW. Heavy  assist required with therapist providing tactile cues for L knee extension and +2 for sliding R LE under BOS once upright. Pt able to improved endurance with each attempt lasting approx 20 seconds in standing prior to fatigue on last attempt. Pt refused further mobility efforts due to fatigue/pain  Ambulation/Gait             General Gait Details: unable   Stairs             Wheelchair Mobility    Modified Rankin (Stroke Patients Only)       Balance Overall balance assessment: Needs assistance;History of Falls Sitting-balance support: Bilateral upper extremity supported Sitting balance-Leahy Scale: Good     Standing balance support: Bilateral upper extremity supported Standing balance-Leahy Scale: Fair                              Cognition Arousal/Alertness: Awake/alert Behavior During Therapy: Anxious Overall Cognitive Status: Impaired/Different from baseline Area of Impairment: Attention;Memory;Safety/judgement;Awareness;Problem solving                               General Comments: pt very confused this shift. Notified AD and RN to assess      Exercises Other Exercises Other Exercises: Supine ther-ex performed on R LE including limited AP, SLR, SAQ, and hip abd/add. mod/max assist.  Other Exercises: performed rolling to B sides with mod assist +2 for bed pan placement and voiding. Needed total assist for hygiene.    General  Comments        Pertinent Vitals/Pain Pain Assessment: 0-10 Pain Score: 7  Pain Location: R knee Pain Descriptors / Indicators: Operative site guarding Pain Intervention(s): Limited activity within patient's tolerance;Repositioned;Patient requesting pain meds-RN notified;Ice applied    Home Living                      Prior Function            PT Goals (current goals can now be found in the care plan section) Acute Rehab PT Goals Patient Stated Goal: to go home PT Goal Formulation: With  patient Time For Goal Achievement: 12/17/18 Potential to Achieve Goals: Good Progress towards PT goals: Progressing toward goals    Frequency    BID      PT Plan Current plan remains appropriate    Co-evaluation              AM-PAC PT "6 Clicks" Mobility   Outcome Measure  Help needed turning from your back to your side while in a flat bed without using bedrails?: A Little Help needed moving from lying on your back to sitting on the side of a flat bed without using bedrails?: A Lot Help needed moving to and from a bed to a chair (including a wheelchair)?: Total Help needed standing up from a chair using your arms (e.g., wheelchair or bedside chair)?: Total Help needed to walk in hospital room?: Total Help needed climbing 3-5 steps with a railing? : Total 6 Click Score: 9    End of Session Equipment Utilized During Treatment: Gait belt;Right knee immobilizer Activity Tolerance: Patient limited by pain Patient left: in bed;with bed alarm set;with SCD's reapplied Nurse Communication: Mobility status PT Visit Diagnosis: Muscle weakness (generalized) (M62.81);Difficulty in walking, not elsewhere classified (R26.2);Pain Pain - Right/Left: Right Pain - part of body: Knee     Time: 7353-2992 PT Time Calculation (min) (ACUTE ONLY): 46 min  Charges:  $Therapeutic Exercise: 8-22 mins $Therapeutic Activity: 23-37 mins                     Greggory Stallion, PT, DPT [phone]    Kristopher Attwood 12/04/2018, 2:52 PM

## 2018-12-04 NOTE — Progress Notes (Signed)
OT Cancellation Note  Patient Details Name: Ann Russell MRN: [id number] DOB: Jul 05, 1940   Cancelled Treatment:    Reason Eval/Treat Not Completed: Medical issues which prohibited therapy. Attempt x3 to see this pt this date. Per physical therapist and RN, pt is experiencing increased confusion on this PM. RN stated she suspects pain medication impacting cognition. Pt is not appropriate for OT evaluation. Will hold and re-attempt next date as pt cognition improves and she is better able to participate in OT evaluation.   Shara Blazing, M.S., OTR/L Ascom: [phone] 12/04/18, 3:13 PM

## 2018-12-04 NOTE — Anesthesia Postprocedure Evaluation (Signed)
Anesthesia Post Note  Patient: Ann Russell  Procedure(s) Performed: RIGHT TOTAL KNEE ARTHROPLASTY (Right Knee)  Patient location during evaluation: Nursing Unit Anesthesia Type: Spinal Level of consciousness: awake and alert, patient cooperative and confused Pain management: pain level not controlled Vital Signs Assessment: post-procedure vital signs reviewed and stable Respiratory status: spontaneous breathing and respiratory function stable Cardiovascular status: blood pressure returned to baseline and stable Postop Assessment: no headache, no backache, no apparent nausea or vomiting and patient able to bend at knees Anesthetic complications: no Comments: RN made aware of patient pain and confusion.     Last Vitals:  Vitals:   12/04/18 0009 12/04/18 0523  BP: 115/65 139/68  Pulse: 62 69  Resp: 17 18  Temp: 36.9 C 36.7 C  SpO2: 97% 98%    Last Pain:  Vitals:   12/04/18 0529  TempSrc:   PainSc: 7                  Caryl Asp

## 2018-12-04 NOTE — TOC Initial Note (Signed)
Transition of Care [facility]) - Initial/Assessment Note    Patient Details  Name: Ann Russell MRN: [id number] Date of Birth: 12-10-1940  Transition of [facility]) CM/SW Contact:    Su Hilt, RN Phone Number: 12/04/2018, 3:17 PM  Clinical Narrative:                  Met with the patient to discuss DC plan and needs She lives at Home with her Pitkin and he will be providing care and transportation She has a Rollator and BSC at hopme but would benefit from a RW I notified Brad with [employer], she has used [facility] in the past and would like to use them again, Cassie verified and accepted the patient. The patient refuses to go to rehab facility and wants to go home Requested the price of Lovenox and will notify the patient of the cost once obtained Expected Discharge Plan: [place] Barriers to Discharge: Continued Medical Work up   Patient Goals and CMS Choice Patient states their goals for this hospitalization and ongoing recovery are:: Go home CMS Medicare.gov Compare Post Acute Care list provided to:: Patient Choice offered to / list presented to : Patient  Expected Discharge Plan and Services Expected Discharge Plan: [place]   Discharge Planning Services: CM Consult Post Acute Care Choice: [place] arrangements for the past 2 months: Single Family Home Expected Discharge Date: 12/05/18               DME Arranged: Gilford Rile rolling DME Agency: [employer] Date DME Agency Contacted: 12/04/18 Time DME Agency Contacted: [phone] Representative spoke with at DME Agency: [place]: PT [place] Date [place]: 12/04/18 Time [place]: [id number] Representative spoke with at [place] Arrangements/Services Living arrangements for the past 2 months: [place] with:: Significant Other Patient language and need for interpreter reviewed:: No Do you feel  safe going back to the place where you live?: Yes      Need for Family Participation in Patient Care: No (Comment) Care giver support system in place?: Yes (comment) Current home services: DME(Rollator) Criminal Activity/Legal Involvement Pertinent to Current Situation/Hospitalization: No - Comment as needed  Activities of Daily Living Home Assistive Devices/Equipment: Eyeglasses, Environmental consultant (specify type), Wheelchair ADL Screening (condition at time of admission) Patient's cognitive ability adequate to safely complete daily activities?: Yes Is the patient deaf or have difficulty hearing?: No Does the patient have difficulty seeing, even when wearing glasses/contacts?: No Does the patient have difficulty concentrating, remembering, or making decisions?: No Patient able to express need for assistance with ADLs?: Yes Does the patient have difficulty dressing or bathing?: Yes Independently performs ADLs?: No Does the patient have difficulty walking or climbing stairs?: Yes Weakness of Legs: Right Weakness of Arms/Hands: None  Permission Sought/Granted   Permission granted to share information with : Yes, Verbal Permission Granted              Emotional Assessment Appearance:: Appears stated age Attitude/Demeanor/Rapport: Engaged Affect (typically observed): Appropriate, Calm Orientation: : Oriented to Self, Oriented to Place, Oriented to  Time, Oriented to Situation Alcohol / Substance Use: Not Applicable Psych Involvement: No (comment)  Admission diagnosis:  PRIMARY OSTEOARTHRITIS OF RIGHT KNEE Patient Active Problem List   Diagnosis Date Noted  . 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 Shoulder pain, acute 07/27/2017  . Hypertension 07/24/2017  .  Pulmonary embolism ([place]) 06/21/2017  . 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 [address]Phone: [phone]: [phone] [facility]Phone: [phone]: [address]Phone: [phone]: [phone]    Social Determinants of Health (SDOH) Interventions    Readmission Risk Interventions No flowsheet data found.

## 2018-12-04 NOTE — Progress Notes (Signed)
   Subjective: 1 Day Post-Op Procedure(s) (LRB): RIGHT TOTAL KNEE ARTHROPLASTY (Right) Patient reports pain as 10 on 0-10 scale.  No PRN pain medication in 12 hrs. Patient is well, and has had no acute complaints or problems Denies any CP, SOB, ABD pain. We will continue therapy today.  Plan is to go Home after hospital stay.  Objective: Vital signs in last 24 hours: Temp:  [97.5 F (36.4 C)-98.7 F (37.1 C)] 98.7 F (37.1 C) (08/12 0750) Pulse Rate:  [51-71] 70 (08/12 0750) Resp:  [10-20] 18 (08/12 0750) BP: (98-147)/(47-77) 147/67 (08/12 0750) SpO2:  [97 %-100 %] 98 % (08/12 0750)  Intake/Output from previous day: 08/11 0701 - 08/12 0700 In: 1174.6 [P.O.:520; I.V.:609.7; IV Piggyback:44.9] Out: 1385 [Urine:1335; Blood:50] Intake/Output this shift: No intake/output data recorded.  Recent Labs    12/04/18 0512  HGB 9.8*   Recent Labs    12/04/18 0512  WBC 6.1  RBC 3.32*  HCT 30.7*  PLT 219   Recent Labs    12/04/18 0512  NA 132*  K 4.3  CL 103  CO2 24  BUN 37*  CREATININE 1.43*  GLUCOSE 109*  CALCIUM 8.4*   No results for input(s): LABPT, INR in the last 72 hours.  EXAM General - Patient is Alert, Appropriate and Oriented Extremity - Neurovascular intact Sensation intact distally Intact pulses distally Dorsiflexion/Plantar flexion intact No cellulitis present Compartment soft Dressing - dressing C/D/I and no drainage Motor Function - intact, moving foot and toes well on exam.   Past Medical History:  Diagnosis Date  . Arthritis    hands  . 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 Depression   . Hyperlipemia   . Hypertension    h/o had gastric bypass and was able to come off meds  . Pulmonary embolism ([place])   . Sleep apnea    no cpap since her gastric bypass    Assessment/Plan:   1 Day Post-Op Procedure(s) (LRB): RIGHT TOTAL KNEE ARTHROPLASTY  (Right) Active Problems:   Status post total knee replacement using cement, right  Estimated body mass index is 22.05 kg/m as calculated from the following:   Height as of this encounter: 5\' 8"  (1.727 m).   Weight as of this encounter: 65.8 kg. Advance diet Up with therapy  Needs BM Acute post op blood loss anemia - start trinsicon, recheck labs in the am Encouraged po Norco q 4 hrs PRN CM to assist with discharge  DVT Prophylaxis - Foot Pumps, TED hose and Eliquis Weight-Bearing as tolerated to right leg   T. Rachelle Hora, PA-C [place] 12/04/2018, 8:22 AM

## 2018-12-04 NOTE — Progress Notes (Signed)
OT Cancellation Note  Patient Details Name: Ann Russell MRN: [id number] DOB: December 06, 1940   Cancelled Treatment:    Reason Eval/Treat Not Completed: Other (comment);Patient declined, no reason specified. Thank you for the OT consult. Order received and chart reviewed. This author attempted x2 to see pt this AM. On initial attempt pt working with PT, on second attempt pt stating she was having 10/10 pain in her groin and "I feel like I tore my groin muscle". Will follow acutely and re-attempt at a later time/date as available and pt medically appropriate for OT tx.   Shara Blazing, M.S., OTR/L Ascom: [phone] 12/04/18, 10:30 AM

## 2018-12-05 LAB — BASIC METABOLIC PANEL
Anion gap: 8 (ref 5–15)
BUN: 23 mg/dL (ref 8–23)
CO2: 22 mmol/L (ref 22–32)
Calcium: 8.6 mg/dL — ABNORMAL LOW (ref 8.9–10.3)
Chloride: 106 mmol/L (ref 98–111)
Creatinine, Ser: 1.11 mg/dL — ABNORMAL HIGH (ref 0.44–1.00)
GFR calc Af Amer: 55 mL/min — ABNORMAL LOW (ref >60)
GFR calc non Af Amer: 48 mL/min — ABNORMAL LOW (ref >60)
Glucose, Bld: 97 mg/dL (ref 70–99)
Potassium: 3.4 mmol/L — ABNORMAL LOW (ref 3.5–5.1)
Sodium: 136 mmol/L (ref 135–145)

## 2018-12-05 LAB — URINALYSIS, COMPLETE (UACMP) WITH MICROSCOPIC
Bacteria, UA: NONE SEEN
Bilirubin Urine: NEGATIVE
Glucose, UA: NEGATIVE mg/dL
Hgb urine dipstick: NEGATIVE
Ketones, ur: NEGATIVE mg/dL
Leukocytes,Ua: NEGATIVE
Nitrite: NEGATIVE
Protein, ur: NEGATIVE mg/dL
Specific Gravity, Urine: 1.011 (ref 1.005–1.030)
pH: 6 (ref 5.0–8.0)

## 2018-12-05 LAB — CBC
HCT: 31.9 % — ABNORMAL LOW (ref 36.0–46.0)
Hemoglobin: 10.3 g/dL — ABNORMAL LOW (ref 12.0–15.0)
MCH: 29.7 pg (ref 26.0–34.0)
MCHC: 32.3 g/dL (ref 30.0–36.0)
MCV: 91.9 fL (ref 80.0–100.0)
Platelets: 237 10*3/uL (ref 150–400)
RBC: 3.47 MIL/uL — ABNORMAL LOW (ref 3.87–5.11)
RDW: 16 % — ABNORMAL HIGH (ref 11.5–15.5)
WBC: 8.2 10*3/uL (ref 4.0–10.5)
nRBC: 0 % (ref 0.0–0.2)

## 2018-12-05 MED ORDER — POTASSIUM CHLORIDE CRYS ER 10 MEQ PO TBCR
10.0000 meq | EXTENDED_RELEASE_TABLET | Freq: Every day | ORAL | Status: DC
  Administered 2018-12-05 – 2018-12-06 (×2): 10 meq via ORAL
  Filled 2018-12-05 (×2): qty 1

## 2018-12-05 NOTE — Progress Notes (Signed)
Pt is noted to be extremely confused, alert and oriented to self, trying to get out of bed, ripping of lines and dressing. Staff reoriented pt but started to cuss and make threats to "call police". Per pt, she was "kidnapped" and against her will being in the hospital. Pt was assisted to the bedpan, gown changed made comfortable. Bed alarm on and side rails up for safety.

## 2018-12-05 NOTE — Progress Notes (Signed)
Physical Therapy Treatment Patient Details Name: Ann Russell MRN: [id number] DOB: 13-Nov-1940 Today's Date: 12/05/2018    History of Present Illness Pt admitted for R TKR. History includes HTN, sleep apnea, and depression. 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    PT Comments    Pt in recliner, ready to get back to bed.  Pt less confused this interaction but remains confused.  Attempted to stand +1 assist from recliner.  While she is able to get one hand up to walker she is unable to bring her second hand up to handle despite mod a +1 and verbal cues.  Eventually she needed to sit back down and +2 assist called.  Stood and transferred to bed with mod a x 2 and pt trying to sit before fully turning and requiring max cues to remains standing to complete transfer.  Once seated KI removed for stretching/ROM in sitting.  She returned to supine with min a x 1 for repositioning.  Refused bone foam at this time.  Long discussion regarding assistance level needed at discharge.  She remains unable to transfer without +2 assist and even with, skilled intervention is often required for safety.  She is more aware and admits that she will struggle at home and that Herbie Baltimore will have trouble managing her.  "I fall so often at home as it is."  After discussion she is open to SNF bed search.  Discussed at length precautions being taken at SNF facilities to avoid Covid spread.  At this time, without +2 assist at all times, she is a very high risk for falling if Herbie [place] can even manage to get her standing on his own at home which is unlikely.  She agreed.  Contacted care management regarding her willingness to look at SNF options for a safe discharge.     Follow Up Recommendations  SNF     Equipment Recommendations  Rolling walker with 5" wheels    Recommendations for Other Services       Precautions / Restrictions Precautions Precautions: Fall;Knee Precaution  Booklet Issued: Yes (comment) Required Braces or Orthoses: Knee Immobilizer - Right Restrictions Weight Bearing Restrictions: Yes RLE Weight Bearing: Weight bearing as tolerated    Mobility  Bed Mobility Overal bed mobility: Needs Assistance Bed Mobility: Sit to Supine Rolling: Min assist   Supine to sit: Min assist Sit to supine: Min assist      Transfers Overall transfer level: Needs assistance Equipment used: Rolling walker (2 wheeled) Transfers: Sit to/from Stand Sit to Stand: Min assist;+2 physical assistance            Ambulation/Gait Ambulation/Gait assistance: Mod assist;+2 physical assistance Gait Distance (Feet): 2 Feet Assistive device: Rolling walker (2 wheeled) Gait Pattern/deviations: Step-to pattern     General Gait Details: unable   Stairs             Wheelchair Mobility    Modified Rankin (Stroke Patients Only)       Balance Overall balance assessment: Needs assistance Sitting-balance support: Bilateral upper extremity supported Sitting balance-Leahy Scale: Good     Standing balance support: Bilateral upper extremity supported Standing balance-Leahy Scale: Poor Standing balance comment: requires BUE support on RW                            Cognition Arousal/Alertness: Awake/alert Behavior During Therapy: WFL for tasks assessed/performed Overall Cognitive Status: Impaired/Different from  baseline Area of Impairment: Orientation;Safety/judgement;Problem solving;Awareness                 Orientation Level: Place(intermittently reports being at home but later states she is at the hospital)       Safety/Judgement: Decreased awareness of safety Awareness: Intellectual;Emergent Problem Solving: Slow processing;Requires verbal cues General Comments: Less confused each interaction but remains not at baseline with cognition.      Exercises Total Joint Exercises Long Arc Quad: AAROM;10 reps;Right;Seated Knee  Flexion: AAROM;Right;Seated;5 reps Goniometric ROM: 0-75 in sitting - limited by pain Other Exercises Other Exercises: to commode to void Other Exercises: Pt instructed in polar care mgt, compression stocking mgt, AE/DME for ADL, functional transfer training, and falls prevention strategies; pt verbalizes understanding but unable to perform teach back; handout provided to support recall and carryover    General Comments        Pertinent Vitals/Pain Pain Assessment: Faces Faces Pain Scale: Hurts little more Pain Location: R knee Pain Descriptors / Indicators: Operative site guarding;Aching;Sore Pain Intervention(s): Limited activity within patient's tolerance;Monitored during session;Repositioned;Ice applied    Home Living Family/patient expects to be discharged to:: Private residence Living Arrangements: Non-relatives/Friends Available Help at Discharge: Family;Available 24 hours/day Type of Home: House Home Access: Ramped entrance   Home Layout: One level Home Equipment: Walker - 4 wheels;Bedside commode;Shower seat;Transport chair(bed rail)      Prior Function Level of Independence: Independent with assistive device(s)      Comments: was able to complete ADLs with rollater. Limited household distances performed with multiple falls noted   PT Goals (current goals can now be found in the care plan section) Acute Rehab PT Goals Patient Stated Goal: to go home Progress towards PT goals: Progressing toward goals    Frequency    BID      PT Plan Current plan remains appropriate    Co-evaluation PT/OT/SLP Co-Evaluation/Treatment: Yes Reason for Co-Treatment: For patient/therapist safety;To address functional/ADL transfers;Necessary to address cognition/behavior during functional activity PT goals addressed during session: Mobility/safety with mobility;Balance OT goals addressed during session: ADL's and self-care;Proper use of Adaptive equipment and DME      AM-PAC  PT "6 Clicks" Mobility   Outcome Measure  Help needed turning from your back to your side while in a flat bed without using bedrails?: A Little Help needed moving from lying on your back to sitting on the side of a flat bed without using bedrails?: A Little Help needed moving to and from a bed to a chair (including a wheelchair)?: A Lot Help needed standing up from a chair using your arms (e.g., wheelchair or bedside chair)?: A Lot Help needed to walk in hospital room?: A Lot Help needed climbing 3-5 steps with a railing? : Total 6 Click Score: 13    End of Session Equipment Utilized During Treatment: Right knee immobilizer;Gait belt Activity Tolerance: Patient tolerated treatment well Patient left: in bed;with call bell/phone within reach;with bed alarm set   Pain - Right/Left: Right Pain - part of body: Knee     Time: 1430-1502 PT Time Calculation (min) (ACUTE ONLY): 32 min  Charges:  $Gait Training: 8-22 mins $Therapeutic Exercise: 8-22 mins $Therapeutic Activity: 8-22 mins                     Chesley Noon, PTA 12/05/18, 3:21 PM

## 2018-12-05 NOTE — Progress Notes (Signed)
In to patient's room as she requested something for headache. Presented patient with 2 tylenol tabs. Pt requesting to see the bottle they came out of. Explained no bottle but machine. Pt states how does she know its Tylenol. Attempted to reassure patient that Tylenol is what MD has ordered.  Patient takes both tabs a starts writing numbers on a credit card. Patient states "this is further proof that yal say I am crazy".  Patient holding med cup with tylenol in it. Questioned patient if she was going to take the meds. Pt states"I will take them when my MD comes in" Explained to pt meds could not be left at bedside. "Pt states well now you don't have a choice , do you?"   Nursing took med cup from patient and stated that when she was ready to take her med to ring for nurse.

## 2018-12-05 NOTE — Evaluation (Signed)
Occupational Therapy Evaluation Patient Details Name: Ann Russell MRN: [id number] DOB: 1940-12-17 Today's Date: 12/05/2018    History of Present Illness Pt admitted for R TKR. History includes HTN, sleep apnea, and depression. 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   Clinical Impression   Pt seen for OT evaluation and co-tx with PTA this date, POD#1 from above surgery. Per pt, she was independent in all ADL prior to surgery, however pt mildly confused at time of assessment and unreliable as historian. Pt is eager to return to PLOF with less pain and improved safety and independence. Pt currently requires Min A +2 assist for functional amb w/ RW, Mod A +2 for sit to stand transfers from EOB and BSC, and Max A for LB dressing and bathing while in seated position due to pain and limited AROM of R knee. Pt instructed in polar care mgt, falls prevention strategies, home/routines modifications, DME/AE for LB bathing and dressing tasks, and compression stocking mgt. Handout provided. Pt would benefit from skilled OT services including additional instruction in dressing techniques with or without assistive devices for dressing and bathing skills to support recall and carryover prior to discharge and ultimately to maximize safety, independence, and minimize falls risk and caregiver burden. Currently recommending STR upon discharge given poor safety awareness and impaired cognition as well as level of physical assist pt currently requires.      Follow Up Recommendations  SNF    Equipment Recommendations  Other (comment)(reacher)    Recommendations for Other Services       Precautions / Restrictions Precautions Precautions: Fall;Knee Precaution Booklet Issued: Yes (comment) Required Braces or Orthoses: Knee Immobilizer - Right Restrictions Weight Bearing Restrictions: Yes RLE Weight Bearing: Weight bearing as tolerated      Mobility Bed  Mobility Overal bed mobility: Needs Assistance Bed Mobility: Supine to Sit Rolling: Min assist   Supine to sit: Min assist        Transfers Overall transfer level: Needs assistance Equipment used: Rolling walker (2 wheeled) Transfers: Sit to/from Stand Sit to Stand: Min assist;Mod assist;+2 physical assistance         General transfer comment: deferred    Balance Overall balance assessment: Needs assistance Sitting-balance support: Bilateral upper extremity supported Sitting balance-Leahy Scale: Good     Standing balance support: Bilateral upper extremity supported Standing balance-Leahy Scale: Poor Standing balance comment: requires BUE support on RW                           ADL either performed or assessed with clinical judgement   ADL                                         General ADL Comments: Min+2 for ambulating w/ RW approx 5 side steps; Mod +2 for functional toilet transfer from [facility], CGA to perform hygiene, Mod-Max A for LB ADL tasks     Vision Baseline Vision/History: Wears glasses Wears Glasses: At all times Patient Visual Report: No change from baseline Vision Assessment?: No apparent visual deficits     Perception     Praxis      Pertinent Vitals/Pain Pain Assessment: Faces Faces Pain Scale: Hurts little more Pain Location: R knee Pain Descriptors / Indicators: Operative site guarding;Aching;Sore Pain Intervention(s): Limited activity within patient's tolerance;Monitored during  session;Repositioned;Ice applied     Hand Dominance     Extremity/Trunk Assessment Upper Extremity Assessment Upper Extremity Assessment: Overall WFL for tasks assessed   Lower Extremity Assessment Lower Extremity Assessment: Generalized weakness;Defer to PT evaluation   Cervical / Trunk Assessment Cervical / Trunk Assessment: Normal   Communication Communication Communication: No difficulties   Cognition Arousal/Alertness:  Awake/alert Behavior During Therapy: WFL for tasks assessed/performed Overall Cognitive Status: Impaired/Different from baseline Area of Impairment: Orientation;Safety/judgement;Problem solving;Awareness                 Orientation Level: Place(intermittently reports being at home but later states she is at the hospital)       Safety/Judgement: Decreased awareness of safety Awareness: Intellectual;Emergent Problem Solving: Slow processing;Requires verbal cues General Comments: Less confused today but remains not at baseline with cognition.   General Comments       Exercises Other Exercises Other Exercises: Pt instructed in polar care mgt, compression stocking mgt, AE/DME for ADL, functional transfer training, and falls prevention strategies; pt verbalizes understanding but unable to perform teach back; handout provided to support recall and carryover   Shoulder Instructions      Home Living Family/patient expects to be discharged to:: Private residence Living Arrangements: Non-relatives/Friends Available Help at Discharge: Family;Available 24 hours/day Type of Home: House Home Access: Ramped entrance     Home Layout: One level               Home Equipment: Walker - 4 wheels;Bedside commode;Shower seat;Transport chair(bed rail)          Prior Functioning/Environment Level of Independence: Independent with assistive device(s)        Comments: was able to complete ADLs with rollater. Limited household distances performed with multiple falls noted        OT Problem List: Decreased strength;Decreased range of motion;Pain;Decreased cognition;Decreased activity tolerance;Decreased safety awareness;Decreased knowledge of use of DME or AE;Decreased knowledge of precautions;Impaired balance (sitting and/or standing)      OT Treatment/Interventions: Self-care/ADL training;Therapeutic exercise;Therapeutic activities;DME and/or AE instruction;Patient/family  education;Balance training    OT Goals(Current goals can be found in the care plan section) Acute Rehab OT Goals Patient Stated Goal: to go home OT Goal Formulation: With patient Time For Goal Achievement: 12/19/18 Potential to Achieve Goals: Good ADL Goals Pt Will Perform Lower Body Dressing: with min assist;sit to/from stand;with caregiver independent in assisting Pt Will Transfer to Toilet: ambulating;bedside commode;with min assist(LRAD for amb) Additional ADL Goal #1: Pt will independently instruct family/caregiver in compression stocking mgt Additional ADL Goal #2: Pt will independently instruct family/caregiver in polar care mgt  OT Frequency: Min 1X/week   Barriers to D/C: Decreased caregiver support          Co-evaluation PT/OT/SLP Co-Evaluation/Treatment: Yes Reason for Co-Treatment: For patient/therapist safety;To address functional/ADL transfers;Necessary to address cognition/behavior during functional activity PT goals addressed during session: Mobility/safety with mobility;Balance OT goals addressed during session: ADL's and self-care;Proper use of Adaptive equipment and DME      AM-PAC OT "6 Clicks" Daily Activity     Outcome Measure Help from another person eating meals?: None Help from another person taking care of personal grooming?: None Help from another person toileting, which includes using toliet, bedpan, or urinal?: A Lot Help from another person bathing (including washing, rinsing, drying)?: A Lot Help from another person to put on and taking off regular upper body clothing?: A Little Help from another person to put on and taking off regular lower body clothing?: A Lot  6 Click Score: 17   End of Session Equipment Utilized During Treatment: Gait belt;Rolling walker  Activity Tolerance: Patient tolerated treatment well Patient left: in chair;with call bell/phone within reach;with chair alarm set;with SCD's reapplied;Other (comment)(polar care in  place)  OT Visit Diagnosis: Other abnormalities of gait and mobility (R26.89);Pain Pain - Right/Left: Right Pain - part of body: Knee                Time: 1136-1204 OT Time Calculation (min): 28 min Charges:  OT General Charges $OT Visit: 1 Visit OT Evaluation $OT Eval Low Complexity: 1 Low OT Treatments $Self Care/Home Management : 8-22 mins  Jeni Salles, MPH, MS, OTR/L ascom [phone] 12/05/18, 1:36 PM

## 2018-12-05 NOTE — TOC Progression Note (Signed)
Transition of Care [facility]) - Progression Note    Patient Details  Name: Ann Russell MRN: [id number] Date of Birth: Feb 07, 1941  [facility] CM/SW Contact  Raizel Wesolowski, Lenice Llamas Phone Number: [phone]  12/05/2018, 4:13 PM  Clinical Narrative: Per RN and PT patient has changed her mind and is now agreeable to SNF. FL2 complete and faxed out. TOC team member will follow up with patient tomorrow and present bed offers.     Expected Discharge Plan: [place] Barriers to Discharge: Continued Medical Work up  Expected Discharge Plan and Services Expected Discharge Plan: [place]   Discharge Planning Services: CM Consult Post Acute Care Choice: [place] arrangements for the past 2 months: Single Family Home Expected Discharge Date: 12/05/18               DME Arranged: Gilford Rile rolling DME Agency: [employer] Date DME Agency Contacted: 12/04/18 Time DME Agency Contacted: [phone] Representative spoke with at DME Agency: [place]: PT [place] Date [place]: 12/04/18 Time [place]: [id number] Representative spoke with at [place]: Cassie   Social Determinants of Health ([place]) Interventions    Readmission Risk Interventions No flowsheet data found.

## 2018-12-05 NOTE — Progress Notes (Signed)
Patient demonstrating paranoid behavior. Speaking hateful and scarcastic to staff.  On phone to family telling family that she is being held against her will.   Explained that MD would be in to see her at some point today.

## 2018-12-05 NOTE — Progress Notes (Signed)
   Subjective: 2 Days Post-Op Procedure(s) (LRB): RIGHT TOTAL KNEE ARTHROPLASTY (Right) Patient with altered mental status this morning.  States she has been here for 1 month and feels like she is been drugged. Denies any CP, SOB, ABD pain. We will continue therapy today.  Plan is to go Home after hospital stay.  Objective: Vital signs in last 24 hours: Temp:  [98.2 F (36.8 C)-98.7 F (37.1 C)] 98.2 F (36.8 C) (08/13 0803) Pulse Rate:  [67-75] 75 (08/13 0803) Resp:  [18-19] 18 (08/13 0803) BP: (140-153)/(62-79) 153/79 (08/13 0803) SpO2:  [99 %-100 %] 100 % (08/13 0803)  Intake/Output from previous day: 08/12 0701 - 08/13 0700 In: 2493.7 [I.V.:2493.7] Out: -  Intake/Output this shift: No intake/output data recorded.  Recent Labs    12/04/18 0512 12/05/18 0406  HGB 9.8* 10.3*   Recent Labs    12/04/18 0512 12/05/18 0406  WBC 6.1 8.2  RBC 3.32* 3.47*  HCT 30.7* 31.9*  PLT 219 237   Recent Labs    12/04/18 0512 12/05/18 0406  NA 132* 136  K 4.3 3.4*  CL 103 106  CO2 24 22  BUN 37* 23  CREATININE 1.43* 1.11*  GLUCOSE 109* 97  CALCIUM 8.4* 8.6*   No results for input(s): LABPT, INR in the last 72 hours.  EXAM General - Patient is Alert, Appropriate and Oriented Extremity - Neurovascular intact Sensation intact distally Intact pulses distally Dorsiflexion/Plantar flexion intact No cellulitis present Compartment soft Dressing - dressing C/D/I and scant drainage Motor Function - intact, moving foot and toes well on exam.   Past Medical History:  Diagnosis Date  . Arthritis    hands  . 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 Depression   . Hyperlipemia   . Hypertension    h/o had gastric bypass and was able to come off meds  . Pulmonary embolism ([place])   . Sleep apnea    no cpap since her gastric bypass    Assessment/Plan:   2 Days Post-Op Procedure(s)  (LRB): RIGHT TOTAL KNEE ARTHROPLASTY (Right) Active Problems:   Status post total knee replacement using cement, right Altered mental status Estimated body mass index is 22.05 kg/m as calculated from the following:   Height as of this encounter: 5\' 8"  (1.727 m).   Weight as of this encounter: 65.8 kg. Advance diet Up with therapy  Needs BM Acute post op blood loss anemia -continue oral iron supplement.  Hemoglobin trending up Altered mental status-hold narcotics, Tylenol as needed for pain.  Check UA CM to assist with discharge  DVT Prophylaxis - Foot Pumps, TED hose and Eliquis Weight-Bearing as tolerated to right leg   T. Rachelle Hora, PA-C [place] 12/05/2018, 8:25 AM

## 2018-12-05 NOTE — Progress Notes (Signed)
Physical Therapy Treatment Patient Details Name: Ann Russell MRN: [id number] DOB: 15-Nov-1940 Today's Date: 12/05/2018    History of Present Illness Pt admitted for R TKR. History includes HTN, sleep apnea, and depression. 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    PT Comments    Returned for co-tx session with OT to address mobility.  Only 1 unit billed for PT session. To edge of bed with min a x 1.  Once sitting, she is able to sit unsupported with no dizziness or nausea noted.  Stood with min a x 2 on first attempt but quickly fell back onto the bed.  On second attempt she is able to turn to commode with min a x 2 and heavy verbal cues for sequencing and to push "down" on walker to prevent post LOB/fall.  After voiding, she stood and transferred 5' to recliner at bedside.  KI was donned for all tasks.  Pt called husband while in room.  Talked to him with her permission.  He was made aware of her mobility challenges and need for +2 assist and limited transfers and gait.  He voiced understanding.  Concerns expressed regarding safety with +1 assist at home and high risk for falls.  Pt and husband both voiced concerns over Covid and the risks of contracting it at a SNF and are firm in their decision to return home.  Education provided but they are unwavering.  They have a transport chair at home along with bedside commode and a ramp.  They have a rollator walker but will need a rolling walker.  HHPT is requested by pt and husband.  While SNF remains the recommendation due to to safety and +2 assist, they are choosing to to return home at this time.  Encouraged him to look into family or care for +2 assist initially upon returning home.     Follow Up Recommendations  SNF     Equipment Recommendations  Rolling walker with 5" wheels    Recommendations for Other Services       Precautions / Restrictions Precautions Precautions: Fall;Knee Required  Braces or Orthoses: Knee Immobilizer - Right Restrictions Weight Bearing Restrictions: Yes RLE Weight Bearing: Weight bearing as tolerated    Mobility  Bed Mobility Overal bed mobility: Needs Assistance Bed Mobility: Supine to Sit Rolling: Min assist   Supine to sit: Min assist        Transfers Overall transfer level: Needs assistance Equipment used: Rolling walker (2 wheeled) Transfers: Sit to/from Stand Sit to Stand: Min assist;Mod assist;+2 physical assistance         General transfer comment: deferred  Ambulation/Gait Ambulation/Gait assistance: Min assist;+2 physical assistance Gait Distance (Feet): 4 Feet Assistive device: Rolling walker (2 wheeled) Gait Pattern/deviations: Step-to pattern     General Gait Details: unable   Stairs             Wheelchair Mobility    Modified Rankin (Stroke Patients Only)       Balance Overall balance assessment: Needs assistance Sitting-balance support: Bilateral upper extremity supported Sitting balance-Leahy Scale: Good     Standing balance support: Bilateral upper extremity supported Standing balance-Leahy Scale: Poor                              Cognition Arousal/Alertness: Awake/alert Behavior During Therapy: WFL for tasks assessed/performed Overall Cognitive Status: Impaired/Different from baseline  General Comments: Less confused today but remains not at baseline with cognition.      Exercises Total Joint Exercises Ankle Circles/Pumps: AAROM;10 reps;Both;Supine Heel Slides: AAROM;Supine;Right;10 reps Hip ABduction/ADduction: AAROM;Supine;Right;10 reps Straight Leg Raises: AAROM;Supine;Right;10 reps Goniometric ROM: 0-20 in supine.  uanble to tolerate sitting long enough for seated AAROM Other Exercises Other Exercises: to commode to void    General Comments        Pertinent Vitals/Pain Pain Assessment: Faces Faces Pain Scale:  Hurts little more Pain Location: R knee Pain Descriptors / Indicators: Operative site guarding;Aching;Sore Pain Intervention(s): Limited activity within patient's tolerance;Monitored during session;Premedicated before session    Home Living                      Prior Function            PT Goals (current goals can now be found in the care plan section) Progress towards PT goals: Progressing toward goals    Frequency    BID      PT Plan Current plan remains appropriate    Co-evaluation PT/OT/SLP Co-Evaluation/Treatment: Yes Reason for Co-Treatment: For patient/therapist safety;To address functional/ADL transfers PT goals addressed during session: Mobility/safety with mobility;Balance OT goals addressed during session: ADL's and self-care;Proper use of Adaptive equipment and DME      AM-PAC PT "6 Clicks" Mobility   Outcome Measure  Help needed turning from your back to your side while in a flat bed without using bedrails?: A Little Help needed moving from lying on your back to sitting on the side of a flat bed without using bedrails?: A Little Help needed moving to and from a bed to a chair (including a wheelchair)?: A Lot Help needed standing up from a chair using your arms (e.g., wheelchair or bedside chair)?: A Lot Help needed to walk in hospital room?: A Lot Help needed climbing 3-5 steps with a railing? : Total 6 Click Score: 13    End of Session Equipment Utilized During Treatment: Right knee immobilizer;Gait belt Activity Tolerance: Patient tolerated treatment well Patient left: in chair;with call bell/phone within reach;with chair alarm set Nurse Communication: Other (comment);Patient requests pain meds Pain - Right/Left: Right Pain - part of body: Knee     Time: 1137-1203 PT Time Calculation (min) (ACUTE ONLY): 26 min  Charges:  $Gait Training: 8-22 mins $Therapeutic Exercise: 8-22 mins $Therapeutic Activity: 8-22 mins                     Chesley Noon, PTA 12/05/18, 12:55 PM

## 2018-12-05 NOTE — Progress Notes (Signed)
Physical Therapy Treatment Patient Details Name: Ann Russell MRN: [id number] DOB: 1941/03/16 Today's Date: 12/05/2018    History of Present Illness Pt admitted for R TKR. History includes HTN, sleep apnea, and depression. 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    PT Comments    Pt in bed,  Inc of urine.  Care given.  Participated in exercises as described below. Pt with soreness R knee limiting AAROM but she is able to progress with slow movements and time.  RN in and pain medications given per her request.  To edge of bed with min/mod a x 1.  Once sitting, she is able to sit with min guard.  Pt reports headache, pain in R knee and nausea as limiting factors.  Time was given in static sitting to lessen symptoms but she stated she was unable to sit any longer stating she felt she was going to throw up her pain medications.  Returned to supine with mod a x 1.  KI was donned for tasks and left on.  Will plan to return prior to lunch to attempt OOB for meal once pain medication takes effect.   Follow Up Recommendations  SNF     Equipment Recommendations  Rolling walker with 5" wheels    Recommendations for Other Services       Precautions / Restrictions Precautions Precautions: Fall;Knee Restrictions Weight Bearing Restrictions: Yes RLE Weight Bearing: Weight bearing as tolerated    Mobility  Bed Mobility Overal bed mobility: Needs Assistance Bed Mobility: Rolling Rolling: Min assist   Supine to sit: Mod assist        Transfers                 General transfer comment: deferred  Ambulation/Gait             General Gait Details: unable   Stairs             Wheelchair Mobility    Modified Rankin (Stroke Patients Only)       Balance Overall balance assessment: Needs assistance;History of Falls Sitting-balance support: Bilateral upper extremity supported Sitting balance-Leahy Scale: Good                                       Cognition Arousal/Alertness: Awake/alert Behavior During Therapy: Anxious Overall Cognitive Status: Impaired/Different from baseline                                 General Comments: Less confused today but remains not at baseline with cognition.      Exercises Total Joint Exercises Ankle Circles/Pumps: AAROM;10 reps;Both;Supine Heel Slides: AAROM;Supine;Right;10 reps Hip ABduction/ADduction: AAROM;Supine;Right;10 reps Straight Leg Raises: AAROM;Supine;Right;10 reps Goniometric ROM: 0-20 in supine.  uanble to tolerate sitting long enough for seated AAROM    General Comments        Pertinent Vitals/Pain Pain Assessment: Faces Faces Pain Scale: Hurts whole lot Pain Location: R knee and headache Pain Descriptors / Indicators: Operative site guarding;Aching;Sore Pain Intervention(s): Limited activity within patient's tolerance;Monitored during session;RN gave pain meds during session    Home Living                      Prior Function  PT Goals (current goals can now be found in the care plan section) Progress towards PT goals: Progressing toward goals    Frequency    BID      PT Plan Current plan remains appropriate    Co-evaluation              AM-PAC PT "6 Clicks" Mobility   Outcome Measure  Help needed turning from your back to your side while in a flat bed without using bedrails?: A Little Help needed moving from lying on your back to sitting on the side of a flat bed without using bedrails?: A Lot Help needed moving to and from a bed to a chair (including a wheelchair)?: A Lot Help needed standing up from a chair using your arms (e.g., wheelchair or bedside chair)?: A Lot Help needed to walk in hospital room?: Total Help needed climbing 3-5 steps with a railing? : Total 6 Click Score: 11    End of Session Equipment Utilized During Treatment: Right knee  immobilizer Activity Tolerance: Patient limited by pain;Other (comment) Patient left: in bed;with bed alarm set;Other (comment) Nurse Communication: Other (comment);Patient requests pain meds Pain - Right/Left: Right Pain - part of body: Knee     Time: 9012-2241 PT Time Calculation (min) (ACUTE ONLY): 32 min  Charges:  $Therapeutic Exercise: 8-22 mins $Therapeutic Activity: 8-22 mins                    Chesley Noon, PTA 12/05/18, 10:49 AM

## 2018-12-05 NOTE — NC FL2 (Signed)
[place] LEVEL OF CARE SCREENING TOOL     IDENTIFICATION  Patient Name: Ann Russell Birthdate: Jun 19, 1940 Sex: female Admission Date (Current Location): 12/03/2018  [place] and [place] Number:  Engineering geologist and Address:  [facility], [address], [place], [place] [address]      Provider Number: [id number]  Attending Physician Name and Address:  Hessie Knows, MD  Relative Name and Phone Number:       Current Level of Care: Hospital Recommended Level of Care: [place] Prior Approval Number:    Date Approved/Denied:   PASRR Number: [id number]  Discharge Plan: SNF    Current Diagnoses: Patient Active Problem List   Diagnosis Date Noted  . 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 Shoulder pain, acute 07/27/2017  . Hypertension 07/24/2017  . Pulmonary embolism ([place]) 06/21/2017  . 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    Orientation RESPIRATION BLADDER Height & Weight     Self, Time, Situation, Place  Normal Continent Weight: 145 lb (65.8 kg) Height:  5\' 8"  (172.7 cm)  BEHAVIORAL SYMPTOMS/MOOD NEUROLOGICAL BOWEL NUTRITION STATUS      Continent Diet(Diet: Regular)  AMBULATORY STATUS COMMUNICATION OF NEEDS Skin   Extensive Assist Verbally Surgical wounds(Incision: Right Knee.)                       Personal Care Assistance Level of Assistance  Bathing, Feeding, Dressing Bathing Assistance: Limited assistance Feeding assistance: Independent Dressing Assistance: Limited assistance     Functional Limitations Info  Sight, Hearing, Speech Sight Info: Adequate Hearing Info: Adequate Speech Info: Adequate    SPECIAL CARE FACTORS FREQUENCY  PT (By licensed PT), OT (By licensed OT)     PT Frequency: 5 OT Frequency: 5            Contractures      Additional Factors Info  Code Status, Allergies Code Status Info: Full Code. Allergies Info: No Known  Allergies.           Current Medications (12/05/2018):  This is the current hospital active medication list Current Facility-Administered Medications  Medication Dose Route Frequency Provider Last Rate Last Dose  . 0.9 %  sodium chloride infusion   Intravenous Continuous Hessie Knows, MD 75 mL/hr at 12/04/18 1402    . acetaminophen (TYLENOL) tablet 325-650 mg  325-650 mg Oral Q6H PRN Hessie Knows, MD   325 mg at 12/05/18 1009  . alum & mag hydroxide-simeth (MAALOX/MYLANTA) 200-200-20 MG/5ML suspension 30 mL  30 mL Oral Q4H PRN Hessie Knows, MD      . apixaban Arne [place]) tablet 5 mg  5 mg Oral BID Hessie Knows, MD   5 mg at 12/05/18 0930  . bisacodyl (DULCOLAX) EC tablet 5 mg  5 mg Oral Daily PRN Hessie Knows, MD      . busPIRone (BUSPAR) tablet 15 mg  15 mg Oral BID Hessie Knows, MD   15 mg at 12/05/18 0931  . calcitRIOL (ROCALTROL) capsule 0.25 mcg  0.25 mcg Oral Daily Hessie Knows, MD   0.25 mcg at 12/05/18 1000  . calcium-vitamin D (OSCAL WITH D) 500-200 MG-UNIT per tablet 1 tablet  1 tablet Oral q morning - 10a Hessie Knows, MD   1 tablet at 12/05/18 0930  . diphenhydrAMINE (BENADRYL) 12.5 MG/5ML elixir 12.5-25 mg  12.5-25 mg Oral Q4H PRN Hessie Knows, MD      . docusate sodium (COLACE) capsule 100 mg  100  mg Oral BID Hessie Knows, MD   100 mg at 12/05/18 0931  . ferrous [id number] C-folic acid (TRINSICON / FOLTRIN) capsule 1 capsule  1 capsule Oral BID Duanne Guess, PA-C   1 capsule at 12/05/18 1000  . FLUoxetine (PROZAC) capsule 60 mg  60 mg Oral Claudine Mouton, Legrand Como, MD   60 mg at 12/05/18 0800  . HYDROcodone-acetaminophen (NORCO) 7.5-325 MG per tablet 1-2 tablet  1-2 tablet Oral Q4H PRN Hessie Knows, MD   1 tablet at 12/04/18 [phone]  . HYDROcodone-acetaminophen (NORCO/VICODIN) 5-325 MG per tablet 1-2 tablet  1-2 tablet Oral Q4H PRN Hessie Knows, MD      . magnesium citrate solution 1 Bottle  1 Bottle Oral Once PRN Hessie Knows, MD      . magnesium hydroxide  (MILK OF MAGNESIA) suspension 30 mL  30 mL Oral Daily PRN Hessie Knows, MD      . menthol-cetylpyridinium (CEPACOL) lozenge 3 mg  1 lozenge Oral PRN Hessie Knows, MD       Or  . phenol (CHLORASEPTIC) mouth spray 1 spray  1 spray Mouth/Throat PRN Hessie Knows, MD      . methocarbamol (ROBAXIN) tablet 500 mg  500 mg Oral Q6H PRN Hessie Knows, MD   500 mg at 12/04/18 0532   Or  . methocarbamol (ROBAXIN) 500 mg in dextrose 5 % 50 mL IVPB  500 mg Intravenous Q6H PRN Hessie Knows, MD      . metoCLOPramide (REGLAN) tablet 5-10 mg  5-10 mg Oral Q8H PRN Hessie Knows, MD       Or  . metoCLOPramide (REGLAN) injection 5-10 mg  5-10 mg Intravenous Q8H PRN Hessie Knows, MD      . morphine 2 MG/ML injection 0.5-1 mg  0.5-1 mg Intravenous Q2H PRN Hessie Knows, MD   1 mg at 12/03/18 1248  . multivitamin with minerals tablet 2 tablet  2 tablet Oral Daily Hessie Knows, MD   2 tablet at 12/05/18 0930  . ondansetron (ZOFRAN) tablet 4 mg  4 mg Oral Q6H PRN Hessie Knows, MD       Or  . ondansetron [facility]) injection 4 mg  4 mg Intravenous Q6H PRN Hessie Knows, MD      . potassium chloride (K-DUR) CR tablet 10 mEq  10 mEq Oral Daily Hessie Knows, MD      . pregabalin (LYRICA) capsule 100 mg  100 mg Oral TID Hessie Knows, MD   100 mg at 12/05/18 0931  . pyridOXINE (VITAMIN B-6) tablet   Oral Daily Hessie Knows, MD   50 mg at 12/05/18 1000  . valACYclovir (VALTREX) tablet 1,000 mg  1,000 mg Oral Daily PRN Hessie Knows, MD   1,000 mg at 12/03/18 2145  . zolpidem (AMBIEN) tablet 5 mg  5 mg Oral QHS PRN Hessie Knows, MD         Discharge Medications: Please see discharge summary for a list of discharge medications.  Relevant Imaging Results:  Relevant Lab Results:   Additional Information SSN: [id number]  Vanette Noguchi, Veronia Beets, LCSW

## 2018-12-06 LAB — CBC
HCT: 26.8 % — ABNORMAL LOW (ref 36.0–46.0)
Hemoglobin: 8.8 g/dL — ABNORMAL LOW (ref 12.0–15.0)
MCH: 29.7 pg (ref 26.0–34.0)
MCHC: 32.8 g/dL (ref 30.0–36.0)
MCV: 90.5 fL (ref 80.0–100.0)
Platelets: 207 10*3/uL (ref 150–400)
RBC: 2.96 MIL/uL — ABNORMAL LOW (ref 3.87–5.11)
RDW: 16.1 % — ABNORMAL HIGH (ref 11.5–15.5)
WBC: 7.4 10*3/uL (ref 4.0–10.5)
nRBC: 0 % (ref 0.0–0.2)

## 2018-12-06 LAB — BASIC METABOLIC PANEL
Anion gap: 6 (ref 5–15)
BUN: 29 mg/dL — ABNORMAL HIGH (ref 8–23)
CO2: 22 mmol/L (ref 22–32)
Calcium: 8.5 mg/dL — ABNORMAL LOW (ref 8.9–10.3)
Chloride: 108 mmol/L (ref 98–111)
Creatinine, Ser: 1.03 mg/dL — ABNORMAL HIGH (ref 0.44–1.00)
GFR calc Af Amer: >60 mL/min (ref >60)
GFR calc non Af Amer: 52 mL/min — ABNORMAL LOW (ref >60)
Glucose, Bld: 99 mg/dL (ref 70–99)
Potassium: 4.1 mmol/L (ref 3.5–5.1)
Sodium: 136 mmol/L (ref 135–145)

## 2018-12-06 LAB — SARS CORONAVIRUS 2 BY RT PCR (HOSPITAL ORDER, PERFORMED IN ~~LOC~~ HOSPITAL LAB): SARS Coronavirus 2: NEGATIVE

## 2018-12-06 MED ORDER — BISACODYL 5 MG PO TBEC: 5.0000 mg | DELAYED_RELEASE_TABLET | Freq: Every day | ORAL | 0 refills | Status: DC | PRN

## 2018-12-06 MED ORDER — HYDROCODONE-ACETAMINOPHEN 5-325 MG PO TABS: 1.0000 | ORAL_TABLET | Freq: Four times a day (QID) | ORAL | 0 refills | Status: DC | PRN

## 2018-12-06 MED ORDER — FE FUMARATE-B12-VIT C-FA-IFC PO CAPS: 1.0000 | ORAL_CAPSULE | Freq: Two times a day (BID) | ORAL | Status: AC

## 2018-12-06 NOTE — TOC Progression Note (Signed)
Transition of Care [facility]) - Progression Note    Patient Details  Name: Ann Russell MRN: [id number] Date of Birth: [birth year]  Transition of Care [facility]) CM/SW [place], RN Phone Number: 12/06/2018, 9:40 AM  Clinical Narrative:    Reviewed the bed offers with the patient and she chose [facility], I notified Kelly at [facility] and they are starting insurance auth, she will call me back once received   Expected Discharge Plan: [place] Barriers to Discharge: Continued Medical Work up  Expected Discharge Plan and Services Expected Discharge Plan: [place]   Discharge Planning Services: CM Consult Post Acute Care Choice: [place] arrangements for the past 2 months: Single Family Home Expected Discharge Date: 12/06/18               DME Arranged: Gilford Rile rolling DME Agency: [employer] Date DME Agency Contacted: 12/04/18 Time DME Agency Contacted: [phone] Representative spoke with at DME Agency: Leroy [place] HH Arranged: PT [place] Date [place]: 12/04/18 Time [place]: 59 Representative spoke with at [place]: Cassie   Social Determinants of Health ([place]) Interventions    Readmission Risk Interventions No flowsheet data found.

## 2018-12-06 NOTE — Progress Notes (Signed)
   Subjective: 3 Days Post-Op Procedure(s) (LRB): RIGHT TOTAL KNEE ARTHROPLASTY (Right) Patient back to her baseline.  No confusion.  She is doing well.  Pain control. Denies any CP, SOB, ABD pain. We will continue therapy today.  Plan is to go Home after hospital stay.  Objective: Vital signs in last 24 hours: Temp:  [97.7 F (36.5 C)-98.4 F (36.9 C)] 98.4 F (36.9 C) (08/13 2349) Pulse Rate:  [70-72] 72 (08/13 2349) Resp:  [16-18] 16 (08/13 2349) BP: (146-160)/(73-75) 146/73 (08/13 2349) SpO2:  [100 %] 100 % (08/13 2349)  Intake/Output from previous day: 08/13 0701 - 08/14 0700 In: 360 [P.O.:360] Out: 25 [Drains:25] Intake/Output this shift: No intake/output data recorded.  Recent Labs    12/04/18 0512 12/05/18 0406 12/06/18 0344  HGB 9.8* 10.3* 8.8*   Recent Labs    12/05/18 0406 12/06/18 0344  WBC 8.2 7.4  RBC 3.47* 2.96*  HCT 31.9* 26.8*  PLT 237 207   Recent Labs    12/05/18 0406 12/06/18 0344  NA 136 136  K 3.4* 4.1  CL 106 108  CO2 22 22  BUN 23 29*  CREATININE 1.11* 1.03*  GLUCOSE 97 99  CALCIUM 8.6* 8.5*   No results for input(s): LABPT, INR in the last 72 hours.  EXAM General - Patient is Alert, Appropriate and Oriented Extremity - Neurovascular intact Sensation intact distally Intact pulses distally Dorsiflexion/Plantar flexion intact No cellulitis present Compartment soft Dressing - dressing C/D/I and scant drainage Motor Function - intact, moving foot and toes well on exam.   Past Medical History:  Diagnosis Date  . Arthritis    hands  . 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 Depression   . Hyperlipemia   . Hypertension    h/o had gastric bypass and was able to come off meds  . Pulmonary embolism ([place])   . Sleep apnea    no cpap since her gastric bypass    Assessment/Plan:   3 Days Post-Op Procedure(s) (LRB): RIGHT TOTAL KNEE  ARTHROPLASTY (Right) Active Problems:   Status post total knee replacement using cement, right Altered mental status Estimated body mass index is 22.05 kg/m as calculated from the following:   Height as of this encounter: 5\' 8"  (1.727 m).   Weight as of this encounter: 65.8 kg. Advance diet Up with therapy  Acute post op blood loss anemia -continue oral iron supplement.   Continue with Tylenol as needed for pain. Care management to assist with discharge to skilled nursing facility today.  DVT Prophylaxis - Foot Pumps, TED hose and Eliquis Weight-Bearing as tolerated to right leg   T. Rachelle Hora, PA-C [place] 12/06/2018, 8:12 AM

## 2018-12-06 NOTE — TOC Transition Note (Signed)
Transition of Care [facility]) - CM/SW Discharge Note   Patient Details  Name: Ann Russell MRN: [id number] Date of Birth: Aug 27, 1940  Transition of Care [facility]) CM/SW Contact:  Su Hilt, RN Phone Number: 12/06/2018, 11:31 AM   Clinical Narrative:    The patient is to discharge to [facility] room 31 B today, the nurse is to call report once the Covid test results come back and the Nurse to call for EMS transport.  Daughter Benjamine Mola was made aware  Final next level of care: Skilled Nursing Facility Barriers to Discharge: Barriers Resolved   Patient Goals and CMS Choice Patient states their goals for this hospitalization and ongoing recovery are:: Go home CMS Medicare.gov Compare Post Acute Care list provided to:: Patient Choice offered to / list presented to : Patient  Discharge Placement                       Discharge Plan and Services   Discharge Planning Services: CM Consult Post Acute Care Choice: Home Health          DME Arranged: Walker rolling DME Agency: [employer] Date DME Agency Contacted: 12/04/18 Time DME Agency Contacted: [phone] Representative spoke with at DME Agency: [place]: PT [place] Date [place]: 12/04/18 Time [place]: [phone] Representative spoke with at [place]: Cassie  Social Determinants of Health ([place]) Interventions     Readmission Risk Interventions No flowsheet data found.

## 2018-12-06 NOTE — Discharge Instructions (Signed)

## 2018-12-06 NOTE — Discharge Summary (Addendum)
Physician Discharge Summary  Patient ID: Ann Russell MRN: [id number] DOB/AGE: Oct 17, 1940 78 y.o.  Admit date: 12/03/2018 Discharge date: 12/06/2018  Admission Diagnoses:  PRIMARY OSTEOARTHRITIS OF RIGHT KNEE   Discharge Diagnoses: Patient Active Problem List   Diagnosis Date Noted  . 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 Shoulder pain, acute 07/27/2017  . Hypertension 07/24/2017  . Pulmonary embolism ([place]) 06/21/2017  . 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 Arthritis    hands  . 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 Depression   . Hyperlipemia   . Hypertension    h/o had gastric bypass and was able to come off meds  . Pulmonary embolism ([place])   . Sleep apnea    no cpap since her gastric bypass     Transfusion: None   Consultants (if any):   Discharged Condition: Improved  Hospital Course: Ann Russell is an 78 y.o. female who was admitted 12/03/2018 with a diagnosis of right knee osteoarthritis and went to the operating room on 12/03/2018 and underwent the above named procedures.    Surgeries: Procedure(s): RIGHT TOTAL KNEE ARTHROPLASTY on 12/03/2018 Patient tolerated the surgery well. Taken to PACU where she was stabilized and then transferred to the orthopedic floor.  Started on Eliquis. Foot pumps applied bilaterally at 80 mm. Heels elevated on bed with rolled towels. No evidence of DVT. Negative Homan. Physical therapy started on day #1 for gait training and transfer. OT started day #1 for ADL and assisted devices.  Patient's foley was d/c on day #1. Patient's IV  was d/c on day #2.  On postop day 2, patient with altered mental status.  Patient showing signs of confusion.  Urinalysis was negative.  Vital signs stable.  All narcotics were discontinued patient improved significantly throughout the  day.  By postop day 3 patient was back to baseline doing well with no signs of confusion.  On post op day #3 patient was stable and ready for discharge to skilled nursing facility.     TOTAL KNEE REPLACEMENT POSTOPERATIVE DIRECTIONS  Knee Rehabilitation, Guidelines Following Surgery  Results after knee surgery are often greatly improved when you follow the exercise, range of motion and muscle strengthening exercises prescribed by your doctor. Safety measures are also important to protect the knee from further injury. Any time any of these exercises cause you to have increased pain or swelling in your knee joint, decrease the amount until you are comfortable again and slowly increase them. If you have problems or questions, call your caregiver or physical therapist for advice.   HOME CARE INSTRUCTIONS  Remove items at home which could result in a fall. This includes throw rugs or furniture in walking pathways.   Continue to use polar care unit on the knee for pain and swelling from surgery. You may notice swelling that will progress down to the foot and ankle.  This is normal after surgery.  Elevate the leg when you are not up walking on it.    Continue to use the breathing machine which will help keep your temperature down.  It is common for your temperature to cycle up and down following surgery, especially at night when you are not up moving around and exerting yourself.  The breathing machine keeps your lungs expanded and your  temperature down.  Do not place pillow under knee, focus on keeping the knee STRAIGHT while resting  DIET You may resume your previous home diet once your are discharged from the hospital.  DRESSING / WOUND CARE / SHOWERING Please remove provena negative pressure dressing on 12/13/2018 and apply honey comb dressing. Keep dressing clean and dry at all times.  ACTIVITY Walk with your walker as instructed. Use walker as long as suggested by your caregivers. Avoid  periods of inactivity such as sitting longer than an hour when not asleep. This helps prevent blood clots.  You may resume a sexual relationship in one month or when given the OK by your doctor.  You may return to work once you are cleared by your doctor.  Do not drive a car for 6 weeks or until released by you surgeon.  Do not drive while taking narcotics.  WEIGHT BEARING Weight bearing as tolerated with assist device (walker, cane, etc) as directed, use it as long as suggested by your surgeon or therapist, typically at least 4-6 weeks.  POSTOPERATIVE CONSTIPATION PROTOCOL Constipation - defined medically as fewer than three stools per week and severe constipation as less than one stool per week.  One of the most common issues patients have following surgery is constipation.  Even if you have a regular bowel pattern at home, your normal regimen is likely to be disrupted due to multiple reasons following surgery.  Combination of anesthesia, postoperative narcotics, change in appetite and fluid intake all can affect your bowels.  In order to avoid complications following surgery, here are some recommendations in order to help you during your recovery period.  Colace (docusate) - Pick up an over-the-counter form of Colace or another stool softener and take twice a day as long as you are requiring postoperative pain medications.  Take with a full glass of water daily.  If you experience loose stools or diarrhea, hold the colace until you stool forms back up.  If your symptoms do not get better within 1 week or if they get worse, check with your doctor.  Dulcolax (bisacodyl) - Pick up over-the-counter and take as directed by the product packaging as needed to assist with the movement of your bowels.  Take with a full glass of water.  Use this product as needed if not relieved by Colace only.   MiraLax (polyethylene glycol) - Pick up over-the-counter to have on hand.  MiraLax is a solution that will  increase the amount of water in your bowels to assist with bowel movements.  Take as directed and can mix with a glass of water, juice, soda, coffee, or tea.  Take if you go more than two days without a movement. Do not use MiraLax more than once per day. Call your doctor if you are still constipated or irregular after using this medication for 7 days in a row.  If you continue to have problems with postoperative constipation, please contact the office for further assistance and recommendations.  If you experience "the worst abdominal pain ever" or develop nausea or vomiting, please contact the office immediatly for further recommendations for treatment.  ITCHING  If you experience itching with your medications, try taking only a single pain pill, or even half a pain pill at a time.  You can also use Benadryl over the counter for itching or also to help with sleep.   TED HOSE STOCKINGS Wear the elastic stockings on both legs for six weeks following surgery during the  day but you may remove then at night for sleeping.  MEDICATIONS See your medication summary on the "After Visit Summary" that the nursing staff will review with you prior to discharge.  You may have some home medications which will be placed on hold until you complete the course of blood thinner medication.  It is important for you to complete the blood thinner medication as prescribed by your surgeon.  Continue your approved medications as instructed at time of discharge.  PRECAUTIONS If you experience chest pain or shortness of breath - call 911 immediately for transfer to the hospital emergency department.  If you develop a fever greater that 101 F, purulent drainage from wound, increased redness or drainage from wound, foul odor from the wound/dressing, or calf pain - CONTACT YOUR SURGEON.                                                   FOLLOW-UP APPOINTMENTS Make sure you keep all of your appointments after your operation with  your surgeon and caregivers. You should call the office at the above phone number and make an appointment for approximately two weeks after the date of your surgery or on the date instructed by your surgeon outlined in the "After Visit Summary".   RANGE OF MOTION AND STRENGTHENING EXERCISES  Rehabilitation of the knee is important following a knee injury or an operation. After just a few days of immobilization, the muscles of the thigh which control the knee become weakened and shrink (atrophy). Knee exercises are designed to build up the tone and strength of the thigh muscles and to improve knee motion. Often times heat used for twenty to thirty minutes before working out will loosen up your tissues and help with improving the range of motion but do not use heat for the first two weeks following surgery. These exercises can be done on a training (exercise) mat, on the floor, on a table or on a bed. Use what ever works the best and is most comfortable for you Knee exercises include:  Leg Lifts - While your knee is still immobilized in a splint or cast, you can do straight leg raises. Lift the leg to 60 degrees, hold for 3 sec, and slowly lower the leg. Repeat 10-20 times 2-3 times daily. Perform this exercise against resistance later as your knee gets better.  Quad and Hamstring Sets - Tighten up the muscle on the front of the thigh (Quad) and hold for 5-10 sec. Repeat this 10-20 times hourly. Hamstring sets are done by pushing the foot backward against an object and holding for 5-10 sec. Repeat as with quad sets.   Leg Slides: Lying on your back, slowly slide your foot toward your buttocks, bending your knee up off the floor (only go as far as is comfortable). Then slowly slide your foot back down until your leg is flat on the floor again.  Angel Wings: Lying on your back spread your legs to the side as far apart as you can without causing discomfort.  A rehabilitation program following serious knee  injuries can speed recovery and prevent re-injury in the future due to weakened muscles. Contact your doctor or a physical therapist for more information on knee rehabilitation.   IF YOU ARE TRANSFERRED TO A SKILLED REHAB FACILITY If the patient is transferred to a  skilled rehab facility following release from the hospital, a list of the current medications will be sent to the facility for the patient to continue.  When discharged from the skilled rehab facility, please have the facility set up the patient's [place] prior to being released. Also, the skilled facility will be responsible for providing the patient with their medications at time of release from the facility to include their pain medication, the muscle relaxants, and their blood thinner medication. If the patient is still at the rehab facility at time of the two week follow up appointment, the skilled rehab facility will also need to assist the patient in arranging follow up appointment in our office and any transportation needs.  MAKE SURE YOU:  Understand these instructions.  Get help right away if you are not doing well or get worse.             Implants: Medacta GMK sphere system with 3+ femur right, 3 tibia right, 14 mm insert with short stem and 2 patella, all components cemented  She was given perioperative antibiotics:  Anti-infectives (From admission, onward)   Start     Dose/Rate Route Frequency Ordered Stop   12/03/18 1045  ceFAZolin (ANCEF) IVPB 2g/100 mL premix     2 g 200 mL/hr over 30 Minutes Intravenous Every 6 hours 12/03/18 1038 12/03/18 2244   12/03/18 1038  valACYclovir (VALTREX) tablet 1,000 mg     1,000 mg Oral Daily PRN 12/03/18 1038     12/03/18 0640  ceFAZolin (ANCEF) 2-4 GM/100ML-% IVPB    Note to Pharmacy: Lyman Bishop   : cabinet override      12/03/18 0640 12/03/18 0736   12/02/18 2215  ceFAZolin (ANCEF) IVPB 2g/100 mL premix     2 g 200 mL/hr over 30 Minutes  Intravenous  Once 12/02/18 2205 12/03/18 0741    .  She was given sequential compression devices, early ambulation, and Eliquis, teds for DVT prophylaxis.  She benefited maximally from the hospital stay and there were no complications.    Recent vital signs:  Vitals:   12/05/18 1542 12/05/18 2349  BP: (!) 160/75 (!) 146/73  Pulse: 70 72  Resp: 18 16  Temp: 97.7 F (36.5 C) 98.4 F (36.9 C)  SpO2: 100% 100%    Recent laboratory studies:  Lab Results  Component Value Date   HGB 8.8 (L) 12/06/2018   HGB 10.3 (L) 12/05/2018   HGB 9.8 (L) 12/04/2018   Lab Results  Component Value Date   WBC 7.4 12/06/2018   PLT 207 12/06/2018   Lab Results  Component Value Date   INR 1.5 (H) 11/28/2018   Lab Results  Component Value Date   NA 136 12/06/2018   K 4.1 12/06/2018   CL 108 12/06/2018   CO2 22 12/06/2018   BUN 29 (H) 12/06/2018   CREATININE 1.03 (H) 12/06/2018   GLUCOSE 99 12/06/2018    Discharge Medications:   Allergies as of 12/06/2018   No Known Allergies     Medication List    TAKE these medications   acetaminophen 650 MG CR tablet Commonly known as: TYLENOL Take 650 mg by mouth 2 (two) times a day.   apixaban 5 MG Tabs tablet Commonly known as: Eliquis Take 1 tablet (5 mg total) by mouth 2 (two) times daily.   B COMPLEX VITAMINS SL Place 1 tablet under the tongue daily.   bisacodyl 5 MG EC tablet Commonly known as: DULCOLAX Take 1  tablet (5 mg total) by mouth daily as needed for moderate constipation.   busPIRone 15 MG tablet Commonly known as: BUSPAR Take 15 mg by mouth 2 (two) times daily.   calcitRIOL 0.25 MCG capsule Commonly known as: ROCALTROL Take 0.25 mcg by mouth daily.   CALCIUM 600 + D PO Take 1 tablet by mouth daily.   diphenhydramine-acetaminophen 25-500 MG Tabs tablet Commonly known as: TYLENOL PM Take 1 tablet by mouth at bedtime.   ferrous [id number] C-folic acid capsule Commonly known as: TRINSICON /  FOLTRIN Take 1 capsule by mouth 2 (two) times daily.   FLUoxetine 20 MG capsule Commonly known as: PROZAC Take 60 mg by mouth every morning.   HYDROcodone-acetaminophen 5-325 MG tablet Commonly known as: NORCO/VICODIN Take 1 tablet by mouth every 6 (six) hours as needed for moderate pain (pain score 4-6).   Multi-Vitamins Tabs Take 2 tablets by mouth daily.   pregabalin 100 MG capsule Commonly known as: LYRICA Take 100 mg by mouth 3 (three) times daily.   valACYclovir 1000 MG tablet Commonly known as: VALTREX Take 1,000 mg by mouth daily as needed (fever blisters).   VITAMIN B6 PO Take 1 tablet by mouth daily. With b12            Durable Medical Equipment  (From admission, onward)         Start     Ordered   12/03/18 1039  DME Walker rolling  Once    Question:  Patient needs a walker to treat with the following condition  Answer:  Status post total knee replacement using cement, right   12/03/18 1038   12/03/18 1039  DME 3 n 1  Once     12/03/18 1038   12/03/18 1039  DME Bedside commode  Once    Question:  Patient needs a bedside commode to treat with the following condition  Answer:  Status post total knee replacement using cement, right   12/03/18 1038          Diagnostic Studies: Dg Knee 1-2 Views Right  Result Date: 12/03/2018 CLINICAL DATA:  Postop RIGHT knee EXAM: RIGHT KNEE - 1-2 VIEW COMPARISON:  None. FINDINGS: RIGHT knee arthroplasty hardware in place. Hardware appears intact and appropriately positioned. Osseous alignment is anatomic. Expected postsurgical changes within the overlying soft tissues. IMPRESSION: Status post RIGHT knee arthroplasty. Hardware appears intact and appropriately positioned. No evidence of surgical complicating feature. Electronically Signed   By: Franki Cabot M.D.   On: 12/03/2018 09:52    Disposition:     Follow-up Information    Duanne Guess, PA-C Follow up in 2 week(s).   Specialties: Orthopedic Surgery,  Emergency Medicine Contact information: [place] [address] [phone]            Signed: Feliberto Gottron 12/06/2018, 8:16 AM

## 2018-12-06 NOTE — Progress Notes (Signed)
Physical Therapy Treatment Patient Details Name: Ann Russell MRN: [id number] DOB: Oct 27, 1940 Today's Date: 12/06/2018    History of Present Illness Pt admitted for R TKR. History includes HTN, sleep apnea, and depression. 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    PT Comments    Pt is making good progress towards goals with hopeful plans for dc today to SNF. Able to perform OOB mobility with improved functional independence this date, however still limited by weakness/pain. Unable to tolerate ambulation. Trials without KI donned and able to stand limited time upright. Would recommend use of KI for ambulation/gait trials. Will continue to progress. Cognition much improved this date.   Follow Up Recommendations  SNF     Equipment Recommendations  Rolling walker with 5" wheels    Recommendations for Other Services       Precautions / Restrictions Precautions Precautions: Fall;Knee Precaution Booklet Issued: Yes (comment) Restrictions Weight Bearing Restrictions: Yes RLE Weight Bearing: Weight bearing as tolerated    Mobility  Bed Mobility Overal bed mobility: Needs Assistance Bed Mobility: Supine to Sit     Supine to sit: Min assist Sit to supine: Mod assist   General bed mobility comments: needs cues for sequencing and initiation of movement. Improved functional independence with therapist primarily assisting with B LE. Once seated at EOB, able to sit with supervision. Improved tolerance with R knee flexion allowing foot flat on ground  Transfers Overall transfer level: Needs assistance Equipment used: Rolling walker (2 wheeled) Transfers: Sit to/from Stand Sit to Stand: Mod assist         General transfer comment: able to transfer x 1 attempt with assist for foot placement. No KI donned this session. Able to stand at bedside for approx 20 seconds prior to fatigue. Requests to return back supine,   Ambulation/Gait              General Gait Details: deferred this session   Stairs             Wheelchair Mobility    Modified Rankin (Stroke Patients Only)       Balance Overall balance assessment: Needs assistance Sitting-balance support: Bilateral upper extremity supported Sitting balance-Leahy Scale: Good     Standing balance support: Bilateral upper extremity supported Standing balance-Leahy Scale: Fair                              Cognition Arousal/Alertness: Awake/alert Behavior During Therapy: WFL for tasks assessed/performed Overall Cognitive Status: Within Functional Limits for tasks assessed                                        Exercises Total Joint Exercises Goniometric ROM: R knee AAROM: 0-67 degrees Other Exercises Other Exercises: supine ther-ex performed on R LE including limited AP, quad sets, SLRs, hip abd/add, and SAQ. All ther-ex performed x 15 reps with cues for technique and min/mod assist    General Comments        Pertinent Vitals/Pain Pain Assessment: 0-10 Pain Score: 4  Pain Location: R knee Pain Descriptors / Indicators: Operative site guarding;Aching;Sore Pain Intervention(s): Limited activity within patient's tolerance;Ice applied    Home Living                      Prior  Function            PT Goals (current goals can now be found in the care plan section) Acute Rehab PT Goals Patient Stated Goal: to go home PT Goal Formulation: With patient Time For Goal Achievement: 12/17/18 Potential to Achieve Goals: Good Progress towards PT goals: Progressing toward goals    Frequency    BID      PT Plan Current plan remains appropriate    Co-evaluation              AM-PAC PT "6 Clicks" Mobility   Outcome Measure  Help needed turning from your back to your side while in a flat bed without using bedrails?: A Little Help needed moving from lying on your back to sitting on the side of a  flat bed without using bedrails?: A Little Help needed moving to and from a bed to a chair (including a wheelchair)?: A Lot Help needed standing up from a chair using your arms (e.g., wheelchair or bedside chair)?: A Lot Help needed to walk in hospital room?: A Lot Help needed climbing 3-5 steps with a railing? : Total 6 Click Score: 13    End of Session Equipment Utilized During Treatment: Gait belt Activity Tolerance: Patient tolerated treatment well Patient left: in bed;with call bell/phone within reach;with bed alarm set Nurse Communication: Mobility status PT Visit Diagnosis: Muscle weakness (generalized) (M62.81);Difficulty in walking, not elsewhere classified (R26.2);Pain Pain - Right/Left: Right Pain - part of body: Knee     Time: 5830-9407 PT Time Calculation (min) (ACUTE ONLY): 25 min  Charges:  $Therapeutic Exercise: 8-22 mins $Therapeutic Activity: 8-22 mins                     Greggory Stallion, PT, DPT [phone]    Yahmir Sokolov 12/06/2018, 11:23 AM

## 2018-12-06 NOTE — Care Management Important Message (Signed)
Important Message  Patient Details  Name: Ann Russell MRN: [id number] Date of Birth: 10/26/1940   Medicare Important Message Given:  Yes     Dannette Barbara 12/06/2018, 10:24 AM

## 2018-12-06 NOTE — Progress Notes (Signed)
Report called to Deatra Ina, LPN at [facility]. Patient awaiting EMS for discharge

## 2018-12-07 LAB — URINE CULTURE: Culture: NO GROWTH

## 2019-07-24 ENCOUNTER — Emergency Department: Payer: Medicare PPO

## 2019-07-24 ENCOUNTER — Other Ambulatory Visit: Payer: Self-pay

## 2019-07-24 ENCOUNTER — Emergency Department
Admission: EM | Admit: 2019-07-24 | Discharge: 2019-07-24 | Disposition: A | Discharge status: Home / Self Care | Payer: Medicare PPO | Attending: Emergency Medicine | Admitting: Emergency Medicine

## 2019-07-24 DIAGNOSIS — Z7901 Long term (current) use of anticoagulants: Secondary | ICD-10-CM | POA: Diagnosis not present

## 2019-07-24 DIAGNOSIS — Z79899 Other long term (current) drug therapy: Secondary | ICD-10-CM | POA: Insufficient documentation

## 2019-07-24 DIAGNOSIS — H5789 Other specified disorders of eye and adnexa: Secondary | ICD-10-CM | POA: Diagnosis present

## 2019-07-24 DIAGNOSIS — H538 Other visual disturbances: Secondary | ICD-10-CM | POA: Diagnosis not present

## 2019-07-24 DIAGNOSIS — R0602 Shortness of breath: Secondary | ICD-10-CM | POA: Diagnosis not present

## 2019-07-24 DIAGNOSIS — Z85828 Personal history of other malignant neoplasm of skin: Secondary | ICD-10-CM | POA: Insufficient documentation

## 2019-07-24 DIAGNOSIS — Z96651 Presence of right artificial knee joint: Secondary | ICD-10-CM | POA: Diagnosis not present

## 2019-07-24 DIAGNOSIS — N183 Chronic kidney disease, stage 3 unspecified: Secondary | ICD-10-CM | POA: Insufficient documentation

## 2019-07-24 DIAGNOSIS — I129 Hypertensive chronic kidney disease with stage 1 through stage 4 chronic kidney disease, or unspecified chronic kidney disease: Secondary | ICD-10-CM | POA: Diagnosis not present

## 2019-07-24 DIAGNOSIS — H02402 Unspecified ptosis of left eyelid: Secondary | ICD-10-CM | POA: Insufficient documentation

## 2019-07-24 LAB — CBC
HCT: 32.3 % — ABNORMAL LOW (ref 36.0–46.0)
Hemoglobin: 10.3 g/dL — ABNORMAL LOW (ref 12.0–15.0)
MCH: 26.8 pg (ref 26.0–34.0)
MCHC: 31.9 g/dL (ref 30.0–36.0)
MCV: 84.1 fL (ref 80.0–100.0)
Platelets: 276 10*3/uL (ref 150–400)
RBC: 3.84 MIL/uL — ABNORMAL LOW (ref 3.87–5.11)
RDW: 17.2 % — ABNORMAL HIGH (ref 11.5–15.5)
WBC: 5.7 10*3/uL (ref 4.0–10.5)
nRBC: 0 % (ref 0.0–0.2)

## 2019-07-24 LAB — BASIC METABOLIC PANEL
Anion gap: 10 (ref 5–15)
BUN: 52 mg/dL — ABNORMAL HIGH (ref 8–23)
CO2: 23 mmol/L (ref 22–32)
Calcium: 9.9 mg/dL (ref 8.9–10.3)
Chloride: 101 mmol/L (ref 98–111)
Creatinine, Ser: 1.68 mg/dL — ABNORMAL HIGH (ref 0.44–1.00)
GFR calc Af Amer: 33 mL/min — ABNORMAL LOW (ref >60)
GFR calc non Af Amer: 29 mL/min — ABNORMAL LOW (ref >60)
Glucose, Bld: 93 mg/dL (ref 70–99)
Potassium: 5.2 mmol/L — ABNORMAL HIGH (ref 3.5–5.1)
Sodium: 134 mmol/L — ABNORMAL LOW (ref 135–145)

## 2019-07-24 MED ORDER — SODIUM CHLORIDE 0.9 % IV BOLUS
1000.0000 mL | Freq: Once | INTRAVENOUS | Status: AC
  Administered 2019-07-24: 17:00:00 1000 mL via INTRAVENOUS

## 2019-07-24 MED ORDER — IOHEXOL 350 MG/ML SOLN
75.0000 mL | Freq: Once | INTRAVENOUS | Status: AC | PRN
  Administered 2019-07-24: 19:00:00 75 mL via INTRAVENOUS

## 2019-07-24 NOTE — Discharge Instructions (Signed)
Please follow-up with neurology by calling the number provided for further work-up.  Return to the emergency department for any weakness or numbness of any arm or leg confusion, slurred speech, or any other symptom personally concerning to yourself.

## 2019-07-24 NOTE — ED Provider Notes (Signed)
[facility] Emergency Department Provider Note  Time seen: 4:00 PM  I have reviewed the triage vital signs and the nursing notes.   HISTORY  Chief Complaint Decreased vision L eye   HPI Ann Russell is a 79 y.o. female with a past medical history of arthritis, CKD, depression, hypertension, hyperlipidemia, prior pulmonary embolism 6 months ago currently on Eliquis presents to the emergency department for left visual changes and left eyelid drooping.  According to the patient for approximately 1 month she has been experiencing some mild blurred vision from her left eye as well as eyelid drooping.  Patient went to the eye doctor yesterday and had an eye exam and was told that she needed to go to the emergency department for brain imaging.  Patient states she could not go yesterday but did come today.  Otherwise the patient overall looks well.  States some mild shortness of breath but states this is a chronic issue denies any acute change.  No chest pain.  No cough or fever.  Largely negative review of systems otherwise.  Denies any weakness or numbness of any arm or leg confusion slurred speech or headache.   Past Medical History:  Diagnosis Date  . Arthritis    hands  . 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 Depression   . Hyperlipemia   . Hypertension    h/o had gastric bypass and was able to come off meds  . Pulmonary embolism ([place])   . Sleep apnea    no cpap since her gastric bypass    Patient Active Problem List   Diagnosis Date Noted  . 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 Shoulder pain, acute 07/27/2017  . Hypertension 07/24/2017  . Pulmonary embolism ([place]) 06/21/2017  . 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 ABDOMINAL HYSTERECTOMY    . Buniectomy     bil  .  CHOLECYSTECTOMY    . FACIAL COSMETIC SURGERY  2011  . GASTRIC BYPASS  2010  . PULMONARY THROMBECTOMY Bilateral 06/22/2017   Procedure: PULMONARY THROMBECTOMY;  Surgeon: Algernon Huxley, MD;  Location: [facility];  Service: Cardiovascular;  Laterality: Bilateral;  . Rotor cuff     right  . SCLERAL BUCKLE  11/07/2011   Procedure: SCLERAL BUCKLE;  Surgeon: Hayden Pedro, MD;  Location: [facility];  Service: Ophthalmology;  Laterality: Left;  . TOTAL KNEE ARTHROPLASTY Right 12/03/2018   Procedure: RIGHT TOTAL KNEE ARTHROPLASTY;  Surgeon: Hessie Knows, MD;  Location: [facility];  Service: Orthopedics;  Laterality: Right;    Prior to Admission medications   Medication Sig Start Date End Date Taking? Authorizing Provider  acetaminophen (TYLENOL) 650 MG CR tablet Take 650 mg by mouth 2 (two) times a day.    [provider]  apixaban (ELIQUIS) 5 MG TABS tablet Take 1 tablet (5 mg total) by mouth 2 (two) times daily. 06/23/17   Bettey Costa, MD  B COMPLEX VITAMINS SL Place 1 tablet under the tongue daily.    [provider]  bisacodyl (DULCOLAX) 5 MG EC tablet Take 1 tablet (5 mg total) by mouth daily as needed for moderate constipation. 12/06/18   Duanne Guess, PA-C  busPIRone (BUSPAR) 15 MG tablet Take 15 mg by mouth 2 (two) times daily.  06/06/17   [provider]  calcitRIOL (ROCALTROL) 0.25 MCG capsule Take 0.25 mcg by mouth daily.  06/16/17   [provider]  Calcium Carb-Cholecalciferol (CALCIUM 600 + D PO) Take 1 tablet by mouth daily.    [provider]  diphenhydramine-acetaminophen (TYLENOL PM) 25-500 MG TABS Take 1 tablet by mouth at bedtime.     [provider]  ferrous [id number] C-folic acid (TRINSICON / FOLTRIN) capsule Take 1 capsule by mouth 2 (two) times daily. 12/06/18   Duanne Guess, PA-C  FLUoxetine (PROZAC) 20 MG capsule Take 60 mg by mouth every morning.  05/20/17   [provider]   HYDROcodone-acetaminophen (NORCO/VICODIN) 5-325 MG tablet Take 1 tablet by mouth every 6 (six) hours as needed for moderate pain (pain score 4-6). 12/06/18   Duanne Guess, PA-C  Multiple Vitamin (MULTI-VITAMINS) TABS Take 2 tablets by mouth daily.     [provider]  pregabalin (LYRICA) 100 MG capsule Take 100 mg by mouth 3 (three) times daily.    [provider]  Pyridoxine HCl (VITAMIN B6 PO) Take 1 tablet by mouth daily. With b12    [provider]  valACYclovir (VALTREX) 1000 MG tablet Take 1,000 mg by mouth daily as needed (fever blisters).     [provider]    No Known Allergies  Family History  Problem Relation Age of Onset  . Heart disease Mother   . Dementia Mother   . Neuropathy Father   . Diabetes Brother   . Breast cancer Neg Hx     Social History Social History   Tobacco Use  . Smoking status: Never Smoker  . Smokeless tobacco: Never Used  Substance Use Topics  . Alcohol use: No  . Drug use: No    Review of Systems Constitutional: Negative for fever. Eyes: Blurred vision from left eye, mild pressure in the left eye.  Left eyelid drooping. Gastrointestinal: Negative for abdominal pain, vomiting and diarrhea. Genitourinary: Negative for urinary compaints Musculoskeletal: Negative for musculoskeletal complaints Neurological: Negative for headache.  Denies weakness or numbness of any arm or leg. All other ROS negative  ____________________________________________   PHYSICAL EXAM:  VITAL SIGNS: ED Triage Vitals  Enc Vitals Group     BP 07/24/19 1228 115/85     Pulse Rate 07/24/19 1228 68     Resp 07/24/19 1228 18     Temp 07/24/19 1228 98.1 F (36.7 C)     Temp src --      SpO2 07/24/19 1228 99 %     Weight 07/24/19 1229 160 lb (72.6 kg)     Height 07/24/19 1229 5\' 8"  (1.727 m)     Head Circumference --      Peak Flow --      Pain Score 07/24/19 1229 3     Pain Loc --      Pain Edu? --      Excl. in [place]? --      Constitutional: Alert and oriented. Well appearing and in no distress. Eyes: Mild to moderate ptosis of the left eye.  Appears to have equal pupils at this time on my exam.  No other cranial nerve deficit noted. ENT      Head: Normocephalic and atraumatic.      Mouth/Throat: Mucous membranes are moist. Cardiovascular: Normal rate, regular rhythm. Respiratory: Normal respiratory effort without tachypnea nor retractions. Breath sounds are clear  Gastrointestinal: Soft and nontender. No distention.   Musculoskeletal: Nontender with normal range  of motion in all extremities. Neurologic:  Normal speech and language.  Equal grip strength bilaterally 5/5 strength.  No pronator drift.  Patient does have mild to moderate left ptosis otherwise no significant cranial nerve deficit identified.  Sensation is intact and equal. Skin:  Skin is warm, dry and intact.  Psychiatric: Mood and affect are normal.   ____________________________________________   RADIOLOGY  Chest x-ray negative. CTA of the head and neck are largely negative as well.  ____________________________________________   INITIAL IMPRESSION / ASSESSMENT AND PLAN / ED COURSE  Pertinent labs & imaging results that were available during my care of the patient were reviewed by me and considered in my medical decision making (see chart for details).   Patient presents from ophthalmology for concern over possible Horner syndrome.  I spoke to radiologist Dr. Carlis Abbott given the patient's GFR of 29.  He states CTA with contrast would still be the best test to rule out dissection, believes we could use a low-dose contrast.  I will IV hydrate the patient.  Patient agreeable to plan of care.  Patient's neurological exam otherwise appears intact besides the ptosis.  We will obtain CTA imaging of the head and neck down to the carina of the chest.  Also a chest x-ray.  Other differential could include Bell's palsy, or various other causes of Horner  syndrome.  No concerning findings on CT a of the head neck and chest x-ray.  No obvious cause for the patient's ptosis.  Could be more of a Bell's palsy.  We will have the patient follow-up with neurology for further work-up.  Patient agreeable to plan of care.  Ann Russell was evaluated in Emergency Department on 07/24/2019 for the symptoms described in the history of present illness. She was evaluated in the context of the global COVID-19 pandemic, which necessitated consideration that the patient might be at risk for infection with the SARS-CoV-2 virus that causes COVID-19. Institutional protocols and algorithms that pertain to the evaluation of patients at risk for COVID-19 are in a state of rapid change based on information released by regulatory bodies including the CDC and federal and state organizations. These policies and algorithms were followed during the patient's care in the ED.  ____________________________________________   FINAL CLINICAL IMPRESSION(S) / ED DIAGNOSES  Ptosis of left eye   Harvest Dark, MD 07/24/19 1952

## 2019-07-24 NOTE — ED Triage Notes (Signed)
Pt comes via POV from eye doctor with c/o left eye pain and vision blurry.  Pt states this started a month ago. Pt denies any issues with right eye.  Pt states it waters and doesn't open. Pt states surgery on left eye in past.

## 2019-09-20 ENCOUNTER — Emergency Department: Payer: Medicare PPO

## 2019-09-20 ENCOUNTER — Other Ambulatory Visit: Payer: Self-pay

## 2019-09-20 ENCOUNTER — Emergency Department
Admission: EM | Admit: 2019-09-20 | Discharge: 2019-09-20 | Disposition: A | Discharge status: Home / Self Care | Payer: Medicare PPO | Attending: Emergency Medicine | Admitting: Emergency Medicine

## 2019-09-20 DIAGNOSIS — Y929 Unspecified place or not applicable: Secondary | ICD-10-CM | POA: Diagnosis not present

## 2019-09-20 DIAGNOSIS — Z85828 Personal history of other malignant neoplasm of skin: Secondary | ICD-10-CM | POA: Diagnosis not present

## 2019-09-20 DIAGNOSIS — Y939 Activity, unspecified: Secondary | ICD-10-CM | POA: Diagnosis not present

## 2019-09-20 DIAGNOSIS — Z79899 Other long term (current) drug therapy: Secondary | ICD-10-CM | POA: Insufficient documentation

## 2019-09-20 DIAGNOSIS — Z7901 Long term (current) use of anticoagulants: Secondary | ICD-10-CM | POA: Insufficient documentation

## 2019-09-20 DIAGNOSIS — R296 Repeated falls: Secondary | ICD-10-CM | POA: Insufficient documentation

## 2019-09-20 DIAGNOSIS — R531 Weakness: Secondary | ICD-10-CM | POA: Diagnosis not present

## 2019-09-20 DIAGNOSIS — W19XXXA Unspecified fall, initial encounter: Secondary | ICD-10-CM | POA: Insufficient documentation

## 2019-09-20 DIAGNOSIS — S82831A Other fracture of upper and lower end of right fibula, initial encounter for closed fracture: Secondary | ICD-10-CM | POA: Insufficient documentation

## 2019-09-20 DIAGNOSIS — S99921A Unspecified injury of right foot, initial encounter: Secondary | ICD-10-CM | POA: Diagnosis present

## 2019-09-20 DIAGNOSIS — N183 Chronic kidney disease, stage 3 unspecified: Secondary | ICD-10-CM | POA: Insufficient documentation

## 2019-09-20 DIAGNOSIS — Z20822 Contact with and (suspected) exposure to covid-19: Secondary | ICD-10-CM | POA: Insufficient documentation

## 2019-09-20 DIAGNOSIS — R519 Headache, unspecified: Secondary | ICD-10-CM | POA: Diagnosis not present

## 2019-09-20 DIAGNOSIS — Y999 Unspecified external cause status: Secondary | ICD-10-CM | POA: Insufficient documentation

## 2019-09-20 DIAGNOSIS — I129 Hypertensive chronic kidney disease with stage 1 through stage 4 chronic kidney disease, or unspecified chronic kidney disease: Secondary | ICD-10-CM | POA: Diagnosis not present

## 2019-09-20 DIAGNOSIS — Z96651 Presence of right artificial knee joint: Secondary | ICD-10-CM | POA: Insufficient documentation

## 2019-09-20 DIAGNOSIS — R0602 Shortness of breath: Secondary | ICD-10-CM | POA: Diagnosis not present

## 2019-09-20 DIAGNOSIS — S93324A Dislocation of tarsometatarsal joint of right foot, initial encounter: Secondary | ICD-10-CM

## 2019-09-20 LAB — COMPREHENSIVE METABOLIC PANEL
ALT: 19 U/L (ref 0–44)
AST: 29 U/L (ref 15–41)
Albumin: 3.9 g/dL (ref 3.5–5.0)
Alkaline Phosphatase: 40 U/L (ref 38–126)
Anion gap: 9 (ref 5–15)
BUN: 57 mg/dL — ABNORMAL HIGH (ref 8–23)
CO2: 24 mmol/L (ref 22–32)
Calcium: 9.5 mg/dL (ref 8.9–10.3)
Chloride: 102 mmol/L (ref 98–111)
Creatinine, Ser: 1.86 mg/dL — ABNORMAL HIGH (ref 0.44–1.00)
GFR calc Af Amer: 30 mL/min — ABNORMAL LOW (ref >60)
GFR calc non Af Amer: 25 mL/min — ABNORMAL LOW (ref >60)
Glucose, Bld: 98 mg/dL (ref 70–99)
Potassium: 4.8 mmol/L (ref 3.5–5.1)
Sodium: 135 mmol/L (ref 135–145)
Total Bilirubin: 0.6 mg/dL (ref 0.3–1.2)
Total Protein: 7 g/dL (ref 6.5–8.1)

## 2019-09-20 LAB — URINALYSIS, COMPLETE (UACMP) WITH MICROSCOPIC
Bilirubin Urine: NEGATIVE
Glucose, UA: NEGATIVE mg/dL
Hgb urine dipstick: NEGATIVE
Ketones, ur: NEGATIVE mg/dL
Leukocytes,Ua: NEGATIVE
Nitrite: NEGATIVE
Protein, ur: NEGATIVE mg/dL
Specific Gravity, Urine: 1.009 (ref 1.005–1.030)
pH: 6 (ref 5.0–8.0)

## 2019-09-20 LAB — CBC WITH DIFFERENTIAL/PLATELET
Abs Immature Granulocytes: 0.02 10*3/uL (ref 0.00–0.07)
Basophils Absolute: 0 10*3/uL (ref 0.0–0.1)
Basophils Relative: 1 %
Eosinophils Absolute: 0.1 10*3/uL (ref 0.0–0.5)
Eosinophils Relative: 2 %
HCT: 29.5 % — ABNORMAL LOW (ref 36.0–46.0)
Hemoglobin: 9.7 g/dL — ABNORMAL LOW (ref 12.0–15.0)
Immature Granulocytes: 0 %
Lymphocytes Relative: 24 %
Lymphs Abs: 1.5 10*3/uL (ref 0.7–4.0)
MCH: 25.8 pg — ABNORMAL LOW (ref 26.0–34.0)
MCHC: 32.9 g/dL (ref 30.0–36.0)
MCV: 78.5 fL — ABNORMAL LOW (ref 80.0–100.0)
Monocytes Absolute: 0.6 10*3/uL (ref 0.1–1.0)
Monocytes Relative: 9 %
Neutro Abs: 4 10*3/uL (ref 1.7–7.7)
Neutrophils Relative %: 64 %
Platelets: 232 10*3/uL (ref 150–400)
RBC: 3.76 MIL/uL — ABNORMAL LOW (ref 3.87–5.11)
RDW: 18.5 % — ABNORMAL HIGH (ref 11.5–15.5)
WBC: 6.2 10*3/uL (ref 4.0–10.5)
nRBC: 0 % (ref 0.0–0.2)

## 2019-09-20 LAB — SARS CORONAVIRUS 2 BY RT PCR (HOSPITAL ORDER, PERFORMED IN ~~LOC~~ HOSPITAL LAB): SARS Coronavirus 2: NEGATIVE

## 2019-09-20 LAB — TROPONIN I (HIGH SENSITIVITY): Troponin I (High Sensitivity): 9 ng/L (ref <18)

## 2019-09-20 MED ORDER — MORPHINE SULFATE (PF) 4 MG/ML IV SOLN
4.0000 mg | Freq: Once | INTRAVENOUS | Status: AC
  Administered 2019-09-20: 4 mg via INTRAMUSCULAR
  Filled 2019-09-20: qty 1

## 2019-09-20 MED ORDER — MORPHINE SULFATE (PF) 4 MG/ML IV SOLN
4.0000 mg | Freq: Once | INTRAVENOUS | Status: AC
  Administered 2019-09-20: 4 mg via INTRAVENOUS

## 2019-09-20 MED ORDER — MORPHINE SULFATE (PF) 4 MG/ML IV SOLN
4.0000 mg | Freq: Once | INTRAVENOUS | Status: DC
  Filled 2019-09-20: qty 1

## 2019-09-20 MED ORDER — OXYCODONE-ACETAMINOPHEN 5-325 MG PO TABS: 1.0000 | ORAL_TABLET | Freq: Four times a day (QID) | ORAL | 0 refills | Status: DC | PRN

## 2019-09-20 MED ORDER — ONDANSETRON 8 MG PO TBDP
8.0000 mg | ORAL_TABLET | Freq: Once | ORAL | Status: AC
  Administered 2019-09-20: 8 mg via ORAL
  Filled 2019-09-20: qty 1

## 2019-09-20 MED ORDER — OXYCODONE-ACETAMINOPHEN 5-325 MG PO TABS: 1.0000 | ORAL_TABLET | Freq: Once | ORAL | Status: DC

## 2019-09-20 NOTE — ED Triage Notes (Signed)
Pt reports she was bending over and "went too far" and fell, bending right foot towards her ankle. EMS applied ice pack en route and splinted. Pt reports she has hx of neuropathy and while sensation in lower extremities is decreased, it is normal for her and equal bilaterally. Pt denies LOC, denies other injury. Pt reports she's fallen 3 times in the last two days due to weakness and neuropathy. Pt reports she uses walker at home, and takes eliquis.  Pt is AOX4, NAD noted. Swelling noted in right foot/ankle. No obvious deformity noted.

## 2019-09-20 NOTE — ED Provider Notes (Signed)
EKG reviewed inter by me at 1550 Heart rate 60 QRS 100 QTc 410 Normal sinus rhythm, incomplete right bundle.  No evidence of acute ischemia   Delman Kitten, MD 09/20/19 1600

## 2019-09-20 NOTE — ED Provider Notes (Signed)
[facility] Emergency Department Provider Note  ____________________________________________  Time seen: Approximately 1:48 PM  I have reviewed the triage vital signs and the nursing notes.   HISTORY  Chief Complaint Fall and Foot Injury    HPI Ann Russell is a 79 y.o. female who presents to the emergency department via EMS from home for multiple falls.  Patient states that she has had 3 falls in the last 3 days.  Patient states that she does have a history of peripheral neuropathy, this frequently causes her to fall and she is unable to feel on even surfaces.  Patient states that on 2 of the falls she was trying to bend over, subsequently fell.  Yesterday patient states that she hit her head very hard, felt dazed but did not pass out.  She has had a headache since.  Patient states that she fell today but does not remember what caused the fall and does not remember she hit her head or not.  Patient does remember being in the floor today, looking at her leg and visualizing it being at a twisted/awkward angle.  EMS had splinted the patient's ankle, presented with her to the emergency department with no other  interventions performed.  Patient states that she has felt weak, slightly short of breath over the past week.  No URI symptoms.  No chest pain, no abdominal pain, no nausea, vomiting or diarrhea.  No urinary symptoms.  Patient has a history of chronic kidney disease, hypertension, PE and sleep apnea.        Past Medical History:  Diagnosis Date  . Arthritis    hands  . 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 Depression   . Hyperlipemia   . Hypertension    h/o had gastric bypass and was able to come off meds  . Pulmonary embolism ([place])   . Sleep apnea    no cpap since her gastric bypass    Patient Active Problem List   Diagnosis Date Noted  . 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 Shoulder pain, acute 07/27/2017  . Hypertension 07/24/2017  . Pulmonary embolism ([place]) 06/21/2017  . 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 ABDOMINAL HYSTERECTOMY    . Buniectomy     bil  . CHOLECYSTECTOMY    . FACIAL COSMETIC SURGERY  2011  . GASTRIC BYPASS  2010  . PULMONARY THROMBECTOMY Bilateral 06/22/2017   Procedure: PULMONARY THROMBECTOMY;  Surgeon: Algernon Huxley, MD;  Location: [place] [facility];  Service: Cardiovascular;  Laterality: Bilateral;  . Rotor cuff     right  . SCLERAL BUCKLE  11/07/2011   Procedure: SCLERAL BUCKLE;  Surgeon: Hayden Pedro, MD;  Location: [place];  Service: Ophthalmology;  Laterality: Left;  . TOTAL KNEE ARTHROPLASTY Right 12/03/2018   Procedure: RIGHT TOTAL KNEE ARTHROPLASTY;  Surgeon: Hessie Knows, MD;  Location: [facility];  Service: Orthopedics;  Laterality: Right;    Prior to Admission medications   Medication Sig Start Date End Date Taking? Authorizing Provider  acetaminophen (TYLENOL) 650 MG CR tablet Take 650 mg by mouth 2 (two) times a day.    [provider]  apixaban (ELIQUIS) 5 MG TABS tablet Take 1 tablet (5 mg total) by mouth 2 (two) times daily. 06/23/17  Bettey Costa, MD  B COMPLEX VITAMINS SL Place 1 tablet under the tongue daily.    [provider]  bisacodyl (DULCOLAX) 5 MG EC tablet Take 1 tablet (5 mg total) by mouth daily as needed for moderate constipation. 12/06/18   Duanne Guess, PA-C  busPIRone (BUSPAR) 15 MG tablet Take 15 mg by mouth 2 (two) times daily.  06/06/17   [provider]  calcitRIOL (ROCALTROL) 0.25 MCG capsule Take 0.25 mcg by mouth daily.  06/16/17   [provider]  Calcium Carb-Cholecalciferol (CALCIUM 600 + D PO) Take 1 tablet by mouth daily.    [provider]  diphenhydramine-acetaminophen (TYLENOL PM) 25-500 MG TABS Take 1 tablet by mouth at bedtime.      [provider]  ferrous [id number] C-folic acid (TRINSICON / FOLTRIN) capsule Take 1 capsule by mouth 2 (two) times daily. 12/06/18   Duanne Guess, PA-C  FLUoxetine (PROZAC) 20 MG capsule Take 60 mg by mouth every morning.  05/20/17   [provider]  HYDROcodone-acetaminophen (NORCO/VICODIN) 5-325 MG tablet Take 1 tablet by mouth every 6 (six) hours as needed for moderate pain (pain score 4-6). 12/06/18   Duanne Guess, PA-C  Multiple Vitamin (MULTI-VITAMINS) TABS Take 2 tablets by mouth daily.     [provider]  oxyCODONE-acetaminophen (PERCOCET/ROXICET) 5-325 MG tablet Take 1 tablet by mouth every 6 (six) hours as needed for severe pain. 09/20/19   Posey Petrik, Charline Bills, PA-C  pregabalin (LYRICA) 100 MG capsule Take 100 mg by mouth 3 (three) times daily.    [provider]  Pyridoxine HCl (VITAMIN B6 PO) Take 1 tablet by mouth daily. With b12    [provider]  valACYclovir (VALTREX) 1000 MG tablet Take 1,000 mg by mouth daily as needed (fever blisters).     [provider]    Allergies Patient has no known allergies.  Family History  Problem Relation Age of Onset  . Heart disease Mother   . Dementia Mother   . Neuropathy Father   . Diabetes Brother   . Breast cancer Neg Hx     Social History Social History   Tobacco Use  . Smoking status: Never Smoker  . Smokeless tobacco: Never Used  Substance Use Topics  . Alcohol use: No  . Drug use: No     Review of Systems  Constitutional: No fever/chills.  Positive for generalized weakness. Eyes: No visual changes. No discharge ENT: No upper respiratory complaints. Cardiovascular: no chest pain. Respiratory: no cough.  Reported mild SOB. Gastrointestinal: No abdominal pain.  No nausea, no vomiting.  No diarrhea.  No constipation. Genitourinary: Negative for dysuria. No hematuria Musculoskeletal: Right ankle/foot injury Skin: Negative for rash, abrasions,  lacerations, ecchymosis. Neurological: Positive for posttraumatic headache.  Denies focal weakness or numbness. 10-point ROS otherwise negative.  ____________________________________________   PHYSICAL EXAM:  VITAL SIGNS: ED Triage Vitals  Enc Vitals Group     BP 09/20/19 1328 (!) 151/81     Pulse Rate 09/20/19 1328 63     Resp 09/20/19 1328 16     Temp 09/20/19 1328 98.3 F (36.8 C)     Temp Source 09/20/19 1328 Oral     SpO2 09/20/19 1328 100 %     Weight 09/20/19 1329 170 lb (77.1 kg)     Height 09/20/19 1329 5\' 7"  (1.702 m)     Head Circumference --      Peak Flow --      Pain Score  09/20/19 1328 3     Pain Loc --      Pain Edu? --      Excl. in [place]? --      Constitutional: Alert and oriented. Well appearing and in no acute distress. Eyes: Conjunctivae are normal. PERRL. EOMI. Head: Atraumatic.  No visible signs of trauma with abrasions, lacerations, ecchymosis.  No battle signs.  No raccoon eyes.  No serosanguineous fluid drainage from ears or nares. ENT:      Ears:       Nose: No congestion/rhinnorhea.      Mouth/Throat: Mucous membranes are moist.  Neck: No stridor.  No cervical spine tenderness to palpation.  Cardiovascular: Normal rate, regular rhythm. Normal S1 and S2.  Good peripheral circulation. Respiratory: Normal respiratory effort without tachypnea or retractions. Lungs CTAB. Good air entry to the bases with no decreased or absent breath sounds. Musculoskeletal: Full range of motion to all extremities. No gross deformities appreciated.  Visualization of the right foot and ankle reveals no significant edema, ecchymosis, deformity appreciated.  Patient does have mild bilateral lower extremity edema.  No range of motion is performed at this time.  Patient has decreased sensation bilateral lower extremities below the knee.  No significant tenderness reported to palpation of the osseous structures of the foot and ankle.  No palpable abnormality or crepitus on  palpation.  Dorsalis pedis pulse intact.  Again sensation is decreased, however decreased equally bilaterally.  This is chronic. Neurologic:  Normal speech and language. No gross focal neurologic deficits are appreciated.  Cranial nerves II through XII grossly intact. Skin:  Skin is warm, dry and intact. No rash noted. Psychiatric: Mood and affect are normal. Speech and behavior are normal. Patient exhibits appropriate insight and judgement.   ____________________________________________   LABS (all labs ordered are listed, but only abnormal results are displayed)  Labs Reviewed  COMPREHENSIVE METABOLIC PANEL - Abnormal; Notable for the following components:      Result Value   BUN 57 (*)    Creatinine, Ser 1.86 (*)    GFR calc non Af Amer 25 (*)    GFR calc Af Amer 30 (*)    All other components within normal limits  CBC WITH DIFFERENTIAL/PLATELET - Abnormal; Notable for the following components:   RBC 3.76 (*)    Hemoglobin 9.7 (*)    HCT 29.5 (*)    MCV 78.5 (*)    MCH 25.8 (*)    RDW 18.5 (*)    All other components within normal limits  URINALYSIS, COMPLETE (UACMP) WITH MICROSCOPIC - Abnormal; Notable for the following components:   Color, Urine YELLOW (*)    APPearance CLEAR (*)    Bacteria, UA RARE (*)    All other components within normal limits  SARS CORONAVIRUS 2 BY RT PCR (HOSPITAL ORDER, [facility] [facility])  TROPONIN I (HIGH SENSITIVITY)  TROPONIN I (HIGH SENSITIVITY)   ____________________________________________  EKG   ____________________________________________  RADIOLOGY I personally viewed and evaluated these images as part of my medical decision making, as well as reviewing the written report by the radiologist.  DG Chest 2 View  Result Date: 09/20/2019 CLINICAL DATA:  Shortness of breath.  Multiple falls. EXAM: CHEST - 2 VIEW COMPARISON:  July 24, 2019 FINDINGS: The heart size and mediastinal contours are within normal limits.  Both lungs are clear. The visualized skeletal structures are unremarkable. IMPRESSION: No active cardiopulmonary disease. Electronically Signed   By: Dorise Bullion III M.D  On: 09/20/2019 15:14   DG Tibia/Fibula Right  Result Date: 09/20/2019 CLINICAL DATA:  Pain EXAM: RIGHT TIBIA AND FIBULA - 2 VIEW COMPARISON:  None. FINDINGS: Again noted is an acute, minimally displaced avulsion fracture from the tip of the distal fibula. There is soft tissue swelling about the ankle. The patient is status post prior total knee arthroplasty. There is mild lucency about the tibial component which appears essentially stable from immediate postop imaging and is of doubtful clinical significance. There is no evidence for hardware fracture or failure. IMPRESSION: Minimal displaced avulsion fracture arising from the distal tip of the fibula. Electronically Signed   By: Constance Holster M.D.   On: 09/20/2019 15:12   DG Ankle Complete Right  Result Date: 09/20/2019 CLINICAL DATA:  Pain EXAM: RIGHT ANKLE - COMPLETE 3+ VIEW COMPARISON:  None. FINDINGS: There is soft tissue swelling about the ankle, most notably about the lateral malleolus. There is a minimally displaced avulsion fracture at the tip of the distal fibula. There is an additional well corticated osseous fragment adjacent to the lateral talus which is favored to represent sequela of an old remote injury. IMPRESSION: Minimally displaced avulsion fracture from the distal tip of the lateral malleolus. Electronically Signed   By: Constance Holster M.D.   On: 09/20/2019 15:10   CT Head Wo Contrast  Result Date: 09/20/2019 CLINICAL DATA:  Recent fall with headaches and neck pain, initial encounter EXAM: CT HEAD WITHOUT CONTRAST CT CERVICAL SPINE WITHOUT CONTRAST TECHNIQUE: Multidetector CT imaging of the head and cervical spine was performed following the standard protocol without intravenous contrast. Multiplanar CT image reconstructions of the cervical spine were  also generated. COMPARISON:  None. FINDINGS: CT HEAD FINDINGS Brain: No evidence of acute infarction, hemorrhage, hydrocephalus, extra-axial collection or mass lesion/mass effect. Vascular: No hyperdense vessel or unexpected calcification. Skull: Normal. Negative for fracture or focal lesion. Sinuses/Orbits: No acute finding. Other: None. CT CERVICAL SPINE FINDINGS Alignment: Mild loss of the normal cervical lordosis is noted. This may be related to muscular spasm. Skull base and vertebrae: 7 cervical segments are well visualized. Vertebral body height is well maintained. Mild disc space narrowing from C4-C7 is noted. Osteophytic changes are seen. No acute fracture or acute facet abnormality is noted. Mild facet hypertrophic changes are noted. Soft tissues and spinal canal: Surrounding soft tissue structures are within normal limits. Upper chest: Visualized lung apices are within normal limits. Other: None IMPRESSION: CT of the head: No acute intracranial abnormality noted. CT of the cervical spine: No acute abnormality noted. Multilevel degenerative changes are seen Electronically Signed   By: Inez Catalina M.D.   On: 09/20/2019 14:41   CT Cervical Spine Wo Contrast  Result Date: 09/20/2019 CLINICAL DATA:  Recent fall with headaches and neck pain, initial encounter EXAM: CT HEAD WITHOUT CONTRAST CT CERVICAL SPINE WITHOUT CONTRAST TECHNIQUE: Multidetector CT imaging of the head and cervical spine was performed following the standard protocol without intravenous contrast. Multiplanar CT image reconstructions of the cervical spine were also generated. COMPARISON:  None. FINDINGS: CT HEAD FINDINGS Brain: No evidence of acute infarction, hemorrhage, hydrocephalus, extra-axial collection or mass lesion/mass effect. Vascular: No hyperdense vessel or unexpected calcification. Skull: Normal. Negative for fracture or focal lesion. Sinuses/Orbits: No acute finding. Other: None. CT CERVICAL SPINE FINDINGS Alignment: Mild  loss of the normal cervical lordosis is noted. This may be related to muscular spasm. Skull base and vertebrae: 7 cervical segments are well visualized. Vertebral body height is well maintained. Mild disc space  narrowing from C4-C7 is noted. Osteophytic changes are seen. No acute fracture or acute facet abnormality is noted. Mild facet hypertrophic changes are noted. Soft tissues and spinal canal: Surrounding soft tissue structures are within normal limits. Upper chest: Visualized lung apices are within normal limits. Other: None IMPRESSION: CT of the head: No acute intracranial abnormality noted. CT of the cervical spine: No acute abnormality noted. Multilevel degenerative changes are seen Electronically Signed   By: Inez Catalina M.D.   On: 09/20/2019 14:41   CT Foot Right Wo Contrast  Result Date: 09/20/2019 CLINICAL DATA:  Foot pain secondary to a fall. Abnormal radiographs. EXAM: CT OF THE RIGHT FOOT WITHOUT CONTRAST TECHNIQUE: Multidetector CT imaging of the right foot was performed according to the standard protocol. Multiplanar CT image reconstructions were also generated. COMPARISON:  Radiographs dated 09/20/2019 FINDINGS: Bones/Joint/Cartilage There are multiple avulsion fractures of tip of the lateral malleolus, likely including the origin of the anterior talofibular ligament. There are small avulsion fractures from the dorsal distal aspect of the medial cuneiform as well as from the medial aspect of the base of the first metatarsal and of the plantar aspect of the base of the first metatarsal. There is an avulsion of bone from the lateral aspect of the distal medial cuneiform consistent with disruption of the Lisfranc ligament at that site. No abnormal widening of the joint space. There is also a small avulsion from the lateral aspect of the base of the third metatarsal consistent with the disruption of the ligament between the bases of the second and third metatarsals. The calcaneus is intact. The  cuboid and talus and navicular are intact. Diffuse osteopenia. Ligaments Suboptimally assessed by CT. The deltoid ligament is intact. The lateral ligaments are not well enough seen for assessment. Muscles and Tendons The tendons around the ankle are intact including the Achilles tendon. Plantar fascia appears normal. Soft tissues Soft tissue edema in the subcutaneous fat primarily at the lateral aspect of the ankle. IMPRESSION: 1. Multiple avulsion fractures of the tip of the lateral malleolus, likely including the origin of the anterior talofibular ligament. 2. Multiple avulsion fractures of the dorsal distal aspect of the medial cuneiform as well as from the medial aspect of the base of the first metatarsal and of the plantar aspect of the base of the first metatarsal. 3. Avulsion of bone from the lateral aspect of the distal medial cuneiform consistent with disruption of the Lisfranc ligament at that site. 4. Small avulsion from the lateral aspect of the base of the third metatarsal consistent with the disruption of the ligament between the bases of the second and third metatarsals. Electronically Signed   By: Lorriane Shire M.D.   On: 09/20/2019 17:03   DG Foot Complete Right  Result Date: 09/20/2019 CLINICAL DATA:  Pain after fall. EXAM: RIGHT FOOT COMPLETE - 3+ VIEW COMPARISON:  None. FINDINGS: The proximal interphalangeal joints of the second and third digits are fused. There is irregularity along the proximal fourth interphalangeal joint, likely nonacute. No acute fractures are seen. There is a lucency, horizontally oriented, at the posterior aspect of the calcaneus. This is not seen on the tibial and fibular film however, and may be artifactual. IMPRESSION: 1. Horizontally oriented lucency at the back of the calcaneus on the lateral view, not confirmed on the tibial and fibular films, possibly artifactual. Recommend clinical correlation. If there is concern for a calcaneus fracture, recommend dedicated  imaging. 2. Chronic changes as above. 3. No other evidence of  acute fracture. Electronically Signed   By: Dorise Bullion III M.D   On: 09/20/2019 15:13    ____________________________________________    PROCEDURES  Procedure(s) performed:    .Splint Application  Date/Time: 09/20/2019 6:40 PM Performed by: Darletta Moll, PA-C Authorized by: Darletta Moll, PA-C   Consent:    Consent obtained:  Verbal   Consent given by:  Patient   Risks discussed:  Pain and swelling Pre-procedure details:    Pre-procedure CMS: Decreased sensation bilaterally due to peripheral neuropathy.  Unchanged from baseline. Procedure details:    Laterality:  Right   Location:  Foot   Foot:  R foot   Splint type:  Short leg and ankle stirrup   Supplies:  Cotton padding, Ortho-Glass and elastic bandage Post-procedure details:    Pain:  Improved   Post-procedure CMS: Decreased sensation bilaterally.  History of peripheral neuropathy.  Unchanged from baseline.   Patient tolerance of procedure:  Tolerated well, no immediate complications      Medications  morphine 4 MG/ML injection 4 mg (4 mg Intramuscular Given 09/20/19 1549)  ondansetron (ZOFRAN-ODT) disintegrating tablet 8 mg (8 mg Oral Given 09/20/19 1549)  morphine 4 MG/ML injection 4 mg (4 mg Intravenous Given 09/20/19 1834)     ____________________________________________   INITIAL IMPRESSION / ASSESSMENT AND PLAN / ED COURSE  Pertinent labs & imaging results that were available during my care of the patient were reviewed by me and considered in my medical decision making (see chart for details).  Review of the [place] CSRS was performed in accordance of the [place] prior to dispensing any controlled drugs.           Patient's diagnosis is consistent with fall, Lisfranc injury.  Patient presented to emergency department complaining of foot injury, weakness, multiple falls.  Patient sustained a fall today where she states that she  had been her foot and significant dorsiflexion.  No gross deformities.  Initial imaging revealed distal fibular fracture, concern for calcaneal fracture.  This was not quite consistent with the patient's injury, however with this concern CT scan was ordered.  CT returns with findings consistent with a Lisfranc injury without associated fracture pattern.  However there does appear to be a rupture of the Lisfranc ligament.  At this time, patient foot will be splinted.  I discussed management at home as patient is to be completely nonweightbearing.  After a very lengthy discussion, patient determines that she would like to go home.  She has a wheelchair there but will attempt to get other assistive devices such as a motorized scooter.. Patient will be discharged home with prescriptions for Percocet. Patient is to follow up with orthopedics and podiatry as needed or otherwise directed. Patient is given ED precautions to return to the ED for any worsening or new symptoms.     ____________________________________________  FINAL CLINICAL IMPRESSION(S) / ED DIAGNOSES  Final diagnoses:  Fall, initial encounter  Dislocation of tarsometatarsal joint of right foot, initial encounter  Other closed fracture of distal end of right fibula, initial encounter      NEW MEDICATIONS STARTED DURING THIS VISIT:  ED Discharge Orders         Ordered    For home use only DME Wheelchair electric     09/20/19 1831    oxyCODONE-acetaminophen (PERCOCET/ROXICET) 5-325 MG tablet  Every 6 hours PRN     09/20/19 1839              This chart was dictated  using voice recognition software/Dragon. Despite best efforts to proofread, errors can occur which can change the meaning. Any change was purely unintentional.    Darletta Moll, PA-C 09/20/19 1844    Delman Kitten, MD 09/21/19 [phone]

## 2019-11-05 DIAGNOSIS — Z86711 Personal history of pulmonary embolism: Secondary | ICD-10-CM | POA: Insufficient documentation

## 2019-12-18 IMAGING — CR DG CHEST 2V
2 series · 2 of 2 positions shown · non-contrast
Comparison: None.

CLINICAL DATA: Shortness of breath.  Evaluate for pneumonia.

EXAM:
CHEST  2 VIEW

[chest lat]
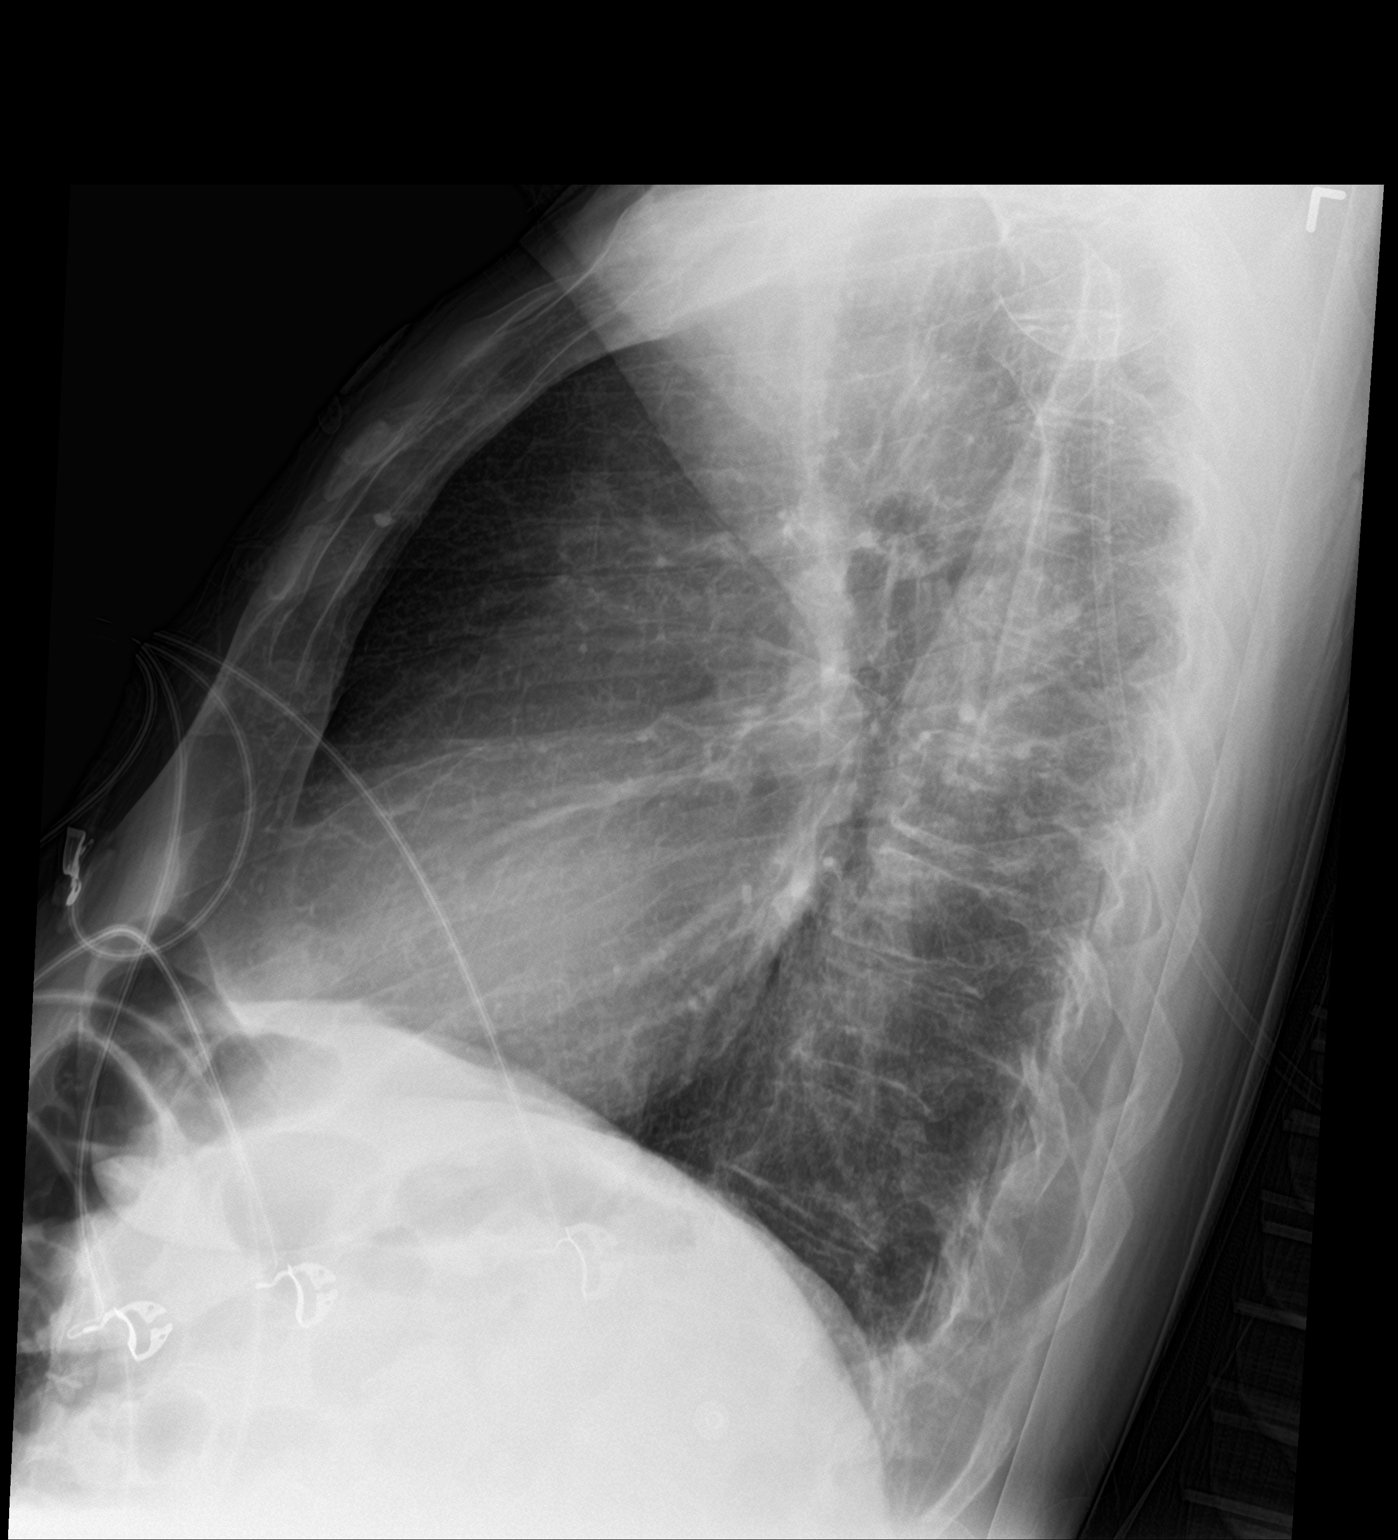

[chest ap]
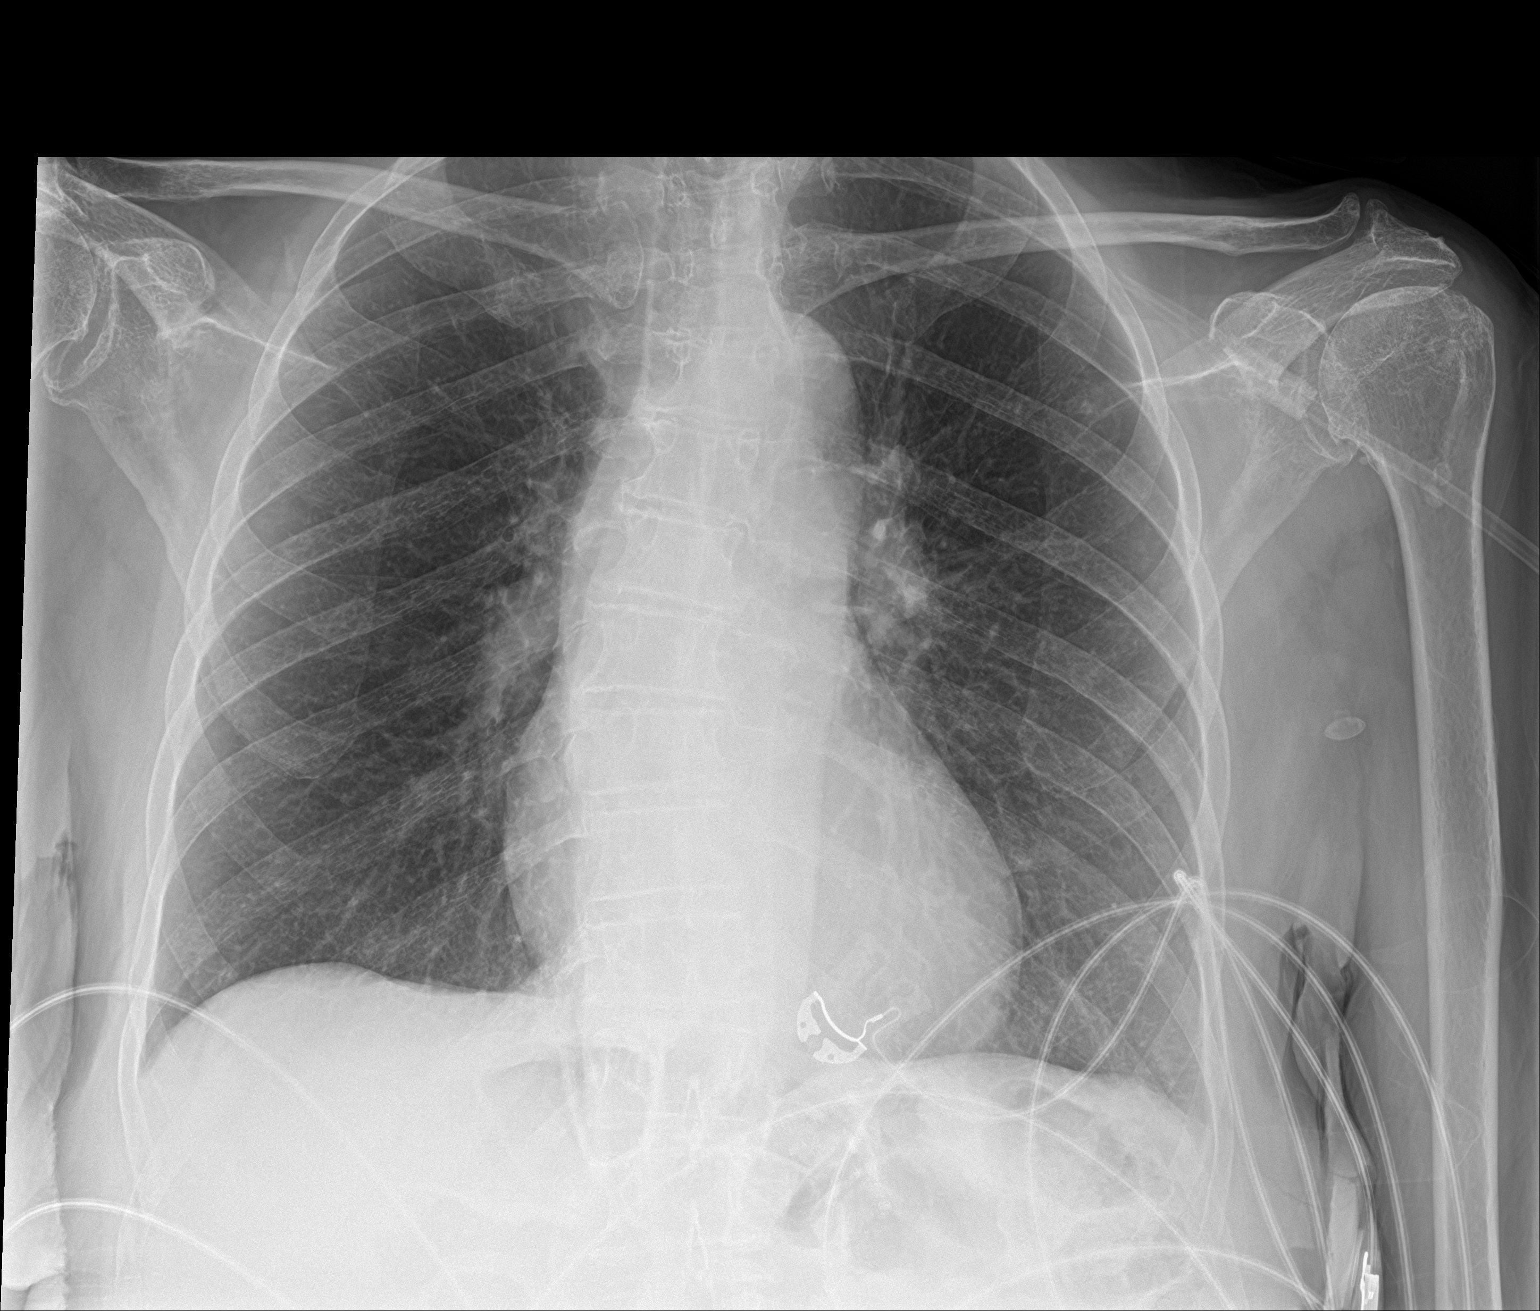

[2 of 2 positions shown; findings below may reference images not displayed]

FINDINGS: The heart size and mediastinal contours are within normal limits.
Normal pulmonary vascularity. No focal consolidation, pleural
effusion, or pneumothorax. No acute osseous abnormality.
IMPRESSION: No active cardiopulmonary disease.

## 2019-12-18 IMAGING — CT CT ANGIO CHEST
2 of 6 series · 17 of 46 positions shown · IV contrast (APPLIED)
Comparison: None.

CLINICAL DATA: Shortness of breath for 3 days with generalized
weakness.

EXAM:
CT ANGIOGRAPHY CHEST WITH CONTRAST
TECHNIQUE: Multidetector CT imaging of the chest was performed using the
standard protocol during bolus administration of intravenous
contrast. Multiplanar CT image reconstructions and MIPs were
obtained to evaluate the vascular anatomy.
CONTRAST:  60mL DDLU4N-TIJ IOPAMIDOL (DDLU4N-TIJ) INJECTION 76%

[Series 5: thins · axial · 0.73mm/px · z∈[-901,-604]mm · 14 of 327 slices shown]
[im 15/327  lung]
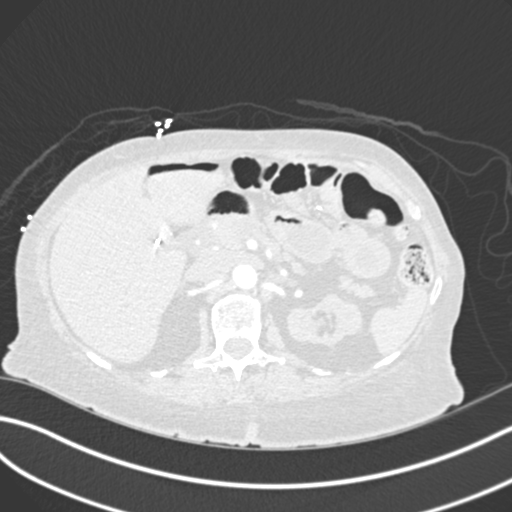
[im 43/327  soft-tissue]
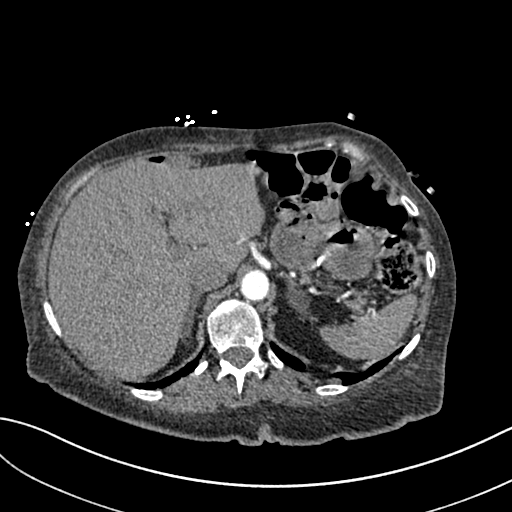
[im 57/327  lung]
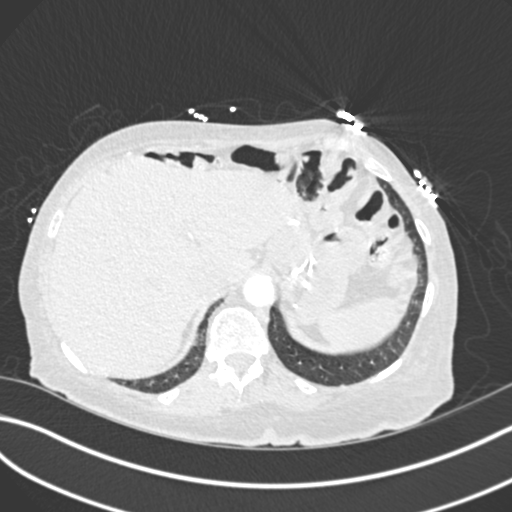
[im 86/327  soft-tissue]
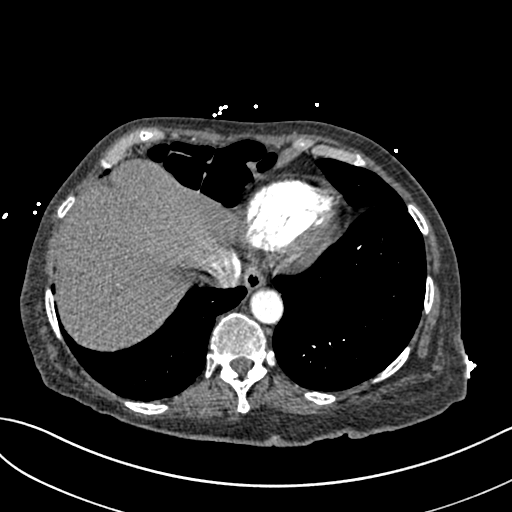
[im 114/327  lung]
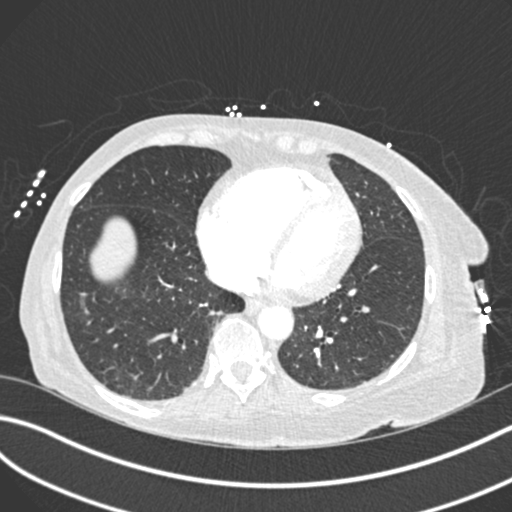
[im 128/327  soft-tissue]
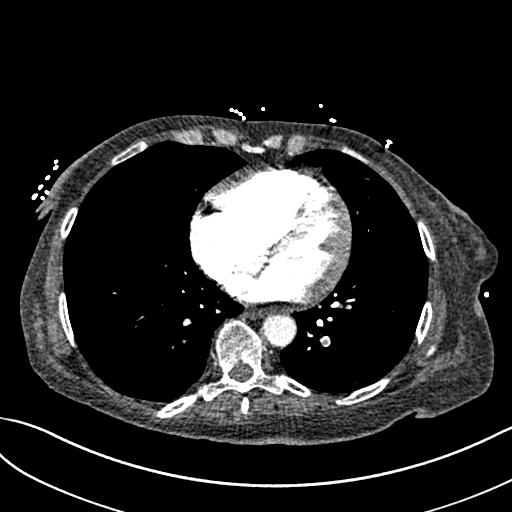
[im 156/327  lung]
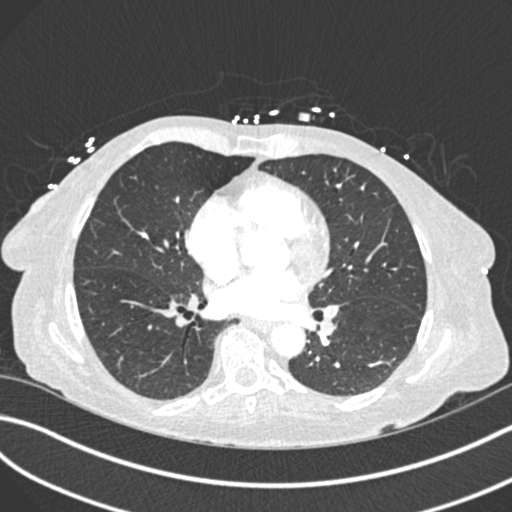
[im 171/327  soft-tissue]
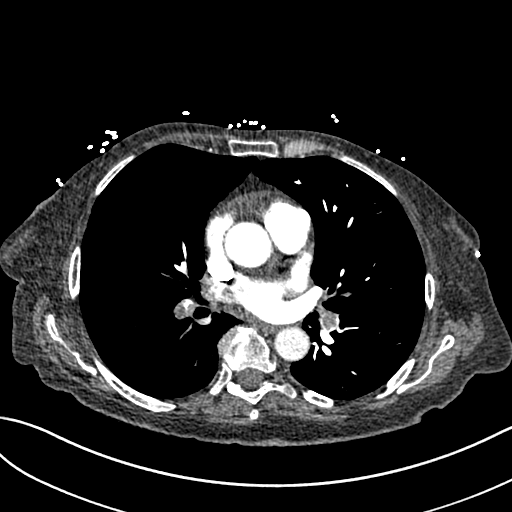
[im 199/327  lung]
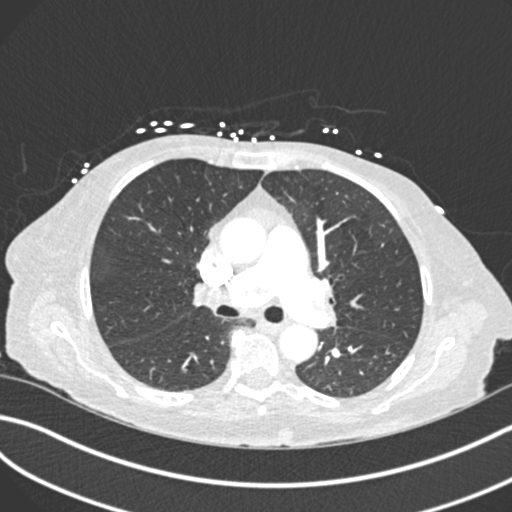
[im 213/327  soft-tissue]
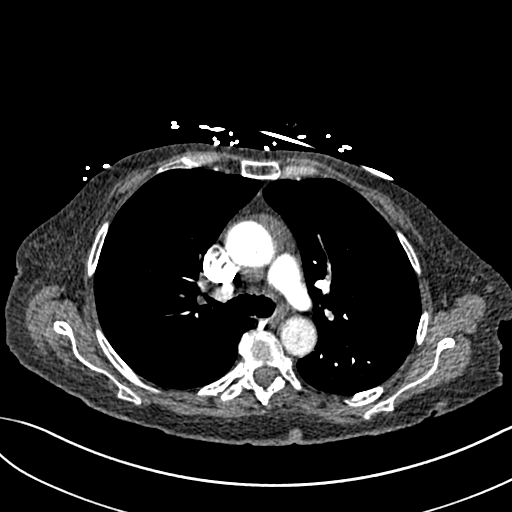
[im 241/327  lung]
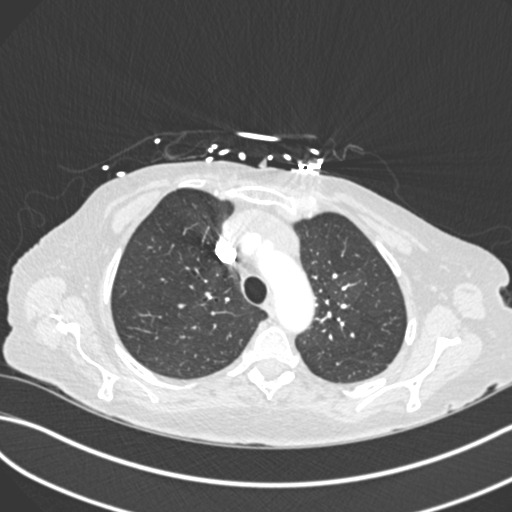
[im 270/327  soft-tissue]
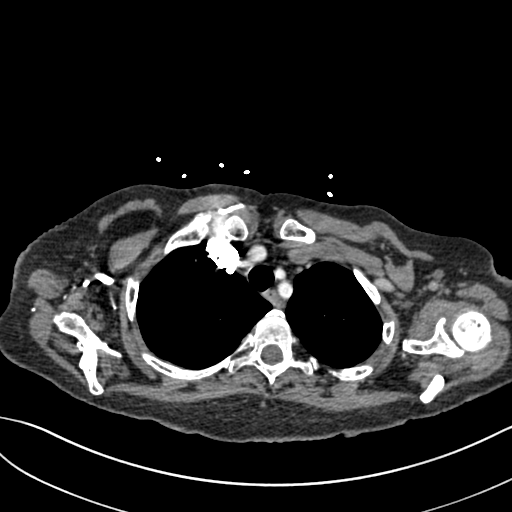
[im 284/327  lung]
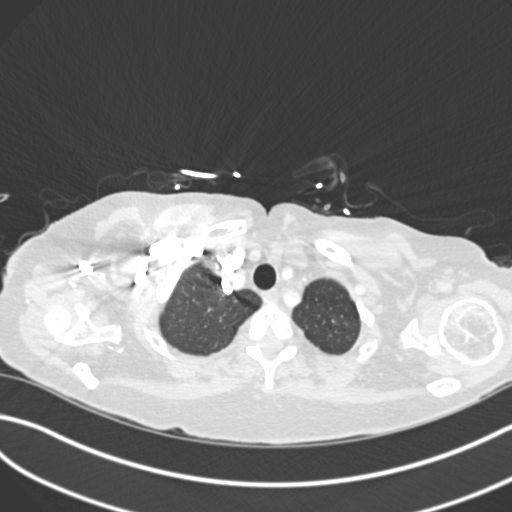
[im 312/327  soft-tissue]
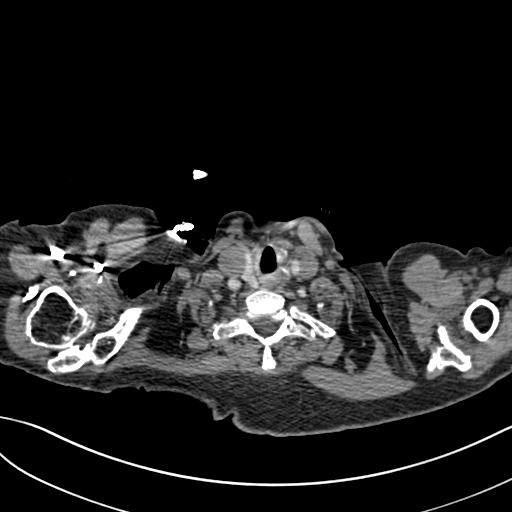

[Series 7: coronal mpr · coronal · 0.64mm/px · 3 of 80 slices shown]
[im 20/80  soft-tissue]
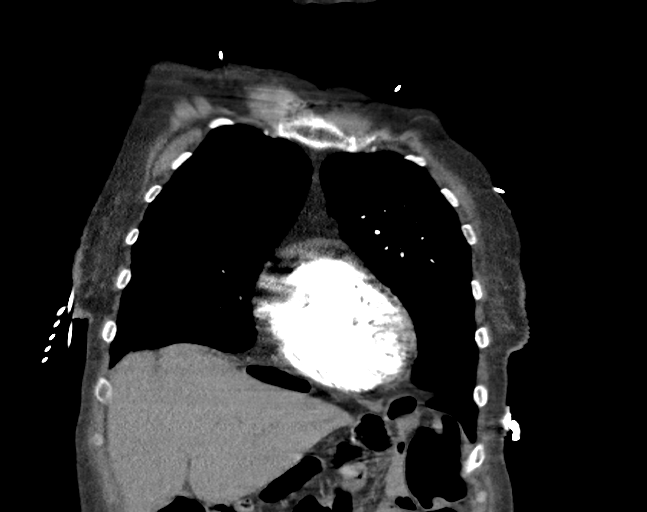
[im 40/80  soft-tissue]
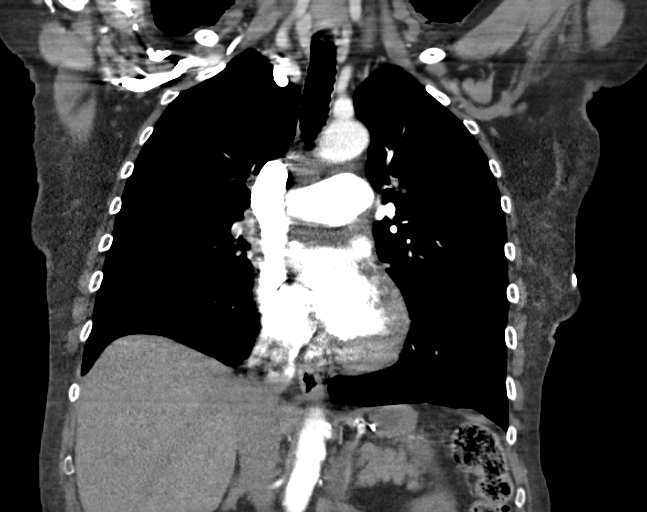
[im 60/80  soft-tissue]
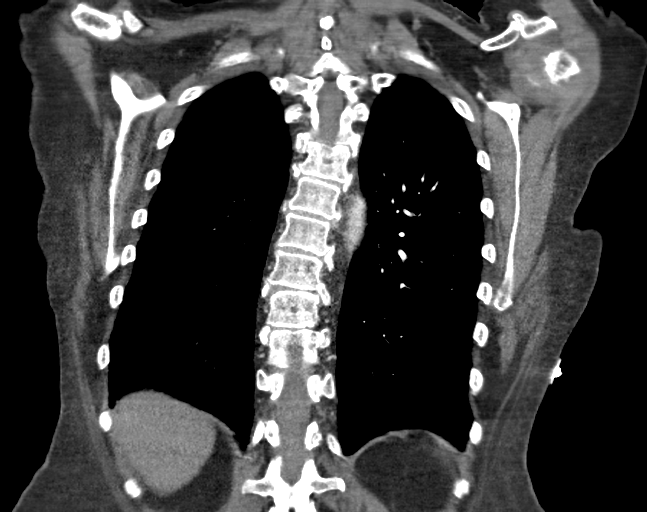

[17 of 46 positions shown; findings below may reference images not displayed]

FINDINGS: Cardiovascular: Tiny pericardial effusion noted. Heart size normal.
No thoracic aortic aneurysm. Large volume pulmonary embolus
identified with saddle configuration crossing the pulmonary artery
bifurcation. Occlusive and nearly occlusive pulmonary embolus is
seen in lobar and segmental arteries to the lower lobes and right
middle lobe. Pulmonary embolus also noted in segmental branches to
the left upper lobe. RV/LV ratio is 1.3.

Mediastinum/Nodes: No mediastinal lymphadenopathy. There is no hilar
lymphadenopathy. The esophagus has normal imaging features. There is
no axillary lymphadenopathy.

Lungs/Pleura: No dense focal airspace consolidation. Subtle
peripheral ground-glass attenuation seen anterior upper lobes
bilaterally with atelectasis noted in the lingula. No pulmonary
edema or pleural effusion. No definite pulmonary infarct. 4 mm right
upper lobe pulmonary nodule identified on image 43 of series 6. 3 mm
right upper lobe pulmonary nodule seen on image 29. 2 mm left apical
nodule visible on image 16 with other scattered tiny pulmonary
nodules evident.

Upper Abdomen: Surgical changes noted in the stomach. Kidneys
incompletely visualized with cortical scarring noted in the left
kidney.

Musculoskeletal: Bone windows reveal no worrisome lytic or sclerotic
osseous lesions.

Review of the MIP images confirms the above findings.
IMPRESSION: 1. Large saddle pulmonary embolus with evidence of right heart
strain. Positive for acute PE with CT evidence of right heart strain
(RV/LV Ratio = 1.3) consistent with at least submassive
(intermediate risk) PE. The presence of right heart strain has been
associated with an increased risk of morbidity and mortality. Please
activate Code PE by paging [phone].
2. Tiny bilateral pulmonary nodules measuring up to 4 mm maximum
size. No follow-up needed if patient is low-risk (and has no known
or suspected primary neoplasm). Non-contrast chest CT can be
considered in 12 months if patient is high-risk. This recommendation
follows the consensus statement: Guidelines for Management of
Incidental Pulmonary Nodules Detected on CT Images: From the

Critical Value/emergent results were called by me at the time of
interpretation on 06/21/2017 at [DATE] to Dr. Panaghwthc, who
verbally acknowledged these results.

## 2020-02-10 ENCOUNTER — Ambulatory Visit (INDEPENDENT_AMBULATORY_CARE_PROVIDER_SITE_OTHER): Payer: Medicare PPO | Admitting: Vascular Surgery

## 2020-02-10 ENCOUNTER — Other Ambulatory Visit: Payer: Self-pay

## 2020-02-10 ENCOUNTER — Encounter (INDEPENDENT_AMBULATORY_CARE_PROVIDER_SITE_OTHER): Payer: Self-pay | Admitting: Vascular Surgery

## 2020-02-10 VITALS — BP 116/70 | HR 80 | Ht 67.0 in

## 2020-02-10 DIAGNOSIS — E785 Hyperlipidemia, unspecified: Secondary | ICD-10-CM | POA: Diagnosis not present

## 2020-02-10 DIAGNOSIS — M7989 Other specified soft tissue disorders: Secondary | ICD-10-CM | POA: Insufficient documentation

## 2020-02-10 DIAGNOSIS — M79605 Pain in left leg: Secondary | ICD-10-CM

## 2020-02-10 DIAGNOSIS — B001 Herpesviral vesicular dermatitis: Secondary | ICD-10-CM | POA: Insufficient documentation

## 2020-02-10 DIAGNOSIS — I2602 Saddle embolus of pulmonary artery with acute cor pulmonale: Secondary | ICD-10-CM

## 2020-02-10 DIAGNOSIS — N183 Chronic kidney disease, stage 3 unspecified: Secondary | ICD-10-CM | POA: Diagnosis not present

## 2020-02-10 DIAGNOSIS — F419 Anxiety disorder, unspecified: Secondary | ICD-10-CM | POA: Insufficient documentation

## 2020-02-10 DIAGNOSIS — I10 Essential (primary) hypertension: Secondary | ICD-10-CM | POA: Diagnosis not present

## 2020-02-10 DIAGNOSIS — N189 Chronic kidney disease, unspecified: Secondary | ICD-10-CM | POA: Insufficient documentation

## 2020-02-10 DIAGNOSIS — G4730 Sleep apnea, unspecified: Secondary | ICD-10-CM | POA: Insufficient documentation

## 2020-02-10 DIAGNOSIS — M79604 Pain in right leg: Secondary | ICD-10-CM

## 2020-02-10 DIAGNOSIS — M79609 Pain in unspecified limb: Secondary | ICD-10-CM | POA: Insufficient documentation

## 2020-02-10 DIAGNOSIS — F32A Depression, unspecified: Secondary | ICD-10-CM | POA: Insufficient documentation

## 2020-02-10 NOTE — Assessment & Plan Note (Signed)
blood pressure control important in reducing the progression of atherosclerotic disease. On appropriate oral medications.  

## 2020-02-10 NOTE — Assessment & Plan Note (Signed)
Status post thrombectomy and intervention 2-1/2 years ago. Doing well.  Still on Eliquis

## 2020-02-10 NOTE — Assessment & Plan Note (Signed)
The patient has significant swelling.  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  A venous reflux study will be done in the near future at her convenience.  Compression stockings daily as well as elevation increasing activity would be important for conservative therapies.  We will see her back following her noninvasive studies.

## 2020-02-10 NOTE — Assessment & Plan Note (Signed)
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  Were going to obtain noninvasive studies in the near future at her convenience.  Recommend daily use of compression stockings and leg elevation as well as increasing activity.  Her Lyrica does help her neuropathy.  We will see her back following her noninvasive studies.

## 2020-02-10 NOTE — Patient Instructions (Signed)
Edema  Edema is when you have too much fluid in your body or under your skin. Edema may make your legs, feet, and ankles swell up. Swelling is also common in looser tissues, like around your eyes. This is a common condition. It gets more common as you get older. There are many possible causes of edema. Eating too much salt (sodium) and being on your feet or sitting for a long time can cause edema in your legs, feet, and ankles. Hot weather may make edema worse. Edema is usually painless. Your skin may look swollen or shiny. Follow these instructions at home:  Keep the swollen body part raised (elevated) above the level of your heart when you are sitting or lying down.  Do not sit still or stand for a long time.  Do not wear tight clothes. Do not wear garters on your upper legs.  Exercise your legs. This can help the swelling go down.  Wear elastic bandages or support stockings as told by your doctor.  Eat a low-salt (low-sodium) diet to reduce fluid as told by your doctor.  Depending on the cause of your swelling, you may need to limit how much fluid you drink (fluid restriction).  Take over-the-counter and prescription medicines only as told by your doctor. Contact a doctor if:  Treatment is not working.  You have heart, liver, or kidney disease and have symptoms of edema.  You have sudden and unexplained weight gain. Get help right away if:  You have shortness of breath or chest pain.  You cannot breathe when you lie down.  You have pain, redness, or warmth in the swollen areas.  You have heart, liver, or kidney disease and get edema all of a sudden.  You have a fever and your symptoms get worse all of a sudden. Summary  Edema is when you have too much fluid in your body or under your skin.  Edema may make your legs, feet, and ankles swell up. Swelling is also common in looser tissues, like around your eyes.  Raise (elevate) the swollen body part above the level of your  heart when you are sitting or lying down.  Follow your doctor's instructions about diet and how much fluid you can drink (fluid restriction). This information is not intended to replace advice given to you by your health care provider. Make sure you discuss any questions you have with your health care provider. Document Revised: 04/13/2017 Document Reviewed: 04/28/2016 Elsevier Patient Education  2020 [employer]  

## 2020-02-10 NOTE — Progress Notes (Signed)
Patient ID: Ann Russell, female   DOB: 18-Apr-1941, 79 y.o.   MRN: [id number]  Chief Complaint  Patient presents with   Follow-up    sparks. varisose veins Ls    HPI Ann Russell is a 79 y.o. female.  I am asked to see the patient by Dr. Doy Hutching for evaluation of leg pain, swelling, and discoloration.  The patient has a previous history of DVT and pulmonary embolus that was treated with pulmonary thrombectomy about 2-1/2 years ago.  She has done very well from this.  She has no subsequent chest pain or shortness of breath.  Her legs however, have provided her with a lot of pain and swelling over the past couple of years.  This is associated with some darkening discoloration and more prominent varicosities.  Her legs have gotten to the point where they hurt almost all the time.  The pain progresses throughout the day.  Activity has become difficult and even walking short distances brings on worsening pain.  She tries to elevate her legs with only mild improvement.  She does have some chronic kidney disease and other medical issues as described below.  No open wounds or ulceration.  No fever or chills.  She has been previously told she had neuropathy as well and does take Lyrica.   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  Takes Vasotec for kidneys.-stage 3   Depression    Hyperlipemia    Hypertension    h/o had gastric bypass and was able to come off meds   Pulmonary embolism ([place])    Sleep apnea    no cpap since her gastric bypass    Past Surgical History:  Procedure Laterality Date   ABDOMINAL HYSTERECTOMY     Buniectomy     bil   CHOLECYSTECTOMY     FACIAL COSMETIC SURGERY  2011   GASTRIC BYPASS  2010   PULMONARY THROMBECTOMY Bilateral 06/22/2017   Procedure: PULMONARY THROMBECTOMY;  Surgeon: Algernon Huxley, MD;  Location: [facility];  Service:  Cardiovascular;  Laterality: Bilateral;   Rotor cuff     right   SCLERAL BUCKLE  11/07/2011   Procedure: SCLERAL BUCKLE;  Surgeon: Hayden Pedro, MD;  Location: [facility];  Service: Ophthalmology;  Laterality: Left;   TOTAL KNEE ARTHROPLASTY Right 12/03/2018   Procedure: RIGHT TOTAL KNEE ARTHROPLASTY;  Surgeon: Hessie Knows, MD;  Location: [facility];  Service: Orthopedics;  Laterality: Right;     Family History  Problem Relation Age of Onset   Heart disease Mother    Dementia Mother    Neuropathy Father    Diabetes Brother    Breast cancer Neg Hx      Social History   Tobacco Use   Smoking status: Never Smoker   Smokeless tobacco: Never Used  Vaping Use   Vaping Use: Never used  Substance Use Topics   Alcohol use: No   Drug use: No    No Known Allergies  Current Outpatient Medications  Medication Sig Dispense Refill   acetaminophen (TYLENOL) 650 MG CR tablet Take 650 mg by mouth 2 (two) times a day.     apixaban (ELIQUIS) 5 MG TABS tablet Take 1 tablet (5 mg total) by mouth 2 (two) times daily. 60 tablet 0   B COMPLEX VITAMINS  SL Place 1 tablet under the tongue daily.     bisacodyl (DULCOLAX) 5 MG EC tablet Take 1 tablet (5 mg total) by mouth daily as needed for moderate constipation. 30 tablet 0   busPIRone (BUSPAR) 15 MG tablet Take 15 mg by mouth 2 (two) times daily.      calcitRIOL (ROCALTROL) 0.25 MCG capsule Take 0.25 mcg by mouth daily.      Calcium Carb-Cholecalciferol (CALCIUM 600 + D PO) Take 1 tablet by mouth daily.     diphenhydramine-acetaminophen (TYLENOL PM) 25-500 MG TABS Take 1 tablet by mouth at bedtime.      ferrous [id number] C-folic acid (TRINSICON / FOLTRIN) capsule Take 1 capsule by mouth 2 (two) times daily. 20 capsule    FLUoxetine (PROZAC) 20 MG capsule Take 60 mg by mouth every morning.      HYDROcodone-acetaminophen (NORCO/VICODIN) 5-325 MG tablet Take 1 tablet by mouth every 6 (six) hours as needed for moderate  pain (pain score 4-6). 20 tablet 0   Multiple Vitamin (MULTI-VITAMINS) TABS Take 2 tablets by mouth daily.      oxyCODONE-acetaminophen (PERCOCET/ROXICET) 5-325 MG tablet Take 1 tablet by mouth every 6 (six) hours as needed for severe pain. 20 tablet 0   pregabalin (LYRICA) 100 MG capsule Take 100 mg by mouth 3 (three) times daily.     Pyridoxine HCl (VITAMIN B6 PO) Take 1 tablet by mouth daily. With b12     valACYclovir (VALTREX) 1000 MG tablet Take 1,000 mg by mouth daily as needed (fever blisters).      No current facility-administered medications for this visit.      REVIEW OF SYSTEMS (Negative unless checked)  Constitutional: [] Weight loss  [] Fever  [] Chills Cardiac: [] Chest pain   [] Chest pressure   [] Palpitations   [] Shortness of breath when laying flat   [] Shortness of breath at rest   [] Shortness of breath with exertion. Vascular:  [x] Pain in legs with walking   [x] Pain in legs at rest   [] Pain in legs when laying flat   [] Claudication   [] Pain in feet when walking  [] Pain in feet at rest  [] Pain in feet when laying flat   [x] History of DVT   [x] Phlebitis   [x] Swelling in legs   [] Varicose veins   [] Non-healing ulcers Pulmonary:   [] Uses home oxygen   [] Productive cough   [] Hemoptysis   [] Wheeze  [] COPD   [] Asthma Neurologic:  [] Dizziness  [] Blackouts   [] Seizures   [] History of stroke   [] History of TIA  [] Aphasia   [] Temporary blindness   [] Dysphagia   [] Weakness or numbness in arms   [x] Weakness or numbness in legs Musculoskeletal:  [x] Arthritis   [] Joint swelling   [x] Joint pain   [] Low back pain Hematologic:  [] Easy bruising  [] Easy bleeding   [] Hypercoagulable state   [] Anemic  [] Hepatitis Gastrointestinal:  [] Blood in stool   [] Vomiting blood  [x] Gastroesophageal reflux/heartburn   [] Abdominal pain Genitourinary:  [] Chronic kidney disease   [] Difficult urination  [] Frequent urination  [] Burning with urination   [] Hematuria Skin:  [] Rashes   [] Ulcers    [] Wounds Psychological:  [] History of anxiety   []  History of major depression.    Physical Exam BP 116/70    Pulse 80    Ht 5\' 7"  (1.702 m)    BMI 26.63 kg/m  Gen:  WD/WN, NAD Head: [place]/AT, No temporalis wasting.  Ear/Nose/Throat: Hearing grossly intact, nares w/o erythema or drainage, oropharynx w/o Erythema/Exudate Eyes: Conjunctiva clear, sclera non-icteric  Neck: trachea  midline.  No JVD.  Pulmonary:  Good air movement, respirations not labored, no use of accessory muscles  Cardiac: RRR, no JVD Vascular:  Vessel Right Left  Radial Palpable Palpable                          DP  1+  1+  PT  not palpable  trace   Gastrointestinal:. No masses, surgical incisions, or scars. Musculoskeletal: M/S 5/5 throughout.  Extremities without ischemic changes.  No deformity or atrophy.  Significant stasis dermatitis changes present to both lower extremities.  Fairly diffuse varicosities.  1-2+ bilateral lower extremity edema. Neurologic: Sensation grossly intact in extremities.  Symmetrical.  Speech is fluent. Motor exam as listed above. Psychiatric: Judgment intact, Mood & affect appropriate for pt's clinical situation. Dermatologic: No rashes or ulcers noted.  No cellulitis or open wounds.    Radiology No results found.  Labs No results found for this or any previous visit (from the past 2160 hour(s)).  Assessment/Plan:  Pulmonary embolism ([place]) Status post thrombectomy and intervention 2-1/2 years ago. Doing well.  Still on Eliquis  Chronic kidney disease Can worsen LE swelling.  Hypertension blood pressure control important in reducing the progression of atherosclerotic disease. On appropriate oral medications.   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  Were going to obtain noninvasive studies in the near future at her convenience.  Recommend daily use of  compression stockings and leg elevation as well as increasing activity.  Her Lyrica does help her neuropathy.  We will see her back following her noninvasive studies.  Swelling of limb The patient has significant swelling.  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  A venous reflux study will be done in the near future at her convenience.  Compression stockings daily as well as elevation increasing activity would be important for conservative therapies.  We will see her back following her noninvasive studies.      Leotis Pain 02/10/2020, 4:29 PM   This note was created with Dragon medical transcription system.  Any errors from dictation are unintentional.

## 2020-02-10 NOTE — Assessment & Plan Note (Signed)
Can worsen LE swelling 

## 2020-02-27 ENCOUNTER — Ambulatory Visit (INDEPENDENT_AMBULATORY_CARE_PROVIDER_SITE_OTHER): Payer: Medicare PPO | Admitting: Nurse Practitioner

## 2020-02-27 ENCOUNTER — Ambulatory Visit (INDEPENDENT_AMBULATORY_CARE_PROVIDER_SITE_OTHER): Payer: Medicare PPO

## 2020-02-27 ENCOUNTER — Encounter (INDEPENDENT_AMBULATORY_CARE_PROVIDER_SITE_OTHER): Payer: Self-pay | Admitting: Nurse Practitioner

## 2020-02-27 ENCOUNTER — Other Ambulatory Visit: Payer: Self-pay

## 2020-02-27 VITALS — BP 117/84 | HR 65 | Resp 16

## 2020-02-27 DIAGNOSIS — R600 Localized edema: Secondary | ICD-10-CM

## 2020-02-27 DIAGNOSIS — M79605 Pain in left leg: Secondary | ICD-10-CM

## 2020-02-27 DIAGNOSIS — E785 Hyperlipidemia, unspecified: Secondary | ICD-10-CM

## 2020-02-27 DIAGNOSIS — M79604 Pain in right leg: Secondary | ICD-10-CM | POA: Diagnosis not present

## 2020-02-27 DIAGNOSIS — M7989 Other specified soft tissue disorders: Secondary | ICD-10-CM

## 2020-02-27 DIAGNOSIS — I10 Essential (primary) hypertension: Secondary | ICD-10-CM

## 2020-02-27 DIAGNOSIS — R531 Weakness: Secondary | ICD-10-CM | POA: Diagnosis not present

## 2020-02-28 ENCOUNTER — Encounter (INDEPENDENT_AMBULATORY_CARE_PROVIDER_SITE_OTHER): Payer: Self-pay | Admitting: Nurse Practitioner

## 2020-02-28 NOTE — Progress Notes (Signed)
Subjective:    Patient ID: Ann Russell, female    DOB: [birth year], 79 y.o.   MRN: [id number] Chief Complaint  Patient presents with  . Follow-up    ultrasound follow up    The patient presents today for follow-up studies related to lower extremity leg pain.  This is been ongoing for some time per the patient.  The patient has profound weakness and has had multiple falls recently, therefore she utilizes a wheelchair majority of the time.  Patient has had lower extremity pain and discoloration.  There is a previous history of DVT approximately 2 and half years ago.  The patient also had a resulting pulmonary embolism.  She has been doing well post pulmonary thrombectomy.  She denies any chest pain or shortness of breath.  However the patient has had significant pain and swelling in her bilateral lower extremities.  The patient also has profound leg weakness to where she has difficulty with even lifting her legs.  The patient is also has frequent falls as well.  She notes that elevation of her lower extremities is helpful.  The patient also notes that she recently began taking Lyrica for her neuropathy and her pain has decreased greatly since that time.  She denies any fever, chills, nausea, vomiting or diarrhea.   Today noninvasive symptoms show no evidence of DVT or superficial thrombophlebitis.  No evidence of superficial venous reflux seen bilaterally.  No evidence of deep venous insufficiency seen in the left lower extremity whereas the right lower extremity has reflux in the common femoral vein.  The patient also underwent bilateral ABIs.  Her right ABI is 1.24 the left ABI is 1.26.  The patient has toe pressures of 119 on the right and 91 on the left.  Patient has strong triphasic tibial artery waveforms bilaterally.       Review of Systems  Cardiovascular: Positive for leg swelling.  Musculoskeletal: Positive for arthralgias.  Neurological: Positive for weakness.  All other systems  reviewed and are negative.      Objective:   Physical Exam Vitals reviewed.  Cardiovascular:     Rate and Rhythm: Normal rate.     Pulses: Normal pulses.  Pulmonary:     Effort: Pulmonary effort is normal.  Musculoskeletal:     Right lower leg: Edema present.     Left lower leg: Edema present.  Skin:    Capillary Refill: Capillary refill takes 2 to 3 seconds.     Comments: Legs are cool with multiple varicosities  Neurological:     Mental Status: She is alert and oriented to person, place, and time.     Motor: Weakness present.     Gait: Gait abnormal.  Psychiatric:        Mood and Affect: Mood normal.        Behavior: Behavior normal.        Thought Content: Thought content normal.        Judgment: Judgment normal.     BP 117/84 (BP Location: Left Arm)   Pulse 65   Resp 16   Past Medical History:  Diagnosis Date  . Arthritis    hands  . 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 Depression   . Hyperlipemia   . Hypertension    h/o had gastric bypass and was able to come off meds  .  Pulmonary embolism ([place])   . Sleep apnea    no cpap since her gastric bypass    Social History   Socioeconomic History  . Marital status: Significant Other    Spouse name: Not on file  . Number of children: Not on file  . Years of education: Not on file  . Highest education level: Not on file  Occupational History  . Occupation: retired  Tobacco Use  . Smoking status: Never Smoker  . Smokeless tobacco: Never Used  Vaping Use  . Vaping Use: Never used  Substance and Sexual Activity  . Alcohol use: No  . Drug use: No  . Sexual activity: Not Currently  Other Topics Concern  . Not on file  Social History Narrative  . Not on file   Social Determinants of Health   Financial Resource Strain:   . Difficulty of Paying Living Expenses: Not on file  Food Insecurity:   . Worried About Paediatric nurse in the Last Year: Not on file  . Ran Out of Food in the Last Year: Not on file  Transportation Needs:   . Lack of Transportation (Medical): Not on file  . Lack of Transportation (Non-Medical): Not on file  Physical Activity:   . Days of Exercise per Week: Not on file  . Minutes of Exercise per Session: Not on file  Stress:   . Feeling of Stress : Not on file  Social Connections:   . Frequency of Communication with Friends and Family: Not on file  . Frequency of Social Gatherings with Friends and Family: Not on file  . Attends Religious Services: Not on file  . Active Member of Clubs or Organizations: Not on file  . Attends Archivist Meetings: Not on file  . Marital Status: Not on file  Intimate Partner Violence:   . Fear of Current or Ex-Partner: Not on file  . Emotionally Abused: Not on file  . Physically Abused: Not on file  . Sexually Abused: Not on file    Past Surgical History:  Procedure Laterality Date  . ABDOMINAL HYSTERECTOMY    . Buniectomy     bil  . CHOLECYSTECTOMY    . FACIAL COSMETIC SURGERY  2011  . GASTRIC BYPASS  2010  . PULMONARY THROMBECTOMY Bilateral 06/22/2017   Procedure: PULMONARY THROMBECTOMY;  Surgeon: Algernon Huxley, MD;  Location: [facility];  Service: Cardiovascular;  Laterality: Bilateral;  . Rotor cuff     right  . SCLERAL BUCKLE  11/07/2011   Procedure: SCLERAL BUCKLE;  Surgeon: Hayden Pedro, MD;  Location: [facility];  Service: Ophthalmology;  Laterality: Left;  . TOTAL KNEE ARTHROPLASTY Right 12/03/2018   Procedure: RIGHT TOTAL KNEE ARTHROPLASTY;  Surgeon: Hessie Knows, MD;  Location: [facility];  Service: Orthopedics;  Laterality: Right;    Family History  Problem Relation Age of Onset  . Heart disease Mother   . Dementia Mother   . Neuropathy Father   . Diabetes Brother   . Breast cancer Neg Hx     No Known Allergies  CBC Latest Ref Rng & Units 09/20/2019 07/24/2019 12/06/2018  WBC 4.0 - 10.5 K/uL 6.2 5.7 7.4   Hemoglobin 12.0 - 15.0 g/dL 9.7(L) 10.3(L) 8.8(L)  Hematocrit 36 - 46 % 29.5(L) 32.3(L) 26.8(L)  Platelets 150 - 400 K/uL 232 276 207      CMP     Component Value Date/Time   NA 135 09/20/2019 1418   K 4.8 09/20/2019  1418   CL 102 09/20/2019 1418   CO2 24 09/20/2019 1418   GLUCOSE 98 09/20/2019 1418   BUN 57 (H) 09/20/2019 1418   CREATININE 1.86 (H) 09/20/2019 1418   CALCIUM 9.5 09/20/2019 1418   PROT 7.0 09/20/2019 1418   ALBUMIN 3.9 09/20/2019 1418   AST 29 09/20/2019 1418   ALT 19 09/20/2019 1418   ALKPHOS 40 09/20/2019 1418   BILITOT 0.6 09/20/2019 1418   GFRNONAA 25 (L) 09/20/2019 1418   GFRAA 30 (L) 09/20/2019 1418          Assessment & Plan:   1. Swelling of limb I have had a long discussion with the patient regarding swelling and why it  causes symptoms.  Patient will begin wearing graduated compression stockings class 1 (20-30 mmHg) on a daily basis a prescription was given. The patient will  beginning wearing the stockings first thing in the morning and removing them in the evening. The patient is instructed specifically not to sleep in the stockings.   In addition, behavioral modification will be initiated.  This will include frequent elevation, use of over the counter pain medications and exercise such as walking.  I have reviewed systemic causes for chronic edema such as liver, kidney and cardiac etiologies.  The patient denies problems with these organ systems.    Consideration for a lymph pump will also be made based upon the effectiveness of conservative therapy.  This would help to improve the edema control and prevent sequela such as ulcers and infections   The patient will follow-up with me after the ultrasound.    2. Pain in both lower extremities Recommend:  I do not find evidence of Vascular pathology that would explain the patient's symptoms  The patient has atypical pain symptoms for vascular disease  I do not find evidence of Vascular  pathology that would explain the patient's symptoms and I suspect the patient is c/o pseudoclaudication.  Patient should have an evaluation of his LS spine which I defer to the primary service.  Noninvasive studies including venous ultrasound of the legs do not identify vascular problems  The patient should continue walking and begin a more formal exercise program. The patient should continue his antiplatelet therapy and aggressive treatment of the lipid abnormalities. The patient should begin wearing graduated compression socks 15-20 mmHg strength to control her mild edema.   Further work-up of her lower extremity pain is deferred to the primary service     3. Weakness Patient's lower extremity weakness is not consistent with patient's vascular studies.  The patient is currently following with neurology and has upcoming testing for suspected neurological issues such as myasthenia gravis.  If no neurological etiology was identified a possible consideration is to have patient evaluated by neurosurgery for possible spinal stenosis  4. Hyperlipidemia, unspecified hyperlipidemia type Continue statin as ordered and reviewed, no changes at this time   5. Primary hypertension Continue antihypertensive medications as already ordered, these medications have been reviewed and there are no changes at this time.    Current Outpatient Medications on File Prior to Visit  Medication Sig Dispense Refill  . acetaminophen (TYLENOL) 650 MG CR tablet Take 650 mg by mouth 2 (two) times a day.    Marland Kitchen apixaban (ELIQUIS) 5 MG TABS tablet Take 1 tablet (5 mg total) by mouth 2 (two) times daily. 60 tablet 0  . B COMPLEX VITAMINS SL Place 1 tablet under the tongue daily.    . bisacodyl (DULCOLAX) 5 MG  EC tablet Take 1 tablet (5 mg total) by mouth daily as needed for moderate constipation. 30 tablet 0  . busPIRone (BUSPAR) 15 MG tablet Take 15 mg by mouth 2 (two) times daily.     . calcitRIOL (ROCALTROL) 0.25 MCG  capsule Take 0.25 mcg by mouth daily.     . Calcium Carb-Cholecalciferol (CALCIUM 600 + D PO) Take 1 tablet by mouth daily.    . diphenhydramine-acetaminophen (TYLENOL PM) 25-500 MG TABS Take 1 tablet by mouth at bedtime.     . ferrous [id number] C-folic acid (TRINSICON / FOLTRIN) capsule Take 1 capsule by mouth 2 (two) times daily. 20 capsule   . FLUoxetine (PROZAC) 20 MG capsule Take 60 mg by mouth every morning.     . Multiple Vitamin (MULTI-VITAMINS) TABS Take 2 tablets by mouth daily.     . pregabalin (LYRICA) 100 MG capsule Take 100 mg by mouth 3 (three) times daily.    . Pyridoxine HCl (VITAMIN B6 PO) Take 1 tablet by mouth daily. With b12    . valACYclovir (VALTREX) 1000 MG tablet Take 1,000 mg by mouth daily as needed (fever blisters).     Marland Kitchen HYDROcodone-acetaminophen (NORCO/VICODIN) 5-325 MG tablet Take 1 tablet by mouth every 6 (six) hours as needed for moderate pain (pain score 4-6). (Patient not taking: Reported on 02/27/2020) 20 tablet 0  . oxyCODONE-acetaminophen (PERCOCET/ROXICET) 5-325 MG tablet Take 1 tablet by mouth every 6 (six) hours as needed for severe pain. (Patient not taking: Reported on 02/27/2020) 20 tablet 0   No current facility-administered medications on file prior to visit.    There are no Patient Instructions on file for this visit. No follow-ups on file.   Kris Hartmann, NP

## 2020-03-24 ENCOUNTER — Other Ambulatory Visit: Payer: Self-pay | Admitting: Internal Medicine

## 2020-03-24 ENCOUNTER — Other Ambulatory Visit (HOSPITAL_COMMUNITY): Payer: Self-pay | Admitting: Internal Medicine

## 2020-03-24 DIAGNOSIS — I10 Essential (primary) hypertension: Secondary | ICD-10-CM

## 2020-03-24 DIAGNOSIS — R29898 Other symptoms and signs involving the musculoskeletal system: Secondary | ICD-10-CM

## 2020-03-31 ENCOUNTER — Ambulatory Visit
Admission: RE | Admit: 2020-03-31 | Discharge: 2020-03-31 | Disposition: A | Discharge status: Home / Self Care | Payer: Medicare PPO | Source: Ambulatory Visit | Attending: Internal Medicine | Admitting: Internal Medicine

## 2020-03-31 ENCOUNTER — Other Ambulatory Visit: Payer: Self-pay

## 2020-03-31 DIAGNOSIS — I10 Essential (primary) hypertension: Secondary | ICD-10-CM | POA: Insufficient documentation

## 2020-03-31 DIAGNOSIS — R29898 Other symptoms and signs involving the musculoskeletal system: Secondary | ICD-10-CM | POA: Diagnosis not present

## 2020-06-30 ENCOUNTER — Other Ambulatory Visit: Payer: Self-pay | Admitting: Neurology

## 2020-06-30 DIAGNOSIS — R1032 Left lower quadrant pain: Secondary | ICD-10-CM

## 2020-07-13 ENCOUNTER — Other Ambulatory Visit: Payer: Medicare PPO

## 2020-08-27 ENCOUNTER — Ambulatory Visit (INDEPENDENT_AMBULATORY_CARE_PROVIDER_SITE_OTHER): Payer: Medicare PPO | Admitting: Nurse Practitioner

## 2020-08-30 ENCOUNTER — Encounter: Payer: Medicare PPO | Admitting: Oncology

## 2020-08-30 ENCOUNTER — Other Ambulatory Visit: Payer: Medicare PPO

## 2020-08-31 ENCOUNTER — Inpatient Hospital Stay: Payer: Medicare PPO | Attending: Oncology | Admitting: Oncology

## 2020-08-31 ENCOUNTER — Inpatient Hospital Stay: Payer: Medicare PPO

## 2020-08-31 ENCOUNTER — Encounter: Payer: Self-pay | Admitting: Oncology

## 2020-08-31 ENCOUNTER — Encounter (INDEPENDENT_AMBULATORY_CARE_PROVIDER_SITE_OTHER): Payer: Self-pay

## 2020-08-31 DIAGNOSIS — I420 Dilated cardiomyopathy: Secondary | ICD-10-CM | POA: Diagnosis not present

## 2020-08-31 DIAGNOSIS — Z9884 Bariatric surgery status: Secondary | ICD-10-CM | POA: Insufficient documentation

## 2020-08-31 DIAGNOSIS — Z86711 Personal history of pulmonary embolism: Secondary | ICD-10-CM | POA: Diagnosis not present

## 2020-08-31 DIAGNOSIS — G4730 Sleep apnea, unspecified: Secondary | ICD-10-CM | POA: Diagnosis not present

## 2020-08-31 DIAGNOSIS — E785 Hyperlipidemia, unspecified: Secondary | ICD-10-CM | POA: Insufficient documentation

## 2020-08-31 DIAGNOSIS — N183 Anemia in chronic kidney disease: Secondary | ICD-10-CM

## 2020-08-31 DIAGNOSIS — N184 Chronic kidney disease, stage 4 (severe): Secondary | ICD-10-CM | POA: Insufficient documentation

## 2020-08-31 DIAGNOSIS — G629 Polyneuropathy, unspecified: Secondary | ICD-10-CM | POA: Insufficient documentation

## 2020-08-31 DIAGNOSIS — D631 Anemia in chronic kidney disease: Secondary | ICD-10-CM | POA: Diagnosis present

## 2020-08-31 DIAGNOSIS — Z9049 Acquired absence of other specified parts of digestive tract: Secondary | ICD-10-CM | POA: Insufficient documentation

## 2020-08-31 DIAGNOSIS — R5383 Other fatigue: Secondary | ICD-10-CM | POA: Insufficient documentation

## 2020-08-31 DIAGNOSIS — F419 Anxiety disorder, unspecified: Secondary | ICD-10-CM | POA: Diagnosis not present

## 2020-08-31 DIAGNOSIS — I129 Hypertensive chronic kidney disease with stage 1 through stage 4 chronic kidney disease, or unspecified chronic kidney disease: Secondary | ICD-10-CM | POA: Diagnosis present

## 2020-08-31 LAB — COMPREHENSIVE METABOLIC PANEL
ALT: 16 U/L (ref 0–44)
AST: 25 U/L (ref 15–41)
Albumin: 3.7 g/dL (ref 3.5–5.0)
Alkaline Phosphatase: 55 U/L (ref 38–126)
Anion gap: 10 (ref 5–15)
BUN: 35 mg/dL — ABNORMAL HIGH (ref 8–23)
CO2: 23 mmol/L (ref 22–32)
Calcium: 9.3 mg/dL (ref 8.9–10.3)
Chloride: 103 mmol/L (ref 98–111)
Creatinine, Ser: 1.74 mg/dL — ABNORMAL HIGH (ref 0.44–1.00)
GFR, Estimated: 29 mL/min — ABNORMAL LOW (ref >60)
Glucose, Bld: 99 mg/dL (ref 70–99)
Potassium: 4.5 mmol/L (ref 3.5–5.1)
Sodium: 136 mmol/L (ref 135–145)
Total Bilirubin: 0.3 mg/dL (ref 0.3–1.2)
Total Protein: 7 g/dL (ref 6.5–8.1)

## 2020-08-31 LAB — IRON AND TIBC
Iron: 23 ug/dL — ABNORMAL LOW (ref 28–170)
Saturation Ratios: 5 % — ABNORMAL LOW (ref 10.4–31.8)
TIBC: 514 ug/dL — ABNORMAL HIGH (ref 250–450)
UIBC: 491 ug/dL

## 2020-08-31 LAB — CBC WITH DIFFERENTIAL/PLATELET
Abs Immature Granulocytes: 0.06 10*3/uL (ref 0.00–0.07)
Basophils Absolute: 0 10*3/uL (ref 0.0–0.1)
Basophils Relative: 1 %
Eosinophils Absolute: 0.1 10*3/uL (ref 0.0–0.5)
Eosinophils Relative: 1 %
HCT: 30 % — ABNORMAL LOW (ref 36.0–46.0)
Hemoglobin: 9.4 g/dL — ABNORMAL LOW (ref 12.0–15.0)
Immature Granulocytes: 1 %
Lymphocytes Relative: 28 %
Lymphs Abs: 1.9 10*3/uL (ref 0.7–4.0)
MCH: 25.3 pg — ABNORMAL LOW (ref 26.0–34.0)
MCHC: 31.3 g/dL (ref 30.0–36.0)
MCV: 80.6 fL (ref 80.0–100.0)
Monocytes Absolute: 0.4 10*3/uL (ref 0.1–1.0)
Monocytes Relative: 6 %
Neutro Abs: 4.3 10*3/uL (ref 1.7–7.7)
Neutrophils Relative %: 63 %
Platelets: 279 10*3/uL (ref 150–400)
RBC: 3.72 MIL/uL — ABNORMAL LOW (ref 3.87–5.11)
RDW: 20.5 % — ABNORMAL HIGH (ref 11.5–15.5)
WBC: 6.7 10*3/uL (ref 4.0–10.5)
nRBC: 0 % (ref 0.0–0.2)

## 2020-08-31 LAB — FOLATE: Folate: 25 ng/mL (ref >5.9)

## 2020-08-31 LAB — RETICULOCYTES
Immature Retic Fract: 17 % — ABNORMAL HIGH (ref 2.3–15.9)
RBC.: 3.71 MIL/uL — ABNORMAL LOW (ref 3.87–5.11)
Retic Count, Absolute: 53.8 10*3/uL (ref 19.0–186.0)
Retic Ct Pct: 1.5 % (ref 0.4–3.1)

## 2020-08-31 LAB — TSH: TSH: 3.033 u[IU]/mL (ref 0.350–4.500)

## 2020-08-31 LAB — VITAMIN B12: Vitamin B-12: 360 pg/mL (ref 180–914)

## 2020-08-31 LAB — FERRITIN: Ferritin: 5 ng/mL — ABNORMAL LOW (ref 11–307)

## 2020-08-31 NOTE — Progress Notes (Signed)
New pt referred by Dr Holley Raring for anemia in chronic kidney disease.  Pt brought to clinic by significant other.

## 2020-09-01 LAB — HAPTOGLOBIN: Haptoglobin: 206 mg/dL (ref 42–346)

## 2020-09-04 ENCOUNTER — Encounter: Payer: Self-pay | Admitting: Oncology

## 2020-09-04 NOTE — Progress Notes (Signed)
Hematology/Oncology Consult note [facility] Telephone:([phone] [phone]  Patient Care Team: Idelle Crouch, MD as PCP - General (Internal Medicine) Lovell Sheehan, MD as Consulting Physician (Orthopedic Surgery)   Name of the patient: Ann Russell  [id number]  09-22-1940    Reason for referral-anemia of chronic kidney disease   Referring physician-Dr. Holley Raring Date of visit: 09/04/20   History of presenting illness-  patient is a 80 year old female with a past medical history of hypertension hyperlipidemia anxiety disorder who has been referred to [place] for anemia.  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  Most recent CBC from 08/12/2020 showed white cell count of 7.4, H&H of 9.2/31.2 with an MCV of 82.8 and platelet count of 281.  Looking back at her prior CBCs patient's hemoglobin was around 11 until July 2020.  CMP shows serum creatinine that fluctuates between 1.2-1.5.  Patient currently reports some ongoing fatigue.  She remains independent of her ADLs  ECOG PS- 1  Pain scale- 0   Review of systems- Review of Systems  Constitutional: Positive for malaise/fatigue. Negative for chills, fever and weight loss.  HENT: Negative for congestion, ear discharge and nosebleeds.   Eyes: Negative for blurred vision.  Respiratory: Negative for cough, hemoptysis, sputum production, shortness of breath and wheezing.   Cardiovascular: Negative for chest pain, palpitations, orthopnea and claudication.  Gastrointestinal: Negative for abdominal pain, blood in stool, constipation, diarrhea, heartburn, melena, nausea and vomiting.  Genitourinary: Negative for dysuria, flank pain, frequency, hematuria and urgency.  Musculoskeletal: Negative for back pain, joint pain and myalgias.  Skin: Negative for rash.  Neurological: Negative for dizziness,  tingling, focal weakness, seizures, weakness and headaches.  Endo/Heme/Allergies: Does not bruise/bleed easily.  Psychiatric/Behavioral: Negative for depression and suicidal ideas. The patient does not have insomnia.     No Known Allergies  Patient Active Problem List   Diagnosis Date Noted  . Neuropathy   . Anxiety 02/10/2020  . Chronic kidney disease 02/10/2020  . Depression 02/10/2020  . Hyperlipidemia 02/10/2020  . 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 Sleep apnea 02/10/2020  . Pain in limb 02/10/2020  . Swelling of limb 02/10/2020  . Hx pulmonary embolism 11/05/2019  . Hypercalcemia 03/05/2019  . 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 Rotator cuff arthropathy, left 08/02/2017  . Shoulder pain, acute 07/27/2017  . Hypertension 07/24/2017  . Pulmonary embolism ([place]) 06/21/2017  . 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 Anemia 12/18/2013  . 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 Arthritis    hands  . 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 Depression   . Hyperlipemia   . Hypertension    h/o had gastric bypass and was able to come off meds  . Neuropathy   . Pulmonary embolism ([place])   . Sleep apnea    no cpap since her gastric bypass     Past Surgical History:  Procedure Laterality Date  . ABDOMINAL HYSTERECTOMY    . Buniectomy     bil  . CHOLECYSTECTOMY    . FACIAL COSMETIC SURGERY  2011  . GASTRIC BYPASS  2010  . PULMONARY THROMBECTOMY Bilateral 06/22/2017   Procedure: PULMONARY THROMBECTOMY;  Surgeon: Algernon Huxley, MD;  Location: [facility];  Service: Cardiovascular;  Laterality: Bilateral;  . Rotor cuff     right  . SCLERAL BUCKLE  11/07/2011   Procedure: SCLERAL BUCKLE;  Surgeon: Hayden Pedro, MD;  Location: [facility];  Service: Ophthalmology;  Laterality: Left;  . TOTAL KNEE  ARTHROPLASTY Right 12/03/2018   Procedure: RIGHT TOTAL KNEE ARTHROPLASTY;  Surgeon: Hessie Knows, MD;  Location: [facility];  Service: Orthopedics;  Laterality: Right;    Social History   Socioeconomic History  . Marital status: Significant Other    Spouse name: Not on file  . Number of children: 1  . Years of education: Not on file  . Highest education level: Not on file  Occupational History  . Occupation: retired  Tobacco Use  . Smoking status: Never Smoker  . Smokeless tobacco: Never Used  Vaping Use  . Vaping Use: Never used  Substance and Sexual Activity  . Alcohol use: No  . Drug use: No  . Sexual activity: Not Currently  Other Topics Concern  . Not on file  Social History Narrative  . Not on file   Social Determinants of Health   Financial Resource Strain: Not on file  Food Insecurity: Not on file  Transportation Needs: Not on file  Physical Activity: Not on file  Stress: Not on file  Social Connections: Not on file  Intimate Partner Violence: Not on file     Family History  Problem Relation Age of Onset  . Heart disease Mother   . Dementia Mother   . Neuropathy Father   . Diabetes Brother   . Breast cancer Neg Hx      Current Outpatient Medications:  .  acetaminophen (TYLENOL) 650 MG CR tablet, Take 650 mg by mouth 2 (two) times a day., Disp: , Rfl:  .  apixaban (ELIQUIS) 5 MG TABS tablet, Take 1 tablet (5 mg total) by mouth 2 (two) times daily., Disp: 60 tablet, Rfl: 0 .  B COMPLEX VITAMINS SL, Place 1 tablet under the tongue daily., Disp: , Rfl:  .  bisacodyl (DULCOLAX) 5 MG EC tablet, Take 1 tablet (5 mg total) by mouth daily as needed for moderate constipation., Disp: 30 tablet, Rfl: 0 .  busPIRone (BUSPAR) 15 MG tablet, Take 15 mg by mouth 2 (two) times daily. , Disp: , Rfl:  .  calcitRIOL (ROCALTROL) 0.25 MCG capsule, Take 0.25 mcg by mouth daily. , Disp: , Rfl:  .  Calcium Carb-Cholecalciferol (CALCIUM 600 + D PO), Take 1 tablet by mouth daily.,  Disp: , Rfl:  .  diphenhydramine-acetaminophen (TYLENOL PM) 25-500 MG TABS, Take 1 tablet by mouth at bedtime. , Disp: , Rfl:  .  ferrous [id number] C-folic acid (TRINSICON / FOLTRIN) capsule, Take 1 capsule by mouth 2 (two) times daily., Disp: 20 capsule, Rfl:  .  FLUoxetine (PROZAC) 20 MG capsule, Take 60 mg by mouth every morning. , Disp: , Rfl:  .  Multiple Vitamin (MULTI-VITAMINS) TABS, Take 2 tablets by mouth daily. , Disp: , Rfl:  .  pregabalin (LYRICA) 100 MG capsule, Take 100 mg by mouth 3 (three) times daily., Disp: , Rfl:  .  Pyridoxine HCl (VITAMIN B6 PO), Take 1 tablet by mouth daily. With b12, Disp: , Rfl:  .  valACYclovir (VALTREX) 1000 MG tablet, Take 1,000 mg by mouth daily as needed (fever blisters).  (Patient not taking: Reported on 08/31/2020), Disp: , Rfl:    Physical exam:  Vitals:  08/31/20 1350  BP: 115/71  Pulse: 70  Resp: 16  Temp: 98.2 F (36.8 C)  TempSrc: Tympanic  SpO2: 99%  Weight: 175 lb (79.4 kg)  Height: 5\' 8"  (1.727 m)   Physical Exam Constitutional:      General: She is not in acute distress. Cardiovascular:     Rate and Rhythm: Normal rate and regular rhythm.     Heart sounds: Normal heart sounds.  Pulmonary:     Effort: Pulmonary effort is normal.     Breath sounds: Normal breath sounds.  Abdominal:     General: Bowel sounds are normal.     Palpations: Abdomen is soft.  Skin:    General: Skin is warm and dry.  Neurological:     Mental Status: She is alert and oriented to person, place, and time.        CMP Latest Ref Rng & Units 08/31/2020  Glucose 70 - 99 mg/dL 99  BUN 8 - 23 mg/dL 35(H)  Creatinine 0.44 - 1.00 mg/dL 1.74(H)  Sodium 135 - 145 mmol/L 136  Potassium 3.5 - 5.1 mmol/L 4.5  Chloride 98 - 111 mmol/L 103  CO2 22 - 32 mmol/L 23  Calcium 8.9 - 10.3 mg/dL 9.3  Total Protein 6.5 - 8.1 g/dL 7.0  Total Bilirubin 0.3 - 1.2 mg/dL 0.3  Alkaline Phos 38 - 126 U/L 55  AST 15 - 41 U/L 25  ALT 0 - 44 U/L 16   CBC  Latest Ref Rng & Units 08/31/2020  WBC 4.0 - 10.5 K/uL 6.7  Hemoglobin 12.0 - 15.0 g/dL 9.4(L)  Hematocrit 36.0 - 46.0 % 30.0(L)  Platelets 150 - 400 K/uL 279    Assessment and plan- Patient is a 80 y.o. female referred for anemia  Anemia possibly secondary to chronic kidney disease but I will do a complete anemia work-up including a CBC with differential, CMP, ferritin and iron studies, B12 folate reticulocyte myeloid panel haptoglobin TSH.  I will see her back in 2 weeks to discuss the results of blood work and further management   Thank you for this kind referral and the opportunity to participate in the care of this patient   Visit Diagnosis 1. Anemia of chronic kidney failure, stage 3 (moderate) (HCC)     Dr. Randa Evens, MD, MPH [facility] at [facility] [phone] 09/04/2020

## 2020-09-06 LAB — MULTIPLE MYELOMA PANEL, SERUM
Albumin SerPl Elph-Mcnc: 3.6 g/dL (ref 2.9–4.4)
Albumin/Glob SerPl: 1.4 (ref 0.7–1.7)
Alpha 1: 0.2 g/dL (ref 0.0–0.4)
Alpha2 Glob SerPl Elph-Mcnc: 0.8 g/dL (ref 0.4–1.0)
B-Globulin SerPl Elph-Mcnc: 1 g/dL (ref 0.7–1.3)
Gamma Glob SerPl Elph-Mcnc: 0.6 g/dL (ref 0.4–1.8)
Globulin, Total: 2.7 g/dL (ref 2.2–3.9)
IgA: 168 mg/dL (ref 64–422)
IgG (Immunoglobin G), Serum: 856 mg/dL (ref 586–1602)
IgM (Immunoglobulin M), Srm: 36 mg/dL (ref 26–217)
Total Protein ELP: 6.3 g/dL (ref 6.0–8.5)

## 2020-09-15 ENCOUNTER — Inpatient Hospital Stay (HOSPITAL_BASED_OUTPATIENT_CLINIC_OR_DEPARTMENT_OTHER): Payer: Medicare PPO | Admitting: Oncology

## 2020-09-15 ENCOUNTER — Other Ambulatory Visit: Payer: Self-pay

## 2020-09-15 VITALS — Wt 170.0 lb

## 2020-09-15 DIAGNOSIS — D631 Anemia in chronic kidney disease: Secondary | ICD-10-CM | POA: Diagnosis not present

## 2020-09-15 DIAGNOSIS — N183 Chronic kidney disease, stage 3 unspecified: Secondary | ICD-10-CM

## 2020-09-15 DIAGNOSIS — D509 Iron deficiency anemia, unspecified: Secondary | ICD-10-CM | POA: Diagnosis not present

## 2020-09-18 NOTE — Progress Notes (Signed)
I connected with Ann Russell on 09/18/20 at  2:45 PM EDT by video enabled telemedicine visit and verified that I am speaking with the correct person using two identifiers.   I discussed the limitations, risks, security and privacy concerns of performing an evaluation and management service by telemedicine and the availability of in-person appointments. I also discussed with the patient that there may be a patient responsible charge related to this service. The patient expressed understanding and agreed to proceed.  Other persons participating in the visit and their role in the encounter:  none  Patient's location:  home Provider's location:  work  Risk analyst Complaint: Discuss results of blood work  History of present illness: patient is a 80 year old female with a past medical history of hypertension hyperlipidemia anxiety disorder who has been referred to [place] for anemia.  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  Most recent CBC from 08/12/2020 showed white cell count of 7.4, H&H of 9.2/31.2 with an MCV of 82.8 and platelet count of 281.  Looking back at her prior CBCs patient's hemoglobin was around 11 until July 2020.  CMP shows serum creatinine that fluctuates between 1.2-1.5.  Results of blood work from 08/31/2020 were as follows: CBC showed white count of 6.7, H&H of 9.4/30 with an MCV of 80.6 and a platelet count of 279.  CMP showed serum creatinine of 1.7.  B12 and folate were within normal limits.  Ferritin level was low at 5 with an elevated TIBC of 514 and iron saturation of 5%.  Myeloma panel showed no M protein haptoglobin and TSH were normal.  Interval history patient currently reports doing well and denies any significant fatigue.  She feels well overall   Review of Systems  Constitutional: Negative for chills, fever, malaise/fatigue and weight loss.  HENT: Negative for  congestion, ear discharge and nosebleeds.   Eyes: Negative for blurred vision.  Respiratory: Negative for cough, hemoptysis, sputum production, shortness of breath and wheezing.   Cardiovascular: Negative for chest pain, palpitations, orthopnea and claudication.  Gastrointestinal: Negative for abdominal pain, blood in stool, constipation, diarrhea, heartburn, melena, nausea and vomiting.  Genitourinary: Negative for dysuria, flank pain, frequency, hematuria and urgency.  Musculoskeletal: Negative for back pain, joint pain and myalgias.  Skin: Negative for rash.  Neurological: Negative for dizziness, tingling, focal weakness, seizures, weakness and headaches.  Endo/Heme/Allergies: Does not bruise/bleed easily.  Psychiatric/Behavioral: Negative for depression and suicidal ideas. The patient does not have insomnia.     No Known Allergies  Past Medical History:  Diagnosis Date  . Arthritis    hands  . 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 Depression   . Hyperlipemia   . Hypertension    h/o had gastric bypass and was able to come off meds  . Neuropathy   . Pulmonary embolism ([place])   . Sleep apnea    no cpap since her gastric bypass    Past Surgical History:  Procedure Laterality Date  . ABDOMINAL HYSTERECTOMY    . Buniectomy     bil  . CHOLECYSTECTOMY    . FACIAL COSMETIC SURGERY  2011  . GASTRIC BYPASS  2010  . PULMONARY THROMBECTOMY Bilateral 06/22/2017   Procedure: PULMONARY THROMBECTOMY;  Surgeon: Algernon Huxley, MD;  Location: [facility]  INVASIVE CV LAB;  Service: Cardiovascular;  Laterality: Bilateral;  . Rotor cuff     right  . SCLERAL BUCKLE  11/07/2011   Procedure: SCLERAL BUCKLE;  Surgeon: Hayden Pedro, MD;  Location: [facility];  Service: Ophthalmology;  Laterality: Left;  . TOTAL KNEE ARTHROPLASTY Right 12/03/2018   Procedure: RIGHT TOTAL KNEE ARTHROPLASTY;  Surgeon: Hessie Knows, MD;   Location: [facility];  Service: Orthopedics;  Laterality: Right;    Social History   Socioeconomic History  . Marital status: Significant Other    Spouse name: Not on file  . Number of children: 1  . Years of education: Not on file  . Highest education level: Not on file  Occupational History  . Occupation: retired  Tobacco Use  . Smoking status: Never Smoker  . Smokeless tobacco: Never Used  Vaping Use  . Vaping Use: Never used  Substance and Sexual Activity  . Alcohol use: No  . Drug use: No  . Sexual activity: Not Currently  Other Topics Concern  . Not on file  Social History Narrative  . Not on file   Social Determinants of Health   Financial Resource Strain: Not on file  Food Insecurity: Not on file  Transportation Needs: Not on file  Physical Activity: Not on file  Stress: Not on file  Social Connections: Not on file  Intimate Partner Violence: Not on file    Family History  Problem Relation Age of Onset  . Heart disease Mother   . Dementia Mother   . Neuropathy Father   . Diabetes Brother   . Breast cancer Neg Hx      Current Outpatient Medications:  .  acetaminophen (TYLENOL) 650 MG CR tablet, Take 650 mg by mouth 2 (two) times a day., Disp: , Rfl:  .  apixaban (ELIQUIS) 5 MG TABS tablet, Take 1 tablet (5 mg total) by mouth 2 (two) times daily., Disp: 60 tablet, Rfl: 0 .  B COMPLEX VITAMINS SL, Place 1 tablet under the tongue daily., Disp: , Rfl:  .  bisacodyl (DULCOLAX) 5 MG EC tablet, Take 1 tablet (5 mg total) by mouth daily as needed for moderate constipation., Disp: 30 tablet, Rfl: 0 .  busPIRone (BUSPAR) 15 MG tablet, Take 15 mg by mouth 2 (two) times daily. , Disp: , Rfl:  .  calcitRIOL (ROCALTROL) 0.25 MCG capsule, Take 0.25 mcg by mouth daily. , Disp: , Rfl:  .  Calcium Carb-Cholecalciferol (CALCIUM 600 + D PO), Take 1 tablet by mouth daily., Disp: , Rfl:  .  diphenhydramine-acetaminophen (TYLENOL PM) 25-500 MG TABS, Take 1 tablet by mouth at  bedtime. , Disp: , Rfl:  .  ferrous [id number] C-folic acid (TRINSICON / FOLTRIN) capsule, Take 1 capsule by mouth 2 (two) times daily., Disp: 20 capsule, Rfl:  .  FLUoxetine (PROZAC) 20 MG capsule, Take 60 mg by mouth every morning. , Disp: , Rfl:  .  Multiple Vitamin (MULTI-VITAMINS) TABS, Take 2 tablets by mouth daily. , Disp: , Rfl:  .  pregabalin (LYRICA) 100 MG capsule, Take 100 mg by mouth 3 (three) times daily., Disp: , Rfl:  .  Pyridoxine HCl (VITAMIN B6 PO), Take 1 tablet by mouth daily. With b12, Disp: , Rfl:  .  valACYclovir (VALTREX) 1000 MG tablet, Take 1,000 mg by mouth daily as needed (fever blisters).  (Patient not taking: No sig reported), Disp: , Rfl:   No results found.  No images are attached to the encounter.   CMP Latest  Ref Rng & Units 08/31/2020  Glucose 70 - 99 mg/dL 99  BUN 8 - 23 mg/dL 35(H)  Creatinine 0.44 - 1.00 mg/dL 1.74(H)  Sodium 135 - 145 mmol/L 136  Potassium 3.5 - 5.1 mmol/L 4.5  Chloride 98 - 111 mmol/L 103  CO2 22 - 32 mmol/L 23  Calcium 8.9 - 10.3 mg/dL 9.3  Total Protein 6.5 - 8.1 g/dL 7.0  Total Bilirubin 0.3 - 1.2 mg/dL 0.3  Alkaline Phos 38 - 126 U/L 55  AST 15 - 41 U/L 25  ALT 0 - 44 U/L 16   CBC Latest Ref Rng & Units 08/31/2020  WBC 4.0 - 10.5 K/uL 6.7  Hemoglobin 12.0 - 15.0 g/dL 9.4(L)  Hematocrit 36.0 - 46.0 % 30.0(L)  Platelets 150 - 400 K/uL 279     Observation/objective: Appears in no acute distress over video visit today.  Breathing is nonlabored  Assessment and plan: Patient is a 80 year old female referred for anemia of chronic kidney disease also found to have iron deficiency.  She is here to discuss results of blood work done recently  Discussed the results of blood work with the patient which shows that her hemoglobin is moderately low at 9.4 likely a combination of iron deficiency and anemia of chronic kidney disease.  Her iron studies are clearly reflective of iron deficiency.  We discussed oral iron versus  IV iron.  Patient prefers to try oral iron for now and she will try to take that once or twice a day.  She is taking that in the past and has tolerated it well without any significant side effects.   Follow-up instructions:I will repeat CBC ferritin and iron studies in about 10 weeks followed by a video visit 1 to 2 days later  I discussed the assessment and treatment plan with the patient. The patient was provided an opportunity to ask questions and all were answered. The patient agreed with the plan and demonstrated an understanding of the instructions.   The patient was advised to call back or seek an in-person evaluation if the symptoms worsen or if the condition fails to improve as anticipated.  Visit Diagnosis: 1. Anemia of chronic kidney failure, stage 3 (moderate) ([place])   2. Iron deficiency anemia, unspecified iron deficiency anemia type     Dr. Randa Evens, MD, MPH [place] [facility] Tel- [phone] 09/18/2020 1:17 PM

## 2020-11-23 ENCOUNTER — Inpatient Hospital Stay: Payer: Medicare PPO | Attending: Oncology

## 2020-11-24 ENCOUNTER — Inpatient Hospital Stay: Payer: Medicare PPO | Admitting: Oncology

## 2020-11-24 ENCOUNTER — Other Ambulatory Visit: Payer: Self-pay

## 2020-11-24 ENCOUNTER — Encounter: Payer: Self-pay | Admitting: Oncology

## 2020-11-25 ENCOUNTER — Telehealth: Payer: Self-pay | Admitting: Oncology

## 2020-11-25 NOTE — Telephone Encounter (Signed)
Left VM with patient to reschedule her missed appt on 11/24/20. Left direct ext. for her to return call.

## 2021-02-03 ENCOUNTER — Ambulatory Visit: Payer: Medicare PPO | Admitting: Internal Medicine

## 2021-02-03 ENCOUNTER — Encounter: Payer: Self-pay | Admitting: Internal Medicine

## 2021-02-03 ENCOUNTER — Other Ambulatory Visit: Payer: Self-pay

## 2021-02-03 VITALS — BP 124/63 | HR 72 | Temp 97.9°F | Resp 18 | Ht 68.0 in | Wt 173.0 lb

## 2021-02-03 DIAGNOSIS — G4733 Obstructive sleep apnea (adult) (pediatric): Secondary | ICD-10-CM

## 2021-02-03 DIAGNOSIS — N1832 Chronic kidney disease, stage 3b: Secondary | ICD-10-CM

## 2021-02-03 DIAGNOSIS — Z23 Encounter for immunization: Secondary | ICD-10-CM

## 2021-02-03 DIAGNOSIS — I208 Other forms of angina pectoris: Secondary | ICD-10-CM

## 2021-02-03 DIAGNOSIS — M810 Age-related osteoporosis without current pathological fracture: Secondary | ICD-10-CM

## 2021-02-03 DIAGNOSIS — I700 Atherosclerosis of aorta: Secondary | ICD-10-CM

## 2021-02-03 DIAGNOSIS — M159 Polyosteoarthritis, unspecified: Secondary | ICD-10-CM

## 2021-02-03 DIAGNOSIS — G629 Polyneuropathy, unspecified: Secondary | ICD-10-CM

## 2021-02-03 DIAGNOSIS — Z6826 Body mass index (BMI) 26.0-26.9, adult: Secondary | ICD-10-CM

## 2021-02-03 DIAGNOSIS — E663 Overweight: Secondary | ICD-10-CM | POA: Insufficient documentation

## 2021-02-03 DIAGNOSIS — F32A Depression, unspecified: Secondary | ICD-10-CM

## 2021-02-03 DIAGNOSIS — D631 Anemia in chronic kidney disease: Secondary | ICD-10-CM

## 2021-02-03 DIAGNOSIS — M1990 Unspecified osteoarthritis, unspecified site: Secondary | ICD-10-CM | POA: Insufficient documentation

## 2021-02-03 DIAGNOSIS — F419 Anxiety disorder, unspecified: Secondary | ICD-10-CM

## 2021-02-03 NOTE — Assessment & Plan Note (Signed)
Encourage diet that would promote weight loss

## 2021-02-03 NOTE — Assessment & Plan Note (Signed)
Continue Prolia, Calcium and Vitamin D

## 2021-02-03 NOTE — Patient Instructions (Signed)
Heart-Healthy Eating Plan Heart-healthy meal planning includes: Eating less unhealthy fats. Eating more healthy fats. Making other changes in your diet. Talk with your doctor or a diet specialist (dietitian) to create an eating plan that is right for you. What is my plan? Your doctor may recommend an eating plan that includes: Total fat: ______% or less of total calories a day. Saturated fat: ______% or less of total calories a day. Cholesterol: less than _________mg a day. What are tips for following this plan? Cooking Avoid frying your food. Try to bake, boil, grill, or broil it instead. You can also reduce fat by: Removing the skin from poultry. Removing all visible fats from meats. Steaming vegetables in water or broth. Meal planning  At meals, divide your plate into four equal parts: Fill one-half of your plate with vegetables and green salads. Fill one-fourth of your plate with whole grains. Fill one-fourth of your plate with lean protein foods. Eat 4-5 servings of vegetables per day. A serving of vegetables is: 1 cup of raw or cooked vegetables. 2 cups of raw leafy greens. Eat 4-5 servings of fruit per day. A serving of fruit is: 1 medium whole fruit.  cup of dried fruit.  cup of fresh, frozen, or canned fruit.  cup of 100% fruit juice. Eat more foods that have soluble fiber. These are apples, broccoli, carrots, beans, peas, and barley. Try to get 20-30 g of fiber per day. Eat 4-5 servings of nuts, legumes, and seeds per week: 1 serving of dried beans or legumes equals  cup after being cooked. 1 serving of nuts is  cup. 1 serving of seeds equals 1 tablespoon. General information Eat more home-cooked food. Eat less restaurant, buffet, and fast food. Limit or avoid alcohol. Limit foods that are high in starch and sugar. Avoid fried foods. Lose weight if you are overweight. Keep track of how much salt (sodium) you eat. This is important if you have high blood  pressure. Ask your doctor to tell you more about this. Try to add vegetarian meals each week. Fats Choose healthy fats. These include olive oil and canola oil, flaxseeds, walnuts, almonds, and seeds. Eat more omega-3 fats. These include salmon, mackerel, sardines, tuna, flaxseed oil, and ground flaxseeds. Try to eat fish at least 2 times each week. Check food labels. Avoid foods with trans fats or high amounts of saturated fat. Limit saturated fats. These are often found in animal products, such as meats, butter, and cream. These are also found in plant foods, such as palm oil, palm kernel oil, and coconut oil. Avoid foods with partially hydrogenated oils in them. These have trans fats. Examples are stick margarine, some tub margarines, cookies, crackers, and other baked goods. What foods can I eat? Fruits All fresh, canned (in natural juice), or frozen fruits. Vegetables Fresh or frozen vegetables (raw, steamed, roasted, or grilled). Green salads. Grains Most grains. Choose whole wheat and whole grains most of the time. Rice and pasta, including brown rice and pastas made with whole wheat. Meats and other proteins Lean, well-trimmed beef, veal, pork, and lamb. Chicken and turkey without skin. All fish and shellfish. Wild duck, rabbit, pheasant, and venison. Egg whites or low-cholesterol egg substitutes. Dried beans, peas, lentils, and tofu. Seeds and most nuts. Dairy Low-fat or nonfat cheeses, including ricotta and mozzarella. Skim or 1% milk that is liquid, powdered, or evaporated. Buttermilk that is made with low-fat milk. Nonfat or low-fat yogurt. Fats and oils Non-hydrogenated (trans-free) margarines. Vegetable oils, including   soybean, sesame, sunflower, olive, peanut, safflower, corn, canola, and cottonseed. Salad dressings or mayonnaise made with a vegetable oil. Beverages Mineral water. Coffee and tea. Diet carbonated beverages. Sweets and desserts Sherbet, gelatin, and fruit ice.  Small amounts of dark chocolate. Limit all sweets and desserts. Seasonings and condiments All seasonings and condiments. The items listed above may not be a complete list of foods and drinks you can eat. Contact a dietitian for more options. What foods should I avoid? Fruits Canned fruit in heavy syrup. Fruit in cream or butter sauce. Fried fruit. Limit coconut. Vegetables Vegetables cooked in cheese, cream, or butter sauce. Fried vegetables. Grains Breads that are made with saturated or trans fats, oils, or whole milk. Croissants. Sweet rolls. Donuts. High-fat crackers, such as cheese crackers. Meats and other proteins Fatty meats, such as hot dogs, ribs, sausage, bacon, rib-eye roast or steak. High-fat deli meats, such as salami and bologna. Caviar. Domestic duck and goose. Organ meats, such as liver. Dairy Cream, sour cream, cream cheese, and creamed cottage cheese. Whole-milk cheeses. Whole or 2% milk that is liquid, evaporated, or condensed. Whole buttermilk. Cream sauce or high-fat cheese sauce. Yogurt that is made from whole milk. Fats and oils Meat fat, or shortening. Cocoa butter, hydrogenated oils, palm oil, coconut oil, palm kernel oil. Solid fats and shortenings, including bacon fat, salt pork, lard, and butter. Nondairy cream substitutes. Salad dressings with cheese or sour cream. Beverages Regular sodas and juice drinks with added sugar. Sweets and desserts Frosting. Pudding. Cookies. Cakes. Pies. Milk chocolate or white chocolate. Buttered syrups. Full-fat ice cream or ice cream drinks. The items listed above may not be a complete list of foods and drinks to avoid. Contact a dietitian for more information. Summary Heart-healthy meal planning includes eating less unhealthy fats, eating more healthy fats, and making other changes in your diet. Eat a balanced diet. This includes fruits and vegetables, low-fat or nonfat dairy, lean protein, nuts and legumes, whole grains, and  heart-healthy oils and fats. This information is not intended to replace advice given to you by your health care provider. Make sure you discuss any questions you have with your health care provider. Document Revised: 08/19/2020 Document Reviewed: 08/19/2020 Elsevier Patient Education  2022 Elsevier Inc.  

## 2021-02-03 NOTE — Progress Notes (Signed)
HPI  Patient presents the clinic today to establish care and for management of the conditions listed below.  OA: Mainly in her knees and feet.  She takes Tylenol and Pregabalin as needed we will good relief of symptoms.  CKD: Her her last creatinine was 1.4, GFR 36, 10/2020.  She is not currently on an ACEI/ARB.  She follows with nephrology.  HTN: Her BP today is 124/63.  She is not taking any oral antihypertensive at this time.  ECG from 09/2019 reviewed.  HLD with CAD with Angina/Aortic Atherosclerosis: Her last LDL was 66, triglycerides 87, 06/2020.  She is not taking any cholesterol-lowering medication at this time.  She is taking Eliquis given her history of pulmonary embolism.  She tries to consume a low-fat diet.  Anxiety and Depression: Chronic, managed on Fluoxetine and BuSpar.  She is not currently seeing a therapist.  She denies SI/HI.  Neuropathy: Hereditary.  She is taking Pregabalin as prescribed.  She follows with neurology.  OSA: She averages 8 hours of sleep per night without use of her CPAP.  There is no sleep study on file.  Anemia: Her last H/H was 11/33.8, 10/2020.  She is taking Iron as prescribed.  She does not follow with hematology.  Hypercalcemia: Her last calcium was 9.2, 10/2020.  She is taking Calcitrol as prescribed.  She follows with endocrinology.  Osteoporosis: She is taking Prolia injections as well as Calcium and Vit D.  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  Takes Vasotec for kidneys.-stage 3   Depression    Hyperlipemia    Hypertension    h/o had gastric bypass and was able to come off meds   Neuropathy    Pulmonary embolism (HCC)    Sleep apnea    no cpap since her gastric bypass    Current Outpatient Medications  Medication Sig Dispense Refill   acetaminophen (TYLENOL) 650 MG CR tablet Take 650 mg by mouth 2 (two) times a day.      apixaban (ELIQUIS) 5 MG TABS tablet Take 1 tablet (5 mg total) by mouth 2 (two) times daily. 60 tablet 0   B COMPLEX VITAMINS SL Place 1 tablet under the tongue daily.     bisacodyl (DULCOLAX) 5 MG EC tablet Take 1 tablet (5 mg total) by mouth daily as needed for moderate constipation. (Patient not taking: Reported on 11/24/2020) 30 tablet 0   busPIRone (BUSPAR) 15 MG tablet Take 15 mg by mouth 2 (two) times daily.      calcitRIOL (ROCALTROL) 0.25 MCG capsule Take 0.25 mcg by mouth daily.      Calcium Carb-Cholecalciferol (CALCIUM 600 + D PO) Take 1 tablet by mouth daily.     diphenhydramine-acetaminophen (TYLENOL PM) 25-500 MG TABS Take 1 tablet by mouth at bedtime.      ferrous [id number] C-folic acid (TRINSICON / FOLTRIN) capsule Take 1 capsule by mouth 2 (two) times daily. 20 capsule    FLUoxetine (PROZAC) 20 MG capsule Take 60 mg by mouth every morning.      FLUoxetine (PROZAC) 20 MG capsule Take by mouth. (Patient not taking: Reported on 11/24/2020)     Multiple Vitamin (MULTI-VITAMINS) TABS Take 2 tablets by mouth daily.      pregabalin (LYRICA) 100 MG capsule Take 100 mg by mouth 3 (three) times daily.  Pyridoxine HCl (VITAMIN B6 PO) Take 1 tablet by mouth daily. With b12     valACYclovir (VALTREX) 1000 MG tablet Take 1,000 mg by mouth daily as needed (fever blisters).  (Patient not taking: No sig reported)     No current facility-administered medications for this visit.    No Known Allergies  Family History  Problem Relation Age of Onset   Heart disease Mother    Dementia Mother    Neuropathy Father    Diabetes Brother    Breast cancer Neg Hx     Social History   Socioeconomic History   Marital status: Significant Other    Spouse name: Not on file   Number of children: 1   Years of education: Not on file   Highest education level: Not on file  Occupational History   Occupation: retired  Tobacco Use   Smoking status: Never   Smokeless tobacco: Never  Vaping  Use   Vaping Use: Never used  Substance and Sexual Activity   Alcohol use: No   Drug use: No   Sexual activity: Not Currently  Other Topics Concern   Not on file  Social History Narrative   Not on file   Social Determinants of Health   Financial Resource Strain: Not on file  Food Insecurity: Not on file  Transportation Needs: Not on file  Physical Activity: Not on file  Stress: Not on file  Social Connections: Not on file  Intimate Partner Violence: Not on file    ROS:  Constitutional: Denies fever, malaise, fatigue, headache or abrupt weight changes.  HEENT: Denies eye pain, eye redness, ear pain, ringing in the ears, wax buildup, runny nose, nasal congestion, bloody nose, or sore throat. Respiratory: Denies difficulty breathing, shortness of breath, cough or sputum production.   Cardiovascular: Denies chest pain, chest tightness, palpitations or swelling in the hands or feet.  Gastrointestinal: Denies abdominal pain, bloating, constipation, diarrhea or blood in the stool.  GU: Denies frequency, urgency, pain with urination, blood in urine, odor or discharge. Musculoskeletal: Patient reports joint pain.  Denies decrease in range of motion, difficulty with gait, muscle pain or joint swelling.  Skin: Denies redness, rashes, lesions or ulcercations.  Neurological: Denies dizziness, difficulty with memory, difficulty with speech or problems with balance and coordination.  Psych: Patient has a history of anxiety and depression.  Denies  SI/HI.  No other specific complaints in a complete review of systems (except as listed in HPI above).  PE: BP 124/63 (BP Location: Right Arm, Patient Position: Sitting, Cuff Size: Normal)   Pulse 72   Temp 97.9 F (36.6 C) (Temporal)   Resp 18   Ht 5\' 8"  (1.727 m)   Wt 173 lb (78.5 kg)   SpO2 99%   BMI 26.30 kg/m   Wt Readings from Last 3 Encounters:  09/15/20 170 lb (77.1 kg)  08/31/20 175 lb (79.4 kg)  09/20/19 170 lb (77.1 kg)     General: Appears her stated age, well developed, well nourished in NAD. HEENT: Head: normal shape and size; Neck: Neck supple, trachea midline. No masses, lumps or thyromegaly present.  Cardiovascular: Normal rate and rhythm. S1,S2 noted.  No murmur, rubs or gallops noted. No JVD or BLE edema. No carotid bruits noted. Pulmonary/Chest: Normal effort and positive vesicular breath sounds. No respiratory distress. No wheezes, rales or ronchi noted.  Musculoskeletal: In a wheelchair. Neurological: Alert and oriented.  Psychiatric: Mood and affect normal. Behavior is normal. Judgment and thought content normal.  BMET    Component Value Date/Time   NA 136 08/31/2020 1412   K 4.5 08/31/2020 1412   CL 103 08/31/2020 1412   CO2 23 08/31/2020 1412   GLUCOSE 99 08/31/2020 1412   BUN 35 (H) 08/31/2020 1412   CREATININE 1.74 (H) 08/31/2020 1412   CALCIUM 9.3 08/31/2020 1412   GFRNONAA 29 (L) 08/31/2020 1412   GFRAA 30 (L) 09/20/2019 1418    Lipid Panel  No results found for: CHOL, TRIG, HDL, CHOLHDL, VLDL, LDLCALC  CBC    Component Value Date/Time   WBC 6.7 08/31/2020 1412   RBC 3.71 (L) 08/31/2020 1412   RBC 3.72 (L) 08/31/2020 1412   HGB 9.4 (L) 08/31/2020 1412   HCT 30.0 (L) 08/31/2020 1412   PLT 279 08/31/2020 1412   MCV 80.6 08/31/2020 1412   MCH 25.3 (L) 08/31/2020 1412   MCHC 31.3 08/31/2020 1412   RDW 20.5 (H) 08/31/2020 1412   LYMPHSABS 1.9 08/31/2020 1412   MONOABS 0.4 08/31/2020 1412   EOSABS 0.1 08/31/2020 1412   BASOSABS 0.0 08/31/2020 1412    Hgb A1C No results found for: HGBA1C   Assessment and Plan:   Webb Silversmith, NP This visit occurred during the SARS-CoV-2 public health emergency.  Safety protocols were in place, including screening questions prior to the visit, additional usage of staff PPE, and extensive cleaning of exam room while observing appropriate contact time as indicated for disinfecting solutions.

## 2021-02-03 NOTE — Assessment & Plan Note (Signed)
Pain controlled with Tylenol and Pregabalin Will monitor

## 2021-02-03 NOTE — Assessment & Plan Note (Signed)
Stable on Fluoxetine and BuSpar Support offered

## 2021-02-03 NOTE — Assessment & Plan Note (Signed)
She is not currently wearing CPAP

## 2021-02-03 NOTE — Assessment & Plan Note (Signed)
Kidney function reviewed No need for her to continue to follow with nephrology

## 2021-02-03 NOTE — Assessment & Plan Note (Signed)
We will check lipid profile with annual exam Continue Eliquis

## 2021-02-03 NOTE — Assessment & Plan Note (Signed)
No recent angina On Eliquis

## 2021-02-03 NOTE — Assessment & Plan Note (Signed)
Pain controlled with Pregabalin

## 2021-02-03 NOTE — Assessment & Plan Note (Signed)
Continue Calcitriol 

## 2021-02-03 NOTE — Assessment & Plan Note (Signed)
Controlled off meds  Will monitor 

## 2021-02-03 NOTE — Assessment & Plan Note (Signed)
CBC reviewed Continue oral iron 

## 2021-04-21 ENCOUNTER — Other Ambulatory Visit: Payer: Self-pay | Admitting: Internal Medicine

## 2021-04-21 NOTE — Telephone Encounter (Signed)
Medication Refill - Medication: apixaban (ELIQUIS) 5 MG TABS tablet  Has the patient contacted their pharmacy? No. Pt stated PCP Ann Russell, told her to call us when she needed refill.   (Agent: If no, request that the patient contact the pharmacy for the refill. If patient does not wish to contact the pharmacy document the reason why and proceed with request.)   Preferred Pharmacy (with phone number or street name):   [facility] [place], [place] - [address]  [place] [address]  Phone: [phone] Fax: [phone]  Hours: Not open 24 hours   Has the patient been seen for an appointment in the last year OR does the patient have an upcoming appointment? Yes.    Agent: Please be advised that RX refills may take up to 3 business days. We ask that you follow-up with your pharmacy.

## 2021-04-21 NOTE — Telephone Encounter (Signed)
Requested medication (s) are on the active medication list: yes  Future visit scheduled: no  Notes to clinic:  prescriber is not in this practice, please assess.   Requested Prescriptions  Pending Prescriptions Disp Refills   apixaban (ELIQUIS) 5 MG TABS tablet 60 tablet 0    Sig: Take 1 tablet (5 mg total) by mouth 2 (two) times daily.     Hematology:  Anticoagulants Failed - 04/21/2021  1:18 PM      Failed - HGB in normal range and within 360 days    Hemoglobin  Date Value Ref Range Status  08/31/2020 9.4 (L) 12.0 - 15.0 g/dL Final          Failed - HCT in normal range and within 360 days    HCT  Date Value Ref Range Status  08/31/2020 30.0 (L) 36.0 - 46.0 % Final          Failed - Cr in normal range and within 360 days    Creatinine, Ser  Date Value Ref Range Status  08/31/2020 1.74 (H) 0.44 - 1.00 mg/dL Final          Passed - PLT in normal range and within 360 days    Platelets  Date Value Ref Range Status  08/31/2020 279 150 - 400 K/uL Final          Passed - Valid encounter within last 12 months    Recent Outpatient Visits           2 months ago [facility])   [facility] [place], Coralie Keens, NP

## 2021-04-22 ENCOUNTER — Other Ambulatory Visit: Payer: Self-pay | Admitting: Internal Medicine

## 2021-04-22 MED ORDER — APIXABAN 5 MG PO TABS: 5.0000 mg | ORAL_TABLET | Freq: Two times a day (BID) | ORAL | 0 refills | Status: DC

## 2021-05-20 ENCOUNTER — Telehealth: Payer: Self-pay

## 2021-05-20 NOTE — Telephone Encounter (Signed)
Copied from [place] [id number]. Topic: General - Other >> May 20, 2021 11:44 AM Alanda Slim E wrote: Reason for CRM: FYI/ pt wanted to let regina know that she will now be using [facility] mail order pharmacy / and [facility] should be calling soon to get information for medications

## 2021-05-20 NOTE — Telephone Encounter (Signed)
FYI

## 2021-05-23 ENCOUNTER — Other Ambulatory Visit: Payer: Self-pay

## 2021-05-23 MED ORDER — FLUOXETINE HCL 20 MG PO CAPS: 60.0000 mg | ORAL_CAPSULE | ORAL | 0 refills | Status: DC

## 2021-05-23 MED ORDER — VALACYCLOVIR HCL 1 G PO TABS: 1000.0000 mg | ORAL_TABLET | Freq: Every day | ORAL | 2 refills | Status: AC | PRN

## 2021-05-23 MED ORDER — CALCITRIOL 0.25 MCG PO CAPS: 0.2500 ug | ORAL_CAPSULE | Freq: Every day | ORAL | 1 refills | Status: DC

## 2021-05-23 MED ORDER — BUSPIRONE HCL 15 MG PO TABS: 15.0000 mg | ORAL_TABLET | Freq: Two times a day (BID) | ORAL | 0 refills | Status: DC

## 2021-05-23 MED ORDER — PREGABALIN 100 MG PO CAPS: 100.0000 mg | ORAL_CAPSULE | Freq: Three times a day (TID) | ORAL | 0 refills | Status: AC

## 2021-05-23 MED ORDER — APIXABAN 5 MG PO TABS: 5.0000 mg | ORAL_TABLET | Freq: Two times a day (BID) | ORAL | 1 refills | Status: DC

## 2021-05-23 NOTE — Telephone Encounter (Signed)
LOV: 02/03/2021  NOV: None

## 2021-05-23 NOTE — Telephone Encounter (Signed)
noted 

## 2021-06-20 ENCOUNTER — Emergency Department
Admission: EM | Admit: 2021-06-20 | Discharge: 2021-06-21 | Disposition: A | Discharge status: Home / Self Care | Payer: Medicare PPO | Attending: Emergency Medicine | Admitting: Emergency Medicine

## 2021-06-20 ENCOUNTER — Other Ambulatory Visit: Payer: Self-pay

## 2021-06-20 DIAGNOSIS — R001 Bradycardia, unspecified: Secondary | ICD-10-CM | POA: Diagnosis not present

## 2021-06-20 DIAGNOSIS — I129 Hypertensive chronic kidney disease with stage 1 through stage 4 chronic kidney disease, or unspecified chronic kidney disease: Secondary | ICD-10-CM | POA: Diagnosis not present

## 2021-06-20 DIAGNOSIS — R55 Syncope and collapse: Secondary | ICD-10-CM

## 2021-06-20 DIAGNOSIS — N189 Chronic kidney disease, unspecified: Secondary | ICD-10-CM | POA: Diagnosis not present

## 2021-06-20 LAB — CBC WITH DIFFERENTIAL/PLATELET
Abs Immature Granulocytes: 0.02 10*3/uL (ref 0.00–0.07)
Basophils Absolute: 0.1 10*3/uL (ref 0.0–0.1)
Basophils Relative: 1 %
Eosinophils Absolute: 0.2 10*3/uL (ref 0.0–0.5)
Eosinophils Relative: 3 %
HCT: 41.2 % (ref 36.0–46.0)
Hemoglobin: 13.5 g/dL (ref 12.0–15.0)
Immature Granulocytes: 0 %
Lymphocytes Relative: 43 %
Lymphs Abs: 2.9 10*3/uL (ref 0.7–4.0)
MCH: 32.7 pg (ref 26.0–34.0)
MCHC: 32.8 g/dL (ref 30.0–36.0)
MCV: 99.8 fL (ref 80.0–100.0)
Monocytes Absolute: 0.5 10*3/uL (ref 0.1–1.0)
Monocytes Relative: 7 %
Neutro Abs: 3.1 10*3/uL (ref 1.7–7.7)
Neutrophils Relative %: 46 %
Platelets: 220 10*3/uL (ref 150–400)
RBC: 4.13 MIL/uL (ref 3.87–5.11)
RDW: 14.4 % (ref 11.5–15.5)
WBC: 6.7 10*3/uL (ref 4.0–10.5)
nRBC: 0 % (ref 0.0–0.2)

## 2021-06-20 NOTE — ED Notes (Signed)
Pt's boyfriend, Herbie Baltimore, updated on pt condition and status of workup.

## 2021-06-20 NOTE — ED Provider Notes (Signed)
[facility] Provider Note    Event Date/Time   First MD Initiated Contact with Patient 06/20/21 2217     (approximate)   History   Loss of Consciousness   HPI  Ann Russell is a 81 y.o. female  who, per office note dated 02/03/2021 has history of CKD, HTN, neuropathy, anemia who presents to the emergency department today because of concern for a syncopal episode. The patient states that over the past roughly week she has been feeling dizzy and has had near syncopal episodes. She does state that she has a hard time at baseline walking because of her neuropathy but that she has had to sit down over the past week to not pass out. Today however she did black out completely. The patient says she was around people when she passed out today so did not fall or hurt herself. She denies any associated chest pain or shortness of breath. Denies any recent fevers.     Physical Exam   Triage Vital Signs: ED Triage Vitals  Enc Vitals Group     BP 06/20/21 2200 121/77     Pulse Rate 06/20/21 2200 62     Resp 06/20/21 2200 16     Temp 06/20/21 2212 97.7 F (36.5 C)     Temp Source 06/20/21 2212 Oral     SpO2 06/20/21 2200 98 %     Weight 06/20/21 2145 171 lb 15.3 oz (78 kg)     Height 06/20/21 2145 5\' 8"  (1.727 m)     Head Circumference --      Peak Flow --      Pain Score 06/20/21 2200 0     Pain Loc --      Pain Edu? --      Excl. in [place]? --     Most recent vital signs: Vitals:   06/20/21 2200 06/20/21 2212  BP: 121/77   Pulse: 62   Resp: 16   Temp:  97.7 F (36.5 C)  SpO2: 98%     General: Awake, no distress.  CV:  Good peripheral perfusion. No murmus Resp:  Normal effort. Clear to auscultation Abd:  No distention. Non tender MSK:  No spinal tenderness.   ED Results / Procedures / Treatments   Labs (all labs ordered are listed, but only abnormal results are displayed) Labs Reviewed  CBC WITH DIFFERENTIAL/PLATELET  BASIC METABOLIC PANEL   TROPONIN I (HIGH SENSITIVITY)     EKG  I, Nance Pear, attending physician, personally viewed and interpreted this EKG  EKG Time: 2150 Rate: 57 Rhythm: sinus bradycardia Axis: normal Intervals: qtc 434 QRS: narrow ST changes: no st elevation Impression: abnormal ekg  RADIOLOGY None   PROCEDURES:  Critical Care performed: No  Procedures   MEDICATIONS ORDERED IN ED: Medications - No data to display   IMPRESSION / MDM / [place] / ED COURSE  I reviewed the triage vital signs and the nursing notes.                              Differential diagnosis includes, but is not limited to, anemia, arrhythmia, acs, electrolyte abnormality, vaso vagal.  Patient presented to the emergency department today because of concerns for a syncopal episode.  She states over the past week she has been having near syncopal episodes but today was the first time she completely blacked out.  Denies any traumatic injuries.  On  exam patient is awake alert and appropriate.  EKG without concerning arrhythmia.  We will check blood work to evaluate for possible anemia, electrolyte abnormality, ACS.  FINAL CLINICAL IMPRESSION(S) / ED DIAGNOSES   Final diagnoses:  Syncope, unspecified syncope type       Note:  This document was prepared using Dragon voice recognition software and may include unintentional dictation errors.    Nance Pear, MD 06/20/21 [phone]

## 2021-06-20 NOTE — ED Notes (Signed)
Pt notified of need for urine sample, purewick applied, pt indicates verbal understanding, requested and provided water and states she will try to provide a urine sample.

## 2021-06-21 LAB — TROPONIN I (HIGH SENSITIVITY)
Troponin I (High Sensitivity): 24 ng/L — ABNORMAL HIGH (ref <18)
Troponin I (High Sensitivity): 24 ng/L — ABNORMAL HIGH (ref <18)

## 2021-06-21 LAB — BASIC METABOLIC PANEL
Anion gap: 8 (ref 5–15)
BUN: 54 mg/dL — ABNORMAL HIGH (ref 8–23)
CO2: 24 mmol/L (ref 22–32)
Calcium: 10.2 mg/dL (ref 8.9–10.3)
Chloride: 106 mmol/L (ref 98–111)
Creatinine, Ser: 1.57 mg/dL — ABNORMAL HIGH (ref 0.44–1.00)
GFR, Estimated: 33 mL/min — ABNORMAL LOW (ref >60)
Glucose, Bld: 118 mg/dL — ABNORMAL HIGH (ref 70–99)
Potassium: 4.1 mmol/L (ref 3.5–5.1)
Sodium: 138 mmol/L (ref 135–145)

## 2021-06-21 MED ORDER — SODIUM CHLORIDE 0.9 % IV BOLUS
500.0000 mL | Freq: Once | INTRAVENOUS | Status: AC
  Administered 2021-06-21: 500 mL via INTRAVENOUS

## 2021-06-21 NOTE — ED Notes (Signed)
Spoke to [place], he will be on the way to take patient home in approximately 30 minutes. Patient updated, resting quietly in bed, verbal understanding indicated.

## 2021-06-21 NOTE — Discharge Instructions (Signed)
Your tests today are all reassuring.  Increase your water intake over the next few days to see if this helps with your symptoms.  Please follow-up with your primary care doctor this week for further evaluation.

## 2021-06-21 NOTE — ED Notes (Signed)
Attempted to call [name] at number provided, no answer, will try to call again for pt discharge.

## 2021-06-21 NOTE — ED Notes (Signed)
Attempted again to call Herbie Baltimore for patient discharge ride, unable to reach him, message left on answering machine. Will attempt to call again.

## 2021-06-21 NOTE — ED Provider Notes (Signed)
Procedures     ----------------------------------------- 2:34 AM on 06/21/2021 ----------------------------------------- Lab work-up unremarkable.  Serial troponins are flat.  Orthostatic vitals do show some change in hemodynamics with change in position, but no frank tachycardia or hypotension.  Patient received some IV fluids in the ED, and I will encourage her to increase fluid intake over the next few days to ensure good hydration.  Not requiring admission due to the reassuring work-up.     Carrie Mew, MD 06/21/21 [phone]

## 2021-06-23 ENCOUNTER — Emergency Department: Payer: Medicare PPO

## 2021-06-23 ENCOUNTER — Other Ambulatory Visit: Payer: Self-pay

## 2021-06-23 ENCOUNTER — Ambulatory Visit: Payer: Medicare PPO | Admitting: Internal Medicine

## 2021-06-23 ENCOUNTER — Emergency Department
Admission: EM | Admit: 2021-06-23 | Discharge: 2021-06-23 | Disposition: A | Discharge status: Home / Self Care | Payer: Medicare PPO | Attending: Emergency Medicine | Admitting: Emergency Medicine

## 2021-06-23 ENCOUNTER — Encounter: Payer: Self-pay | Admitting: Internal Medicine

## 2021-06-23 VITALS — BP 100/76 | HR 91 | Temp 96.9°F

## 2021-06-23 DIAGNOSIS — R1013 Epigastric pain: Secondary | ICD-10-CM | POA: Diagnosis not present

## 2021-06-23 DIAGNOSIS — R001 Bradycardia, unspecified: Secondary | ICD-10-CM | POA: Insufficient documentation

## 2021-06-23 DIAGNOSIS — I129 Hypertensive chronic kidney disease with stage 1 through stage 4 chronic kidney disease, or unspecified chronic kidney disease: Secondary | ICD-10-CM | POA: Insufficient documentation

## 2021-06-23 DIAGNOSIS — E860 Dehydration: Secondary | ICD-10-CM

## 2021-06-23 DIAGNOSIS — R079 Chest pain, unspecified: Secondary | ICD-10-CM | POA: Diagnosis not present

## 2021-06-23 DIAGNOSIS — N183 Chronic kidney disease, stage 3 unspecified: Secondary | ICD-10-CM

## 2021-06-23 DIAGNOSIS — R109 Unspecified abdominal pain: Secondary | ICD-10-CM | POA: Diagnosis not present

## 2021-06-23 DIAGNOSIS — R350 Frequency of micturition: Secondary | ICD-10-CM | POA: Diagnosis not present

## 2021-06-23 DIAGNOSIS — N189 Chronic kidney disease, unspecified: Secondary | ICD-10-CM | POA: Insufficient documentation

## 2021-06-23 DIAGNOSIS — R55 Syncope and collapse: Secondary | ICD-10-CM

## 2021-06-23 DIAGNOSIS — R42 Dizziness and giddiness: Secondary | ICD-10-CM | POA: Diagnosis present

## 2021-06-23 DIAGNOSIS — Z20822 Contact with and (suspected) exposure to covid-19: Secondary | ICD-10-CM | POA: Insufficient documentation

## 2021-06-23 DIAGNOSIS — R4182 Altered mental status, unspecified: Secondary | ICD-10-CM | POA: Diagnosis present

## 2021-06-23 DIAGNOSIS — R4189 Other symptoms and signs involving cognitive functions and awareness: Secondary | ICD-10-CM

## 2021-06-23 LAB — COMPLETE METABOLIC PANEL WITH GFR
AG Ratio: 1.7 (calc) (ref 1.0–2.5)
ALT: 12 U/L (ref 6–29)
AST: 19 U/L (ref 10–35)
Albumin: 3.8 g/dL (ref 3.6–5.1)
Alkaline phosphatase (APISO): 50 U/L (ref 37–153)
BUN/Creatinine Ratio: 23 (calc) — ABNORMAL HIGH (ref 6–22)
BUN: 37 mg/dL — ABNORMAL HIGH (ref 7–25)
CO2: 24 mmol/L (ref 20–32)
Calcium: 10.5 mg/dL — ABNORMAL HIGH (ref 8.6–10.4)
Chloride: 105 mmol/L (ref 98–110)
Creat: 1.58 mg/dL — ABNORMAL HIGH (ref 0.60–1.00)
Globulin: 2.3 g/dL (calc) (ref 1.9–3.7)
Glucose, Bld: 104 mg/dL — ABNORMAL HIGH (ref 65–99)
Potassium: 4.1 mmol/L (ref 3.5–5.3)
Sodium: 138 mmol/L (ref 135–146)
Total Bilirubin: 0.4 mg/dL (ref 0.2–1.2)
Total Protein: 6.1 g/dL (ref 6.1–8.1)
eGFR: 33 mL/min/{1.73_m2} — ABNORMAL LOW (ref >=60)

## 2021-06-23 LAB — PROTIME-INR
INR: 1.4 — ABNORMAL HIGH (ref 0.8–1.2)
Prothrombin Time: 17.2 seconds — ABNORMAL HIGH (ref 11.4–15.2)

## 2021-06-23 LAB — COMPREHENSIVE METABOLIC PANEL
ALT: 11 U/L (ref 0–44)
AST: 28 U/L (ref 15–41)
Albumin: 2.8 g/dL — ABNORMAL LOW (ref 3.5–5.0)
Alkaline Phosphatase: 37 U/L — ABNORMAL LOW (ref 38–126)
Anion gap: 9 (ref 5–15)
BUN: 26 mg/dL — ABNORMAL HIGH (ref 8–23)
CO2: 23 mmol/L (ref 22–32)
Calcium: 9.3 mg/dL (ref 8.9–10.3)
Chloride: 104 mmol/L (ref 98–111)
Creatinine, Ser: 1.3 mg/dL — ABNORMAL HIGH (ref 0.44–1.00)
GFR, Estimated: 42 mL/min — ABNORMAL LOW (ref >60)
Glucose, Bld: 90 mg/dL (ref 70–99)
Potassium: 4.3 mmol/L (ref 3.5–5.1)
Sodium: 136 mmol/L (ref 135–145)
Total Bilirubin: 1 mg/dL (ref 0.3–1.2)
Total Protein: 5.3 g/dL — ABNORMAL LOW (ref 6.5–8.1)

## 2021-06-23 LAB — CBC WITH DIFFERENTIAL/PLATELET
Abs Immature Granulocytes: 0.03 10*3/uL (ref 0.00–0.07)
Basophils Absolute: 0 10*3/uL (ref 0.0–0.1)
Basophils Relative: 0 %
Eosinophils Absolute: 0.1 10*3/uL (ref 0.0–0.5)
Eosinophils Relative: 1 %
HCT: 41.1 % (ref 36.0–46.0)
Hemoglobin: 13.4 g/dL (ref 12.0–15.0)
Immature Granulocytes: 0 %
Lymphocytes Relative: 31 %
Lymphs Abs: 2.1 10*3/uL (ref 0.7–4.0)
MCH: 32.8 pg (ref 26.0–34.0)
MCHC: 32.6 g/dL (ref 30.0–36.0)
MCV: 100.5 fL — ABNORMAL HIGH (ref 80.0–100.0)
Monocytes Absolute: 0.5 10*3/uL (ref 0.1–1.0)
Monocytes Relative: 8 %
Neutro Abs: 4 10*3/uL (ref 1.7–7.7)
Neutrophils Relative %: 60 %
Platelets: 234 10*3/uL (ref 150–400)
RBC: 4.09 MIL/uL (ref 3.87–5.11)
RDW: 14.5 % (ref 11.5–15.5)
WBC: 6.8 10*3/uL (ref 4.0–10.5)
nRBC: 0 % (ref 0.0–0.2)

## 2021-06-23 LAB — SALICYLATE LEVEL: Salicylate Lvl: <7 mg/dL — ABNORMAL LOW (ref 7.0–30.0)

## 2021-06-23 LAB — RESP PANEL BY RT-PCR (FLU A&B, COVID) ARPGX2
Influenza A by PCR: NEGATIVE
Influenza B by PCR: NEGATIVE
SARS Coronavirus 2 by RT PCR: NEGATIVE

## 2021-06-23 LAB — ETHANOL: Alcohol, Ethyl (B): <10 mg/dL (ref <10)

## 2021-06-23 LAB — ACETAMINOPHEN LEVEL: Acetaminophen (Tylenol), Serum: <10 ug/mL — ABNORMAL LOW (ref 10–30)

## 2021-06-23 LAB — TROPONIN I (HIGH SENSITIVITY)
Troponin I (High Sensitivity): 38 ng/L — ABNORMAL HIGH (ref <18)
Troponin I (High Sensitivity): 37 ng/L — ABNORMAL HIGH (ref <18)

## 2021-06-23 LAB — APTT: aPTT: 32 seconds (ref 24–36)

## 2021-06-23 LAB — LIPASE, BLOOD: Lipase: 52 U/L — ABNORMAL HIGH (ref 11–51)

## 2021-06-23 MED ORDER — HYDROCODONE-ACETAMINOPHEN 5-325 MG PO TABS: 1.0000 | ORAL_TABLET | Freq: Three times a day (TID) | ORAL | 0 refills | Status: DC | PRN

## 2021-06-23 MED ORDER — OMEPRAZOLE 20 MG PO CPDR: 20.0000 mg | DELAYED_RELEASE_CAPSULE | Freq: Every day | ORAL | 0 refills | Status: DC

## 2021-06-23 MED ORDER — IOHEXOL 350 MG/ML SOLN
80.0000 mL | Freq: Once | INTRAVENOUS | Status: AC | PRN
Start: 2021-06-23 — End: 2021-06-23
  Administered 2021-06-23: 80 mL via INTRAVENOUS

## 2021-06-23 MED ORDER — LACTATED RINGERS IV BOLUS
1000.0000 mL | Freq: Once | INTRAVENOUS | Status: AC
Start: 2021-06-23 — End: 2021-06-23
  Administered 2021-06-23: 1000 mL via INTRAVENOUS

## 2021-06-23 NOTE — Patient Instructions (Signed)
Abdominal Pain, Adult Many things can cause belly (abdominal) pain. Most times, belly pain is not dangerous. Many cases of belly pain can be watched and treated at home. Sometimes, though, belly pain is serious. Yourdoctor will try to find the cause of your belly pain. Follow these instructions at home:  Medicines Take over-the-counter and prescription medicines only as told by your doctor. Do not take medicines that help you poop (laxatives) unless told by your doctor. General instructions Watch your belly pain for any changes. Drink enough fluid to keep your pee (urine) pale yellow. Keep all follow-up visits as told by your doctor. This is important. Contact a doctor if: Your belly pain changes or gets worse. You are not hungry, or you lose weight without trying. You are having trouble pooping (constipated) or have watery poop (diarrhea) for more than 2-3 days. You have pain when you pee or poop. Your belly pain wakes you up at night. Your pain gets worse with meals, after eating, or with certain foods. You are vomiting and cannot keep anything down. You have a fever. You have blood in your pee. Get help right away if: Your pain does not go away as soon as your doctor says it should. You cannot stop vomiting. Your pain is only in areas of your belly, such as the right side or the left lower part of the belly. You have bloody or black poop, or poop that looks like tar. You have very bad pain, cramping, or bloating in your belly. You have signs of not having enough fluid or water in your body (dehydration), such as: Dark pee, very little pee, or no pee. Cracked lips. Dry mouth. Sunken eyes. Sleepiness. Weakness. You have trouble breathing or chest pain. Summary Many cases of belly pain can be watched and treated at home. Watch your belly pain for any changes. Take over-the-counter and prescription medicines only as told by your doctor. Contact a doctor if your belly pain  changes or gets worse. Get help right away if you have very bad pain, cramping, or bloating in your belly. This information is not intended to replace advice given to you by your health care provider. Make sure you discuss any questions you have with your healthcare provider. Document Revised: 08/19/2018 Document Reviewed: 08/19/2018 Elsevier Patient Education  2022 Elsevier Inc.  

## 2021-06-23 NOTE — ED Triage Notes (Signed)
Pt presents to ED with c/o of syncope at [facility].  ?

## 2021-06-23 NOTE — ED Notes (Signed)
Bp noted to be 181/86. Provider made aware ?

## 2021-06-23 NOTE — ED Provider Notes (Signed)
? ?[facility] ?Provider Note ? ? ? Event Date/Time  ? First MD Initiated Contact with Patient 06/23/21 1707   ?  (approximate) ? ? ?History  ? ?Loss of Consciousness ? ? ?HPI ? ?Ann Russell is a 81 y.o. female with a history of hypertension, CKD who comes ED due to syncope.  Patient was at a doctor's office, undergoing phlebotomy lab draw when she got lightheaded, felt nauseated, and then passed out.  She denies any acute preceding pain.   No fall or significant trauma. ? ?She does report that over the last several days she has had some lower abdominal cramping pain which is waxing and waning, no aggravating or alleviating factors, radiates to her back, currently present and moderate intensity.  No black or bloody stool.  No pain with eating ? ?EMS reports the patient was initially confused, but that has resolved and mental status seems back to normal.  Patient states that she feels fine. ? ?She does report that she has not been feeling well for the past several days, not eating well.  She was seen in the ED a few days ago with reassuring work-up and discharged home.  She denies any chest pain shortness of breath  back pain thunderclap headache vision changes paresthesias or weakness. ?  ? ? ?Physical Exam  ? ?Triage Vital Signs: ?ED Triage Vitals  ?Enc Vitals Group  ?   BP 06/23/21 1700 126/85  ?   Pulse Rate 06/23/21 1700 (!) 57  ?   Resp 06/23/21 1700 19  ?   Temp 06/23/21 1700 98.1 ?F (36.7 ?C)  ?   Temp Source 06/23/21 1700 Oral  ?   SpO2 06/23/21 1700 96 %  ?   Weight --   ?   Height --   ?   Head Circumference --   ?   Peak Flow --   ?   Pain Score 06/23/21 1704 0  ?   Pain Loc --   ?   Pain Edu? --   ?   Excl. in [place]? --   ? ? ?Most recent vital signs: ?Vitals:  ? 06/23/21 2010 06/23/21 2130  ?BP: (!) 185/83 (!) 181/86  ?Pulse: (!) 59 (!) 52  ?Resp: 15 14  ?Temp: (!) 97.5 ?F (36.4 ?C)   ?SpO2: 99% 97%  ? ? ? ?General: Awake, no distress.  ?CV:  Good peripheral perfusion.  Regular rate  and rhythm ?Resp:  Normal effort.  Clear to auscultation bilaterally ?Abd:  No distention.  Soft with mild diffuse lower abdominal tenderness ?Other:  Trace peripheral edema, symmetric.  No calf tenderness.  Dry mucous membranes. ? ? ?ED Results / Procedures / Treatments  ? ?Labs ?(all labs ordered are listed, but only abnormal results are displayed) ?Labs Reviewed  ?CBC WITH DIFFERENTIAL/PLATELET - Abnormal; Notable for the following components:  ?    Result Value  ? MCV 100.5 (*)   ? All other components within normal limits  ?SALICYLATE LEVEL - Abnormal; Notable for the following components:  ? Salicylate Lvl <5.9 (*)   ? All other components within normal limits  ?PROTIME-INR - Abnormal; Notable for the following components:  ? Prothrombin Time 17.2 (*)   ? INR 1.4 (*)   ? All other components within normal limits  ?ACETAMINOPHEN LEVEL - Abnormal; Notable for the following components:  ? Acetaminophen (Tylenol), Serum <10 (*)   ? All other components within normal limits  ?COMPREHENSIVE METABOLIC PANEL - Abnormal;  Notable for the following components:  ? BUN 26 (*)   ? Creatinine, Ser 1.30 (*)   ? Total Protein 5.3 (*)   ? Albumin 2.8 (*)   ? Alkaline Phosphatase 37 (*)   ? GFR, Estimated 42 (*)   ? All other components within normal limits  ?LIPASE, BLOOD - Abnormal; Notable for the following components:  ? Lipase 52 (*)   ? All other components within normal limits  ?TROPONIN I (HIGH SENSITIVITY) - Abnormal; Notable for the following components:  ? Troponin I (High Sensitivity) 38 (*)   ? All other components within normal limits  ?TROPONIN I (HIGH SENSITIVITY) - Abnormal; Notable for the following components:  ? Troponin I (High Sensitivity) 37 (*)   ? All other components within normal limits  ?RESP PANEL BY RT-PCR (FLU A&B, COVID) ARPGX2  ?APTT  ?ETHANOL  ?URINALYSIS, ROUTINE W REFLEX MICROSCOPIC  ? ? ? ?EKG ? ?Interpreted by me ?Sinus bradycardia rate of 55.  Normal axis and intervals.  Normal QRS ST  segments and T waves.  No acute ischemic changes. ? ? ?RADIOLOGY ?Chest x-ray viewed and interpreted by me, appears unremarkable.  Radiology report reviewed. ? ?CT head pending ? ?CT angiogram chest abdomen pelvis pending ? ? ?PROCEDURES: ? ?Critical Care performed: No ? ?.1-3 Lead EKG Interpretation ?Performed by: Carrie Mew, MD ?Authorized by: Carrie Mew, MD  ? ?  Interpretation: abnormal   ?  ECG rate:  55 ?  ECG rate assessment: bradycardic   ?  Rhythm: sinus rhythm   ?  Ectopy: none   ?  Conduction: normal   ?Comments:  ?    ? ? ? ? ?MEDICATIONS ORDERED IN ED: ?Medications  ?lactated ringers bolus 1,000 mL (0 mLs Intravenous Stopped 06/23/21 1826)  ?iohexol (OMNIPAQUE) 350 MG/ML injection 80 mL (80 mLs Intravenous Contrast Given 06/23/21 1944)  ? ? ? ?IMPRESSION / MDM / ASSESSMENT AND PLAN / ED COURSE  ?I reviewed the triage vital signs and the nursing notes. ?             ?               ? ?Differential diagnosis includes, but is not limited to, intracranial hemorrhage, aortic dissection, AAA, mesenteric ischemia, viral illness, diverticulitis, GI perforation, intra-abdominal abscess, pancreatitis ? ?**The patient is on the cardiac monitor to evaluate for evidence of arrhythmia and/or significant heart rate changes.**} ? ?Patient presents with abdominal pain and syncope.  Not in distress currently, vital signs are normal.  Due to age and comorbidities, will obtain lab work, CT imaging. ? ? ?----------------------------------------- ?10:35 PM on 06/23/2021 ?----------------------------------------- ?CT head and CT angiogram chest abdomen pelvis all unremarkable.  Serial findings are flat.  Chemistry panel is unremarkable.  Symptoms have resolved.  Patient is sitting upright, drinking fluids and tolerating p.o.  Blood pressure is severely elevated, but due to report of initial low blood pressure, the most cautious approach is to avoid the risk of hypotension from starting antihypertensives currently.   I will have her follow-up closely with primary care for blood pressure recheck to initiate blood pressure medicine if it remains elevated.  Recommend follow-up with cardiology as well for further evaluation of syncope. ?  ? ? ?FINAL CLINICAL IMPRESSION(S) / ED DIAGNOSES  ? ?Final diagnoses:  ?Vasovagal syncope  ? ? ? ?Rx / DC Orders  ? ?ED Discharge Orders   ? ? None  ? ?  ? ? ? ?Note:  This document was  prepared using Systems analyst and may include unintentional dictation errors. ?  ?Carrie Mew, MD ?06/23/21 2237 ? ?

## 2021-06-23 NOTE — Discharge Instructions (Signed)
Your blood tests and CT scans today were all okay.  Please follow-up with your primary care doctor for blood pressure recheck, as your blood pressure was significantly elevated today. ? ?Please follow up with cardiology for further evaluation of your passing out symptoms. ?

## 2021-06-23 NOTE — Progress Notes (Signed)
Subjective:    Patient ID: Ann Russell, female    DOB: 1940/09/12, 81 y.o.   MRN: [id number]  HPI  Pt presents to the clinic today for ER follow up. She went to the ER 2/27 after a syncopal episode. She did not hit her head but did lose consciousness. ECG was unremarkable. Labs showed resolved anemia, stable CKD, no electrolyte abnormalities. Troponin was not concerning for ACS. She was treated with IV fluids and discharged, advised to follow up with her PCP. Since discharge, she has not felt syncopal however she has not been feeling well.  She reports pain in the epigastric region.  She describes the pain as sharp and stabbing.  The pain does not radiate.  She does feel like she is having some heartburn.  The pain is not worse with taking a deep breath.  She denies cough, chest pain, shortness of breath, nausea, vomiting, constipation, diarrhea or blood in her stool.  She has taken Tylenol OTC with minimal relief of symptoms.  She reports they did have difficulty lifting her up to get her onto the stretcher and thinks maybe this could be muscular pain.  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  Takes Vasotec for kidneys.-stage 3   Depression    Hyperlipemia    Hypertension    h/o had gastric bypass and was able to come off meds   Neuropathy    Pulmonary embolism ([facility])    Sleep apnea    no cpap since her gastric bypass    Current Outpatient Medications  Medication Sig Dispense Refill   acetaminophen (TYLENOL) 650 MG CR tablet Take 650 mg by mouth 2 (two) times a day.     apixaban (ELIQUIS) 5 MG TABS tablet Take 1 tablet (5 mg total) by mouth 2 (two) times daily. 180 tablet 1   B COMPLEX VITAMINS SL Place 1 tablet under the tongue daily.     bisacodyl (DULCOLAX) 5 MG EC tablet Take 1 tablet (5 mg total) by mouth daily as needed for moderate constipation. 30  tablet 0   busPIRone (BUSPAR) 15 MG tablet Take 1 tablet (15 mg total) by mouth 2 (two) times daily. 180 tablet 0   calcitRIOL (ROCALTROL) 0.25 MCG capsule Take 1 capsule (0.25 mcg total) by mouth daily. 90 capsule 1   Calcium Carb-Cholecalciferol (CALCIUM 600 + D PO) Take 1 tablet by mouth daily.     denosumab (PROLIA) 60 MG/ML SOSY injection Inject 60 mg into the skin every 6 (six) months.     ferrous [id number] C-folic acid (TRINSICON / FOLTRIN) capsule Take 1 capsule by mouth 2 (two) times daily. 20 capsule    FLUoxetine (PROZAC) 20 MG capsule Take 3 capsules (60 mg total) by mouth every morning. 270 capsule 0   Multiple Vitamin (MULTI-VITAMINS) TABS Take 2 tablets by mouth daily.      pregabalin (LYRICA) 100 MG capsule Take 1 capsule (100 mg total) by mouth 3 (three) times daily. 270 capsule 0   Pyridoxine HCl (VITAMIN B6 PO) Take 1 tablet by mouth daily. With b12     valACYclovir (VALTREX) 1000 MG tablet Take 1 tablet (1,000 mg total) by mouth daily as needed (fever blisters). 30 tablet 2   No current facility-administered medications for this visit.    No Known  Allergies  Family History  Problem Relation Age of Onset   Heart disease Mother    Dementia Mother    Neuropathy Father    Diabetes Brother    Breast cancer Neg Hx     Social History   Socioeconomic History   Marital status: Significant Other    Spouse name: Not on file   Number of children: 1   Years of education: Not on file   Highest education level: Not on file  Occupational History   Occupation: retired  Tobacco Use   Smoking status: Never   Smokeless tobacco: Never  Vaping Use   Vaping Use: Never used  Substance and Sexual Activity   Alcohol use: No   Drug use: No   Sexual activity: Not Currently  Other Topics Concern   Not on file  Social History Narrative   Not on file   Social Determinants of Health   Financial Resource Strain: Not on file  Food Insecurity: Not on file   Transportation Needs: Not on file  Physical Activity: Not on file  Stress: Not on file  Social Connections: Not on file  Intimate Partner Violence: Not on file     Constitutional: Patient reports fatigue.  Denies fever, malaise, headache or abrupt weight changes.  HEENT: Denies eye pain, eye redness, ear pain, ringing in the ears, wax buildup, runny nose, nasal congestion, bloody nose, or sore throat. Respiratory: Denies difficulty breathing, shortness of breath, cough or sputum production.   Cardiovascular: Denies chest pain, chest tightness, palpitations or swelling in the hands or feet.  Gastrointestinal: Patient reports epigastric pain and heartburn.  Denies abdominal pain, bloating, constipation, diarrhea or blood in the stool.  GU: Denies urgency, frequency, pain with urination, burning sensation, blood in urine, odor or discharge. Musculoskeletal: Patient reports generalized weakness, difficulty with gait.  Denies decrease in range of motion, muscle pain or joint pain and swelling.  Skin: Denies redness, rashes, lesions or ulcercations.  Neurological: Patient reports difficulty with balance.  Denies dizziness, difficulty with memory, difficulty with speech or problems with coordination.    No other specific complaints in a complete review of systems (except as listed in HPI above).     Objective:   Physical Exam  BP 100/76 (BP Location: Right Arm, Patient Position: Sitting, Cuff Size: Normal)    Pulse 91    Temp (!) 96.9 F (36.1 C) (Temporal)    SpO2 99%   Wt Readings from Last 3 Encounters:  06/20/21 171 lb 15.3 oz (78 kg)  02/03/21 173 lb (78.5 kg)  09/15/20 170 lb (77.1 kg)    General: Appears her stated age, appears unwell but in NAD. Skin: Warm, dry and intact. No rashes noted. HEENT: Head: normal shape and size; Eyes: sclera white and EOMs intact;  Cardiovascular: Normal rate and rhythm. S1,S2 noted.  No murmur, rubs or gallops noted.  Pulmonary/Chest: Normal  effort and positive vesicular breath sounds. No respiratory distress. No wheezes, rales or ronchi noted.  Abdomen: Soft and tender in the epigastric region.  Normal bowel sounds. No distention or masses noted.  Musculoskeletal: No pain with palpation of the ribs.  Needing to use a wheelchair today. Neurological: Alert and oriented.   BMET    Component Value Date/Time   NA 138 06/20/2021 2151   K 4.1 06/20/2021 2151   CL 106 06/20/2021 2151   CO2 24 06/20/2021 2151   GLUCOSE 118 (H) 06/20/2021 2151   BUN 54 (H) 06/20/2021 2151   CREATININE  1.57 (H) 06/20/2021 2151   CALCIUM 10.2 06/20/2021 2151   GFRNONAA 33 (L) 06/20/2021 2151   GFRAA 30 (L) 09/20/2019 1418    Lipid Panel  No results found for: CHOL, TRIG, HDL, CHOLHDL, VLDL, LDLCALC  CBC    Component Value Date/Time   WBC 6.7 06/20/2021 2151   RBC 4.13 06/20/2021 2151   HGB 13.5 06/20/2021 2151   HCT 41.2 06/20/2021 2151   PLT 220 06/20/2021 2151   MCV 99.8 06/20/2021 2151   MCH 32.7 06/20/2021 2151   MCHC 32.8 06/20/2021 2151   RDW 14.4 06/20/2021 2151   LYMPHSABS 2.9 06/20/2021 2151   MONOABS 0.5 06/20/2021 2151   EOSABS 0.2 06/20/2021 2151   BASOSABS 0.1 06/20/2021 2151    Hgb A1C No results found for: HGBA1C          Assessment & Plan:   ER Follow Up for Syncope, Dehydration, CKD, now having Urinary Frequency and Epigastric Pain:  ER notes, labs and procedures reviewed We will check urinalysis, c-Met and H. pylori breath test Rx for Omeprazole 20 mg daily x2 weeks Advised her to try heating pad in case this is muscular Advised her if her symptoms were worse over the weekend to go to the ER Advised her if symptoms persist but are not worse to notify me on Monday and we will consider CT abdomen pelvis.  Of note, as she went down to get her labs drawn, she had an unresponsive episode.  When I initially evaluated her, she was unresponsive, not tracking with her eyes.  Her mouth was tense and her left leg  was leaning to the left.  After a few minutes she returned back to baseline.  She was able to answer questions and was alert and oriented to person and place.  Her left leg continued to lean to the left.  EMS was called and she was transported to [facility].  Return precautions discussed Webb Silversmith, NP This visit occurred during the SARS-CoV-2 public health emergency.  Safety protocols were in place, including screening questions prior to the visit, additional usage of staff PPE, and extensive cleaning of exam room while observing appropriate contact time as indicated for disinfecting solutions.

## 2021-06-23 NOTE — ED Notes (Signed)
Provider at bedside speaking with family at this time ?

## 2021-06-23 NOTE — ED Notes (Signed)
Pt returned to room at this time. Assisted to use bedpan. NAD, No needs or c/o voiced at this time ?

## 2021-06-24 ENCOUNTER — Encounter: Payer: Self-pay | Admitting: Internal Medicine

## 2021-06-25 ENCOUNTER — Inpatient Hospital Stay
Admission: EM | Admit: 2021-06-25 | Discharge: 2021-07-04 | DRG: 378 | Disposition: A | Discharge status: Home Health | Payer: Medicare PPO | Attending: Internal Medicine | Admitting: Internal Medicine

## 2021-06-25 ENCOUNTER — Emergency Department: Payer: Medicare PPO

## 2021-06-25 DIAGNOSIS — K284 Chronic or unspecified gastrojejunal ulcer with hemorrhage: Secondary | ICD-10-CM | POA: Diagnosis present

## 2021-06-25 DIAGNOSIS — I959 Hypotension, unspecified: Secondary | ICD-10-CM | POA: Diagnosis present

## 2021-06-25 DIAGNOSIS — Z9884 Bariatric surgery status: Secondary | ICD-10-CM

## 2021-06-25 DIAGNOSIS — R531 Weakness: Secondary | ICD-10-CM

## 2021-06-25 DIAGNOSIS — D62 Acute posthemorrhagic anemia: Secondary | ICD-10-CM | POA: Diagnosis present

## 2021-06-25 DIAGNOSIS — I129 Hypertensive chronic kidney disease with stage 1 through stage 4 chronic kidney disease, or unspecified chronic kidney disease: Secondary | ICD-10-CM | POA: Diagnosis present

## 2021-06-25 DIAGNOSIS — I10 Essential (primary) hypertension: Secondary | ICD-10-CM | POA: Diagnosis present

## 2021-06-25 DIAGNOSIS — R296 Repeated falls: Secondary | ICD-10-CM

## 2021-06-25 DIAGNOSIS — W010XXA Fall on same level from slipping, tripping and stumbling without subsequent striking against object, initial encounter: Secondary | ICD-10-CM | POA: Diagnosis present

## 2021-06-25 DIAGNOSIS — N1832 Chronic kidney disease, stage 3b: Secondary | ICD-10-CM | POA: Diagnosis present

## 2021-06-25 DIAGNOSIS — D649 Anemia, unspecified: Secondary | ICD-10-CM

## 2021-06-25 DIAGNOSIS — K5900 Constipation, unspecified: Secondary | ICD-10-CM | POA: Clinically undetermined

## 2021-06-25 DIAGNOSIS — Z8249 Family history of ischemic heart disease and other diseases of the circulatory system: Secondary | ICD-10-CM

## 2021-06-25 DIAGNOSIS — Y92009 Unspecified place in unspecified non-institutional (private) residence as the place of occurrence of the external cause: Secondary | ICD-10-CM

## 2021-06-25 DIAGNOSIS — N390 Urinary tract infection, site not specified: Secondary | ICD-10-CM | POA: Diagnosis present

## 2021-06-25 DIAGNOSIS — S82409A Unspecified fracture of shaft of unspecified fibula, initial encounter for closed fracture: Secondary | ICD-10-CM | POA: Diagnosis present

## 2021-06-25 DIAGNOSIS — R54 Age-related physical debility: Secondary | ICD-10-CM | POA: Diagnosis present

## 2021-06-25 DIAGNOSIS — S82832A Other fracture of upper and lower end of left fibula, initial encounter for closed fracture: Secondary | ICD-10-CM | POA: Diagnosis present

## 2021-06-25 DIAGNOSIS — Z7901 Long term (current) use of anticoagulants: Secondary | ICD-10-CM

## 2021-06-25 DIAGNOSIS — S92335A Nondisplaced fracture of third metatarsal bone, left foot, initial encounter for closed fracture: Principal | ICD-10-CM | POA: Diagnosis present

## 2021-06-25 DIAGNOSIS — Z833 Family history of diabetes mellitus: Secondary | ICD-10-CM

## 2021-06-25 DIAGNOSIS — K250 Acute gastric ulcer with hemorrhage: Secondary | ICD-10-CM | POA: Diagnosis present

## 2021-06-25 DIAGNOSIS — R29818 Other symptoms and signs involving the nervous system: Secondary | ICD-10-CM | POA: Diagnosis present

## 2021-06-25 DIAGNOSIS — I2489 Other forms of acute ischemic heart disease: Secondary | ICD-10-CM | POA: Diagnosis present

## 2021-06-25 DIAGNOSIS — Z86711 Personal history of pulmonary embolism: Secondary | ICD-10-CM | POA: Diagnosis present

## 2021-06-25 DIAGNOSIS — N179 Acute kidney failure, unspecified: Secondary | ICD-10-CM | POA: Diagnosis present

## 2021-06-25 DIAGNOSIS — I248 Other forms of acute ischemic heart disease: Secondary | ICD-10-CM | POA: Diagnosis present

## 2021-06-25 DIAGNOSIS — Z20822 Contact with and (suspected) exposure to covid-19: Secondary | ICD-10-CM | POA: Diagnosis present

## 2021-06-25 DIAGNOSIS — N1831 Chronic kidney disease, stage 3a: Secondary | ICD-10-CM | POA: Diagnosis present

## 2021-06-25 DIAGNOSIS — G629 Polyneuropathy, unspecified: Secondary | ICD-10-CM

## 2021-06-25 DIAGNOSIS — Z96651 Presence of right artificial knee joint: Secondary | ICD-10-CM | POA: Diagnosis present

## 2021-06-25 DIAGNOSIS — I951 Orthostatic hypotension: Secondary | ICD-10-CM | POA: Diagnosis present

## 2021-06-25 LAB — CBC
HCT: 37.9 % (ref 36.0–46.0)
Hemoglobin: 12.6 g/dL (ref 12.0–15.0)
MCH: 32.4 pg (ref 26.0–34.0)
MCHC: 33.2 g/dL (ref 30.0–36.0)
MCV: 97.4 fL (ref 80.0–100.0)
Platelets: 241 10*3/uL (ref 150–400)
RBC: 3.89 MIL/uL (ref 3.87–5.11)
RDW: 14.6 % (ref 11.5–15.5)
WBC: 6.4 10*3/uL (ref 4.0–10.5)
nRBC: 0 % (ref 0.0–0.2)

## 2021-06-25 LAB — COMPREHENSIVE METABOLIC PANEL
ALT: 14 U/L (ref 0–44)
AST: 22 U/L (ref 15–41)
Albumin: 3.4 g/dL — ABNORMAL LOW (ref 3.5–5.0)
Alkaline Phosphatase: 42 U/L (ref 38–126)
Anion gap: 8 (ref 5–15)
BUN: 36 mg/dL — ABNORMAL HIGH (ref 8–23)
CO2: 24 mmol/L (ref 22–32)
Calcium: 9.4 mg/dL (ref 8.9–10.3)
Chloride: 101 mmol/L (ref 98–111)
Creatinine, Ser: 1.75 mg/dL — ABNORMAL HIGH (ref 0.44–1.00)
GFR, Estimated: 29 mL/min — ABNORMAL LOW (ref >60)
Glucose, Bld: 98 mg/dL (ref 70–99)
Potassium: 3.7 mmol/L (ref 3.5–5.1)
Sodium: 133 mmol/L — ABNORMAL LOW (ref 135–145)
Total Bilirubin: 0.4 mg/dL (ref 0.3–1.2)
Total Protein: 6.5 g/dL (ref 6.5–8.1)

## 2021-06-25 LAB — TROPONIN I (HIGH SENSITIVITY): Troponin I (High Sensitivity): 21 ng/L — ABNORMAL HIGH (ref <18)

## 2021-06-25 MED ORDER — ONDANSETRON HCL 4 MG/2ML IJ SOLN
4.0000 mg | Freq: Once | INTRAMUSCULAR | Status: AC
  Administered 2021-06-25: 4 mg via INTRAVENOUS
  Filled 2021-06-25: qty 2

## 2021-06-25 MED ORDER — FENTANYL CITRATE PF 50 MCG/ML IJ SOSY
50.0000 ug | PREFILLED_SYRINGE | Freq: Once | INTRAMUSCULAR | Status: AC
  Administered 2021-06-25: 50 ug via INTRAVENOUS
  Filled 2021-06-25: qty 1

## 2021-06-25 MED ORDER — SODIUM CHLORIDE 0.9 % IV BOLUS
1000.0000 mL | Freq: Once | INTRAVENOUS | Status: AC
Start: 2021-06-25 — End: 2021-06-26
  Administered 2021-06-25: 1000 mL via INTRAVENOUS

## 2021-06-25 NOTE — ED Triage Notes (Signed)
Pt presents to ER via ems from home after tripping while turning and walking out of the bathroom.  Pt reports her feet are normally numb from her neuropathy and has trouble walking at baseline with her walker.  Bilateral feet appear discolored at this time which pt states is normal.  PMS intact to LLE.  Swelling noted to left ankle at this time.  Pt A&O x4.  Pt denies LOC.  Pt denies blood thinners.   ?

## 2021-06-25 NOTE — ED Triage Notes (Signed)
First RN Note: Pt to ED via [facility] with c/o fall, per EMS pt was ambulating out of the bathroom and tripped over a trashcan. Per EMS pt reports feeling a pop to L ankle, per EMS noted swelling and discoloration to L ankle, CMS intact to L foot. SAM splint applied PTA.  ? ? ?124/70 ?68 HR ?99% RA ?

## 2021-06-25 NOTE — ED Provider Notes (Signed)
[facility] Provider Note    Event Date/Time   First MD Initiated Contact with Patient 06/25/21 2302     (approximate)   History   Ankle Pain and Fall   HPI  Ann Russell is a 81 y.o. female with history of hypertension, hyperlipidemia, chronic kidney disease, PE on Eliquis, neuropathy who presents to the emergency department with EMS from home after she states that her feet got tangled up underneath her and she twisted her left ankle and fell.  She states that she did not hit her head.  She denies any other injuries.  She denies any preceding symptoms that led to her fall.  She was just seen here in the emergency department several days ago for a syncopal event but denies that she felt lightheaded, had syncope, chest pain, palpitations or shortness of breath today.  She states she has felt weak.   History provided by patient and EMS.    Past Medical History:  Diagnosis Date   Arthritis    hands   Cancer ([place])    squamous cell mole- back of right arm   Chronic kidney disease    .  Takes Vasotec for kidneys.-stage 3   Depression    Hyperlipemia    Hypertension    h/o had gastric bypass and was able to come off meds   Neuropathy    Pulmonary embolism ([facility])    Sleep apnea    no cpap since her gastric bypass    Past Surgical History:  Procedure Laterality Date   ABDOMINAL HYSTERECTOMY     Buniectomy     bil   CHOLECYSTECTOMY     FACIAL COSMETIC SURGERY  2011   GASTRIC BYPASS  2010   PULMONARY THROMBECTOMY Bilateral 06/22/2017   Procedure: PULMONARY THROMBECTOMY;  Surgeon: Algernon Huxley, MD;  Location: [place] [facility];  Service: Cardiovascular;  Laterality: Bilateral;   Rotor cuff     right   SCLERAL BUCKLE  11/07/2011   Procedure: SCLERAL BUCKLE;  Surgeon: Hayden Pedro, MD;  Location: [place];  Service: Ophthalmology;  Laterality: Left;   TOTAL KNEE ARTHROPLASTY Right 12/03/2018   Procedure: RIGHT TOTAL KNEE ARTHROPLASTY;  Surgeon: Hessie Knows, MD;  Location: [facility];  Service: Orthopedics;  Laterality: Right;    MEDICATIONS:  Prior to Admission medications   Medication Sig Start Date End Date Taking? Authorizing Provider  acetaminophen (TYLENOL) 650 MG CR tablet Take 650 mg by mouth 2 (two) times a day.    [provider]  apixaban (ELIQUIS) 5 MG TABS tablet Take 1 tablet (5 mg total) by mouth 2 (two) times daily. 05/23/21   Jearld Fenton, NP  B COMPLEX VITAMINS SL Place 1 tablet under the tongue daily.    [provider]  bisacodyl (DULCOLAX) 5 MG EC tablet Take 1 tablet (5 mg total) by mouth daily as needed for moderate constipation. 12/06/18   Duanne Guess, PA-C  busPIRone (BUSPAR) 15 MG tablet Take 1 tablet (15 mg total) by mouth 2 (two) times daily. 05/23/21   Jearld Fenton, NP  calcitRIOL (ROCALTROL) 0.25 MCG capsule Take 1 capsule (0.25 mcg total) by mouth daily. 05/23/21   Jearld Fenton, NP  Calcium Carb-Cholecalciferol (CALCIUM 600 + D PO) Take 1 tablet by mouth daily.    [provider]  denosumab (PROLIA) 60 MG/ML SOSY injection Inject 60 mg into the skin every 6 (six) months. 12/14/20   [provider]  ferrous [id number] C-folic acid (TRINSICON / FOLTRIN) capsule Take 1 capsule by mouth 2 (two) times daily. 12/06/18   Duanne Guess, PA-C  FLUoxetine (PROZAC) 20 MG capsule Take 3 capsules (60 mg total) by mouth every morning. 05/23/21   Jearld Fenton, NP  HYDROcodone-acetaminophen (NORCO/VICODIN) 5-325 MG tablet Take 1 tablet by mouth every 8 (eight) hours as needed for moderate pain. 06/23/21   Jearld Fenton, NP  Multiple Vitamin (MULTI-VITAMINS) TABS Take 2 tablets by mouth daily.     [provider]  omeprazole (PRILOSEC) 20 MG capsule Take 1 capsule (20 mg total) by mouth daily. 06/23/21   Jearld Fenton, NP  pregabalin (LYRICA) 100 MG capsule Take 1 capsule (100 mg total) by mouth 3 (three) times daily. 05/23/21 05/23/22  Jearld Fenton, NP   Pyridoxine HCl (VITAMIN B6 PO) Take 1 tablet by mouth daily. With b12    [provider]  valACYclovir (VALTREX) 1000 MG tablet Take 1 tablet (1,000 mg total) by mouth daily as needed (fever blisters). 05/23/21   Jearld Fenton, NP    Physical Exam   Triage Vital Signs: ED Triage Vitals  Enc Vitals Group     BP 06/25/21 2226 (!) 66/53     Pulse Rate 06/25/21 2223 79     Resp 06/25/21 2223 20     Temp 06/25/21 2247 97.8 F (36.6 C)     Temp Source 06/25/21 2247 Oral     SpO2 06/25/21 2223 96 %     Weight 06/25/21 2224 150 lb (68 kg)     Height 06/25/21 2224 '5\' 8"'$  (1.727 m)     Head Circumference --      Peak Flow --      Pain Score 06/25/21 2224 10     Pain Loc --      Pain Edu? --      Excl. in [place]? --     Most recent vital signs: Vitals:   06/25/21 2247 06/25/21 2335  BP:  129/71  Pulse:  (!) 55  Resp:  20  Temp: 97.8 F (36.6 C) 97.8 F (36.6 C)  SpO2:  100%     CONSTITUTIONAL: Alert and oriented and responds appropriately to questions. Well-appearing; well-nourished; GCS 10, elderly  HEAD: Normocephalic; atraumatic EYES: Conjunctivae clear, PERRL, EOMI ENT: normal nose; no rhinorrhea; moist mucous membranes; pharynx without lesions noted; no dental injury; no septal hematoma, no epistaxis; no facial deformity or bony tenderness NECK: Supple, no midline spinal tenderness, step-off or deformity; trachea midline CARD: RRR; S1 and S2 appreciated; no murmurs, no clicks, no rubs, no gallops RESP: Normal chest excursion without splinting or tachypnea; breath sounds clear and equal bilaterally; no wheezes, no rhonchi, no rales; no hypoxia or respiratory distress CHEST:  chest wall stable, no crepitus or ecchymosis or deformity, nontender to palpation; no flail chest ABD/GI: Normal bowel sounds; non-distended; soft, non-tender, no rebound, no guarding; no ecchymosis or other lesions noted PELVIS:  stable, nontender to palpation BACK:  The back appears normal; no  midline spinal tenderness, step-off or deformity EXT: Patient is tender to palpation over the left proximal fibula, diffusely over the left ankle with soft tissue swelling as well as over the dorsal left foot.  Compartments of the left leg are soft.  2+ DP pulses bilaterally.  No calf tenderness or calf swelling.  She does have some bruising to her upper extremities bilaterally that she states is from recent IVs being placed but has no tenderness  to the upper extremities.  No joint effusions noted. SKIN: Normal color for age and race; warm NEURO: No facial asymmetry, normal speech, moving all extremities equally  ED Results / Procedures / Treatments   LABS: (all labs ordered are listed, but only abnormal results are displayed) Labs Reviewed  COMPREHENSIVE METABOLIC PANEL - Abnormal; Notable for the following components:      Result Value   Sodium 133 (*)    BUN 36 (*)    Creatinine, Ser 1.75 (*)    Albumin 3.4 (*)    GFR, Estimated 29 (*)    All other components within normal limits  URINALYSIS, COMPLETE (UACMP) WITH MICROSCOPIC - Abnormal; Notable for the following components:   Color, Urine YELLOW (*)    APPearance HAZY (*)    Leukocytes,Ua LARGE (*)    WBC, UA >50 (*)    Bacteria, UA RARE (*)    All other components within normal limits  TROPONIN I (HIGH SENSITIVITY) - Abnormal; Notable for the following components:   Troponin I (High Sensitivity) 21 (*)    All other components within normal limits  URINE CULTURE  RESP PANEL BY RT-PCR (FLU A&B, COVID) ARPGX2  CBC     EKG:  EKG Interpretation  Date/Time:  Saturday June 25 2021 22:37:01 EST Ventricular Rate:  66 PR Interval:  202 QRS Duration: 105 QT Interval:  409 QTC Calculation: 429 R Axis:   21 Text Interpretation: Sinus rhythm Confirmed by Pryor Curia [phone]) on 06/25/2021 11:03:15 PM          RADIOLOGY: My personal review and interpretation of imaging: X-rays show fracture of the distal fibula and third  metatarsal that are nondisplaced.  I have personally reviewed all radiology reports. DG Tibia/Fibula Left  Result Date: 06/25/2021 CLINICAL DATA:  Recent fall with left leg pain, initial encounter EXAM: LEFT TIBIA AND FIBULA - 2 VIEW COMPARISON:  None. FINDINGS: Mild degenerative changes of the knee joint are seen. Lateral soft tissue swelling is again seen distally. Lucency is noted in the distal aspect of the fibula consistent with undisplaced fracture better visualized than on recent ankle films. No other focal abnormality is noted. IMPRESSION: Distal fibular fracture without significant displacement better visualized than on recent ankle films. Electronically Signed   By: Inez Catalina M.D.   On: 06/25/2021 23:45   DG Ankle Complete Left  Result Date: 06/25/2021 CLINICAL DATA:  Recent fall with ankle pain, initial encounter EXAM: LEFT ANKLE COMPLETE - 3+ VIEW COMPARISON:  None. FINDINGS: Soft tissue swelling is noted laterally. The distal tibia and fibula show no acute fracture. Tarsal degenerative changes are seen. Postsurgical changes in the first metatarsal are noted. No acute fracture or dislocation is noted. IMPRESSION: Soft tissue changes without acute abnormality. Electronically Signed   By: Inez Catalina M.D.   On: 06/25/2021 22:50   DG Foot Complete Left  Result Date: 06/25/2021 CLINICAL DATA:  Recent fall with ankle and foot pain, initial encounter EXAM: LEFT FOOT - COMPLETE 3+ VIEW COMPARISON:  None. FINDINGS: Postsurgical changes in the first metatarsal are noted. Hallux valgus deformity seen. Tarsal degenerative changes are noted. Undisplaced fracture at the base of the third metatarsal is noted. Mild soft tissue swelling is noted about the ankle. IMPRESSION: Third metatarsal fracture proximally. Chronic changes about the tarsal bones. Electronically Signed   By: Inez Catalina M.D.   On: 06/25/2021 23:42     PROCEDURES:  Critical Care performed: No   CRITICAL CARE Performed by:  Cyril Mourning Aleksey Newbern  Total critical care time: 0 minutes  Critical care time was exclusive of separately billable procedures and treating other patients.  Critical care was necessary to treat or prevent imminent or life-threatening deterioration.  Critical care was time spent personally by me on the following activities: development of treatment plan with patient and/or surrogate as well as nursing, discussions with consultants, evaluation of patient's response to treatment, examination of patient, obtaining history from patient or surrogate, ordering and performing treatments and interventions, ordering and review of laboratory studies, ordering and review of radiographic studies, pulse oximetry and re-evaluation of patient's condition.   Marland Kitchen1-3 Lead EKG Interpretation Performed by: Tavionna Grout, Delice Bison, DO Authorized by: Jelissa Espiritu, Delice Bison, DO     Interpretation: normal     ECG rate:  65   ECG rate assessment: normal     Rhythm: sinus rhythm     Ectopy: none     Conduction: normal      IMPRESSION / MDM / ASSESSMENT AND PLAN / ED COURSE  I reviewed the triage vital signs and the nursing notes.  Patient here with left leg pain after mechanical fall.  The patient is on the cardiac monitor to evaluate for evidence of arrhythmia and/or significant heart rate changes.   DIFFERENTIAL DIAGNOSIS (includes but not limited to):   Ankle sprain, fracture, dislocation, she denies any preceding symptoms that led to her fall so doubt arrhythmia, ACS, PE, dissection, anemia, electrolyte derangement, dehydration, UTI.  Recently seen twice for syncope but denies a syncopal event today.  Is on Eliquis but adamantly denies any head injury.   PLAN: We will obtain x-rays of the left lower extremity and give pain medication.  CBC, BMP, urinalysis, EKG have already been ordered from triage.   MEDICATIONS GIVEN IN ED: Medications  cefTRIAXone (ROCEPHIN) 1 g in sodium chloride 0.9 % 100 mL IVPB (has no  administration in time range)  sodium chloride 0.9 % bolus 1,000 mL (0 mLs Intravenous Stopped 06/26/21 0256)  fentaNYL (SUBLIMAZE) injection 50 mcg (50 mcg Intravenous Given 06/25/21 2337)  ondansetron (ZOFRAN) injection 4 mg (4 mg Intravenous Given 06/25/21 2337)     ED COURSE: Patient's labs show normal hemoglobin, normal white blood cell count.  Chronic kidney disease which appears stable.  Normal electrolytes.  Troponin is 21 which is down from where she has been previously.  EKG today is nonischemic.  X-rays read by myself and radiologist and show a third nondisplaced metatarsal fracture and a nondisplaced distal fibular fracture.  We will place her in a posterior splint.  Discussed with her that these will likely not need surgical management given they are nondisplaced but will need her to be nonweightbearing.  She uses a walker at baseline.    Patient has been placed into a posterior splint.  She does have discoloration of her foot which she reports is chronic but I am able to palpate a 2+ DP pulse.  She states her pain is well controlled.  Patient's urine does appear infected.  We will add on a urine culture and give Rocephin.  I suspect this may be causing her weakness and possible recent falls.  I have offered admission and she agrees.  Updated patient's daughter as well.  Will discuss with the hospitalist.   CONSULTS:  Consulted and discussed patient's case with hospitalist, Dr. Damita Dunnings.  I have recommended admission and consulting physician agrees and will place admission orders.  Patient (and family if present) agree with this plan.   I reviewed all  nursing notes, vitals, pertinent previous records.  All labs, EKGs, imaging ordered have been independently reviewed and interpreted by myself.    OUTSIDE RECORDS REVIEWED: Reviewed patient's last admission in April 2020.         FINAL CLINICAL IMPRESSION(S) / ED DIAGNOSES   Final diagnoses:  Nondisplaced fracture of third metatarsal  bone, left foot, initial encounter for closed fracture  Nondisplaced fracture of distal end of left fibula  Acute UTI  Generalized weakness     Rx / DC Orders   ED Discharge Orders     None        Note:  This document was prepared using Dragon voice recognition software and may include unintentional dictation errors.   Reyann Troop, Delice Bison, DO 06/26/21 [phone]

## 2021-06-26 ENCOUNTER — Other Ambulatory Visit: Payer: Self-pay

## 2021-06-26 DIAGNOSIS — S82409A Unspecified fracture of shaft of unspecified fibula, initial encounter for closed fracture: Secondary | ICD-10-CM | POA: Diagnosis present

## 2021-06-26 DIAGNOSIS — N390 Urinary tract infection, site not specified: Secondary | ICD-10-CM | POA: Diagnosis present

## 2021-06-26 DIAGNOSIS — I959 Hypotension, unspecified: Secondary | ICD-10-CM | POA: Diagnosis present

## 2021-06-26 DIAGNOSIS — N1832 Chronic kidney disease, stage 3b: Secondary | ICD-10-CM | POA: Diagnosis present

## 2021-06-26 DIAGNOSIS — N1831 Chronic kidney disease, stage 3a: Secondary | ICD-10-CM | POA: Diagnosis present

## 2021-06-26 DIAGNOSIS — R296 Repeated falls: Secondary | ICD-10-CM

## 2021-06-26 LAB — RESP PANEL BY RT-PCR (FLU A&B, COVID) ARPGX2
Influenza A by PCR: NEGATIVE
Influenza B by PCR: NEGATIVE
SARS Coronavirus 2 by RT PCR: NEGATIVE

## 2021-06-26 LAB — URINALYSIS, COMPLETE (UACMP) WITH MICROSCOPIC
Bilirubin Urine: NEGATIVE
Glucose, UA: NEGATIVE mg/dL
Hgb urine dipstick: NEGATIVE
Ketones, ur: NEGATIVE mg/dL
Nitrite: NEGATIVE
Protein, ur: NEGATIVE mg/dL
Specific Gravity, Urine: 1.008 (ref 1.005–1.030)
WBC, UA: >50 WBC/hpf — ABNORMAL HIGH (ref 0–5)
pH: 6 (ref 5.0–8.0)

## 2021-06-26 MED ORDER — ONDANSETRON HCL 4 MG PO TABS
4.0000 mg | ORAL_TABLET | Freq: Four times a day (QID) | ORAL | Status: DC | PRN
  Administered 2021-06-26: 13:00:00 4 mg via ORAL
  Filled 2021-06-26: qty 1

## 2021-06-26 MED ORDER — APIXABAN 5 MG PO TABS
5.0000 mg | ORAL_TABLET | Freq: Two times a day (BID) | ORAL | Status: DC
  Administered 2021-06-26 – 2021-06-28 (×5): 5 mg via ORAL
  Filled 2021-06-26 (×5): qty 1

## 2021-06-26 MED ORDER — CALCITRIOL 0.25 MCG PO CAPS
0.2500 ug | ORAL_CAPSULE | Freq: Every day | ORAL | Status: DC
  Administered 2021-06-26 – 2021-07-04 (×8): 0.25 ug via ORAL
  Filled 2021-06-26 (×9): qty 1

## 2021-06-26 MED ORDER — SODIUM CHLORIDE 0.9 % IV SOLN
1.0000 g | INTRAVENOUS | Status: AC
  Administered 2021-06-27 – 2021-06-28 (×2): 1 g via INTRAVENOUS
  Filled 2021-06-26 (×2): qty 1

## 2021-06-26 MED ORDER — FLUOXETINE HCL 20 MG PO CAPS
60.0000 mg | ORAL_CAPSULE | ORAL | Status: DC
Start: 2021-06-26 — End: 2021-07-04
  Administered 2021-06-26 – 2021-07-04 (×8): 60 mg via ORAL
  Filled 2021-06-26 (×8): qty 3

## 2021-06-26 MED ORDER — LACTATED RINGERS IV SOLN: INTRAVENOUS | Status: DC

## 2021-06-26 MED ORDER — PREGABALIN 50 MG PO CAPS
100.0000 mg | ORAL_CAPSULE | Freq: Three times a day (TID) | ORAL | Status: DC
  Administered 2021-06-26 – 2021-07-04 (×23): 100 mg via ORAL
  Filled 2021-06-26: qty 1
  Filled 2021-06-26 (×5): qty 2
  Filled 2021-06-26: qty 1
  Filled 2021-06-26 (×2): qty 2
  Filled 2021-06-26: qty 1
  Filled 2021-06-26 (×2): qty 2
  Filled 2021-06-26: qty 1
  Filled 2021-06-26: qty 2
  Filled 2021-06-26: qty 1
  Filled 2021-06-26 (×4): qty 2
  Filled 2021-06-26: qty 1
  Filled 2021-06-26 (×3): qty 2

## 2021-06-26 MED ORDER — ACETAMINOPHEN 650 MG RE SUPP: 650.0000 mg | Freq: Four times a day (QID) | RECTAL | Status: DC | PRN

## 2021-06-26 MED ORDER — ONDANSETRON HCL 4 MG/2ML IJ SOLN
4.0000 mg | Freq: Four times a day (QID) | INTRAMUSCULAR | Status: DC | PRN
  Administered 2021-06-29: 12:00:00 4 mg via INTRAVENOUS

## 2021-06-26 MED ORDER — ACETAMINOPHEN 325 MG PO TABS
650.0000 mg | ORAL_TABLET | Freq: Four times a day (QID) | ORAL | Status: DC | PRN
Start: 2021-06-26 — End: 2021-07-04
  Administered 2021-06-28: 11:00:00 650 mg via ORAL
  Filled 2021-06-26: qty 2

## 2021-06-26 MED ORDER — SODIUM CHLORIDE 0.9 % IV SOLN: INTRAVENOUS | Status: DC

## 2021-06-26 MED ORDER — HYDROCODONE-ACETAMINOPHEN 5-325 MG PO TABS
1.0000 | ORAL_TABLET | ORAL | Status: DC | PRN
  Administered 2021-06-26: 2 via ORAL
  Administered 2021-06-26: 13:00:00 1 via ORAL
  Administered 2021-06-27 – 2021-07-01 (×6): 2 via ORAL
  Administered 2021-07-02: 1 via ORAL
  Administered 2021-07-03: 2 via ORAL
  Filled 2021-06-26 (×2): qty 2
  Filled 2021-06-26: qty 1
  Filled 2021-06-26: qty 2
  Filled 2021-06-26: qty 1
  Filled 2021-06-26 (×5): qty 2

## 2021-06-26 MED ORDER — BUSPIRONE HCL 10 MG PO TABS
15.0000 mg | ORAL_TABLET | Freq: Two times a day (BID) | ORAL | Status: DC
  Administered 2021-06-26 – 2021-07-04 (×16): 15 mg via ORAL
  Filled 2021-06-26 (×3): qty 2
  Filled 2021-06-26: qty 3
  Filled 2021-06-26 (×12): qty 2

## 2021-06-26 MED ORDER — CEFTRIAXONE SODIUM 1 G IJ SOLR
1.0000 g | Freq: Once | INTRAMUSCULAR | Status: AC
  Administered 2021-06-26: 1 g via INTRAVENOUS
  Filled 2021-06-26: qty 10

## 2021-06-26 NOTE — Assessment & Plan Note (Addendum)
Uses assistive devices, wheelchair, scooter, walker.  Given her now almost nonambulatory status and the fact that she is no longer on blood thinning medication (at least for the immediate future if not permanently), patient is at high risk for a blood clot.  Status post IVC filter placed 3/10. ?

## 2021-06-26 NOTE — ED Notes (Signed)
Request made for transport to the floor ?

## 2021-06-26 NOTE — Evaluation (Signed)
Occupational Therapy Evaluation Patient Details Name: Ann Russell MRN: [id number] DOB: 06/14/1940 Today's Date: 06/26/2021   History of Present Illness Pt admitted to [facility] on 06/25/21 under observation for mechanical fall that resulted in L distal fibula and 3rd met nondisplaced fx. Pt seen twice in the past week in the ED for syncope without fall, but denies dizziness/lightheadedness with this fall. Significant PMH includes: HTN, CKD 3B, PE on Eliquis, neuropathy   Clinical Impression   Pt seen for OT evaluation this date. Pt agreeable to OT eval despite feeling "woozy" at start of session (BP 95/56, MAP 69). Prior to admission, pt was independent in all ADLs and mod-I with 4WW for functional mobility, and was living in a 1-story home with boyfriend. This date, pt rolled in bed MOD-I and performed supine>sit transfer (sitting via long-sitting position in bed) with MIN GUARD. Once sitting upright, pt c/o worsening lightheadedness and appeared to have near syncopal event (BP: 90/43, MAP 59), and ultimately was unable to take part in seated ADLs. Once pt returned to supine position, BP returned to initial resting pressure (BP: 93/56, MAP 68) and pt was left supine in bed, in no acute distress, and with all needs within reach; RN informed. Pt currently presents with decreased balance, decreased activity tolerance, and LLE pain and would benefit from additional skilled OT services to maximize return to PLOF and minimize risk of future falls, injury, caregiver burden, and readmission. Upon discharge, recommend SNF.    Recommendations for follow up therapy are one component of a multi-disciplinary discharge planning process, led by the attending physician.  Recommendations may be updated based on patient status, additional functional criteria and insurance authorization.   Follow Up Recommendations  Skilled nursing-short term rehab (<3 hours/day)    Assistance Recommended at Discharge Frequent or constant  Supervision/Assistance  Patient can return home with the following A lot of help with walking and/or transfers;A lot of help with bathing/dressing/bathroom;Help with stairs or ramp for entrance    Functional Status Assessment  Patient has had a recent decline in their functional status and demonstrates the ability to make significant improvements in function in a reasonable and predictable amount of time.  Equipment Recommendations  Other (comment) (defer to next venue of care)       Precautions / Restrictions Precautions Precautions: Fall Restrictions Weight Bearing Restrictions: Yes LLE Weight Bearing: Partial weight bearing LLE Partial Weight Bearing Percentage or Pounds: 25-50% while wearing CAM boot (per secure message with podiatry), otherwise NWB      Mobility Bed Mobility Overal bed mobility: Needs Assistance Bed Mobility: Supine to Sit, Sit to Supine, Rolling Rolling: Modified independent (Device/Increase time)   Supine to sit: Min guard Sit to supine: Mod assist   General bed mobility comments: Pt performed supine>long sitting position in bed requiring only MIN GUARD d/t lightheadededness. Pt required increased assist for sit>supine transfer due to c/o worsening lightheadedness and near syncopal event    Transfers                   General transfer comment: unable to progress secondary to decreased upright tolerance and orthostatic hypotension      Balance Overall balance assessment: Needs assistance Sitting-balance support: Feet unsupported, Bilateral upper extremity supported Sitting balance-Leahy Scale: Poor Sitting balance - Comments: increased assist with seated balance due to c/o severe lightheadedness  ADL either performed or assessed with clinical judgement   ADL Overall ADL's : Needs assistance/impaired                                       General ADL Comments: Pt unable to  tolerate sitting EOB d/t worsening "wooziness" however anticipate able to complete UB ADLs with SUPERVISION while seated EOB     Vision Baseline Vision/History: 1 Wears glasses Ability to See in Adequate Light: 0 Adequate Patient Visual Report: No change from baseline              Pertinent Vitals/Pain Pain Assessment Pain Assessment: No/denies pain        Extremity/Trunk Assessment Upper Extremity Assessment Upper Extremity Assessment: Generalized weakness   Lower Extremity Assessment Lower Extremity Assessment: Generalized weakness;LLE deficits/detail LLE: Unable to fully assess due to immobilization LLE Sensation: history of peripheral neuropathy       Communication Communication Communication: No difficulties   Cognition Arousal/Alertness: Awake/alert Behavior During Therapy: WFL for tasks assessed/performed Overall Cognitive Status: Within Functional Limits for tasks assessed                                 General Comments: Requires increased processing time d/t feelings of "wozziness" throughout sesison     General Comments  Pt (+) for orthostatic hypotension following supine>sit transfer; supine BP: 95/56 (MAP 69), sitting (via long-sitting position in bed) BP: 90/43 (59), and supine BP: 93/56 (68). RN informed            Home Living Family/patient expects to be discharged to:: Private residence Living Arrangements: Non-relatives/Friends;Spouse/significant other Available Help at Discharge: Family;Friend(s);Available PRN/intermittently (states that 80yo boyfriend is no longer able to assist her; has a daughter nearby that works full-time as a Pharmacist, hospital and can provide PRN assist) Type of Home: House Home Access: [place]: One level     Bathroom Shower/Tub: Occupational psychologist: Handicapped height [place] Accessibility: Yes   Home Equipment: [place] (4 wheels);Cane - single point;BSC/3in1;Shower  seat;Grab bars - tub/shower;Electric scooter          Prior Functioning/Environment Prior Level of Function : History of Falls (last six months)             Mobility Comments: Mod I with limited community ambulation with rollator (~50% of the time). Hx of multiple falls, with most recent resulting in LLE fx. ADLs Comments: Mod I with ADL's and medication management. Boyfriend assists with transportation and other IADL's.        OT Problem List: Decreased strength;Decreased range of motion;Impaired balance (sitting and/or standing);Decreased activity tolerance;Decreased knowledge of precautions;Decreased knowledge of use of DME or AE;Pain      OT Treatment/Interventions: Self-care/ADL training;Therapeutic exercise;Energy conservation;DME and/or AE instruction;Therapeutic activities;Patient/family education;Balance training    OT Goals(Current goals can be found in the care plan section) Acute Rehab OT Goals Patient Stated Goal: to feel less woozy OT Goal Formulation: With patient Time For Goal Achievement: 07/10/21 Potential to Achieve Goals: Good ADL Goals Pt Will Perform Grooming: with min assist;standing Pt Will Perform Upper Body Dressing: with set-up;with supervision;sitting Pt Will Transfer to Toilet: with min assist;stand pivot transfer;bedside commode  OT Frequency: Min 2X/week       AM-PAC OT "6 Clicks" Daily Activity     Outcome Measure  Help from another person eating meals?: None Help from another person taking care of personal grooming?: A Little Help from another person toileting, which includes using toliet, bedpan, or urinal?: A Lot Help from another person bathing (including washing, rinsing, drying)?: A Lot Help from another person to put on and taking off regular upper body clothing?: A Little Help from another person to put on and taking off regular lower body clothing?: A Lot 6 Click Score: 16   End of Session Nurse Communication: Mobility status;Other  (comment) (vitals)  Activity Tolerance: Treatment limited secondary to medical complications (Comment) (limited by orthostatic hypotension) Patient left: in bed;with call bell/phone within reach  OT Visit Diagnosis: Unsteadiness on feet (R26.81);Repeated falls (R29.6);Muscle weakness (generalized) (M62.81);Pain Pain - Right/Left: Left Pain - part of body: Leg                Time: 1513-1530 OT Time Calculation (min): 17 min Charges:  OT General Charges $OT Visit: 1 Visit OT Evaluation $OT Eval Moderate Complexity: [place], OTR/L [place]

## 2021-06-26 NOTE — ED Notes (Signed)
Unable to do orthostatic at this time, pt gets very dizzy.  ?

## 2021-06-26 NOTE — Evaluation (Signed)
Physical Therapy Evaluation Patient Details Name: Ann Russell MRN: [id number] DOB: June 01, 1940 Today's Date: 06/26/2021  History of Present Illness  Pt admitted to [facility] on 06/25/21 under observation for mechanical fall that resulted in L distal fibula and 3rd met nondisplaced fx. Per hospitalist note, pt to be LLE NWB in posterior splint. Pt seen twice in the past week in the ED for syncope without fall, but denies dizziness/lightheadedness with this fall. Significant PMH includes: HTN, CKD 3B, PE on Eliquis, neuropathy   Clinical Impression  Pt is a 81 year old F admitted to hospital on 06/25/21 for UTI. At baseline, pt is mod I for ADL's, limited community ambulation (with rollator 50% of the time), medication management, and simple IADL's; requires assist for IADL's and transportation. Pt presents with generalized weakness, impaired skin integrity, decreased upright tolerance, orthostatic hypotension, decreased balance, and decreased knowledge of precautions, resulting in impaired functional mobility from baseline. Due to deficits, pt required mod I for rolling in bed for toileting, and mod-max assist for supine<>sit transfer. Further mobility limited secondary to c/o severe lightheadedness/dizziness while seated EOB with drop in BP from 114/70 mmHg to 78/58 mmHg, resulting in near syncopal event. Pt with hx of recent admissions for LOC/syncopal events which could be due to hypotension and decreased upright tolerance; current experience may be worsened due to loss of blood from bleeding IV site as pt is chronically on anticoagulants, RN aware of site and it was addressed at end of PT session. Deficits limit the pt's ability to safely and independently perform ADL's, transfer, and ambulate. Pt will benefit from acute skilled PT services to address deficits for return to baseline function.  At this time, PT recommends SNF at DC to address deficits and improve overall safety with functional mobility prior to  return home.   PT to assess further mobility at next session, as appropriate. Pt expected to make improvements with overall mobility with normalized BP, however, will likely need post acute PT services due to new LLE NWB precautions.        Recommendations for follow up therapy are one component of a multi-disciplinary discharge planning process, led by the attending physician.  Recommendations may be updated based on patient status, additional functional criteria and insurance authorization.  Follow Up Recommendations Skilled nursing-short term rehab (<3 hours/day)    Assistance Recommended at Discharge Intermittent Supervision/Assistance  Patient can return home with the following  A lot of help with walking and/or transfers;A lot of help with bathing/dressing/bathroom;Assistance with cooking/housework;Help with stairs or ramp for entrance    Equipment Recommendations  (will continue to assess)  Recommendations for Other Services  OT consult    Functional Status Assessment Patient has had a recent decline in their functional status and demonstrates the ability to make significant improvements in function in a reasonable and predictable amount of time.     Precautions / Restrictions Precautions Precautions: Fall Restrictions Weight Bearing Restrictions: Yes LLE Weight Bearing: Non weight bearing      Mobility  Bed Mobility Overal bed mobility: Needs Assistance Bed Mobility: Supine to Sit, Sit to Supine, Rolling Rolling: Modified independent (Device/Increase time)   Supine to sit: Mod assist Sit to supine: Max assist   General bed mobility comments: increased assist for sit>supine transfer due to c/o severe lightheadedness and near syncopal event    Transfers                   General transfer comment: unable to progress secondary to  decreased upright tolerance and orthostatic hypotension        Balance Overall balance assessment: Needs  assistance Sitting-balance support: Feet unsupported, Bilateral upper extremity supported Sitting balance-Leahy Scale: Poor Sitting balance - Comments: increased assist with seated balance due to c/o severe lightheadedness                                     Pertinent Vitals/Pain Pain Assessment Pain Assessment: No/denies pain (c/o "wooziness" and lightheadedness)    Home Living Family/patient expects to be discharged to:: Private residence Living Arrangements: Non-relatives/Friends;Spouse/significant other Available Help at Discharge: Family;Friend(s);Available PRN/intermittently (states that 80yo boyfriend is no longer able to assist her; has a daughter nearby that works full-time as a Pharmacist, hospital and can provide PRN assist) Type of Home: [place] Access: [place]: One [place]: [place] (4 wheels);Cane - single point;BSC/3in1;Shower seat;Grab bars - tub/shower;Electric scooter      Prior Function Prior Level of Function : History of Falls (last six months)             Mobility Comments: Mod I with limited community ambulation with rollator (~50% of the time). Hx of multiple falls, with most recent resulting in LLE fx. ADLs Comments: Mod I with ADL's, medication management, and simple IADL's. Boyfriend assists with transportation and IADL's.     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 Orthostatic with supine>sit transfer (supine: 114/70 mmHg, seated: 75/85 mmHg with c/o severe dizziness and near syncopal event)    Exercises Other Exercises Other Exercises: Bed mobility; unable to tolerate transfers due to decreased upright tolerance and orthostatic hypotension. Educated re: PT role/POC, DC recommendations, orthostatic hypotension, LLE NWB/precautions, gait training, safety with mobility. She verbalized understanding.   Assessment/Plan    PT Assessment Patient needs continued PT services  PT Problem List Decreased strength;Decreased range of motion;Decreased activity tolerance;Decreased balance;Decreased mobility;Decreased skin integrity;Cardiopulmonary status limiting activity       PT Treatment Interventions DME instruction;Gait training;Functional mobility training;Therapeutic activities;Therapeutic exercise;Balance training;Neuromuscular re-education;Patient/family education    PT Goals (Current goals can be found in the Care Plan section)  Acute Rehab PT Goals Patient Stated Goal: "get better" PT Goal Formulation: With patient Time For Goal Achievement: 07/10/21 Potential to Achieve Goals: Good Additional Goals Additional Goal #1: Pt will be supervision to navigate manual WC 60f, to improve overall safety with functional mobility and decrease caregiver burden at DC.    Frequency 7X/week        AM-PAC PT "6 Clicks" Mobility  Outcome  Measure Help needed turning from your back to your side while in a flat bed without using bedrails?: None Help needed moving from lying on your back to sitting on the side of a flat bed without using bedrails?: A Lot Help needed moving to and from a bed to a chair (including a wheelchair)?: Total Help needed  standing up from a chair using your arms (e.g., wheelchair or bedside chair)?: Total Help needed to walk in hospital room?: Total Help needed climbing 3-5 steps with a railing? : Total 6 Click Score: 10    End of Session   Activity Tolerance: Treatment limited secondary to medical complications (Comment) Patient left: in bed;with call bell/phone within reach;with nursing/sitter in room Nurse Communication: Mobility status PT Visit Diagnosis: Unsteadiness on feet (R26.81);Muscle weakness (generalized) (M62.81);History of falling (Z91.81);Difficulty in walking, not elsewhere classified (R26.2)    Time: 0258-5277 PT Time Calculation (min) (ACUTE ONLY): 32 min   Charges:   PT Evaluation $PT Eval Moderate Complexity: 1 Mod PT Treatments $Therapeutic Activity: 8-22 mins        Herminio Commons, PT, DPT 9:59 AM,06/26/21

## 2021-06-26 NOTE — Assessment & Plan Note (Addendum)
-   We have to hold eliquis for now. Note normal RV size/function on echo.  Status post IVC filter ?

## 2021-06-26 NOTE — Assessment & Plan Note (Addendum)
Presented with AKI with creatinine 1.75, now back down to baseline.  Creatinine on day of discharge at 1.14. ?

## 2021-06-26 NOTE — Assessment & Plan Note (Addendum)
On the left. Nondisplaced. Closed. Also with nondisplaced proximal 3rd metatarsal fracture.  ?- Discussed with Dr. Vickki Muff, podiatry, who confirms nonoperative management plan. Recommends trial of partial weight bearing (25-50%) LLE with CAM boot which has been applied. No need for splint. ?Physical therapy to see above they will likely recommended short-term skilled nursing.  Patient lives at home with her boyfriend.  She tells me however that she does not want to go to skilled nursing and she also tells me that even prior to this fall she is only minimally ambulated due to bad peripheral neuropathy.  She would like to go home with home health physical therapy and I suspect that she will become almost fully nonambulatory which she understands and he is okay with. ?

## 2021-06-26 NOTE — ED Notes (Signed)
Patient's IV bleeding from loose port connection. Patient's bedding and clothes changed.  ?

## 2021-06-26 NOTE — H&P (Addendum)
History and Physical    Patient: Ann Russell DPO:[id number] DOB: 05-01-1940 DOA: 06/25/2021 DOS: the patient was seen and examined on 06/26/2021 PCP: Jearld Fenton, NP  Patient coming from: Home  Chief Complaint:  Chief Complaint  Patient presents with   Ankle Pain   Fall    HPI: Ann Russell is a 80 y.o. female with medical history significant for HTN, CKD 3B, PE on Eliquis, neuropathy who at baseline ambulates with a walker and also has a wheelchair and a scooter who presented to the emergency room for evaluation following an accidental fall in the bathroom.  She was not using her assistive devices and said she tripped and fell with immediate pain in her left ankle.  Patient was seen twice in the past week in the emergency room for syncope without fall.  She did report some lower abdominal cramping at the time.  Prior to the fall tonight she denied any complaints.  Denied feeling lightheaded.  Had no nausea, vomiting, abdominal pain ED course: BP 66/53 with otherwise normal vitals Blood work: WBC 6400 and hemoglobin 12.6.  Creatinine 1.75 which is at baseline, otherwise unremarkable.  Urinalysis with large leukocyte esterase.  Troponin 21 EKG, personally viewed and interpreted: Sinus at 66 with no acute ST-T wave changes Imaging of left ankle and foot shows distal fibula fracture and third metatarsal fracture proximally. Patient was casted in the ED.  She was given an IV fluid bolus and started on ceftriaxone Hospitalist admission requested due to concern for high risk of fall.   Review of Systems: As mentioned in the history of present illness. All other systems reviewed and are negative. 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  Takes Vasotec for kidneys.-stage 3   Depression    Hyperlipemia    Hypertension    h/o had gastric bypass and was able to come off meds    Neuropathy    Pulmonary embolism ([facility])    Sleep apnea    no cpap since her gastric bypass   Past Surgical History:  Procedure Laterality Date   ABDOMINAL HYSTERECTOMY     Buniectomy     bil   CHOLECYSTECTOMY     FACIAL COSMETIC SURGERY  2011   GASTRIC BYPASS  2010   PULMONARY THROMBECTOMY Bilateral 06/22/2017   Procedure: PULMONARY THROMBECTOMY;  Surgeon: Algernon Huxley, MD;  Location: [facility];  Service: Cardiovascular;  Laterality: Bilateral;   Rotor cuff     right   SCLERAL BUCKLE  11/07/2011   Procedure: SCLERAL BUCKLE;  Surgeon: Hayden Pedro, MD;  Location: [facility];  Service: Ophthalmology;  Laterality: Left;   TOTAL KNEE ARTHROPLASTY Right 12/03/2018   Procedure: RIGHT TOTAL KNEE ARTHROPLASTY;  Surgeon: Hessie Knows, MD;  Location: [facility];  Service: Orthopedics;  Laterality: Right;   Social History:  reports that she has never smoked. She has never used smokeless tobacco. She reports that she does not drink alcohol and does not use drugs.  No Known Allergies  Family History  Problem Relation Age of Onset   Heart disease Mother    Dementia Mother    Neuropathy Father    Diabetes Brother    Breast cancer Neg Hx     Prior to Admission medications   Medication Sig Start Date End Date Taking? Authorizing  Provider  acetaminophen (TYLENOL) 650 MG CR tablet Take 650 mg by mouth 2 (two) times a day.    [provider]  apixaban (ELIQUIS) 5 MG TABS tablet Take 1 tablet (5 mg total) by mouth 2 (two) times daily. 05/23/21   Jearld Fenton, NP  B COMPLEX VITAMINS SL Place 1 tablet under the tongue daily.    [provider]  bisacodyl (DULCOLAX) 5 MG EC tablet Take 1 tablet (5 mg total) by mouth daily as needed for moderate constipation. 12/06/18   Duanne Guess, PA-C  busPIRone (BUSPAR) 15 MG tablet Take 1 tablet (15 mg total) by mouth 2 (two) times daily. 05/23/21   Jearld Fenton, NP  calcitRIOL (ROCALTROL) 0.25 MCG capsule Take 1 capsule (0.25 mcg  total) by mouth daily. 05/23/21   Jearld Fenton, NP  Calcium Carb-Cholecalciferol (CALCIUM 600 + D PO) Take 1 tablet by mouth daily.    [provider]  denosumab (PROLIA) 60 MG/ML SOSY injection Inject 60 mg into the skin every 6 (six) months. 12/14/20   [provider]  ferrous [id number] C-folic acid (TRINSICON / FOLTRIN) capsule Take 1 capsule by mouth 2 (two) times daily. 12/06/18   Duanne Guess, PA-C  FLUoxetine (PROZAC) 20 MG capsule Take 3 capsules (60 mg total) by mouth every morning. 05/23/21   Jearld Fenton, NP  HYDROcodone-acetaminophen (NORCO/VICODIN) 5-325 MG tablet Take 1 tablet by mouth every 8 (eight) hours as needed for moderate pain. 06/23/21   Jearld Fenton, NP  Multiple Vitamin (MULTI-VITAMINS) TABS Take 2 tablets by mouth daily.     [provider]  omeprazole (PRILOSEC) 20 MG capsule Take 1 capsule (20 mg total) by mouth daily. 06/23/21   Jearld Fenton, NP  pregabalin (LYRICA) 100 MG capsule Take 1 capsule (100 mg total) by mouth 3 (three) times daily. 05/23/21 05/23/22  Jearld Fenton, NP  Pyridoxine HCl (VITAMIN B6 PO) Take 1 tablet by mouth daily. With b12    [provider]  valACYclovir (VALTREX) 1000 MG tablet Take 1 tablet (1,000 mg total) by mouth daily as needed (fever blisters). 05/23/21   Jearld Fenton, NP    Physical Exam: Vitals:   06/25/21 2239 06/25/21 2247 06/25/21 2335 06/26/21 0416  BP: 94/68  129/71 (!) 149/77  Pulse:   (!) 55 (!) 54  Resp:   20 20  Temp:  97.8 F (36.6 C) 97.8 F (36.6 C) 97.8 F (36.6 C)  TempSrc:  Oral Oral Oral  SpO2:   100% 100%  Weight:      Height:       Physical Exam Vitals and nursing note reviewed.  Constitutional:      General: She is not in acute distress.    Appearance: Normal appearance.  HENT:     Head: Normocephalic and atraumatic.  Cardiovascular:     Rate and Rhythm: Normal rate and regular rhythm.     Pulses: Normal pulses.     Heart sounds: Normal  heart sounds. No murmur heard. Pulmonary:     Effort: Pulmonary effort is normal.     Breath sounds: Normal breath sounds. No wheezing or rhonchi.  Abdominal:     General: Bowel sounds are normal.     Palpations: Abdomen is soft.     Tenderness: There is no abdominal tenderness.  Musculoskeletal:        General: No swelling or tenderness. Normal range of motion.     Cervical back: Normal range  of motion and neck supple.     Comments: Left leg in cast  Skin:    General: Skin is warm and dry.  Neurological:     General: No focal deficit present.     Mental Status: She is alert. Mental status is at baseline.  Psychiatric:        Mood and Affect: Mood normal.        Behavior: Behavior normal.     Data Reviewed: Relevant notes from primary care and specialist visits, past discharge summaries as available in EHR, including Care Everywhere. Prior diagnostic testing as pertinent to current admission diagnoses Updated medications and problem lists for reconciliation ED course, including vitals, labs, imaging, treatment and response to treatment Triage notes, nursing and pharmacy notes and ED provider's notes Notable results as noted in HPI   Assessment and Plan: * UTI (urinary tract infection) Rocephin Follow cultures  Hypotension BP with systolic in the 16X on arrival Hold home antihypertensives Possible etiology to prior syncopal events and falls IV hydration  Closed fibular fracture Patient in hard cast.  Consider podiatry consult  Frequent falls Likely multifactorial related to hypotension, neuropathy, physical deconditioning, UTI Treat hypotension and UTI PT eval  Stage 3b chronic kidney disease ([place]) Renal function at baseline  Neuropathy Uses assistive devices, wheelchair, scooter, walker  History of pulmonary embolism Continue Eliquis  Hypertension Hold antihypertensives       Advance Care Planning:   Code Status: Prior   Consults: none  Family  Communication: none  Severity of Illness: The appropriate patient status for this patient is OBSERVATION. Observation status is judged to be reasonable and necessary in order to provide the required intensity of service to ensure the patient's safety. The patient's presenting symptoms, physical exam findings, and initial radiographic and laboratory data in the context of their medical condition is felt to place them at decreased risk for further clinical deterioration. Furthermore, it is anticipated that the patient will be medically stable for discharge from the hospital within 2 midnights of admission.   Author: Athena Masse, MD 06/26/2021 4:28 AM  For on call review [email or web address].

## 2021-06-26 NOTE — Progress Notes (Addendum)
PROGRESS NOTE ? ?Brief Narrative: ?Ann Russell is a 81 y.o. 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 Main complaints was left ankle pain with inability to bear weight. Imaging confirmed nondisplaced, closed fractures of the distal left fibula and proximal left 3rd metatarsal. Urinalysis is strongly suggestive of infection, and the patient does report some lower abdominal discomfort, urinary urgency. Posterior splint was applied. Ceftriaxone was started and the patient was admitted early this morning by Dr. Damita Dunnings. ? ?Subjective: ?Pain is controlled in left LE, though toes feel ice cold to her (they are warm to touch on exam). This is a chronic issue. No fever, chills. She does have intermittent and current urinary urgency. Lives with her boyfriend and, despite my suspicion that her NWB status in addition to her previous level of functioning (requiring walker) make discharge to rehabilitation facility most likely her safest option, states she will be discharging home from here. LAter work with PT and it didn't go well. ? ?Objective: ?BP 108/64   Pulse 71   Temp 97.8 ?F (36.6 ?C) (Oral)   Resp 11   Ht '5\' 8"'$  (1.727 m)   Wt 68 kg   SpO2 98%   BMI 22.81 kg/m?   ?Gen: Pleasant, elderly female in no distress ?Pulm: Clear and nonlabored  ?CV: RRR, no murmur, no JVD, no edema ?GI: Soft, NT, ND, +BS  ?Neuro: Alert and oriented. No focal deficits. ?Skin: No new rashes, lesions or ulcers, scattered ecchymoses on arms bilaterally. Bilateral foot discoloration which is chronic. ?Ext: LLE with posterior splint, TTP over lateral malleolus. Toes are warm with palpable DP pulse and intact cap refill. ? ?Assessment & Plan: ?Fall at home: Pt reports mechanical etiology, no LOC.  ?- Pt reports she will not go to rehab, has a boyfriend who can help her. She  is not currently able to be safely discharged to that environment based on PT evaluation this morning. We will give IVF since her BP is soft and Cr mildly elevated from baseline. Repeat assessment with orthostatic vital signs later. ? ?Nondisplaced fractures of left fibula and proximal 3rd metatarsal:  ?- Discussed with Dr. Vickki Muff, podiatry, who confirms nonoperative management plan. Recommends trial of partial weight bearing LLE with CAM boot instead of posterior splint. Follow up in clinic 1-2 weeks. ?- Pain control. ? ?Orthostatic hypotension:  ?- IVF, recheck in the PM. ? ?UTI:  ?- Continue ceftriaxone pending urine culture data ? ?Patrecia Pour, MD ?Pager on amion ?06/26/2021, 10:53 AM   ?

## 2021-06-26 NOTE — Assessment & Plan Note (Addendum)
Likely multifactorial related to hypotension, neuropathy, physical deconditioning, UTI.  We will continue treating orthostatic hypotension.  Refuses skilled nursing, see below.  PT and OT have seen patient.  Still having some episodes of dizziness which will need to get better. ?

## 2021-06-26 NOTE — ED Notes (Addendum)
CAM walker applied on pts LEFT ankle, daughter Seward Meth) and granddaughter at bedside. Pt requesting pain meds, Lynn Ito RN made aware. Warm blankets given per request, no other needs/concerns voiced at this time. ?

## 2021-06-26 NOTE — ED Notes (Signed)
Charge nurse notified re: need for a cam walker. Per charge, ortho tech should come and do it. Secretary will call.  ?

## 2021-06-26 NOTE — Assessment & Plan Note (Addendum)
Urine culture without growth. Completed 3 doses of ceftriaxone ?

## 2021-06-26 NOTE — Assessment & Plan Note (Addendum)
Currently having severe orthostatic hypotension. Not on anti-HTN medications PTA. ?

## 2021-06-26 NOTE — Assessment & Plan Note (Addendum)
BP with systolic in the 04U on arrival. Also several recent ED trips for syncope PTA. Persistent orthostasis here despite adequate IV fluid resuscitation. Echo performed today shows preserved LVEF 55-60%, G1DD associated with mild LVH, small circumferential pericardial effusion without mention of diastolic collapse/tamponade.  ?- Started TED hose, midodrine.  Bleeding may have played a partial role in this.  Low-dose midodrine added. ?

## 2021-06-27 DIAGNOSIS — S82832A Other fracture of upper and lower end of left fibula, initial encounter for closed fracture: Secondary | ICD-10-CM

## 2021-06-27 DIAGNOSIS — R296 Repeated falls: Secondary | ICD-10-CM

## 2021-06-27 DIAGNOSIS — I10 Essential (primary) hypertension: Secondary | ICD-10-CM

## 2021-06-27 DIAGNOSIS — G629 Polyneuropathy, unspecified: Secondary | ICD-10-CM

## 2021-06-27 DIAGNOSIS — K250 Acute gastric ulcer with hemorrhage: Secondary | ICD-10-CM | POA: Diagnosis present

## 2021-06-27 DIAGNOSIS — N1832 Chronic kidney disease, stage 3b: Secondary | ICD-10-CM

## 2021-06-27 DIAGNOSIS — I248 Other forms of acute ischemic heart disease: Secondary | ICD-10-CM | POA: Diagnosis not present

## 2021-06-27 DIAGNOSIS — I951 Orthostatic hypotension: Secondary | ICD-10-CM | POA: Diagnosis present

## 2021-06-27 DIAGNOSIS — I959 Hypotension, unspecified: Secondary | ICD-10-CM

## 2021-06-27 DIAGNOSIS — N390 Urinary tract infection, site not specified: Secondary | ICD-10-CM | POA: Diagnosis present

## 2021-06-27 DIAGNOSIS — Z86711 Personal history of pulmonary embolism: Secondary | ICD-10-CM

## 2021-06-27 LAB — BASIC METABOLIC PANEL
Anion gap: 8 (ref 5–15)
BUN: 75 mg/dL — ABNORMAL HIGH (ref 8–23)
CO2: 23 mmol/L (ref 22–32)
Calcium: 8.2 mg/dL — ABNORMAL LOW (ref 8.9–10.3)
Chloride: 110 mmol/L (ref 98–111)
Creatinine, Ser: 1.42 mg/dL — ABNORMAL HIGH (ref 0.44–1.00)
GFR, Estimated: 38 mL/min — ABNORMAL LOW (ref >60)
Glucose, Bld: 86 mg/dL (ref 70–99)
Potassium: 3.9 mmol/L (ref 3.5–5.1)
Sodium: 141 mmol/L (ref 135–145)

## 2021-06-27 LAB — URINE CULTURE: Culture: NO GROWTH

## 2021-06-27 MED ORDER — MIDODRINE HCL 5 MG PO TABS
5.0000 mg | ORAL_TABLET | Freq: Three times a day (TID) | ORAL | Status: DC
  Administered 2021-06-27 – 2021-07-03 (×17): 5 mg via ORAL
  Filled 2021-06-27 (×20): qty 1

## 2021-06-27 NOTE — Care Management Obs Status (Signed)
MEDICARE OBSERVATION STATUS NOTIFICATION ? ? ?Patient Details  ?Name: Ann Russell ?MRN: [id number] ?Date of Birth: Feb 14, 1941 ? ? ?Medicare Observation Status Notification Given:  Yes ? ? ? ?Candie Chroman, LCSW ?06/27/2021, 11:54 AM ?

## 2021-06-27 NOTE — TOC Initial Note (Signed)
Transition of Care (TOC) - Initial/Assessment Note  ? ? ?Patient Details  ?Name: Ann Russell ?MRN: [id number] ?Date of Birth: [birth year] ? ?Transition of Care (TOC) CM/SW Contact:    ?Candie Chroman, LCSW ?Phone Number: ?06/27/2021, 12:12 PM ? ?Clinical Narrative:  CSW met with patient. No supports at bedside. CSW introduced role and explained that therapy recommendations would be discussed. Patient declined SNF due to past bad experience but is agreeable to home health. First preference is [facility] but they do not accept [employer]. Made referral to [facility] for PT and OT. She has an elevated toilet seat, shower chair, knee scooter, rollator, and transport chair at home. She lives with her significant other and gave CSW permission to call him. He is in agreement with home health but said if she falls he will not be able to pick her up as he is 80 years old. He can pick her up at discharge but will need assistance from patient's daughter/son-in-law to wheel her up the ramp and into the house. No further concerns. CSW encouraged patient and her significant other to contact CSW as needed. CSW will continue to follow patient and significant other for support and facilitate return home when stable.               ? ?Expected Discharge Plan: [place] ?Barriers to Discharge: Continued Medical Work up ? ? ?Patient Goals and CMS Choice ?  ?  ?  ? ?Expected Discharge Plan and Services ?Expected Discharge Plan: [place] ?  ?  ?Post Acute Care Choice: Home Health ?Living arrangements for the past 2 months: [place] ?Expected Discharge Date: 06/26/21               ?  ?  ?  ?  ?  ?HH Arranged: PT, OT ?[place] Agency: [facility] ?Date HH Agency Contacted: 06/27/21 ?  ?Representative spoke with at [place]: Juanda Crumble ? ?Prior Living Arrangements/Services ?Living arrangements for the past 2 months: [place] ?Lives with:: Significant Other ?Patient language and need  for interpreter reviewed:: Yes ?Do you feel safe going back to the place where you live?: Yes      ?Need for Family Participation in Patient Care: Yes (Comment) ?Care giver support system in place?: Yes (comment) ?Current home services: DME ?Criminal Activity/Legal Involvement Pertinent to Current Situation/Hospitalization: No - Comment as needed ? ?Activities of Daily Living ?Home Assistive Devices/Equipment: Chana Bode (specify type) ?ADL Screening (condition at time of admission) ?Patient's cognitive ability adequate to safely complete daily activities?: Yes ?Is the patient deaf or have difficulty hearing?: No ?Does the patient have difficulty seeing, even when wearing glasses/contacts?: No ?Does the patient have difficulty concentrating, remembering, or making decisions?: Yes ?Patient able to express need for assistance with ADLs?: Yes ?Does the patient have difficulty dressing or bathing?: Yes ?Independently performs ADLs?: No ?Communication: Independent ?Dressing (OT): Needs assistance ?Is this a change from baseline?: Pre-admission baseline ?Grooming: Needs assistance ?Is this a change from baseline?: Pre-admission baseline ?Feeding: Independent ?Bathing: Needs assistance ?Is this a change from baseline?: Pre-admission baseline ?Toileting: Needs assistance ?Is this a change from baseline?: Pre-admission baseline ?In/Out Bed: Needs assistance ?Is this a change from baseline?: Pre-admission baseline ?Walks in Home: Independent with device (comment) (walker) ?Does the patient have difficulty walking or climbing stairs?: Yes ?Weakness of Legs: Both ?Weakness of Arms/Hands: Both ? ?Permission Sought/Granted ?Permission sought to share information with : Customer service manager, Family  Supports ?Permission granted to share information with : Yes, Verbal Permission Granted ? Share Information with NAME: Mateo Flow ? Permission granted to share info w AGENCY: [place] [place] ? Permission  granted to share info w Relationship: Significant other ? Permission granted to share info w Contact Information: [phone] ? ?Emotional Assessment ?Appearance:: Appears stated age ?Attitude/Demeanor/Rapport: Engaged, Gracious ?Affect (typically observed): Appropriate, Calm, Pleasant ?Orientation: : Oriented to Self, Oriented to Place, Oriented to  Time, Oriented to Situation ?Alcohol / Substance Use: Not Applicable ?Psych Involvement: No (comment) ? ?Admission diagnosis:  UTI (urinary tract infection) [N39.0] ?Acute UTI [N39.0] ?Generalized weakness [R53.1] ?Nondisplaced fracture of distal end of left fibula [S82.832A] ?Nondisplaced fracture of third metatarsal bone, left foot, initial encounter for closed fracture [S92.335A] ?Patient Active Problem List  ? Diagnosis Date Noted  ? UTI (urinary tract infection) 06/26/2021  ? Frequent falls 06/26/2021  ? Stage 3b chronic kidney disease ([place]) 06/26/2021  ? Closed fibular fracture 06/26/2021  ? Hypotension 06/26/2021  ? Osteoarthritis 02/03/2021  ? Aortic atherosclerosis ([place]) 02/03/2021  ? Osteoporosis 02/03/2021  ? Overweight with body mass index (BMI) of 26 to 26.9 in adult 02/03/2021  ? Neuropathy   ? Anxiety and depression 02/10/2020  ? Chronic kidney disease 02/10/2020  ? 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 Sleep apnea 02/10/2020  ? History of pulmonary embolism 11/05/2019  ? Hypercalcemia 03/05/2019  ? Hypertension 07/24/2017  ? 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 Anemia 12/18/2013  ? ?PCP:  Jearld Fenton, NP ?Pharmacy:   ?[facility] [place] [facility] ?[place] [address] ?Phone: [phone] Fax: [phone] ? ? ? ? ?Social Determinants of Health (SDOH) Interventions ?  ? ?Readmission Risk Interventions ?No flowsheet data found. ? ? ?

## 2021-06-27 NOTE — Plan of Care (Signed)

## 2021-06-27 NOTE — Assessment & Plan Note (Addendum)
Mild troponin elevation is down-trending without anginal symptoms or ischemic ECG changes. No definite RWMA on suboptimal echo.  ?- No further intervention currently planned. ?

## 2021-06-27 NOTE — Progress Notes (Signed)
Progress Note  Patient: Ann Russell VWU:[id number] DOB: 08/16/1940  DOA: 06/25/2021  DOS: 06/27/2021    Brief hospital course: KALEA PERINE is a 81 y.o. 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 Main complaints was left ankle pain with inability to bear weight. Imaging confirmed nondisplaced, closed fractures of the distal left fibula and proximal left 3rd metatarsal. Urinalysis is strongly suggestive of infection, and the patient does report some lower abdominal discomfort, urinary urgency. Posterior splint was applied. Ceftriaxone was started and the patient was admitted, confirmed to have severe orthostatic hypotension. She is maintained in CAM boot per podiatry recommendations, is undergoing work up and management for orthostatic hypotension.  Assessment and Plan: * UTI (urinary tract infection) Urine culture without growth.  - Complete 3rd and final dose of empiric ceftriaxone tomorrow.  Hypotension BP with systolic in the 29F on arrival. Remains severely orthostatic limiting any even bed level mobility.  - Still orthostatic today after IVF x~24hrs. Increase IVF rate, check echo, start low dose midodrine. Place TED hose.   Closed fibular fracture On the left. Nondisplaced. Closed. Also with nondisplaced proximal 3rd metatarsal fracture.  - Discussed with Dr. Vickki Muff, podiatry, who confirms nonoperative management plan. Recommends trial of partial weight bearing (25-50%) LLE with CAM boot which has been applied.  - Follow up in clinic 1-2 weeks. - Pain control adequate.  Frequent falls Likely multifactorial related to hypotension, neuropathy, physical deconditioning, UTI - Continue treating orthostatic hypotension.  - This patient is at exceedingly high risk of falling without adequate assistance. CSW consulted.  Despite pt's reservations, I strongly recommend she entertain the idea of SNF for acute rehab at discharge.  Demand ischemia (HCC) Mild troponin elevation is down-trending without anginal symptoms or ischemic ECG changes.  - Check echo as above. No further intervention currently planned.  Stage 3b chronic kidney disease ([place]) Technically presented with mild AKI with creatinine 1.75, now down >0.30 to 1.42 which puts CrCl back into baseline range 30-68m/min.  - Avoid nephrotoxins.  Neuropathy Uses assistive devices, wheelchair, scooter, walker  History of pulmonary embolism - Continue eliquis  Hypertension Currently having severe orthostatic hypotension. Not on anti-HTN medications PTA.  Subjective: Feels better, but is finding herself very weak, even difficult to lift her arms. Very woozy when sitting from laying position.   Objective: Vitals:   06/26/21 2010 06/26/21 2055 06/27/21 0336 06/27/21 0810  BP: 117/68  113/61 125/69  Pulse: 76  73 67  Resp: '16  16 18  '$ Temp: 99 F (37.2 C)  99 F (37.2 C) 98.1 F (36.7 C)  TempSrc: Oral     SpO2: 100%  100% 99%  Weight:  74.6 kg    Height:  '5\' 8"'$  (1.727 m)     Gen: Pleasant, frail 81y.o. female in no distress Pulm: Nonlabored breathing room air. Clear. CV: Regular rate and rhythm. No murmur, rub, or gallop. No JVD, no pitting dependent edema. GI: Abdomen soft, non-tender, non-distended, with normoactive bowel sounds.  Ext: Warm, no deformities. CAM boot on LLE with sensation intact to ankles bilaterally which is baseline. Skin: No new rashes, lesions or ulcers on visualized skin. Neuro: Alert and oriented. No new focal neurological deficits. Psych: Judgement and insight appear fair. Mood euthymic & affect congruent. Behavior is appropriate.    Data Personally  reviewed: CBC: Recent Labs  Lab 06/20/21 2151 06/23/21 1714 06/25/21 2240  WBC 6.7 6.8 6.4  NEUTROABS 3.1 4.0  --   HGB 13.5 13.4 12.6  HCT 41.2 41.1 37.9  MCV  99.8 100.5* 97.4  PLT 220 234 161   Basic Metabolic Panel: Recent Labs  Lab 06/20/21 2151 06/23/21 1550 06/23/21 1845 06/25/21 2240 06/27/21 0428  NA 138 138 136 133* 141  K 4.1 4.1 4.3 3.7 3.9  CL 106 105 104 101 110  CO2 '24 24 23 24 23  '$ GLUCOSE 118* 104* 90 98 86  BUN 54* 37* 26* 36* 75*  CREATININE 1.57* 1.58* 1.30* 1.75* 1.42*  CALCIUM 10.2 10.5* 9.3 9.4 8.2*   GFR: Estimated Creatinine Clearance: 32.4 mL/min (A) (by C-G formula based on SCr of 1.42 mg/dL (H)). Liver Function Tests: Recent Labs  Lab 06/23/21 1550 06/23/21 1845 06/25/21 2240  AST '19 28 22  '$ ALT '12 11 14  '$ ALKPHOS  --  37* 42  BILITOT 0.4 1.0 0.4  PROT 6.1 5.3* 6.5  ALBUMIN  --  2.8* 3.4*   Recent Labs  Lab 06/23/21 1845  LIPASE 52*   No results for input(s): AMMONIA in the last 168 hours. Coagulation Profile: Recent Labs  Lab 06/23/21 1714  INR 1.4*   Cardiac Enzymes: No results for input(s): CKTOTAL, CKMB, CKMBINDEX, TROPONINI in the last 168 hours. BNP (last 3 results) No results for input(s): PROBNP in the last 8760 hours. HbA1C: No results for input(s): HGBA1C in the last 72 hours. CBG: No results for input(s): GLUCAP in the last 168 hours. Lipid Profile: No results for input(s): CHOL, HDL, LDLCALC, TRIG, CHOLHDL, LDLDIRECT in the last 72 hours. Thyroid Function Tests: No results for input(s): TSH, T4TOTAL, FREET4, T3FREE, THYROIDAB in the last 72 hours. Anemia Panel: No results for input(s): VITAMINB12, FOLATE, FERRITIN, TIBC, IRON, RETICCTPCT in the last 72 hours. Urine analysis:    Component Value Date/Time   COLORURINE YELLOW (A) 06/26/2021 0257   APPEARANCEUR HAZY (A) 06/26/2021 0257   APPEARANCEUR CLOUDY 05/13/2011 1033   LABSPEC 1.008 06/26/2021 0257   LABSPEC SEE COMMENT 05/13/2011 1033   PHURINE 6.0 06/26/2021 0257   GLUCOSEU NEGATIVE 06/26/2021 0257   GLUCOSEU see comment 05/13/2011 1033   HGBUR NEGATIVE 06/26/2021 0257   BILIRUBINUR NEGATIVE 06/26/2021 0257    BILIRUBINUR SEE COMMENT 05/13/2011 1033   KETONESUR NEGATIVE 06/26/2021 0257   PROTEINUR NEGATIVE 06/26/2021 0257   NITRITE NEGATIVE 06/26/2021 0257   LEUKOCYTESUR LARGE (A) 06/26/2021 0257   LEUKOCYTESUR SEE COMMENT 05/13/2011 1033   Recent Results (from the past 240 hour(s))  Resp Panel by RT-PCR (Flu A&B, Covid) Nasopharyngeal Swab     Status: None   Collection Time: 06/23/21  5:14 PM   Specimen: Nasopharyngeal Swab; Nasopharyngeal(NP) swabs in vial transport medium  Result Value Ref Range Status   SARS Coronavirus 2 by RT PCR NEGATIVE NEGATIVE Final    Comment: (NOTE) SARS-CoV-2 target nucleic acids are NOT DETECTED.  The SARS-CoV-2 RNA is generally detectable in upper respiratory specimens during the acute phase of infection. The lowest concentration of SARS-CoV-2 viral copies this assay can detect is 138 copies/mL. A negative result does not preclude SARS-Cov-2 infection and should not be used as the sole basis for treatment or other patient management decisions. A negative result may occur with  improper specimen collection/handling, submission of specimen other than nasopharyngeal swab, presence of viral mutation(s) within the areas targeted by this assay, and inadequate number of viral copies(<138 copies/mL). A  negative result must be combined with clinical observations, patient history, and epidemiological information. The expected result is Negative.  Fact Sheet for Patients:  EntrepreneurPulse.com.au  Fact Sheet for Healthcare Providers:  IncredibleEmployment.be  This test is no t yet approved or cleared by the [place] FDA and  has been authorized for detection and/or diagnosis of SARS-CoV-2 by FDA under an Emergency Use Authorization (EUA). This EUA will remain  in effect (meaning this test can be used) for the duration of the COVID-19 declaration under Section 564(b)(1) of the Act, 21 U.S.C.section 360bbb-3(b)(1), unless  the authorization is terminated  or revoked sooner.       Influenza A by PCR NEGATIVE NEGATIVE Final   Influenza B by PCR NEGATIVE NEGATIVE Final    Comment: (NOTE) The Xpert Xpress SARS-CoV-2/FLU/RSV plus assay is intended as an aid in the diagnosis of influenza from Nasopharyngeal swab specimens and should not be used as a sole basis for treatment. Nasal washings and aspirates are unacceptable for Xpert Xpress SARS-CoV-2/FLU/RSV testing.  Fact Sheet for Patients: EntrepreneurPulse.com.au  Fact Sheet for Healthcare Providers: IncredibleEmployment.be  This test is not yet approved or cleared by the [place] FDA and has been authorized for detection and/or diagnosis of SARS-CoV-2 by FDA under an Emergency Use Authorization (EUA). This EUA will remain in effect (meaning this test can be used) for the duration of the COVID-19 declaration under Section 564(b)(1) of the Act, 21 U.S.C. section 360bbb-3(b)(1), unless the authorization is terminated or revoked.  Performed at [facility], [address] [place], [place] [address]   Urine Culture     Status: None   Collection Time: 06/26/21  2:57 AM   Specimen: Urine, Clean Catch  Result Value Ref Range Status   Specimen Description   Final    URINE, CLEAN CATCH Performed at [facility], [address], [place], [place] [address]    Special Requests   Final    NONE Performed at [facility], [address] [place], [place] [address]    Culture   Final    NO GROWTH Performed at [facility], [address], [place],  [address]    Report Status 06/27/2021 FINAL  Final  Resp Panel by RT-PCR (Flu A&B, Covid) Nasopharyngeal Swab     Status: None   Collection Time: 06/26/21  4:18 AM   Specimen: Nasopharyngeal Swab; Nasopharyngeal(NP) swabs in vial transport medium  Result Value Ref Range Status   SARS Coronavirus 2 by RT PCR NEGATIVE NEGATIVE  Final    Comment: (NOTE) SARS-CoV-2 target nucleic acids are NOT DETECTED.  The SARS-CoV-2 RNA is generally detectable in upper respiratory specimens during the acute phase of infection. The lowest concentration of SARS-CoV-2 viral copies this assay can detect is 138 copies/mL. A negative result does not preclude SARS-Cov-2 infection and should not be used as the sole basis for treatment or other patient management decisions. A negative result may occur with  improper specimen collection/handling, submission of specimen other than nasopharyngeal swab, presence of viral mutation(s) within the areas targeted by this assay, and inadequate number of viral copies(<138 copies/mL). A negative result must be combined with clinical observations, patient history, and epidemiological information. The expected result is Negative.  Fact Sheet for Patients:  EntrepreneurPulse.com.au  Fact Sheet for Healthcare Providers:  IncredibleEmployment.be  This test is no t yet approved or cleared by the [place] FDA and  has been authorized for detection and/or diagnosis of SARS-CoV-2 by FDA under  an Emergency Use Authorization (EUA). This EUA will remain  in effect (meaning this test can be used) for the duration of the COVID-19 declaration under Section 564(b)(1) of the Act, 21 U.S.C.section 360bbb-3(b)(1), unless the authorization is terminated  or revoked sooner.       Influenza A by PCR NEGATIVE NEGATIVE Final   Influenza B by PCR NEGATIVE NEGATIVE Final    Comment: (NOTE) The Xpert Xpress SARS-CoV-2/FLU/RSV plus assay is intended as an aid in the diagnosis of influenza from Nasopharyngeal swab specimens and should not be used as a sole basis for treatment. Nasal washings and aspirates are unacceptable for Xpert Xpress SARS-CoV-2/FLU/RSV testing.  Fact Sheet for Patients: EntrepreneurPulse.com.au  Fact Sheet for Healthcare  Providers: IncredibleEmployment.be  This test is not yet approved or cleared by the [place] FDA and has been authorized for detection and/or diagnosis of SARS-CoV-2 by FDA under an Emergency Use Authorization (EUA). This EUA will remain in effect (meaning this test can be used) for the duration of the COVID-19 declaration under Section 564(b)(1) of the Act, 21 U.S.C. section 360bbb-3(b)(1), unless the authorization is terminated or revoked.  Performed at [facility], [address] [place], [place] [address]      DG Tibia/Fibula Left  Result Date: 06/25/2021 CLINICAL DATA:  Recent fall with left leg pain, initial encounter EXAM: LEFT TIBIA AND FIBULA - 2 VIEW COMPARISON:  None. FINDINGS: Mild degenerative changes of the knee joint are seen. Lateral soft tissue swelling is again seen distally. Lucency is noted in the distal aspect of the fibula consistent with undisplaced fracture better visualized than on recent ankle films. No other focal abnormality is noted. IMPRESSION: Distal fibular fracture without significant displacement better visualized than on recent ankle films. Electronically Signed   By: Inez Catalina M.D.   On: 06/25/2021 23:45   DG Ankle Complete Left  Result Date: 06/25/2021 CLINICAL DATA:  Recent fall with ankle pain, initial encounter EXAM: LEFT ANKLE COMPLETE - 3+ VIEW COMPARISON:  None. FINDINGS: Soft tissue swelling is noted laterally. The distal tibia and fibula show no acute fracture. Tarsal degenerative changes are seen. Postsurgical changes in the first metatarsal are noted. No acute fracture or dislocation is noted. IMPRESSION: Soft tissue changes without acute abnormality. Electronically Signed   By: Inez Catalina M.D.   On: 06/25/2021 22:50   DG Foot Complete Left  Result Date: 06/25/2021 CLINICAL DATA:  Recent fall with ankle and foot pain, initial encounter EXAM: LEFT FOOT - COMPLETE 3+ VIEW COMPARISON:  None. FINDINGS:  Postsurgical changes in the first metatarsal are noted. Hallux valgus deformity seen. Tarsal degenerative changes are noted. Undisplaced fracture at the base of the third metatarsal is noted. Mild soft tissue swelling is noted about the ankle. IMPRESSION: Third metatarsal fracture proximally. Chronic changes about the tarsal bones. Electronically Signed   By: Inez Catalina M.D.   On: 06/25/2021 23:42    SARS-CoV-2 PCR: Negative Influenza A/B: Negative  Family Communication: None at bedside.   Disposition: Status is: Inpatient Remains inpatient appropriate because: Still severely orthostatic and unsafe to attempt home discharge. Giving IVF, checking echo, starting midodrine. Planned Discharge Destination:  Favor SNF > home if pt agreeable.  Patrecia Pour, MD 06/27/2021 12:42 PM Page by [email or web address]

## 2021-06-27 NOTE — Progress Notes (Signed)
Physical Therapy Treatment ?Patient Details ?Name: Ann Russell ?MRN: [id number] ?DOB: 07/29/1940 ?Today's Date: 06/27/2021 ? ? ?History of Present Illness Ann Russell is a 80yoF admitted to [facility] on 06/25/21 under observation for mechanical fall that resulted in L distal fibula and 3rd met nondisplaced fx. Pt seen twice in the past week in the ED for syncope without fall, but denies dizziness/lightheadedness with this fall. Significant PMH includes: HTN, CKD 3B, PE on Eliquis, neuropathy ? ?  ?PT Comments  ? ? Pt still quite drowsy, similar to presentation in morning. Pt seems generally incoherent at times, more focused on wanted to remain in bed due to being cold or generally not feeling good. Pt needs extensive cues to comes to EOB, first coming into long sitting, then only rotating trunk 20-30 degrees each cue, never fulling bringing Rt foot to floor as cued, although she believes her foot is touching floor. Pt remarks heavily on past negative experience in a SNF setting and strong desire to avoid placement this admission, however she is unable to demonstrate adequate problem solving this date as to how her ADL needs will be met in her current state. Author removed CAM rocker as is not compatible with current splint in place. Will reach out to podiatry for clarification. BP remains orthostatic, today with increased nausea and dizziness while upright.  ? ?Orthostatic VS for the past 24 hrs: ? BP- Lying Pulse- Lying BP- Sitting Pulse- Sitting  ?06/27/21 1135 131/71 63 (!) 88/60 91  ? ? ? ? ?   ?Recommendations for follow up therapy are one component of a multi-disciplinary discharge planning process, led by the attending physician.  Recommendations may be updated based on patient status, additional functional criteria and insurance authorization. ? ?Follow Up Recommendations ? Skilled nursing-short term rehab (<3 hours/day) ?  ?  ?Assistance Recommended at Discharge Intermittent Supervision/Assistance  ?Patient can  return home with the following A lot of help with walking and/or transfers;A lot of help with bathing/dressing/bathroom;Assistance with cooking/housework;Help with stairs or ramp for entrance ?  ?Equipment Recommendations ?  (defer to facility)  ?  ?Recommendations for Other Services OT consult ? ? ?  ?Precautions / Restrictions Precautions ?Precautions: Fall ?Restrictions ?LLE Weight Bearing: Partial weight bearing ?LLE Partial Weight Bearing Percentage or Pounds: 25-50%  ?  ? ?Mobility ? Bed Mobility ?Overal bed mobility: Needs Assistance ?Bed Mobility: Supine to Sit, Sit to Supine ?  ?  ?Supine to sit: Min guard ?Sit to supine: Min assist ?  ?General bed mobility comments: lots of encouragement adn cuing required, pt somewhat disoriented, confused, poor awareness. nauseated at EOB ?  ? ?Transfers ?  ?  ?  ?  ?  ?  ?  ?  ?  ?  ?  ? ?Ambulation/Gait ?  ?  ?  ?  ?  ?  ?  ?  ? ? ?Stairs ?  ?  ?  ?  ?  ? ? ?Wheelchair Mobility ?  ? ?Modified Rankin (Stroke Patients Only) ?  ? ? ?  ?Balance   ?  ?  ?  ?  ?  ?  ?  ?  ?  ?  ?  ?  ?  ?  ?  ?  ?  ?  ?  ? ?  ?Cognition   ?  ?  ?  ?  ?  ?  ?  ?  ?  ?  ?  ?  ?  ?  ?  ?  ?  ?  ?  ?  ?  ? ?  ?  Exercises   ? ?  ?General Comments   ?  ?  ? ?Pertinent Vitals/Pain    ? ? ?Home Living   ?  ?  ?  ?  ?  ?  ?  ?  ?  ?   ?  ?Prior Function    ?  ?  ?   ? ?PT Goals (current goals can now be found in the care plan section) Acute Rehab PT Goals ?Patient Stated Goal: "get better" ?PT Goal Formulation: With patient ?Time For Goal Achievement: 07/10/21 ?Potential to Achieve Goals: Good ?Additional Goals ?Additional Goal #1: Pt will be supervision to navigate manual WC 30ft, to improve overall safety with functional mobility and decrease caregiver burden at DC. ?Progress towards PT goals: Progressing toward goals ? ?  ?Frequency ? ? ? 7X/week ? ? ? ?  ?PT Plan Current plan remains appropriate  ? ? ?Co-evaluation   ?  ?  ?  ?  ? ?  ?AM-PAC PT "6 Clicks" Mobility   ?Outcome Measure ? Help needed  turning from your back to your side while in a flat bed without using bedrails?: A Lot ?Help needed moving from lying on your back to sitting on the side of a flat bed without using bedrails?: A Lot ?Help needed moving to and from a bed to a chair (including a wheelchair)?: A Lot ?Help needed standing up from a chair using your arms (e.g., wheelchair or bedside chair)?: A Lot ?Help needed to walk in hospital room?: Total ?Help needed climbing 3-5 steps with a railing? : Total ?6 Click Score: 10 ? ?  ?End of Session   ?Activity Tolerance: Treatment limited secondary to medical complications (Comment);Patient tolerated treatment well ?Patient left: in bed;with call bell/phone within reach;with nursing/sitter in room (in recliner position with lunch tray presented) ?Nurse Communication: Mobility status ?PT Visit Diagnosis: Unsteadiness on feet (R26.81);Muscle weakness (generalized) (M62.81);History of falling (Z91.81);Difficulty in walking, not elsewhere classified (R26.2) ?  ? ? ?Time: 0301-3143 ?PT Time Calculation (min) (ACUTE ONLY): 21 min ? ?Charges:  $Therapeutic Activity: 8-22 mins          ?          ?1:19 PM, 06/27/21 ?Etta Grandchild, PT, DPT ?Physical Therapist - [place] ?[facility]  ?[phone] (ASCOM)  ? ? ? ?Chandlor Noecker C ?06/27/2021, 1:15 PM ? ?

## 2021-06-28 ENCOUNTER — Inpatient Hospital Stay: Payer: Medicare PPO

## 2021-06-28 ENCOUNTER — Inpatient Hospital Stay (HOSPITAL_COMMUNITY)
Admit: 2021-06-28 | Discharge: 2021-06-28 | Disposition: A | Discharge status: Home / Self Care | Payer: Medicare PPO | Attending: Family Medicine | Admitting: Family Medicine

## 2021-06-28 DIAGNOSIS — I248 Other forms of acute ischemic heart disease: Secondary | ICD-10-CM | POA: Diagnosis not present

## 2021-06-28 DIAGNOSIS — S82832A Other fracture of upper and lower end of left fibula, initial encounter for closed fracture: Secondary | ICD-10-CM | POA: Diagnosis not present

## 2021-06-28 DIAGNOSIS — R55 Syncope and collapse: Secondary | ICD-10-CM

## 2021-06-28 DIAGNOSIS — R011 Cardiac murmur, unspecified: Secondary | ICD-10-CM

## 2021-06-28 DIAGNOSIS — R296 Repeated falls: Secondary | ICD-10-CM | POA: Diagnosis not present

## 2021-06-28 DIAGNOSIS — N390 Urinary tract infection, site not specified: Secondary | ICD-10-CM | POA: Diagnosis not present

## 2021-06-28 DIAGNOSIS — R29818 Other symptoms and signs involving the nervous system: Secondary | ICD-10-CM | POA: Diagnosis present

## 2021-06-28 DIAGNOSIS — D649 Anemia, unspecified: Secondary | ICD-10-CM | POA: Diagnosis not present

## 2021-06-28 LAB — CBC
HCT: 14.5 % — CL (ref 36.0–46.0)
Hemoglobin: 4.8 g/dL — CL (ref 12.0–15.0)
MCH: 33.6 pg (ref 26.0–34.0)
MCHC: 33.1 g/dL (ref 30.0–36.0)
MCV: 101.4 fL — ABNORMAL HIGH (ref 80.0–100.0)
Platelets: 181 10*3/uL (ref 150–400)
RBC: 1.43 MIL/uL — ABNORMAL LOW (ref 3.87–5.11)
RDW: 15.5 % (ref 11.5–15.5)
WBC: 7.4 10*3/uL (ref 4.0–10.5)
nRBC: 0 % (ref 0.0–0.2)

## 2021-06-28 LAB — ECHOCARDIOGRAM COMPLETE
AR max vel: 1.91 cm2
AV Area VTI: 2.65 cm2
AV Area mean vel: 1.71 cm2
AV Mean grad: 4 mmHg
AV Peak grad: 7.4 mmHg
Ao pk vel: 1.36 m/s
Area-P 1/2: 3.5 cm2
Height: 68 in
MV VTI: 2.94 cm2
S' Lateral: 2.8 cm
Weight: 2631.41 oz

## 2021-06-28 LAB — GLUCOSE, CAPILLARY
Glucose-Capillary: 93 mg/dL (ref 70–99)
Glucose-Capillary: 88 mg/dL (ref 70–99)

## 2021-06-28 LAB — VITAMIN B12: Vitamin B-12: 172 pg/mL — ABNORMAL LOW (ref 180–914)

## 2021-06-28 LAB — HEMOGLOBIN AND HEMATOCRIT, BLOOD
HCT: 14.7 % — CL (ref 36.0–46.0)
HCT: 24.7 % — ABNORMAL LOW (ref 36.0–46.0)
Hemoglobin: 4.7 g/dL — CL (ref 12.0–15.0)
Hemoglobin: 8.3 g/dL — ABNORMAL LOW (ref 12.0–15.0)

## 2021-06-28 LAB — COMPREHENSIVE METABOLIC PANEL
ALT: 9 U/L (ref 0–44)
AST: 17 U/L (ref 15–41)
Albumin: 2.1 g/dL — ABNORMAL LOW (ref 3.5–5.0)
Alkaline Phosphatase: 26 U/L — ABNORMAL LOW (ref 38–126)
Anion gap: 7 (ref 5–15)
BUN: 60 mg/dL — ABNORMAL HIGH (ref 8–23)
CO2: 22 mmol/L (ref 22–32)
Calcium: 8 mg/dL — ABNORMAL LOW (ref 8.9–10.3)
Chloride: 111 mmol/L (ref 98–111)
Creatinine, Ser: 1.14 mg/dL — ABNORMAL HIGH (ref 0.44–1.00)
GFR, Estimated: 49 mL/min — ABNORMAL LOW (ref >60)
Glucose, Bld: 101 mg/dL — ABNORMAL HIGH (ref 70–99)
Potassium: 4.1 mmol/L (ref 3.5–5.1)
Sodium: 140 mmol/L (ref 135–145)
Total Bilirubin: 0.3 mg/dL (ref 0.3–1.2)
Total Protein: 4.2 g/dL — ABNORMAL LOW (ref 6.5–8.1)

## 2021-06-28 LAB — RETICULOCYTES
Immature Retic Fract: 27.3 % — ABNORMAL HIGH (ref 2.3–15.9)
RBC.: 1.46 MIL/uL — ABNORMAL LOW (ref 3.87–5.11)
Retic Count, Absolute: 60.3 10*3/uL (ref 19.0–186.0)
Retic Ct Pct: 4.1 % — ABNORMAL HIGH (ref 0.4–3.1)

## 2021-06-28 LAB — IRON AND TIBC
Iron: 36 ug/dL (ref 28–170)
Saturation Ratios: 17 % (ref 10.4–31.8)
TIBC: 210 ug/dL — ABNORMAL LOW (ref 250–450)
UIBC: 174 ug/dL

## 2021-06-28 LAB — MRSA NEXT GEN BY PCR, NASAL: MRSA by PCR Next Gen: NOT DETECTED

## 2021-06-28 LAB — FERRITIN: Ferritin: 22 ng/mL (ref 11–307)

## 2021-06-28 LAB — FOLATE: Folate: 10.7 ng/mL (ref >5.9)

## 2021-06-28 LAB — PREPARE RBC (CROSSMATCH)

## 2021-06-28 LAB — LACTIC ACID, PLASMA: Lactic Acid, Venous: 1.6 mmol/L (ref 0.5–1.9)

## 2021-06-28 MED ORDER — PANTOPRAZOLE INFUSION (NEW) - SIMPLE MED
8.0000 mg/h | INTRAVENOUS | Status: AC
  Administered 2021-06-28 – 2021-07-01 (×7): 8 mg/h via INTRAVENOUS
  Filled 2021-06-28: qty 100
  Filled 2021-06-28: qty 80
  Filled 2021-06-28 (×5): qty 100

## 2021-06-28 MED ORDER — SODIUM CHLORIDE 0.9% IV SOLUTION: Freq: Once | INTRAVENOUS | Status: AC

## 2021-06-28 MED ORDER — LABETALOL HCL 5 MG/ML IV SOLN
10.0000 mg | INTRAVENOUS | Status: DC | PRN
  Administered 2021-07-01: 10 mg via INTRAVENOUS
  Filled 2021-06-28: qty 4

## 2021-06-28 MED ORDER — PANTOPRAZOLE SODIUM 40 MG IV SOLR
40.0000 mg | Freq: Two times a day (BID) | INTRAVENOUS | Status: DC
  Administered 2021-07-02 – 2021-07-04 (×5): 40 mg via INTRAVENOUS
  Filled 2021-06-28 (×5): qty 10

## 2021-06-28 MED ORDER — PANTOPRAZOLE 80MG IVPB - SIMPLE MED
80.0000 mg | Freq: Once | INTRAVENOUS | Status: AC
  Administered 2021-06-28: 80 mg via INTRAVENOUS
  Filled 2021-06-28: qty 80

## 2021-06-28 MED ORDER — CHLORHEXIDINE GLUCONATE CLOTH 2 % EX PADS
6.0000 | MEDICATED_PAD | Freq: Every day | CUTANEOUS | Status: DC
  Administered 2021-06-28 – 2021-07-01 (×4): 6 via TOPICAL

## 2021-06-28 NOTE — Plan of Care (Signed)
Pt alert and oriented x 4. Norco given for pain x1 this shift. This am pt denied pain. Med complaint. Splint remains in place on left ankle.  ?Problem: Education: ?Goal: Knowledge of General Education information will improve ?Description: Including pain rating scale, medication(s)/side effects and non-pharmacologic comfort measures ?Outcome: Progressing ?  ?Problem: Health Behavior/Discharge Planning: ?Goal: Ability to manage health-related needs will improve ?Outcome: Progressing ?  ?Problem: Clinical Measurements: ?Goal: Ability to maintain clinical measurements within normal limits will improve ?Outcome: Progressing ?Goal: Will remain free from infection ?Outcome: Progressing ?Goal: Diagnostic test results will improve ?Outcome: Progressing ?Goal: Respiratory complications will improve ?Outcome: Progressing ?Goal: Cardiovascular complication will be avoided ?Outcome: Progressing ?  ?Problem: Activity: ?Goal: Risk for activity intolerance will decrease ?Outcome: Progressing ?  ?Problem: Nutrition: ?Goal: Adequate nutrition will be maintained ?Outcome: Progressing ?  ?Problem: Coping: ?Goal: Level of anxiety will decrease ?Outcome: Progressing ?  ?Problem: Elimination: ?Goal: Will not experience complications related to bowel motility ?Outcome: Progressing ?Goal: Will not experience complications related to urinary retention ?Outcome: Progressing ?  ?Problem: Pain Managment: ?Goal: General experience of comfort will improve ?Outcome: Progressing ?  ?Problem: Safety: ?Goal: Ability to remain free from injury will improve ?Outcome: Progressing ?  ?Problem: Skin Integrity: ?Goal: Risk for impaired skin integrity will decrease ?Outcome: Progressing ?  ?

## 2021-06-28 NOTE — Progress Notes (Signed)
OT Cancellation Note ? ?Patient Details ?Name: Ann Russell ?MRN: [id number] ?DOB: 14-Aug-1940 ? ? ?Cancelled Treatment:    Reason Eval/Treat Not Completed: Medical issues which prohibited therapy. Chart reviewed. Pt noted with transfer to SDU with Hgb 4.8. Will sign off due to change in pt status and need to transfer to higher level of care. Please re-consult therapy once pt is medically stable and appropriate for re-evaluation and continuation of therapy.  ? ?Ardeth Perfect., MPH, MS, OTR/L ?ascom [phone] ?06/28/21, 3:04 PM ? ? ?

## 2021-06-28 NOTE — Assessment & Plan Note (Signed)
Reports impaired sensation in fingers bilaterally. No neck pain or other evidence of radiculopathy. CT head ordered and reviewed which shows no bleed or other acute changes. If symptoms persist despite normalization of blood pressure and clinical stability, would pursue further work up.  ?

## 2021-06-28 NOTE — Assessment & Plan Note (Addendum)
7 drop in hemoglobin felt to be secondary to bleeding ulcer from [place] anastomosis both acute and chronic.  Patient has been on Eliquis.  Appreciate gastroenterology help.  Ulcer cauterized and treated with epinephrine.  Completed IV PPI drip on afternoon of 3/10.  Discussed with GI and likely would not recommend restarting Eliquis at least for the another month.   ? ?Following EGD, hemoglobin slowly trending back down and patient status post 1 unit packed red blood cells on 3/11.  Hemoglobin has since remained stable.  GI recommends that patient stay off of Eliquis at least for a month, but if she does need to restart this medication, can have follow-up reassessment ?

## 2021-06-28 NOTE — Progress Notes (Signed)
PT Cancellation Note ? ?Patient Details ?Name: Ann Russell ?MRN: [id number] ?DOB: 08-Aug-1940 ? ? ?Cancelled Treatment:    Reason Eval/Treat Not Completed: Medical issues which prohibited therapy (Pt transfered to SDU this date. WIll sign off. Please place new order once pt is ready to resume therapy.) ? ?2:41 PM, 06/28/21 ?Etta Grandchild, PT, DPT ?Physical Therapist - [place] ?[facility]  ?[phone] (ASCOM)  ? ? ?Ann Russell ?06/28/2021, 2:41 PM ?

## 2021-06-28 NOTE — Progress Notes (Signed)
Lab called with critical hgb 4.8 and hct 14.5. Dr Bonner Puna notified ?

## 2021-06-28 NOTE — Progress Notes (Signed)
*  PRELIMINARY RESULTS* ?Echocardiogram ?2D Echocardiogram has been performed. ? ?Hollie Wojahn, [place]onia Side ?06/28/2021, 7:26 AM ?

## 2021-06-28 NOTE — Progress Notes (Signed)
PT Cancellation Note ? ?Patient Details ?Name: Ann Russell ?MRN: [id number] ?DOB: 1940-07-18 ? ? ?Cancelled Treatment:    Reason Eval/Treat Not Completed: Medical issues which prohibited therapy (Per RN, most recent BP: 80/23mHg. MD has been contacted. Will resume PT services at later date/time.) ? ?10:42 AM, 06/28/21 ?AEtta Grandchild PT, DPT ?[facility] ?[phone](ASCOM)  ? ? ?Cerenity Goshorn C ?06/28/2021, 10:42 AM ?

## 2021-06-28 NOTE — Progress Notes (Addendum)
?   06/28/21 1033  ?Assess: MEWS Score  ?Temp 98.4 ?F (36.9 ?C)  ?BP (!) 80/56  ?Pulse Rate 98  ?Resp 16  ?SpO2 98 %  ?O2 Device Room Air  ?Assess: MEWS Score  ?MEWS Temp 0  ?MEWS Systolic 2  ?MEWS Pulse 0  ?MEWS RR 0  ?MEWS LOC 0  ?MEWS Score 2  ?MEWS Score Color Yellow  ?Assess: if the MEWS score is Yellow or Red  ?Were vital signs taken at a resting state? Yes  ?Focused Assessment No change from prior assessment  ?Does the patient meet 2 or more of the SIRS criteria? No  ?MEWS guidelines implemented *See Row Information* Yes  ?Treat  ?MEWS Interventions Escalated (See documentation below)  ?Take Vital Signs  ?Increase Vital Sign Frequency  Yellow: Q 2hr X 2 then Q 4hr X 2, if remains yellow, continue Q 4hrs  ?Notify: Charge Nurse/RN  ?Name of Charge Nurse/RN Notified Zonia Kief  ?Date Charge Nurse/RN Notified 06/28/21  ?Time Charge Nurse/RN Notified 1033  ?Notify: Provider  ?Provider Name/Title Dr Bonner Puna  ?Date Provider Notified 06/28/21  ?Time Provider Notified 1040  ?Notification Type Page  ?Notification Reason Change in status  ?Provider response At bedside  ?Date of Provider Response 06/28/21  ?Time of Provider Response 1040  ?Assess: SIRS CRITERIA  ?SIRS Temperature  0  ?SIRS Pulse 1  ?SIRS Respirations  0  ?SIRS WBC 0  ?SIRS Score Sum  1  ? ?Patient called out saying she felt dizzy and her first 3 fingers on her left hand are numb. BP was low. Dr Bonner Puna notified and came to see the patient. Patient placed on telemetry and EKG and labs done. The plan is to transfer the patient to 2A ?

## 2021-06-28 NOTE — Progress Notes (Signed)
Progress Note  Patient: Ann Russell Ann Russell DOB: 1940-09-25  DOA: 06/25/2021  DOS: 06/28/2021    Brief hospital course: Ann Russell is a 81 y.o. 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 Main complaints was left ankle pain with inability to bear weight. Imaging confirmed nondisplaced, closed fractures of the distal left fibula and proximal left 3rd metatarsal. Urinalysis is strongly suggestive of infection, and the patient does report some lower abdominal discomfort, urinary urgency. Posterior splint was applied. Ceftriaxone was started and the patient was admitted, confirmed to have severe orthostatic hypotension. She is maintained in CAM boot per podiatry recommendations, is undergoing work up and management for orthostatic hypotension with significant, symptomatic anemia developing 3/7 prompting transfusion, CT abd/pelvis which showed no evidence or source of bleeding, transfer to SDU, and GI consultation.   Assessment and Plan: * UTI (urinary tract infection) Urine culture without growth. Completed 3 doses of ceftriaxone  Hypotension BP with systolic in the 82N on arrival. Also several recent ED trips for syncope PTA. Persistent orthostasis here despite adequate IV fluid resuscitation. Echo performed today shows preserved LVEF 55-60%, G1DD associated with mild LVH, small circumferential pericardial effusion without mention of diastolic collapse/tamponade.  - Started TED hose, midodrine - Despite no clinical evidence of bleeding, hgb drastically lower on recheck 3/7. See separate discussion. - Transfer to higher level of care given severity of hypotension and potential for decompensation. Lactic acid not elevated and renal function actually improving, reassuring that perfusion is currently adequate.  Closed fibular  fracture On the left. Nondisplaced. Closed. Also with nondisplaced proximal 3rd metatarsal fracture.  - Discussed with Dr. Vickki Muff, podiatry, who confirms nonoperative management plan. Recommends trial of partial weight bearing (25-50%) LLE with CAM boot which has been applied. No need for splint. - Follow up in clinic 1-2 weeks. - Pain control adequate.  Frequent falls Likely multifactorial related to hypotension, neuropathy, physical deconditioning, UTI - Continue treating orthostatic hypotension.  - This patient is at exceedingly high risk of falling without adequate assistance. CSW consulted. Despite pt's reservations, I strongly recommend she entertain the idea of SNF for acute rehab at discharge.  Symptomatic anemia Drastic decline in hgb despite no definite bleeding source. CT abd/pelvis STAT revealed no etiology or evidence of infection. No gross hematuria. Pt is not having hematochezia or melena or vomiting. She does have widespread ecchymoses though these are stable. Note bilirubin wnl, suggesting hemolysis is not a cause. - Transfuse 2u PRBCs stat now, transfer to SDU, recheck H/H and repeat transfusion for hgb < 7g/dl or higher if bleeding develops.  - GI consulted empirically. Though there is no definite GI bleeding, BUN is noted to have risen significantly despite decline in creatinine. Will start PPI, make NPO p MN. - Check anemia panel on pretransfusion specimen.   Neurological deficit present Reports impaired sensation in fingers bilaterally. No neck pain or other evidence of radiculopathy. CT head ordered and reviewed which shows no bleed or other acute changes. If symptoms persist despite normalization of blood pressure and clinical stability, would pursue further work up.   Demand ischemia (HCC) Mild troponin elevation is down-trending without anginal symptoms or ischemic ECG changes. No definite RWMA on suboptimal echo.  - No further intervention currently planned.  Stage  3b chronic kidney disease ([place]) Presented with AKI with  creatinine 1.75, now down to 1.14.  - Avoid nephrotoxins.  Neuropathy Uses assistive devices, wheelchair, scooter, walker  History of pulmonary embolism - We have to hold eliquis for now. Note normal RV size/function on echo.   Hypertension Currently having severe orthostatic hypotension. Not on anti-HTN medications PTA.  Subjective: This morning she looked more worn out, confirmed she felt too weak to even move her extremities. Her BP was getting lower, low even when supine. She adamantly denies bleeding, RN has gotten no report of bloody stools. Urine is frankly clear/yellow. She denies abdominal pain. She ate breakfast without nausea or vomiting.   Objective: Vitals:   06/28/21 1242 06/28/21 1344 06/28/21 1526 06/28/21 1600  BP: (!) 96/51 (!) 124/54 (!) 116/56 122/62  Pulse: 70 72 81 72  Resp: '14 20 13 15  '$ Temp: 98.5 F (36.9 C) 97.8 F (36.6 C) 97.9 F (36.6 C)   TempSrc: Oral Oral Oral   SpO2: 97% 100% 100% 100%  Weight:      Height:       Gen: Pleasant, frail 81 y.o. female in no distress Pulm: Nonlabored breathing room air. Clear. CV: Regular rate and rhythm. No murmur, rub, or gallop. No JVD, no pitting dependent edema. GI: Abdomen soft, non-tender, non-distended, with normoactive bowel sounds.  Ext: Warm, no deformities. CAM boot on LLE with sensation intact to ankles bilaterally which is baseline. Skin: No new rashes, lesions or ulcers on visualized skin. Neuro: Alert and oriented. No new focal neurological deficits. Psych: Judgement and insight appear fair. Mood euthymic & affect congruent. Behavior is appropriate.    Data Personally reviewed: CBC: Recent Labs  Lab 06/23/21 1714 06/25/21 2240 06/28/21 1113  WBC 6.8 6.4 7.4  NEUTROABS 4.0  --   --   HGB 13.4 12.6 4.8*  HCT 41.1 37.9 14.5*  MCV 100.5* 97.4 101.4*  PLT 234 241 301   Basic Metabolic Panel: Recent Labs  Lab 06/23/21 1550  06/23/21 1845 06/25/21 2240 06/27/21 0428 06/28/21 1401  NA 138 136 133* 141 140  K 4.1 4.3 3.7 3.9 4.1  CL 105 104 101 110 111  CO2 '24 23 24 23 22  '$ GLUCOSE 104* 90 98 86 101*  BUN 37* 26* 36* 75* 60*  CREATININE 1.58* 1.30* 1.75* 1.42* 1.14*  CALCIUM 10.5* 9.3 9.4 8.2* 8.0*   GFR: Estimated Creatinine Clearance: 40.4 mL/min (A) (by C-G formula based on SCr of 1.14 mg/dL (H)). Liver Function Tests: Recent Labs  Lab 06/23/21 1550 06/23/21 1845 06/25/21 2240 06/28/21 1401  AST '19 28 22 17  '$ ALT '12 11 14 9  '$ ALKPHOS  --  37* 42 26*  BILITOT 0.4 1.0 0.4 0.3  PROT 6.1 5.3* 6.5 4.2*  ALBUMIN  --  2.8* 3.4* 2.1*   Recent Labs  Lab 06/23/21 1845  LIPASE 52*   No results for input(s): AMMONIA in the last 168 hours. Coagulation Profile: Recent Labs  Lab 06/23/21 1714  INR 1.4*   Cardiac Enzymes: No results for input(s): CKTOTAL, CKMB, CKMBINDEX, TROPONINI in the last 168 hours. BNP (last 3 results) No results for input(s): PROBNP in the last 8760 hours. HbA1C: No results for input(s): HGBA1C in the last 72 hours. CBG: Recent Labs  Lab 06/28/21 0745 06/28/21 1545  GLUCAP 93 88   Lipid Profile: No results for input(s): CHOL, HDL, LDLCALC, TRIG, CHOLHDL, LDLDIRECT in the last 72 hours. Thyroid Function Tests: No results for input(s): TSH, T4TOTAL, FREET4, T3FREE, THYROIDAB in the last 72 hours. Anemia Panel:  Recent Labs    06/28/21 1401  FOLATE 10.7  FERRITIN 22  TIBC 210*  IRON 36  RETICCTPCT 4.1*   Urine analysis:    Component Value Date/Time   COLORURINE YELLOW (A) 06/26/2021 0257   APPEARANCEUR HAZY (A) 06/26/2021 0257   APPEARANCEUR CLOUDY 05/13/2011 1033   LABSPEC 1.008 06/26/2021 0257   LABSPEC SEE COMMENT 05/13/2011 1033   PHURINE 6.0 06/26/2021 0257   GLUCOSEU NEGATIVE 06/26/2021 0257   GLUCOSEU see comment 05/13/2011 1033   HGBUR NEGATIVE 06/26/2021 0257   BILIRUBINUR NEGATIVE 06/26/2021 0257   BILIRUBINUR SEE COMMENT 05/13/2011 1033    KETONESUR NEGATIVE 06/26/2021 0257   PROTEINUR NEGATIVE 06/26/2021 0257   NITRITE NEGATIVE 06/26/2021 0257   LEUKOCYTESUR LARGE (A) 06/26/2021 0257   LEUKOCYTESUR SEE COMMENT 05/13/2011 1033   Recent Results (from the past 240 hour(s))  Resp Panel by RT-PCR (Flu A&B, Covid) Nasopharyngeal Swab     Status: None   Collection Time: 06/23/21  5:14 PM   Specimen: Nasopharyngeal Swab; Nasopharyngeal(NP) swabs in vial transport medium  Result Value Ref Range Status   SARS Coronavirus 2 by RT PCR NEGATIVE NEGATIVE Final    Comment: (NOTE) SARS-CoV-2 target nucleic acids are NOT DETECTED.  The SARS-CoV-2 RNA is generally detectable in upper respiratory specimens during the acute phase of infection. The lowest concentration of SARS-CoV-2 viral copies this assay can detect is 138 copies/mL. A negative result does not preclude SARS-Cov-2 infection and should not be used as the sole basis for treatment or other patient management decisions. A negative result may occur with  improper specimen collection/handling, submission of specimen other than nasopharyngeal swab, presence of viral mutation(s) within the areas targeted by this assay, and inadequate number of viral copies(<138 copies/mL). A negative result must be combined with clinical observations, patient history, and epidemiological information. The expected result is Negative.  Fact Sheet for Patients:  EntrepreneurPulse.com.au  Fact Sheet for Healthcare Providers:  IncredibleEmployment.be  This test is no t yet approved or cleared by the [place] FDA and  has been authorized for detection and/or diagnosis of SARS-CoV-2 by FDA under an Emergency Use Authorization (EUA). This EUA will remain  in effect (meaning this test can be used) for the duration of the COVID-19 declaration under Section 564(b)(1) of the Act, 21 U.S.C.section 360bbb-3(b)(1), unless the authorization is terminated  or revoked  sooner.       Influenza A by PCR NEGATIVE NEGATIVE Final   Influenza B by PCR NEGATIVE NEGATIVE Final    Comment: (NOTE) The Xpert Xpress SARS-CoV-2/FLU/RSV plus assay is intended as an aid in the diagnosis of influenza from Nasopharyngeal swab specimens and should not be used as a sole basis for treatment. Nasal washings and aspirates are unacceptable for Xpert Xpress SARS-CoV-2/FLU/RSV testing.  Fact Sheet for Patients: EntrepreneurPulse.com.au  Fact Sheet for Healthcare Providers: IncredibleEmployment.be  This test is not yet approved or cleared by the [place] FDA and has been authorized for detection and/or diagnosis of SARS-CoV-2 by FDA under an Emergency Use Authorization (EUA). This EUA will remain in effect (meaning this test can be used) for the duration of the COVID-19 declaration under Section 564(b)(1) of the Act, 21 U.S.C. section 360bbb-3(b)(1), unless the authorization is terminated or revoked.  Performed at [facility], [address], [place], [place] [address]   Urine Culture     Status: None   Collection Time: 06/26/21  2:57 AM   Specimen: Urine, Clean Catch  Result Value Ref Range Status  Specimen Description   Final    URINE, CLEAN CATCH Performed at [facility], [address] [place], [place] [address]    Special Requests   Final    NONE Performed at [facility], [address] [place], [place] [address]    Culture   Final    NO GROWTH Performed at [facility], [place] [address], [place], [place] [address]    Report Status 06/27/2021 FINAL  Final  Resp Panel by RT-PCR (Flu A&B, Covid) Nasopharyngeal Swab     Status: None   Collection Time: 06/26/21  4:18 AM   Specimen: Nasopharyngeal Swab; Nasopharyngeal(NP) swabs in vial transport medium  Result Value Ref Range Status   SARS Coronavirus 2 by RT PCR NEGATIVE NEGATIVE Final    Comment: (NOTE) SARS-CoV-2 target  nucleic acids are NOT DETECTED.  The SARS-CoV-2 RNA is generally detectable in upper respiratory specimens during the acute phase of infection. The lowest concentration of SARS-CoV-2 viral copies this assay can detect is 138 copies/mL. A negative result does not preclude SARS-Cov-2 infection and should not be used as the sole basis for treatment or other patient management decisions. A negative result may occur with  improper specimen collection/handling, submission of specimen other than nasopharyngeal swab, presence of viral mutation(s) within the areas targeted by this assay, and inadequate number of viral copies(<138 copies/mL). A negative result must be combined with clinical observations, patient history, and epidemiological information. The expected result is Negative.  Fact Sheet for Patients:  EntrepreneurPulse.com.au  Fact Sheet for Healthcare Providers:  IncredibleEmployment.be  This test is no t yet approved or cleared by the [place] FDA and  has been authorized for detection and/or diagnosis of SARS-CoV-2 by FDA under an Emergency Use Authorization (EUA). This EUA will remain  in effect (meaning this test can be used) for the duration of the COVID-19 declaration under Section 564(b)(1) of the Act, 21 U.S.C.section 360bbb-3(b)(1), unless the authorization is terminated  or revoked sooner.       Influenza A by PCR NEGATIVE NEGATIVE Final   Influenza B by PCR NEGATIVE NEGATIVE Final    Comment: (NOTE) The Xpert Xpress SARS-CoV-2/FLU/RSV plus assay is intended as an aid in the diagnosis of influenza from Nasopharyngeal swab specimens and should not be used as a sole basis for treatment. Nasal washings and aspirates are unacceptable for Xpert Xpress SARS-CoV-2/FLU/RSV testing.  Fact Sheet for Patients: EntrepreneurPulse.com.au  Fact Sheet for Healthcare  Providers: IncredibleEmployment.be  This test is not yet approved or cleared by the [place] FDA and has been authorized for detection and/or diagnosis of SARS-CoV-2 by FDA under an Emergency Use Authorization (EUA). This EUA will remain in effect (meaning this test can be used) for the duration of the COVID-19 declaration under Section 564(b)(1) of the Act, 21 U.S.C. section 360bbb-3(b)(1), unless the authorization is terminated or revoked.  Performed at [facility], [address] [place], [place] [address]      CT ABDOMEN PELVIS WO CONTRAST  Result Date: 06/28/2021 CLINICAL DATA:  Retro peritoneal bleed suspected. EXAM: CT ABDOMEN AND PELVIS WITHOUT CONTRAST TECHNIQUE: Multidetector CT imaging of the abdomen and pelvis was performed following the standard protocol without IV contrast. RADIATION DOSE REDUCTION: This exam was performed according to the departmental dose-optimization program which includes automated exposure control, adjustment of the mA and/or kV according to patient size and/or use of iterative reconstruction technique. COMPARISON:  06/23/2021 FINDINGS: Lower chest: Minimal scarring or atelectasis at the lung bases.  Hepatobiliary: Liver parenchyma appears normal without contrast. Previous cholecystectomy. Pancreas: Normal Spleen: Normal Adrenals/Urinary Tract: Adrenal glands are normal. Renal cortical atrophy with multiple renal cysts. Nonobstructing 2 mm stones in the right kidney. No hydroureteronephrosis. The bladder is normal. Stomach/Bowel: Previous gastric surgery. Small bowel appears normal. No acute or significant: Finding. Diverticulosis without evidence of diverticulitis. Vascular/Lymphatic: Aortic atherosclerosis. No aneurysm. IVC is normal. No retroperitoneal adenopathy or retroperitoneal bleeding. Reproductive: Previous hysterectomy.  No pelvic mass. Other: No free fluid or air. Periumbilical ventral hernia containing only fat.  Musculoskeletal: Chronic lumbar degenerative changes. IMPRESSION: No evidence of retroperitoneal or intraperitoneal hemorrhage. Aortic Atherosclerosis (ICD10-I70.0). Previous cholecystectomy. Previous hysterectomy. Previous gastric surgery. Electronically Signed   By: Nelson Chimes M.D.   On: 06/28/2021 15:03   CT HEAD WO CONTRAST (5MM)  Result Date: 06/28/2021 CLINICAL DATA:  Neuro deficit, acute, stroke suspected EXAM: CT HEAD WITHOUT CONTRAST TECHNIQUE: Contiguous axial images were obtained from the base of the skull through the vertex without intravenous contrast. RADIATION DOSE REDUCTION: This exam was performed according to the departmental dose-optimization program which includes automated exposure control, adjustment of the mA and/or kV according to patient size and/or use of iterative reconstruction technique. COMPARISON:  June 23, 2021. FINDINGS: Brain: No evidence of acute large vascular territory infarction, hemorrhage, hydrocephalus, extra-axial collection or mass lesion/mass effect. Cerebral atrophy. Mild patchy white matter hypoattenuation, nonspecific but paddle with chronic microvascular ischemic disease. Vascular: No hyperdense vessel identified. Calcific intracranial atherosclerosis. Skull: No acute fracture. Sinuses/Orbits: Opacification of scattered ethmoid air cells. Otherwise, visualized sinuses are largely clear. Postsurgical changes of the left globe, partially imaged. Other: No mastoid effusions. IMPRESSION: 1. No evidence of acute intracranial abnormality by CT. MRI could provide more sensitive evaluation for acute infarct if clinically indicated. 2. Atrophy and chronic microvascular disease. Electronically Signed   By: Margaretha Sheffield M.D.   On: 06/28/2021 13:40   ECHOCARDIOGRAM COMPLETE  Result Date: 06/28/2021    ECHOCARDIOGRAM REPORT   Patient Name:   YAILEN ZEMAITIS Date of Exam: 06/28/2021 Medical Rec #:  [id number]     Height:       68.0 in Accession #:    [id number]    Weight:        164.5 lb Date of Birth:  [birth year]     BSA:          1.881 m Patient Age:    28 years      BP:           110/57 mmHg Patient Gender: F             HR:           72 bpm. Exam Location:  [facility] Procedure: 2D Echo, Cardiac Doppler and Color Doppler Indications:     Murmur R01.1                  Syncope R55  History:         Patient has no prior history of Echocardiogram examinations.                  Risk Factors:Hypertension, Dyslipidemia and Sleep Apnea. CKD,                  pulmonary embolism.  Sonographer:     Sherrie Sport Referring Phys:  [id number] Patrecia Pour Diagnosing Phys: Nelva Bush MD  Sonographer Comments: Suboptimal apical window. IMPRESSIONS  1. Left ventricular ejection fraction, by estimation, is 55 to 60%. The left ventricle  has normal function. Left ventricular endocardial border not optimally defined to evaluate regional wall motion. There is mild left ventricular hypertrophy. Left ventricular diastolic parameters are consistent with Grade I diastolic dysfunction (impaired relaxation).  2. Right ventricular systolic function is normal. The right ventricular size is normal.  3. A small pericardial effusion is present. The pericardial effusion is circumferential.  4. The mitral valve is degenerative. Trivial mitral valve regurgitation. No evidence of mitral stenosis.  5. The aortic valve was not well visualized. Aortic valve regurgitation is not visualized. No aortic stenosis is present. FINDINGS  Left Ventricle: Left ventricular ejection fraction, by estimation, is 55 to 60%. The left ventricle has normal function. Left ventricular endocardial border not optimally defined to evaluate regional wall motion. The left ventricular internal cavity size was normal in size. There is mild left ventricular hypertrophy. Left ventricular diastolic parameters are consistent with Grade I diastolic dysfunction (impaired relaxation). Right Ventricle: The right ventricular size is normal. No increase in right  ventricular wall thickness. Right ventricular systolic function is normal. Left Atrium: Left atrial size was normal in size. Right Atrium: Right atrial size was normal in size. Pericardium: A small pericardial effusion is present. The pericardial effusion is circumferential. Mitral Valve: The mitral valve is degenerative in appearance. There is mild thickening of the mitral valve leaflet(s). Mild mitral annular calcification. Trivial mitral valve regurgitation. No evidence of mitral valve stenosis. MV peak gradient, 4.2 mmHg. The mean mitral valve gradient is 2.0 mmHg. Tricuspid Valve: The tricuspid valve is normal in structure. Tricuspid valve regurgitation is trivial. Aortic Valve: The aortic valve was not well visualized. Aortic valve regurgitation is not visualized. No aortic stenosis is present. Aortic valve mean gradient measures 4.0 mmHg. Aortic valve peak gradient measures 7.4 mmHg. Aortic valve area, by VTI measures 2.65 cm. Pulmonic Valve: The pulmonic valve was not well visualized. Aorta: The aortic root is normal in size and structure. Pulmonary Artery: The pulmonary artery is not well seen. IAS/Shunts: The interatrial septum was not well visualized.  LEFT VENTRICLE PLAX 2D LVIDd:         4.60 cm   Diastology LVIDs:         2.80 cm   LV e' medial:    6.64 cm/s LV PW:         1.20 cm   LV E/e' medial:  9.7 LV IVS:        0.90 cm   LV e' lateral:   9.14 cm/s LVOT diam:     2.00 cm   LV E/e' lateral: 7.0 LV SV:         60 LV SV Index:   32 LVOT Area:     3.14 cm  RIGHT VENTRICLE RV Basal diam:  3.30 cm RV S prime:     16.10 cm/s TAPSE (M-mode): 2.2 cm LEFT ATRIUM           Index        RIGHT ATRIUM           Index LA diam:      3.60 cm 1.91 cm/m   RA Area:     17.59 cm LA Vol (A4C): 35.7 ml 18.98 ml/m  RA Volume:   51.71 ml  27.49 ml/m  AORTIC VALVE AV Area (Vmax):    1.91 cm AV Area (Vmean):   1.71 cm AV Area (VTI):     2.65 cm AV Vmax:           136.00 cm/s AV  Vmean:          87.300 cm/s AV VTI:             0.228 m AV Peak Grad:      7.4 mmHg AV Mean Grad:      4.0 mmHg LVOT Vmax:         82.90 cm/s LVOT Vmean:        47.400 cm/s LVOT VTI:          0.192 m LVOT/AV VTI ratio: 0.84  AORTA Ao Root diam: 2.93 cm MITRAL VALVE               TRICUSPID VALVE MV Area (PHT): 3.50 cm    TR Peak grad:   17.6 mmHg MV Area VTI:   2.94 cm    TR Vmax:        210.00 cm/s MV Peak grad:  4.2 mmHg MV Mean grad:  2.0 mmHg    SHUNTS MV Vmax:       1.03 m/s    Systemic VTI:  0.19 m MV Vmean:      63.9 cm/s   Systemic Diam: 2.00 cm MV Decel Time: 217 msec MV E velocity: 64.30 cm/s MV A velocity: 88.30 cm/s MV E/A ratio:  0.73 Harrell Gave End MD Electronically signed by Nelva Bush MD Signature Date/Time: 06/28/2021/8:44:22 AM    Final     SARS-CoV-2 PCR: Negative Influenza A/B: Negative  Family Communication: None at bedside.   Disposition: Status is: Inpatient Remains inpatient appropriate because: Symptomatic severe anemia, needs work up into etiology and transfusion support, possible pressors. Planned Discharge Destination:  Favor SNF > home if pt agreeable.  Patrecia Pour, MD 06/28/2021 4:11 PM Page by [email or web address]

## 2021-06-28 NOTE — H&P (Addendum)
[name] , MD [address] [place], [place], [place], [address] [id number], [place], [place], [place], [address] Phone: [phone]  Fax: [phone]  Consultation  Referring Provider:     Dr Bonner Puna Primary Care Physician:  Jearld Fenton, NP Primary Gastroenterologist:  None          Reason for Consultation:     Low Hb  Date of Admission:  06/25/2021 Date of Consultation:  06/28/2021         HPI:   Ann Russell is a 81 y.o. female With a recent history of PE s/p thrombectomy on Eliquis, stage III CKD, presented to the emergency room after tripping on trash can after coming out of the bathroom and falling.  Was found to have a nondisplaced fracture of distal left fibula and third metatarsal.  Was found to have a UTI, hypotension.  Baseline hemoglobin over 13 g.  On admission was found to have a hemoglobin of 9.4 g.  This morning when hemoglobin was checked was 4.9 g.9.4 g no overt bleeding noted.  Mild elevation in BUN/creatinine ratio noted at 75 and 1.42 versus 36 and 1.75.  She denies any abdominal pain , hematemesis, rectal bleeding , abdominal distension or discomfort. Nursing denies any overt blood loss. Last bowel movement was > 1 day back.   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  Takes Vasotec for kidneys.-stage 3   Depression    Hyperlipemia    Hypertension    h/o had gastric bypass and was able to come off meds   Neuropathy    Pulmonary embolism ([facility])    Sleep apnea    no cpap since her gastric bypass    Past Surgical History:  Procedure Laterality Date   ABDOMINAL HYSTERECTOMY     Buniectomy     bil   CHOLECYSTECTOMY     FACIAL COSMETIC SURGERY  2011   GASTRIC BYPASS  2010   PULMONARY THROMBECTOMY Bilateral 06/22/2017   Procedure: PULMONARY THROMBECTOMY;  Surgeon: Algernon Huxley, MD;  Location: [facility];  Service: Cardiovascular;   Laterality: Bilateral;   Rotor cuff     right   SCLERAL BUCKLE  11/07/2011   Procedure: SCLERAL BUCKLE;  Surgeon: Hayden Pedro, MD;  Location: [facility];  Service: Ophthalmology;  Laterality: Left;   TOTAL KNEE ARTHROPLASTY Right 12/03/2018   Procedure: RIGHT TOTAL KNEE ARTHROPLASTY;  Surgeon: Hessie Knows, MD;  Location: [facility];  Service: Orthopedics;  Laterality: Right;    Prior to Admission medications   Medication Sig Start Date End Date Taking? Authorizing Provider  acetaminophen (TYLENOL) 650 MG CR tablet Take 650 mg by mouth 2 (two) times a day.   Yes [provider]  apixaban (ELIQUIS) 5 MG TABS tablet Take 1 tablet (5 mg total) by mouth 2 (two) times daily. 05/23/21  Yes Baity, Coralie Keens, NP  B COMPLEX VITAMINS SL Place 1 tablet under the tongue daily.   Yes [provider]  busPIRone (BUSPAR) 15 MG tablet Take 1 tablet (15 mg total) by mouth 2 (two) times daily. 05/23/21  Yes Baity, Coralie Keens, NP  calcitRIOL (ROCALTROL) 0.25 MCG capsule Take 1 capsule (0.25 mcg total) by mouth daily. 05/23/21  Yes Jearld Fenton, NP  Calcium Carb-Cholecalciferol (CALCIUM 600 + D PO) Take  1 tablet by mouth daily.   Yes [provider]  denosumab (PROLIA) 60 MG/ML SOSY injection Inject 60 mg into the skin every 6 (six) months. 12/14/20  Yes [provider]  ferrous [id number] C-folic acid (TRINSICON / FOLTRIN) capsule Take 1 capsule by mouth 2 (two) times daily. 12/06/18  Yes Duanne Guess, PA-C  FLUoxetine (PROZAC) 20 MG capsule Take 3 capsules (60 mg total) by mouth every morning. Patient taking differently: Take 20 mg by mouth 3 (three) times daily. 05/23/21  Yes Baity, Coralie Keens, NP  Multiple Vitamin (MULTI-VITAMINS) TABS Take 2 tablets by mouth 2 (two) times daily.   Yes [provider]  omeprazole (PRILOSEC) 20 MG capsule Take 1 capsule (20 mg total) by mouth daily. 06/23/21  Yes Baity, Coralie Keens, NP  pregabalin (LYRICA) 100 MG capsule Take 1 capsule  (100 mg total) by mouth 3 (three) times daily. 05/23/21 05/23/22 Yes Baity, Coralie Keens, NP  Pyridoxine HCl (VITAMIN B6 PO) Take 1 tablet by mouth daily. With b12   Yes [provider]  valACYclovir (VALTREX) 1000 MG tablet Take 1 tablet (1,000 mg total) by mouth daily as needed (fever blisters). 05/23/21  Yes Baity, Coralie Keens, NP  bisacodyl (DULCOLAX) 5 MG EC tablet Take 1 tablet (5 mg total) by mouth daily as needed for moderate constipation. Patient not taking: Reported on 06/26/2021 12/06/18   Duanne Guess, PA-C  HYDROcodone-acetaminophen (NORCO/VICODIN) 5-325 MG tablet Take 1 tablet by mouth every 8 (eight) hours as needed for moderate pain. Patient not taking: Reported on 06/26/2021 06/23/21   Jearld Fenton, NP    Family History  Problem Relation Age of Onset   Heart disease Mother    Dementia Mother    Neuropathy Father    Diabetes Brother    Breast cancer Neg Hx      Social History   Tobacco Use   Smoking status: Never   Smokeless tobacco: Never  Vaping Use   Vaping Use: Never used  Substance Use Topics   Alcohol use: No   Drug use: No    Allergies as of 06/25/2021   (No Known Allergies)    Review of Systems:    All systems reviewed and negative except where noted in HPI.   Physical Exam:  Vital signs in last 24 hours: Temp:  [97.8 F (36.6 C)-99.2 F (37.3 C)] 98.5 F (36.9 C) (03/07 1242) Pulse Rate:  [69-98] 70 (03/07 1242) Resp:  [14-18] 14 (03/07 1242) BP: (80-145)/(50-62) 96/51 (03/07 1242) SpO2:  [97 %-99 %] 97 % (03/07 1242) Last BM Date : 06/25/21 General:   Pleasant, cooperative in NAD Head:  Normocephalic and atraumatic. Eyes:   No icterus.   Conjunctiva pink. PERRLA. Ears:  Normal auditory acuity. Neck:  Supple; no masses or thyroidomegaly Lungs: Respirations even and unlabored. Lungs clear to auscultation bilaterally.   No wheezes, crackles, or rhonchi.  Heart:  Regular rate and rhythm;  Without murmur, clicks, rubs or gallops Abdomen:   Soft, nondistended, nontender. Normal bowel sounds. No appreciable masses or hepatomegaly.  No rebound or guarding.  Neurologic:  Alert and oriented x3;  grossly normal neurologically. Psych:  Alert and cooperative. Normal affect. Extremities: left leg in cast - no swelling or discoloration  LAB RESULTS: Recent Labs    06/25/21 2240 06/28/21 1113  WBC 6.4 7.4  HGB 12.6 4.8*  HCT 37.9 14.5*  PLT 241 181   BMET Recent Labs    06/25/21 2240 06/27/21 0428  NA  133* 141  K 3.7 3.9  CL 101 110  CO2 24 23  GLUCOSE 98 86  BUN 36* 75*  CREATININE 1.75* 1.42*  CALCIUM 9.4 8.2*   LFT Recent Labs    06/25/21 2240  PROT 6.5  ALBUMIN 3.4*  AST 22  ALT 14  ALKPHOS 42  BILITOT 0.4   PT/INR No results for input(s): LABPROT, INR in the last 72 hours.  STUDIES: ECHOCARDIOGRAM COMPLETE  Result Date: 06/28/2021    ECHOCARDIOGRAM REPORT   Patient Name:   Ann Russell Date of Exam: 06/28/2021 Medical Rec #:  [id number]     Height:       68.0 in Accession #:    [id number]    Weight:       164.5 lb Date of Birth:  03-04-1941     BSA:          1.881 m Patient Age:    2 years      BP:           110/57 mmHg Patient Gender: F             HR:           72 bpm. Exam Location:  [facility] Procedure: 2D Echo, Cardiac Doppler and Color Doppler Indications:     Murmur R01.1                  Syncope R55  History:         Patient has no prior history of Echocardiogram examinations.                  Risk Factors:Hypertension, Dyslipidemia and Sleep Apnea. CKD,                  pulmonary embolism.  Sonographer:     Sherrie Sport Referring Phys:  [id number] Patrecia Pour Diagnosing Phys: Nelva Bush MD  Sonographer Comments: Suboptimal apical window. IMPRESSIONS  1. Left ventricular ejection fraction, by estimation, is 55 to 60%. The left ventricle has normal function. Left ventricular endocardial border not optimally defined to evaluate regional wall motion. There is mild left ventricular hypertrophy. Left ventricular  diastolic parameters are consistent with Grade I diastolic dysfunction (impaired relaxation).  2. Right ventricular systolic function is normal. The right ventricular size is normal.  3. A small pericardial effusion is present. The pericardial effusion is circumferential.  4. The mitral valve is degenerative. Trivial mitral valve regurgitation. No evidence of mitral stenosis.  5. The aortic valve was not well visualized. Aortic valve regurgitation is not visualized. No aortic stenosis is present. FINDINGS  Left Ventricle: Left ventricular ejection fraction, by estimation, is 55 to 60%. The left ventricle has normal function. Left ventricular endocardial border not optimally defined to evaluate regional wall motion. The left ventricular internal cavity size was normal in size. There is mild left ventricular hypertrophy. Left ventricular diastolic parameters are consistent with Grade I diastolic dysfunction (impaired relaxation). Right Ventricle: The right ventricular size is normal. No increase in right ventricular wall thickness. Right ventricular systolic function is normal. Left Atrium: Left atrial size was normal in size. Right Atrium: Right atrial size was normal in size. Pericardium: A small pericardial effusion is present. The pericardial effusion is circumferential. Mitral Valve: The mitral valve is degenerative in appearance. There is mild thickening of the mitral valve leaflet(s). Mild mitral annular calcification. Trivial mitral valve regurgitation. No evidence of mitral valve stenosis. MV peak gradient, 4.2 mmHg. The mean mitral valve  gradient is 2.0 mmHg. Tricuspid Valve: The tricuspid valve is normal in structure. Tricuspid valve regurgitation is trivial. Aortic Valve: The aortic valve was not well visualized. Aortic valve regurgitation is not visualized. No aortic stenosis is present. Aortic valve mean gradient measures 4.0 mmHg. Aortic valve peak gradient measures 7.4 mmHg. Aortic valve area, by VTI  measures 2.65 cm. Pulmonic Valve: The pulmonic valve was not well visualized. Aorta: The aortic root is normal in size and structure. Pulmonary Artery: The pulmonary artery is not well seen. IAS/Shunts: The interatrial septum was not well visualized.  LEFT VENTRICLE PLAX 2D LVIDd:         4.60 cm   Diastology LVIDs:         2.80 cm   LV e' medial:    6.64 cm/s LV PW:         1.20 cm   LV E/e' medial:  9.7 LV IVS:        0.90 cm   LV e' lateral:   9.14 cm/s LVOT diam:     2.00 cm   LV E/e' lateral: 7.0 LV SV:         60 LV SV Index:   32 LVOT Area:     3.14 cm  RIGHT VENTRICLE RV Basal diam:  3.30 cm RV S prime:     16.10 cm/s TAPSE (M-mode): 2.2 cm LEFT ATRIUM           Index        RIGHT ATRIUM           Index LA diam:      3.60 cm 1.91 cm/m   RA Area:     17.59 cm LA Vol (A4C): 35.7 ml 18.98 ml/m  RA Volume:   51.71 ml  27.49 ml/m  AORTIC VALVE AV Area (Vmax):    1.91 cm AV Area (Vmean):   1.71 cm AV Area (VTI):     2.65 cm AV Vmax:           136.00 cm/s AV Vmean:          87.300 cm/s AV VTI:            0.228 m AV Peak Grad:      7.4 mmHg AV Mean Grad:      4.0 mmHg LVOT Vmax:         82.90 cm/s LVOT Vmean:        47.400 cm/s LVOT VTI:          0.192 m LVOT/AV VTI ratio: 0.84  AORTA Ao Root diam: 2.93 cm MITRAL VALVE               TRICUSPID VALVE MV Area (PHT): 3.50 cm    TR Peak grad:   17.6 mmHg MV Area VTI:   2.94 cm    TR Vmax:        210.00 cm/s MV Peak grad:  4.2 mmHg MV Mean grad:  2.0 mmHg    SHUNTS MV Vmax:       1.03 m/s    Systemic VTI:  0.19 m MV Vmean:      63.9 cm/s   Systemic Diam: 2.00 cm MV Decel Time: 217 msec MV E velocity: 64.30 cm/s MV A velocity: 88.30 cm/s MV E/A ratio:  0.73 Harrell Gave End MD Electronically signed by Nelva Bush MD Signature Date/Time: 06/28/2021/8:44:22 AM    Final       Impression / Plan:   Ann Russell is a  81 y.o. y/o female noted to the hospital after an accidental fall and found to have a fracture of the left fibula the distal aspect along with  metatarsal.  No overt blood loss noted.  Incidental finding of a hemoglobin of 4.9 g this morning.  Mild elevation in BUN/creatinine ratio.  D  Usually if 1 has lost over 8 g of blood from the GI tract would need to have melena.   Ct scan of the abdomen shows no intra abdominal or retroperitoneal bleed.   My suspicion is that its a lab error and would recommend to repeat the Hemoglobin, if still low then will consider EGD once Hb > 7 grams .    Thank you for involving me in the care of this patient.      LOS: 1 day   [name], MD  06/28/2021, 1:13 PM

## 2021-06-28 NOTE — Plan of Care (Signed)
Patient admitted to SDU today with low HGB. Receiving 2 units of PRBC this evening/tonight. VSS. ? ? ?Problem: Education: ?Goal: Knowledge of General Education information will improve ?Description: Including pain rating scale, medication(s)/side effects and non-pharmacologic comfort measures ?Outcome: Progressing ?  ?Problem: Health Behavior/Discharge Planning: ?Goal: Ability to manage health-related needs will improve ?Outcome: Progressing ?  ?Problem: Clinical Measurements: ?Goal: Ability to maintain clinical measurements within normal limits will improve ?Outcome: Progressing ?Goal: Will remain free from infection ?Outcome: Progressing ?Goal: Diagnostic test results will improve ?Outcome: Progressing ?Goal: Respiratory complications will improve ?Outcome: Progressing ?Goal: Cardiovascular complication will be avoided ?Outcome: Progressing ?  ?Problem: Activity: ?Goal: Risk for activity intolerance will decrease ?Outcome: Progressing ?  ?Problem: Nutrition: ?Goal: Adequate nutrition will be maintained ?Outcome: Progressing ?  ?Problem: Coping: ?Goal: Level of anxiety will decrease ?Outcome: Progressing ?  ?Problem: Elimination: ?Goal: Will not experience complications related to bowel motility ?Outcome: Progressing ?Goal: Will not experience complications related to urinary retention ?Outcome: Progressing ?  ?Problem: Pain Managment: ?Goal: General experience of comfort will improve ?Outcome: Progressing ?  ?Problem: Safety: ?Goal: Ability to remain free from injury will improve ?Outcome: Progressing ?  ?Problem: Skin Integrity: ?Goal: Risk for impaired skin integrity will decrease ?Outcome: Progressing ?  ?

## 2021-06-29 ENCOUNTER — Encounter: Payer: Self-pay | Admitting: Internal Medicine

## 2021-06-29 ENCOUNTER — Encounter
Admission: EM | Disposition: A | Discharge status: Home Health | Payer: Self-pay | Source: Home / Self Care | Attending: Internal Medicine

## 2021-06-29 ENCOUNTER — Inpatient Hospital Stay: Payer: Medicare PPO | Admitting: Certified Registered"

## 2021-06-29 ENCOUNTER — Inpatient Hospital Stay: Payer: Medicare PPO

## 2021-06-29 DIAGNOSIS — D649 Anemia, unspecified: Secondary | ICD-10-CM

## 2021-06-29 DIAGNOSIS — I951 Orthostatic hypotension: Secondary | ICD-10-CM | POA: Diagnosis not present

## 2021-06-29 DIAGNOSIS — S82832A Other fracture of upper and lower end of left fibula, initial encounter for closed fracture: Secondary | ICD-10-CM | POA: Diagnosis not present

## 2021-06-29 DIAGNOSIS — G629 Polyneuropathy, unspecified: Secondary | ICD-10-CM | POA: Diagnosis not present

## 2021-06-29 DIAGNOSIS — N390 Urinary tract infection, site not specified: Secondary | ICD-10-CM | POA: Diagnosis not present

## 2021-06-29 HISTORY — PX: ESOPHAGOGASTRODUODENOSCOPY (EGD) WITH PROPOFOL: SHX5813

## 2021-06-29 LAB — CBC
HCT: 23.9 % — ABNORMAL LOW (ref 36.0–46.0)
Hemoglobin: 8.1 g/dL — ABNORMAL LOW (ref 12.0–15.0)
MCH: 30.5 pg (ref 26.0–34.0)
MCHC: 33.9 g/dL (ref 30.0–36.0)
MCV: 89.8 fL (ref 80.0–100.0)
Platelets: 157 10*3/uL (ref 150–400)
RBC: 2.66 MIL/uL — ABNORMAL LOW (ref 3.87–5.11)
RDW: 18.5 % — ABNORMAL HIGH (ref 11.5–15.5)
WBC: 6.9 10*3/uL (ref 4.0–10.5)
nRBC: 0 % (ref 0.0–0.2)

## 2021-06-29 LAB — TYPE AND SCREEN
ABO/RH(D): A POS
Antibody Screen: NEGATIVE
Unit division: 0
Unit division: 0

## 2021-06-29 LAB — BASIC METABOLIC PANEL
Anion gap: 4 — ABNORMAL LOW (ref 5–15)
BUN: 63 mg/dL — ABNORMAL HIGH (ref 8–23)
CO2: 24 mmol/L (ref 22–32)
Calcium: 8 mg/dL — ABNORMAL LOW (ref 8.9–10.3)
Chloride: 112 mmol/L — ABNORMAL HIGH (ref 98–111)
Creatinine, Ser: 1.26 mg/dL — ABNORMAL HIGH (ref 0.44–1.00)
GFR, Estimated: 43 mL/min — ABNORMAL LOW (ref >60)
Glucose, Bld: 90 mg/dL (ref 70–99)
Potassium: 3.9 mmol/L (ref 3.5–5.1)
Sodium: 140 mmol/L (ref 135–145)

## 2021-06-29 LAB — BPAM RBC
Blood Product Expiration Date: 202303212359
Blood Product Expiration Date: 202303212359
ISSUE DATE / TIME: 202303071640
ISSUE DATE / TIME: 202303071947
Unit Type and Rh: 6200
Unit Type and Rh: 6200

## 2021-06-29 LAB — HAPTOGLOBIN: Haptoglobin: 128 mg/dL (ref 42–346)

## 2021-06-29 SURGERY — ESOPHAGOGASTRODUODENOSCOPY (EGD) WITH PROPOFOL
Anesthesia: General

## 2021-06-29 MED ORDER — IOHEXOL 9 MG/ML PO SOLN: 1000.0000 mL | Freq: Once | ORAL | Status: DC | PRN

## 2021-06-29 MED ORDER — PROPOFOL 500 MG/50ML IV EMUL
INTRAVENOUS | Status: DC | PRN
  Administered 2021-06-29: 120 ug/kg/min via INTRAVENOUS

## 2021-06-29 MED ORDER — SODIUM CHLORIDE (PF) 0.9 % IJ SOLN
PREFILLED_SYRINGE | INTRAMUSCULAR | Status: DC | PRN
  Administered 2021-06-29: 3 mL

## 2021-06-29 MED ORDER — PROPOFOL 10 MG/ML IV BOLUS
INTRAVENOUS | Status: DC | PRN
  Administered 2021-06-29: 50 mg via INTRAVENOUS

## 2021-06-29 MED ORDER — FENTANYL CITRATE PF 50 MCG/ML IJ SOSY
PREFILLED_SYRINGE | INTRAMUSCULAR | Status: AC
  Administered 2021-06-29: 50 ug
  Filled 2021-06-29: qty 1

## 2021-06-29 MED ORDER — IOHEXOL 300 MG/ML  SOLN
80.0000 mL | Freq: Once | INTRAMUSCULAR | Status: AC | PRN
  Administered 2021-06-29: 80 mL via INTRAVENOUS

## 2021-06-29 MED ORDER — ONDANSETRON HCL 4 MG/2ML IJ SOLN
INTRAMUSCULAR | Status: AC
  Filled 2021-06-29: qty 2

## 2021-06-29 MED ORDER — SODIUM CHLORIDE 0.9 % IV SOLN: INTRAVENOUS | Status: DC

## 2021-06-29 MED ORDER — FENTANYL CITRATE PF 50 MCG/ML IJ SOSY: 25.0000 ug | PREFILLED_SYRINGE | Freq: Once | INTRAMUSCULAR | Status: AC

## 2021-06-29 MED ORDER — EPINEPHRINE 1 MG/10ML IJ SOSY
PREFILLED_SYRINGE | INTRAMUSCULAR | Status: AC
  Filled 2021-06-29: qty 10

## 2021-06-29 MED ORDER — LIDOCAINE 2% (20 MG/ML) 5 ML SYRINGE
INTRAMUSCULAR | Status: DC | PRN
  Administered 2021-06-29: 20 mg via INTRAVENOUS

## 2021-06-29 NOTE — Progress Notes (Signed)
Patient s/p procedure in ENDO ?Patient awake and alert ?Patient c/o abd discomfort ?Vicente Males MD into assess ?Piscitello MD notified see MAR for full details ?Report called to RN ICU ? ?

## 2021-06-29 NOTE — Op Note (Signed)
[facility] ?Gastroenterology ?Patient Name: Ann Russell ?Procedure Date: 06/29/2021 11:00 AM ?MRN: [id number] ?Account #: [id number] ?Date of Birth: 31-May-1940 ?Admit Type: Inpatient ?Age: 81 ?Room: [facility] ENDO ROOM 3 ?Gender: Female ?Note Status: Finalized ?Instrument Name: Upper Endoscope [id number] ?Procedure:             Upper GI endoscopy ?Indications:           Acute post hemorrhagic anemia ?Providers:             [name] MD, MD ?Referring MD:          Jearld Fenton (Referring MD) ?Medicines:             Monitored Anesthesia Care ?Complications:         No immediate complications. ?Procedure:             Pre-Anesthesia Assessment: ?                       - Prior to the procedure, a History and Physical was  ?                       performed, and patient medications, allergies and  ?                       sensitivities were reviewed. The patient's tolerance  ?                       of previous anesthesia was reviewed. ?                       - The risks and benefits of the procedure and the  ?                       sedation options and risks were discussed with the  ?                       patient. All questions were answered and informed  ?                       consent was obtained. ?                       - After reviewing the risks and benefits, the patient  ?                       was deemed in satisfactory condition to undergo the  ?                       procedure. ?                       - ASA Grade Assessment: III - A patient with severe  ?                       systemic disease. ?                       After obtaining informed consent, the endoscope was  ?                       passed under direct  vision. Throughout the procedure,  ?                       the patient's blood pressure, pulse, and oxygen  ?                       saturations were monitored continuously. The Endoscope  ?                       was introduced through the mouth, and advanced to the  ?                        jejunum. The upper GI endoscopy was accomplished with  ?                       ease. The patient tolerated the procedure well. ?Findings: ?     The esophagus was normal. ?     The examined jejunum was normal. ?     Evidence of a Roux-en-Y gastrojejunostomy was found. The gastrojejunal  ?     anastomosis was characterized by erythema, friable mucosa and  ?     ulceration. This was traversed. ?     Two non-bleeding cratered gastric ulcers with a visible vessel were  ?     found at the anastomosis. The largest lesion was 15 mm in largest  ?     dimension. Area was successfully injected with 3 mL of a 1:10,000  ?     solution of epinephrine for hemostasis. Coagulation for hemostasis using  ?     heater probe was successful. The ulcer was at the [place] anastamosis , the  ?     entire area was very friable and erythematous and oozed when on conatct ?Impression:            - Normal esophagus. ?                       - Normal examined jejunum. ?                       - Roux-en-Y gastrojejunostomy with gastrojejunal  ?                       anastomosis characterized by erythema, friable mucosa  ?                       and ulceration. ?                       - Non-bleeding gastric ulcers with a visible vessel.  ?                       Injected. Treated with a heater probe. ?                       - No specimens collected. ?Recommendation:        - Return patient to hospital ward for ongoing care. ?                       - 1. NPO ?  2. IV PPI GTT for 72 hours ?                       3. Monitor CBC and transfuse as needed ?                       4. Suspect chronic blood loss from ulcers at  ?                       anastamosis and possibly acute recent bleed. ?                       5. Repeat EGD in 8 weeks to confirm healing ?Procedure Code(s):     --- Professional --- ?                       44315, Esophagogastroduodenoscopy, flexible,  ?                       transoral; with control of bleeding, any  method ?Diagnosis Code(s):     --- Professional --- ?                       Z98.0, Intestinal bypass and anastomosis status ?                       K25.4, Chronic or unspecified gastric ulcer with  ?                       hemorrhage ?                       D62, Acute posthemorrhagic anemia ?CPT copyright 2019 [employer]. All rights reserved. ?The codes documented in this report are preliminary and upon coder review may  ?be revised to meet current compliance requirements. ?[name], MD ?[name] MD, MD ?06/29/2021 11:32:23 AM ?This report has been signed electronically. ?Number of Addenda: 0 ?Note Initiated On: 06/29/2021 11:00 AM ?Estimated Blood Loss:  Estimated blood loss: none. ?     [facility] ?

## 2021-06-29 NOTE — Progress Notes (Signed)
[facility] Progress Note ? ?Patient: Ann Russell    CMK:[id number]  DOA: 06/25/2021    ?Date of Service: the patient was seen and examined on 06/29/2021 ? 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  Patient also found to have an incidental urinary tract infection.  Posterior splint applied and patient seen by podiatry who recommended boot and partial weightbearing with no need for surgery at this time.  Patient started on IV antibiotics for urinary infection.  She had been noted to have episodes of significant orthostatic hypotension and frequent falls at home.   ? ?On 3/7, patient noted to suddenly have symptomatic significant anemia with a drop to 4 and once confirmed, patient underwent transfusion.  CT of abdomen pelvis unremarkable and GI consulted who took patient for EGD on 3/8.  EGD noted ulcer at [place] anastomosis from previous gastric bypass which was treated with epinephrine and heat.  Suspected that this was having chronic bleeding while on Eliquis with addition of acute bleed.   ? ?Assessment and Plan: ?Assessment and Plan: ?* UTI (urinary tract infection) ?Urine culture without growth. Completed 3 doses of ceftriaxone ? ?Symptomatic anemia ?7 drop in hemoglobin felt to be secondary to bleeding ulcer from [place] anastomosis both acute and chronic.  Patient has been on Eliquis.  Appreciate gastroenterology help.  Ulcer cauterized and treated with epinephrine.  Continue IV PPI drip for 72 hours.  Start on clears this evening.  Will discuss with GI about when to restart Eliquis. ? ?Closed  fibular fracture ?On the left. Nondisplaced. Closed. Also with nondisplaced proximal 3rd metatarsal fracture.  ?- Discussed with Dr. Vickki Muff, podiatry, who confirms nonoperative management plan. Recommends trial of partial weight bearing (25-50%) LLE with CAM boot which has been applied. No need for splint. ?Physical therapy to see above they will likely recommended short-term skilled nursing.  Patient lives at home with her boyfriend.  She tells me however that she does not want to go to skilled nursing and she also tells me that even prior to this fall she is only minimally ambulated due to bad peripheral neuropathy.  She would like to go home with home health physical therapy and I suspect that she will become almost fully nonambulatory which she understands and he is okay with. ? ?Frequent falls ?Likely multifactorial related to hypotension, neuropathy, physical deconditioning, UTI.  We will continue treating orthostatic hypotension.  Refuses skilled nursing, see below. ? ?Hypotension ?BP with systolic in the 56P on arrival. Also several recent ED trips for syncope PTA. Persistent orthostasis here despite adequate IV fluid resuscitation. Echo performed today shows preserved LVEF 55-60%, G1DD associated with mild LVH, small circumferential pericardial effusion without mention of diastolic collapse/tamponade.  ?- Started TED hose, midodrine.  Bleeding may have played a partial role in this. ? ?Neuropathy ?Uses assistive devices, wheelchair, scooter, walker ? ?Stage 3b chronic kidney disease ([place]) ?Presented with AKI with creatinine 1.75, now down to 1.14.  ?- Avoid nephrotoxins. ? ?Demand ischemia ([place]) ?Mild troponin elevation is down-trending without anginal symptoms or ischemic ECG changes. No definite RWMA on suboptimal echo.  ?- No further intervention  currently planned. ? ?Hypertension ?Currently having severe orthostatic hypotension. Not on anti-HTN medications PTA. ? ?Neurological deficit present ?Reports  impaired sensation in fingers bilaterally. No neck pain or other evidence of radiculopathy. CT head ordered and reviewed which shows no bleed or other acute changes. If symptoms persist despite normalization of blood pressure and clinical stability, would pursue further work up.  ? ?History of pulmonary embolism ?- We have to hold eliquis for now. Note normal RV size/function on echo.  ? ? ? ? ? ? ?Body mass index is 25.01 kg/m?.  ?  ?   ? ?Consultants: ?Podiatry ?Gastroenterology ? ?Procedures: ?Status post EGD done 3/8 ? ?Antimicrobials: ?IV Rocephin x3 doses ? ?Code Status: Full code ? ? ?Subjective: Patient seen post EGD, feeling okay, fatigued, chronic neuropathy in her feet ? ?Objective: ?Vital signs were reviewed and unremarkable. ?Vitals:  ? 06/29/21 1500 06/29/21 1600  ?BP: (!) 144/98 (!) 144/65  ?Pulse: 66 64  ?Resp: 15 15  ?Temp:    ?SpO2: 100% 100%  ? ? ?Intake/Output Summary (Last 24 hours) at 06/29/2021 1622 ?Last data filed at 06/29/2021 1600 ?Gross per 24 hour  ?Intake 4004.75 ml  ?Output 1850 ml  ?Net 2154.75 ml  ? ?Filed Weights  ? 06/25/21 2224 06/26/21 2055  ?Weight: 68 kg 74.6 kg  ? ?Body mass index is 25.01 kg/m?. ? ?Exam: ? ?General: Alert and oriented x3, no acute distress ?HEENT: Normocephalic and atraumatic, mucous membranes are slightly dry ?Cardiovascular: Regular rate and rhythm, S1-S2 ?Respiratory: Clear to auscultation bilaterally ?Abdomen: Soft, nontender, nondistended, positive bowel sounds ?Musculoskeletal: 1+ pitting edema from the knees down bilaterally ?Skin: No skin breaks, tears or lesions ?Psychiatry: Appropriate, no evidence of psychoses ?Neurology: significantly decreased sensation in lower extremities due to neuropathy ? ?Data Reviewed: ?Creatinine down to 1.26.  Hemoglobin stable at 8.1. ? ?Disposition:  ?Status is: Inpatient ?Remains inpatient appropriate because: Confirming stabilization of bleeding ulcer ?  ? ?Family Communication: Left message for patient's boyfriend ?DVT  Prophylaxis: ?Place TED hose Start: 06/27/21 1239 ? ? ? ?Author: ?Annita Brod ,MD ?06/29/2021 4:22 PM ? ?To reach On-call, see care teams to locate the attending and reach out via [email or web address]. ?Between 7PM-7AM, please contact night-coverage ?If you still have difficulty reaching the attending provider, please page the [facility] (Director on Call) for [facility] on amion for assistance. ? ?

## 2021-06-29 NOTE — H&P (Signed)
? ? ? ?[name], MD ?[address] [place], [place], [place], [address] ?[address] [place], [place], [place], [address] ?Phone: [phone]  ?Fax: [phone] ? ?Primary Care Physician:  Jearld Fenton, NP ? ? ?Pre-Procedure History & Physical: ?HPI:  Ann Russell is a 81 y.o. female is here for an endoscopy  ?  ?Past Medical History:  ?Diagnosis Date  ? 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 Cancer [facility])   ? 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 Chronic kidney disease   ? [place] Kitchen  Takes Vasotec for kidneys.-stage 3  ? Depression   ? Hyperlipemia   ? Hypertension   ? h/o had gastric bypass and was able to come off meds  ? Neuropathy   ? Pulmonary embolism ([place])   ? Sleep apnea   ? no cpap since her gastric bypass  ? ? ?Past Surgical History:  ?Procedure Laterality Date  ? ABDOMINAL HYSTERECTOMY    ? Buniectomy    ? bil  ? CHOLECYSTECTOMY    ? FACIAL COSMETIC SURGERY  2011  ? GASTRIC BYPASS  2010  ? PULMONARY THROMBECTOMY Bilateral 06/22/2017  ? Procedure: PULMONARY THROMBECTOMY;  Surgeon: Algernon Huxley, MD;  Location: [facility];  Service: Cardiovascular;  Laterality: Bilateral;  ? Rotor cuff    ? right  ? SCLERAL BUCKLE  11/07/2011  ? Procedure: SCLERAL BUCKLE;  Surgeon: Hayden Pedro, MD;  Location: [facility];  Service: Ophthalmology;  Laterality: Left;  ? TOTAL KNEE ARTHROPLASTY Right 12/03/2018  ? Procedure: RIGHT TOTAL KNEE ARTHROPLASTY;  Surgeon: Hessie Knows, MD;  Location: [facility];  Service: Orthopedics;  Laterality: Right;  ? ? ?Prior to Admission medications   ?Medication Sig Start Date End Date Taking? Authorizing Provider  ?acetaminophen (TYLENOL) 650 MG CR tablet Take 650 mg by mouth 2 (two) times a day.   Yes [provider]  ?apixaban (ELIQUIS) 5 MG TABS tablet Take 1 tablet (5 mg total) by mouth 2 (two) times daily. 05/23/21  Yes Jearld Fenton, NP  ?B COMPLEX VITAMINS SL Place 1 tablet under the tongue daily.   Yes [provider]  ?busPIRone (BUSPAR) 15 MG tablet Take 1 tablet (15  mg total) by mouth 2 (two) times daily. 05/23/21  Yes Jearld Fenton, NP  ?calcitRIOL (ROCALTROL) 0.25 MCG capsule Take 1 capsule (0.25 mcg total) by mouth daily. 05/23/21  Yes Jearld Fenton, NP  ?Calcium Carb-Cholecalciferol (CALCIUM 600 + D PO) Take 1 tablet by mouth daily.   Yes [provider]  ?denosumab (PROLIA) 60 MG/ML SOSY injection Inject 60 mg into the skin every 6 (six) months. 12/14/20  Yes [provider]  ?ferrous [id number] C-folic acid (TRINSICON / FOLTRIN) capsule Take 1 capsule by mouth 2 (two) times daily. 12/06/18  Yes Duanne Guess, PA-C  ?FLUoxetine (PROZAC) 20 MG capsule Take 3 capsules (60 mg total) by mouth every morning. ?Patient taking differently: Take 20 mg by mouth 3 (three) times daily. 05/23/21  Yes Jearld Fenton, NP  ?Multiple Vitamin (MULTI-VITAMINS) TABS Take 2 tablets by mouth 2 (two) times daily.   Yes [provider]  ?omeprazole (PRILOSEC) 20 MG capsule Take 1 capsule (20 mg total) by mouth daily. 06/23/21  Yes Jearld Fenton, NP  ?pregabalin (LYRICA) 100 MG capsule Take 1 capsule (100 mg total) by mouth 3 (three) times daily. 05/23/21 05/23/22 Yes Jearld Fenton, NP  ?Pyridoxine HCl (VITAMIN B6 PO) Take 1 tablet by  mouth daily. With b12   Yes [provider]  ?valACYclovir (VALTREX) 1000 MG tablet Take 1 tablet (1,000 mg total) by mouth daily as needed (fever blisters). 05/23/21  Yes Jearld Fenton, NP  ?bisacodyl (DULCOLAX) 5 MG EC tablet Take 1 tablet (5 mg total) by mouth daily as needed for moderate constipation. ?Patient not taking: Reported on 06/26/2021 12/06/18   Duanne Guess, PA-C  ?HYDROcodone-acetaminophen (NORCO/VICODIN) 5-325 MG tablet Take 1 tablet by mouth every 8 (eight) hours as needed for moderate pain. ?Patient not taking: Reported on 06/26/2021 06/23/21   Jearld Fenton, NP  ? ? ?Allergies as of 06/25/2021  ? (No Known Allergies)  ? ? ?Family History  ?Problem Relation Age of Onset  ? Heart disease Mother   ?  Dementia Mother   ? Neuropathy Father   ? Diabetes Brother   ? Breast cancer Neg Hx   ? ? ?Social History  ? ?Socioeconomic History  ? Marital status: Significant Other  ?  Spouse name: Not on file  ? Number of children: 1  ? Years of education: Not on file  ? Highest education level: Not on file  ?Occupational History  ? Occupation: retired  ?Tobacco Use  ? Smoking status: Never  ? Smokeless tobacco: Never  ?Vaping Use  ? Vaping Use: Never used  ?Substance and Sexual Activity  ? Alcohol use: No  ? Drug use: No  ? Sexual activity: Not Currently  ?Other Topics Concern  ? Not on file  ?Social History Narrative  ? Not on file  ? ?Social Determinants of Health  ? ?Financial Resource Strain: Not on file  ?Food Insecurity: Not on file  ?Transportation Needs: Not on file  ?Physical Activity: Not on file  ?Stress: Not on file  ?Social Connections: Not on file  ?Intimate Partner Violence: Not on file  ? ? ?Review of Systems: ?See HPI, otherwise negative ROS ? ?Physical Exam: ?BP (!) 151/64   Pulse 99   Temp 98.1 ?F (36.7 ?C) (Oral)   Resp 14   Ht '5\' 8"'$  (1.727 m)   Wt 74.6 kg   SpO2 90%   BMI 25.01 kg/m?  ?General:   Alert,  pleasant and cooperative in NAD ?Head:  Normocephalic and atraumatic. ?Neck:  Supple; no masses or thyromegaly. ?Lungs:  Clear throughout to auscultation, normal respiratory effort.    ?Heart:  +S1, +S2, Regular rate and rhythm, No edema. ?Abdomen:  Soft, nontender and nondistended. Normal bowel sounds, without guarding, and without rebound.   ?Neurologic:  Alert and  oriented x4;  grossly normal neurologically. ? ?Impression/Plan: ?Ann Russell is here for an endoscopy  to be performed for  evaluation of anemia ?   ?Risks, benefits, limitations, and alternatives regarding endoscopy have been reviewed with the patient.  Questions have been answered.  All parties agreeable. ? ? ?[name], MD  06/29/2021, 10:55 AM ? ?

## 2021-06-29 NOTE — Hospital Course (Addendum)
81 year old female with past medical history of PE status post thrombectomy on Eliquis plus stage IIIb chronic kidney disease, hypertension and advanced peripheral neuropathy who presented to the emergency room on 3/4 after having mechanical fall and presented to the emergency room with left ankle pain and unable to bear weight and found to have a nondisplaced closed fracture of the distal left fibula and proximal left third metatarsal.  Patient also found to have an incidental urinary tract infection.  Posterior splint applied and patient seen by podiatry who recommended boot and partial weightbearing with no need for surgery at this time.  Patient started on IV antibiotics for urinary infection.  She had been noted to have episodes of significant orthostatic hypotension and frequent falls at home.   ? ?On 3/7, patient noted to suddenly have symptomatic significant anemia with a drop to 4 and once confirmed, patient underwent transfusion.  CT of abdomen pelvis unremarkable and GI consulted who took patient for EGD on 3/8.  EGD noted ulcer at [place] anastomosis from previous gastric bypass which was treated with epinephrine and heat.  Suspected that this was having chronic bleeding while on Eliquis with addition of acute bleed.  Eliquis now on hold.  Patient is near nonambulatory status even prior to this fall and so now that she is off of blood thinner, concerned about high risk for DVT.  Interventional radiology consulted and patient had IVC filter placed on 3/10.  On morning of 3/11, patient mildly hypotensive and hemoglobin which was 7.2-day prior, and then at 6.7.  Posttransfusion labs same day, hemoglobin 8.9 and have not changed in over 36 hours. ?

## 2021-06-29 NOTE — Anesthesia Postprocedure Evaluation (Signed)
Anesthesia Post Note ? ?Patient: Ann Russell ? ?Procedure(s) Performed: ESOPHAGOGASTRODUODENOSCOPY (EGD) WITH PROPOFOL ? ?Patient location during evaluation: Endoscopy ?Anesthesia Type: General ?Level of consciousness: awake and alert ?Pain management: pain level controlled ?Vital Signs Assessment: post-procedure vital signs reviewed and stable ?Respiratory status: spontaneous breathing, nonlabored ventilation, respiratory function stable and patient connected to nasal cannula oxygen ?Cardiovascular status: blood pressure returned to baseline and stable ?Postop Assessment: no apparent nausea or vomiting ?Anesthetic complications: no ? ? ?No notable events documented. ? ? ?Last Vitals:  ?Vitals:  ? 06/29/21 1156 06/29/21 1226  ?BP: (!) 169/66 (!) 155/63  ?Pulse: 66 65  ?Resp: 16 15  ?Temp:  36.6 ?C  ?SpO2: 100% 96%  ?  ?Last Pain:  ?Vitals:  ? 06/29/21 1305  ?TempSrc:   ?PainSc: 9   ? ? ?  ?  ?  ?  ?  ?  ? ?Precious Haws [place] ? ? ? ? ?

## 2021-06-29 NOTE — Transfer of Care (Signed)
Immediate Anesthesia Transfer of Care Note ? ?Patient: Ann Russell ? ?Procedure(s) Performed: ESOPHAGOGASTRODUODENOSCOPY (EGD) WITH PROPOFOL ? ?Patient Location: Endoscopy Unit ? ?Anesthesia Type:General ? ?Level of Consciousness: sedated ? ?Airway & Oxygen Therapy: Patient Spontanous Breathing ? ?Post-op Assessment: Report given to RN and Post -op Vital signs reviewed and stable ? ?Post vital signs: Reviewed ? ?Last Vitals:  ?Vitals Value Taken Time  ?BP    ?Temp    ?Pulse    ?Resp    ?SpO2    ? ? ?Last Pain:  ?Vitals:  ? 06/29/21 1050  ?TempSrc: Temporal  ?PainSc:   ?   ? ?  ? ?Complications: No notable events documented. ?

## 2021-06-29 NOTE — Anesthesia Preprocedure Evaluation (Signed)
Anesthesia Evaluation  ?Patient identified by MRN, date of birth, ID band ?Patient awake ? ? ? ?Reviewed: ?Allergy & Precautions, NPO status , Patient's Chart, lab work & pertinent test results ? ?History of Anesthesia Complications ?Negative for: history of anesthetic complications ? ?Airway ?Mallampati: III ? ?TM Distance: <3 FB ?Neck ROM: full ? ? ? Dental ? ?(+) Chipped, Poor Dentition, Missing ?  ?Pulmonary ?neg shortness of breath, sleep apnea ,  ?  ?Pulmonary exam normal ? ? ? ? ? ? ? Cardiovascular ?Exercise Tolerance: Good ?hypertension, (-) anginaNormal cardiovascular exam ? ? ?  ?Neuro/Psych ?PSYCHIATRIC DISORDERS negative neurological ROS ?   ? GI/Hepatic ?negative GI ROS, Neg liver ROS, neg GERD  ,  ?Endo/Other  ?negative endocrine ROS ? Renal/GU ?Renal disease  ?negative genitourinary ?  ?Musculoskeletal ? ? Abdominal ?  ?Peds ? Hematology ? ?(+) Blood dyscrasia, anemia ,   ?Anesthesia Other Findings ?Past Medical History: ?No date: Arthritis ?    Comment:  hands ?No date: Cancer [facility]) ?    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    Comment:  .  Takes Vasotec for kidneys.-stage 3 ?No date: Depression ?No date: Hyperlipemia ?No date: Hypertension ?    Comment:  h/o had gastric bypass and was able to come off meds ?No date: Neuropathy ?No date: Pulmonary embolism ([place]) ?No date: Sleep apnea ?    Comment:  no cpap since her gastric bypass ? ?Past Surgical History: ?No date: ABDOMINAL HYSTERECTOMY ?No date: Buniectomy ?    Comment:  bil ?No date: CHOLECYSTECTOMY ?2011: FACIAL COSMETIC SURGERY ?2010: GASTRIC BYPASS ?06/22/2017: PULMONARY THROMBECTOMY; Bilateral ?    Comment:  Procedure: PULMONARY THROMBECTOMY;  Surgeon: Leotis Pain  ?             S, MD;  Location: [place] [facility];  Service:  ?             Cardiovascular;  Laterality: Bilateral; ?No date: Rotor cuff ?    Comment:  right ?11/07/2011: SCLERAL BUCKLE ?    Comment:   Procedure: SCLERAL BUCKLE;  Surgeon: Hayden Pedro,  ?             MD;  Location: [facility];  Service: Ophthalmology;   ?             Laterality: Left; ?12/03/2018: TOTAL KNEE ARTHROPLASTY; Right ?    Comment:  Procedure: RIGHT TOTAL KNEE ARTHROPLASTY;  Surgeon:  ?             Hessie Knows, MD;  Location: [facility];  Service:  ?             Orthopedics;  Laterality: Right; ? ?BMI   ? Body Mass Index: 25.01 kg/m?  ?  ? ? Reproductive/Obstetrics ?negative OB ROS ? ?  ? ? ? ? ? ? ? ? ? ? ? ? ? ?  ?  ? ? ? ? ? ? ? ? ?Anesthesia Physical ?Anesthesia Plan ? ?ASA: 3 ? ?Anesthesia Plan: General  ? ?Post-op Pain Management:   ? ?Induction: Intravenous ? ?PONV Risk Score and Plan: Propofol infusion and TIVA ? ?Airway Management Planned: Natural Airway and Nasal Cannula ? ?Additional Equipment:  ? ?Intra-op Plan:  ? ?Post-operative Plan:  ? ?Informed Consent: I have reviewed the patients History and Physical, chart, labs and discussed the procedure including the risks, benefits and alternatives for the proposed anesthesia with the patient or authorized  representative who has indicated his/her understanding and acceptance.  ? ? ? ?Dental Advisory Given ? ?Plan Discussed with: Anesthesiologist, CRNA and Surgeon ? ?Anesthesia Plan Comments: (Patient consented for risks of anesthesia including but not limited to:  ?- adverse reactions to medications ?- risk of airway placement if required ?- damage to eyes, teeth, lips or other oral mucosa ?- nerve damage due to positioning  ?- sore throat or hoarseness ?- Damage to heart, brain, nerves, lungs, other parts of body or loss of life ? ?Patient voiced understanding.)  ? ? ? ? ? ? ?Anesthesia Quick Evaluation ? ?

## 2021-06-30 ENCOUNTER — Encounter: Payer: Self-pay | Admitting: Gastroenterology

## 2021-06-30 DIAGNOSIS — N1831 Chronic kidney disease, stage 3a: Secondary | ICD-10-CM

## 2021-06-30 DIAGNOSIS — K250 Acute gastric ulcer with hemorrhage: Secondary | ICD-10-CM

## 2021-06-30 DIAGNOSIS — D649 Anemia, unspecified: Secondary | ICD-10-CM | POA: Diagnosis not present

## 2021-06-30 DIAGNOSIS — N390 Urinary tract infection, site not specified: Secondary | ICD-10-CM | POA: Diagnosis not present

## 2021-06-30 LAB — CBC
HCT: 22.8 % — ABNORMAL LOW (ref 36.0–46.0)
Hemoglobin: 7.6 g/dL — ABNORMAL LOW (ref 12.0–15.0)
MCH: 30.5 pg (ref 26.0–34.0)
MCHC: 33.3 g/dL (ref 30.0–36.0)
MCV: 91.6 fL (ref 80.0–100.0)
Platelets: 159 10*3/uL (ref 150–400)
RBC: 2.49 MIL/uL — ABNORMAL LOW (ref 3.87–5.11)
RDW: 19.6 % — ABNORMAL HIGH (ref 11.5–15.5)
WBC: 7.1 10*3/uL (ref 4.0–10.5)
nRBC: 0.6 % — ABNORMAL HIGH (ref 0.0–0.2)

## 2021-06-30 LAB — BASIC METABOLIC PANEL
Anion gap: 6 (ref 5–15)
BUN: 34 mg/dL — ABNORMAL HIGH (ref 8–23)
CO2: 22 mmol/L (ref 22–32)
Calcium: 8 mg/dL — ABNORMAL LOW (ref 8.9–10.3)
Chloride: 112 mmol/L — ABNORMAL HIGH (ref 98–111)
Creatinine, Ser: 1.02 mg/dL — ABNORMAL HIGH (ref 0.44–1.00)
GFR, Estimated: 56 mL/min — ABNORMAL LOW (ref >60)
Glucose, Bld: 132 mg/dL — ABNORMAL HIGH (ref 70–99)
Potassium: 3.6 mmol/L (ref 3.5–5.1)
Sodium: 140 mmol/L (ref 135–145)

## 2021-06-30 MED ORDER — SUCRALFATE 1 GM/10ML PO SUSP
1.0000 g | Freq: Three times a day (TID) | ORAL | Status: DC
  Administered 2021-06-30 – 2021-07-04 (×15): 1 g via ORAL
  Filled 2021-06-30 (×18): qty 10

## 2021-06-30 NOTE — Progress Notes (Signed)
Report to care nurse receiving pt, Christophe Louis RN ?

## 2021-06-30 NOTE — Progress Notes (Signed)
Ann Russell , MD [address], [place], [place], [place], [address] [address] [place], [place], [place], [address] Phone: [phone]  Fax: [phone]   Ann Russell is being followed for GI bleed  Day 2 of follow up   Subjective:  Denies any abdominal pain today.  No nausea no vomiting.  Not had a bowel movement.  Objective: Vital signs in last 24 hours: Vitals:   06/30/21 0500 06/30/21 0600 06/30/21 0700 06/30/21 0730  BP: (!) 131/96 137/69 138/88   Pulse: 61 63 66   Resp: '13 16 14   '$ Temp:    97.8 F (36.6 C)  TempSrc:    Oral  SpO2: 100% 99% 100%   Weight:      Height:       Weight change:   Intake/Output Summary (Last 24 hours) at 06/30/2021 1021 Last data filed at 06/30/2021 0800 Gross per 24 hour  Intake 907.4 ml  Output 2425 ml  Net -1517.6 ml     Exam: Heart:: Regular rate and rhythm Lungs: normal Abdomen: soft, nontender, normal bowel sounds   Lab Results: '@LABTEST2'$ @ Micro Results: Recent Results (from the past 240 hour(s))  Resp Panel by RT-PCR (Flu A&B, Covid) Nasopharyngeal Swab     Status: None   Collection Time: 06/23/21  5:14 PM   Specimen: Nasopharyngeal Swab; Nasopharyngeal(NP) swabs in vial transport medium  Result Value Ref Range Status   SARS Coronavirus 2 by RT PCR NEGATIVE NEGATIVE Final    Comment: (NOTE) SARS-CoV-2 target nucleic acids are NOT DETECTED.  The SARS-CoV-2 RNA is generally detectable in upper respiratory specimens during the acute phase of infection. The lowest concentration of SARS-CoV-2 viral copies this assay can detect is 138 copies/mL. A negative result does not preclude SARS-Cov-2 infection and should not be used as the sole basis for treatment or other patient management decisions. A negative result may occur with  improper specimen collection/handling, submission of specimen other than nasopharyngeal swab, presence of viral mutation(s) within the areas targeted by this assay, and inadequate number  of viral copies(<138 copies/mL). A negative result must be combined with clinical observations, patient history, and epidemiological information. The expected result is Negative.  Fact Sheet for Patients:  EntrepreneurPulse.com.au  Fact Sheet for Healthcare Providers:  IncredibleEmployment.be  This test is no t yet approved or cleared by the [place] FDA and  has been authorized for detection and/or diagnosis of SARS-CoV-2 by FDA under an Emergency Use Authorization (EUA). This EUA will remain  in effect (meaning this test can be used) for the duration of the COVID-19 declaration under Section 564(b)(1) of the Act, 21 U.S.C.section 360bbb-3(b)(1), unless the authorization is terminated  or revoked sooner.       Influenza A by PCR NEGATIVE NEGATIVE Final   Influenza B by PCR NEGATIVE NEGATIVE Final    Comment: (NOTE) The Xpert Xpress SARS-CoV-2/FLU/RSV plus assay is intended as an aid in the diagnosis of influenza from Nasopharyngeal swab specimens and should not be used as a sole basis for treatment. Nasal washings and aspirates are unacceptable for Xpert Xpress SARS-CoV-2/FLU/RSV testing.  Fact Sheet for Patients: EntrepreneurPulse.com.au  Fact Sheet for Healthcare Providers: IncredibleEmployment.be  This test is not yet approved or cleared by the [place] FDA and has been authorized for detection and/or diagnosis of SARS-CoV-2 by FDA under an Emergency Use Authorization (EUA). This EUA will remain in effect (meaning this test can be used) for the duration of the COVID-19 declaration under Section 564(b)(1)  of the Act, 21 U.S.C. section 360bbb-3(b)(1), unless the authorization is terminated or revoked.  Performed at [facility], [address] [place], [place] [address]   Urine Culture     Status: None   Collection Time: 06/26/21  2:57 AM   Specimen: Urine, Clean Catch   Result Value Ref Range Status   Specimen Description   Final    URINE, CLEAN CATCH Performed at [facility], [address], [place], [place] [address]    Special Requests   Final    NONE Performed at [facility], [address], [place], [place] [address]    Culture   Final    NO GROWTH Performed at [facility], [place] [address] [place], [place] [address]    Report Status 06/27/2021 FINAL  Final  Resp Panel by RT-PCR (Flu A&B, Covid) Nasopharyngeal Swab     Status: None   Collection Time: 06/26/21  4:18 AM   Specimen: Nasopharyngeal Swab; Nasopharyngeal(NP) swabs in vial transport medium  Result Value Ref Range Status   SARS Coronavirus 2 by RT PCR NEGATIVE NEGATIVE Final    Comment: (NOTE) SARS-CoV-2 target nucleic acids are NOT DETECTED.  The SARS-CoV-2 RNA is generally detectable in upper respiratory specimens during the acute phase of infection. The lowest concentration of SARS-CoV-2 viral copies this assay can detect is 138 copies/mL. A negative result does not preclude SARS-Cov-2 infection and should not be used as the sole basis for treatment or other patient management decisions. A negative result may occur with  improper specimen collection/handling, submission of specimen other than nasopharyngeal swab, presence of viral mutation(s) within the areas targeted by this assay, and inadequate number of viral copies(<138 copies/mL). A negative result must be combined with clinical observations, patient history, and epidemiological information. The expected result is Negative.  Fact Sheet for Patients:  EntrepreneurPulse.com.au  Fact Sheet for Healthcare Providers:  IncredibleEmployment.be  This test is no t yet approved or cleared by the [place] FDA and  has been authorized for detection and/or diagnosis of SARS-CoV-2 by FDA under an Emergency Use Authorization (EUA). This EUA will remain  in  effect (meaning this test can be used) for the duration of the COVID-19 declaration under Section 564(b)(1) of the Act, 21 U.S.C.section 360bbb-3(b)(1), unless the authorization is terminated  or revoked sooner.       Influenza A by PCR NEGATIVE NEGATIVE Final   Influenza B by PCR NEGATIVE NEGATIVE Final    Comment: (NOTE) The Xpert Xpress SARS-CoV-2/FLU/RSV plus assay is intended as an aid in the diagnosis of influenza from Nasopharyngeal swab specimens and should not be used as a sole basis for treatment. Nasal washings and aspirates are unacceptable for Xpert Xpress SARS-CoV-2/FLU/RSV testing.  Fact Sheet for Patients: EntrepreneurPulse.com.au  Fact Sheet for Healthcare Providers: IncredibleEmployment.be  This test is not yet approved or cleared by the [place] FDA and has been authorized for detection and/or diagnosis of SARS-CoV-2 by FDA under an Emergency Use Authorization (EUA). This EUA will remain in effect (meaning this test can be used) for the duration of the COVID-19 declaration under Section 564(b)(1) of the Act, 21 U.S.C. section 360bbb-3(b)(1), unless the authorization is terminated or revoked.  Performed at [facility], [address] [place],  [address]   MRSA Next Gen by PCR, Nasal     Status: None   Collection Time: 06/28/21  3:48 PM   Specimen: Nasal Mucosa; Nasal Swab  Result Value Ref Range Status  MRSA by PCR Next Gen NOT DETECTED NOT DETECTED Final    Comment: (NOTE) The GeneXpert MRSA Assay (FDA approved for NASAL specimens only), is one component of a comprehensive MRSA colonization surveillance program. It is not intended to diagnose MRSA infection nor to guide or monitor treatment for MRSA infections. Test performance is not FDA approved in patients less than 49 years old. Performed at [facility], [address] [place], [place] [address]    Studies/Results: CT  ABDOMEN PELVIS WO CONTRAST  Result Date: 06/28/2021 CLINICAL DATA:  Retro peritoneal bleed suspected. EXAM: CT ABDOMEN AND PELVIS WITHOUT CONTRAST TECHNIQUE: Multidetector CT imaging of the abdomen and pelvis was performed following the standard protocol without IV contrast. RADIATION DOSE REDUCTION: This exam was performed according to the departmental dose-optimization program which includes automated exposure control, adjustment of the mA and/or kV according to patient size and/or use of iterative reconstruction technique. COMPARISON:  06/23/2021 FINDINGS: Lower chest: Minimal scarring or atelectasis at the lung bases. Hepatobiliary: Liver parenchyma appears normal without contrast. Previous cholecystectomy. Pancreas: Normal Spleen: Normal Adrenals/Urinary Tract: Adrenal glands are normal. Renal cortical atrophy with multiple renal cysts. Nonobstructing 2 mm stones in the right kidney. No hydroureteronephrosis. The bladder is normal. Stomach/Bowel: Previous gastric surgery. Small bowel appears normal. No acute or significant: Finding. Diverticulosis without evidence of diverticulitis. Vascular/Lymphatic: Aortic atherosclerosis. No aneurysm. IVC is normal. No retroperitoneal adenopathy or retroperitoneal bleeding. Reproductive: Previous hysterectomy.  No pelvic mass. Other: No free fluid or air. Periumbilical ventral hernia containing only fat. Musculoskeletal: Chronic lumbar degenerative changes. IMPRESSION: No evidence of retroperitoneal or intraperitoneal hemorrhage. Aortic Atherosclerosis (ICD10-I70.0). Previous cholecystectomy. Previous hysterectomy. Previous gastric surgery. Electronically Signed   By: Nelson Chimes M.D.   On: 06/28/2021 15:03   CT HEAD WO CONTRAST (5MM)  Result Date: 06/28/2021 CLINICAL DATA:  Neuro deficit, acute, stroke suspected EXAM: CT HEAD WITHOUT CONTRAST TECHNIQUE: Contiguous axial images were obtained from the base of the skull through the vertex without intravenous contrast.  RADIATION DOSE REDUCTION: This exam was performed according to the departmental dose-optimization program which includes automated exposure control, adjustment of the mA and/or kV according to patient size and/or use of iterative reconstruction technique. COMPARISON:  June 23, 2021. FINDINGS: Brain: No evidence of acute large vascular territory infarction, hemorrhage, hydrocephalus, extra-axial collection or mass lesion/mass effect. Cerebral atrophy. Mild patchy white matter hypoattenuation, nonspecific but paddle with chronic microvascular ischemic disease. Vascular: No hyperdense vessel identified. Calcific intracranial atherosclerosis. Skull: No acute fracture. Sinuses/Orbits: Opacification of scattered ethmoid air cells. Otherwise, visualized sinuses are largely clear. Postsurgical changes of the left globe, partially imaged. Other: No mastoid effusions. IMPRESSION: 1. No evidence of acute intracranial abnormality by CT. MRI could provide more sensitive evaluation for acute infarct if clinically indicated. 2. Atrophy and chronic microvascular disease. Electronically Signed   By: Margaretha Sheffield M.D.   On: 06/28/2021 13:40   CT ABDOMEN W CONTRAST  Result Date: 06/29/2021 CLINICAL DATA:  Postop abdominal pain. Peritonitis suspected. endoscopy this morning. This found ulcers at the gastro/jejunal anastomosis. Evaluate perforation. EXAM: CT ABDOMEN WITH CONTRAST TECHNIQUE: Multidetector CT imaging of the abdomen was performed using the standard protocol following bolus administration of intravenous contrast. RADIATION DOSE REDUCTION: This exam was performed according to the departmental dose-optimization program which includes automated exposure control, adjustment of the mA and/or kV according to patient size and/or use of iterative reconstruction technique. CONTRAST:  24m OMNIPAQUE IOHEXOL 300 MG/ML  SOLN COMPARISON:  Yesterday's abdominopelvic CT. FINDINGS: Lower chest: Left base  scarring. Mild  cardiomegaly, with trace bilateral pleural effusions. Lad coronary artery calcification. Hepatobiliary: Normal liver. Cholecystectomy. Upper normal intrahepatic ducts. Normal common duct caliber. Pancreas: Pancreatic atrophy. No duct dilatation or acute inflammation. Spleen: Normal in size, without focal abnormality. Adrenals/Urinary Tract: Bilateral adrenal thickening, with maintenance of adreniform shape. Moderate renal cortical scarring bilaterally. Areas of retained cortex in both kidneys create "pseudotumors". There also too small to characterize low-density bilateral renal lesions which are likely cysts. Punctate right renal collecting system calculi. No hydronephrosis. Stomach/Bowel: Status post Roux-en-Y gastric bypass. Wall thickening of the gastric remnant including on 25/2 and adjacent jejunum on 30/2 may correspond to site of ulcers on endoscopy. Contrast in the bypassed stomach may be due to reflux versus less likely gastric/gastric fistula. Large colonic stool burden.  Otherwise normal small bowel. Vascular/Lymphatic: Aortic atherosclerosis. No retroperitoneal or retrocrural adenopathy. Other: No abdominal ascites.  No free intraperitoneal air. Musculoskeletal: Lumbar spondylosis. S shaped lumbar spine curvature. IMPRESSION: 1. No evidence of free intraperitoneal air. 2. Status post Roux-en-Y gastric bypass with wall thickening of the gastric remnant and adjacent jejunum, likely representing gastritis/enteritis and possibly sites of ulcers on today's endoscopy. 3. Right nephrolithiasis 4. Trace bilateral pleural effusions. 5. Coronary artery atherosclerosis. Aortic Atherosclerosis (ICD10-I70.0). Electronically Signed   By: Abigail Miyamoto M.D.   On: 06/29/2021 14:45   Medications: I have reviewed the patient's current medications. Scheduled Meds:  busPIRone  15 mg Oral BID   calcitRIOL  0.25 mcg Oral Daily   Chlorhexidine Gluconate Cloth  6 each Topical Daily   FLUoxetine  60 mg Oral BH-q7a    midodrine  5 mg Oral TID WC   [START ON 07/02/2021] pantoprazole  40 mg Intravenous Q12H   pregabalin  100 mg Oral TID   Continuous Infusions:  pantoprazole 8 mg/hr (06/30/21 0800)   PRN Meds:.acetaminophen **OR** acetaminophen, HYDROcodone-acetaminophen, iohexol, labetalol, ondansetron **OR** ondansetron (ZOFRAN) IV   Assessment: Principal Problem:   UTI (urinary tract infection) Active Problems:   Hypertension   Symptomatic anemia   History of pulmonary embolism   Neuropathy   Frequent falls   Stage 3b chronic kidney disease (HCC)   Closed fibular fracture   Hypotension   Demand ischemia (HCC)   Neurological deficit present   CBC Latest Ref Rng & Units 06/30/2021 06/29/2021 06/28/2021  WBC 4.0 - 10.5 K/uL 7.1 6.9 -  Hemoglobin 12.0 - 15.0 g/dL 7.6(L) 8.1(L) 8.3(L)  Hematocrit 36.0 - 46.0 % 22.8(L) 23.9(L) 24.7(L)  Platelets 150 - 400 K/uL 159 157 -    Plan: Ann Russell 81 y.o. female with history of gastric bypass Roux en Y, s/p fall and fibula #, incidentally found to have a drop in hb from 13 grams to 4.9 grams with no overt blood seen. S/p EGD on 06/30/2021 large ulcer at [place] anastamosis with visible vessel treated with Epinephrine and gold probe . Post procedure patient had abdominal pain , CT showed no perforation.   Plan  Monitor CBC and transfuse as needed  IV PPI GTT for 72 hours then omeprazole 40 mg Bid for 3 months.  We will add Carafate 4 times daily Repeat EGD in 8 weeks to check for healing of ulcer  IF has repeat bleeding then may need to consider surgical options vs repeat EGD and application of hemospray/Clips.  Check H. pylori serology   LOS: 3 days   Ann Bellows, MD 06/30/2021, 10:21 AM

## 2021-06-30 NOTE — Progress Notes (Signed)
[facility] Progress Note ? ?Patient: Ann Russell    QIW:[id number]  DOA: 06/25/2021    ?Date of Service: the patient was seen and examined on 06/30/2021 ? 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  Patient also found to have an incidental urinary tract infection.  Posterior splint applied and patient seen by podiatry who recommended boot and partial weightbearing with no need for surgery at this time.  Patient started on IV antibiotics for urinary infection.  She had been noted to have episodes of significant orthostatic hypotension and frequent falls at home.   ? ?On 3/7, patient noted to suddenly have symptomatic significant anemia with a drop to 4 and once confirmed, patient underwent transfusion.  CT of abdomen pelvis unremarkable and GI consulted who took patient for EGD on 3/8.  EGD noted ulcer at [place] anastomosis from previous gastric bypass which was treated with epinephrine and heat.  Suspected that this was having chronic bleeding while on Eliquis with addition of acute bleed.   ? ?Assessment and Plan: ?Assessment and Plan: ?* UTI (urinary tract infection) ?Urine culture without growth. Completed 3 doses of ceftriaxone ? ?Symptomatic anemia ?7 drop in hemoglobin felt to be secondary to bleeding ulcer from [place] anastomosis both acute and chronic.  Patient has been on Eliquis.  Appreciate gastroenterology help.  Ulcer cauterized and treated with epinephrine.  Continue IV PPI drip for 72 hours.  Diet advanced.  Will discuss with GI about when to restart Eliquis.  Continue PPI and  Carafate. ? ?Closed fibular fracture ?On the left. Nondisplaced. Closed. Also with nondisplaced proximal 3rd metatarsal fracture.  ?- Discussed with Dr. Vickki Muff, podiatry, who confirms nonoperative management plan. Recommends trial of partial weight bearing (25-50%) LLE with CAM boot which has been applied. No need for splint. ?Physical therapy to see above they will likely recommended short-term skilled nursing.  Patient lives at home with her boyfriend.  She tells me however that she does not want to go to skilled nursing and she also tells me that even prior to this fall she is only minimally ambulated due to bad peripheral neuropathy.  She would like to go home with home health physical therapy and I suspect that she will become almost fully nonambulatory which she understands and he is okay with. ? ?Frequent falls ?Likely multifactorial related to hypotension, neuropathy, physical deconditioning, UTI.  We will continue treating orthostatic hypotension.  Refuses skilled nursing, see below.  PT and OT to see. ? ?Hypotension ?BP with systolic in the 41D on arrival. Also several recent ED trips for syncope PTA. Persistent orthostasis here despite adequate IV fluid resuscitation. Echo performed today shows preserved LVEF 55-60%, G1DD associated with mild LVH, small circumferential pericardial effusion without mention of diastolic collapse/tamponade.  ?- Started TED hose, midodrine.  Bleeding may have played a partial role in this. ? ?Neuropathy ?Uses assistive devices, wheelchair, scooter, walker.  Given her now almost nonambulatory status and the fact that she is no longer on blood thinning medication (at least for the immediate future if not permanently), patient is at high risk for a blood clot.  Had discussion  with patient about possibility of IVC filter which she is considering.  Interventional radiology to meet with patient and discuss options. ? ?Chronic kidney disease, stage 3a ([place]) ?Presented with AKI with  creatinine 1.75, now down to 1.02 with GFR of 56 ? ?Demand ischemia ([place]) ?Mild troponin elevation is down-trending without anginal symptoms or ischemic ECG changes. No definite RWMA on suboptimal echo.  ?- No further intervention currently planned. ? ?Hypertension ?Currently having severe orthostatic hypotension. Not on anti-HTN medications PTA. ? ?Neurological deficit present ?Reports impaired sensation in fingers bilaterally. No neck pain or other evidence of radiculopathy. CT head ordered and reviewed which shows no bleed or other acute changes. If symptoms persist despite normalization of blood pressure and clinical stability, would pursue further work up.  ? ?History of pulmonary embolism ?- We have to hold eliquis for now. Note normal RV size/function on echo.  ? ? ? ? ? ? ?Body mass index is 25.01 kg/m?.  ?  ?   ? ?Consultants: ?Podiatry ?Gastroenterology ?Interventional radiology ? ?Procedures: ?Status post EGD done 3/8 ? ?Antimicrobials: ?IV Rocephin x3 doses ? ?Code Status: Full code ? ? ?Subjective: Patient doing okay, chronic lower extremity neuropathy pain ? ?Objective: ?Vital signs were reviewed and unremarkable. ?Vitals:  ? 06/30/21 1100 06/30/21 1200  ?BP: (!) 134/59 112/71  ?Pulse: (!) 55 62  ?Resp: (!) 8 10  ?Temp:  97.9 ?F (36.6 ?C)  ?SpO2: 100% 99%  ? ? ?Intake/Output Summary (Last 24 hours) at 06/30/2021 1507 ?Last data filed at 06/30/2021 0800 ?Gross per 24 hour  ?Intake 689.94 ml  ?Output 1625 ml  ?Net -935.06 ml  ? ? ?Filed Weights  ? 06/25/21 2224 06/26/21 2055  ?Weight: 68 kg 74.6 kg  ? ?Body mass index is 25.01 kg/m?. ? ?Exam: ? ?General: Alert and oriented x3, no acute distress ?HEENT: Normocephalic and atraumatic, mucous membranes are slightly dry ?Cardiovascular: Regular rate and rhythm, S1-S2 ?Respiratory: Clear to auscultation bilaterally ?Abdomen: Soft, nontender, nondistended, positive bowel sounds ?Musculoskeletal: 1+ pitting edema from the knees down bilaterally ?Skin: No skin  breaks, tears or lesions ?Psychiatry: Appropriate, no evidence of psychoses ?Neurology: significantly decreased sensation in lower extremities due to neuropathy ? ?Data Reviewed: ?Creatinine down to 1.26.  Hemoglobin stable at 8.1. ? ?Disposition:  ?Status is: Inpatient ?Remains inpatient appropriate because: Confirming stabilization of bleeding ulcer, needs to be on IV PPI drip for 2 more days plus consideration of IVC filter ?  ? ?Family Communication: Updated patient's daughter ?DVT Prophylaxis: ?Place TED hose Start: 06/27/21 1239 ? ? ? ?Author: ?Annita Brod ,MD ?06/30/2021 3:07 PM ? ?To reach On-call, see care teams to locate the attending and reach out via [email or web address]. ?Between 7PM-7AM, please contact night-coverage ?If you still have difficulty reaching the attending provider, please page the [facility] (Director on Call) for [facility] on amion for assistance. ? ?

## 2021-07-01 ENCOUNTER — Other Ambulatory Visit: Payer: Self-pay | Admitting: Radiology

## 2021-07-01 ENCOUNTER — Inpatient Hospital Stay: Payer: Medicare PPO | Admitting: Radiology

## 2021-07-01 DIAGNOSIS — N1831 Chronic kidney disease, stage 3a: Secondary | ICD-10-CM | POA: Diagnosis not present

## 2021-07-01 DIAGNOSIS — N390 Urinary tract infection, site not specified: Secondary | ICD-10-CM | POA: Diagnosis not present

## 2021-07-01 DIAGNOSIS — D649 Anemia, unspecified: Secondary | ICD-10-CM | POA: Diagnosis not present

## 2021-07-01 DIAGNOSIS — Z86711 Personal history of pulmonary embolism: Secondary | ICD-10-CM

## 2021-07-01 DIAGNOSIS — I951 Orthostatic hypotension: Secondary | ICD-10-CM | POA: Diagnosis not present

## 2021-07-01 DIAGNOSIS — K5900 Constipation, unspecified: Secondary | ICD-10-CM | POA: Diagnosis not present

## 2021-07-01 HISTORY — PX: IR IVC FILTER PLMT / S&I /IMG GUID/MOD SED: IMG701

## 2021-07-01 LAB — CBC
HCT: 22.4 % — ABNORMAL LOW (ref 36.0–46.0)
Hemoglobin: 7.2 g/dL — ABNORMAL LOW (ref 12.0–15.0)
MCH: 30.4 pg (ref 26.0–34.0)
MCHC: 32.1 g/dL (ref 30.0–36.0)
MCV: 94.5 fL (ref 80.0–100.0)
Platelets: 166 10*3/uL (ref 150–400)
RBC: 2.37 MIL/uL — ABNORMAL LOW (ref 3.87–5.11)
RDW: 20 % — ABNORMAL HIGH (ref 11.5–15.5)
WBC: 6.7 10*3/uL (ref 4.0–10.5)
nRBC: 0.3 % — ABNORMAL HIGH (ref 0.0–0.2)

## 2021-07-01 MED ORDER — MIDAZOLAM HCL 2 MG/2ML IJ SOLN
INTRAMUSCULAR | Status: AC
Start: 2021-07-01 — End: 2021-07-02
  Filled 2021-07-01: qty 4

## 2021-07-01 MED ORDER — IOHEXOL 350 MG/ML SOLN
45.0000 mL | Freq: Once | INTRAVENOUS | Status: AC | PRN
Start: 2021-07-01 — End: 2021-07-01
  Administered 2021-07-01: 45 mL via INTRAVENOUS

## 2021-07-01 MED ORDER — POLYETHYLENE GLYCOL 3350 17 G PO PACK
17.0000 g | PACK | Freq: Two times a day (BID) | ORAL | Status: DC
  Administered 2021-07-01 – 2021-07-04 (×7): 17 g via ORAL
  Filled 2021-07-01 (×7): qty 1

## 2021-07-01 MED ORDER — FENTANYL CITRATE (PF) 100 MCG/2ML IJ SOLN
INTRAMUSCULAR | Status: AC
  Filled 2021-07-01: qty 4

## 2021-07-01 MED ORDER — FENTANYL CITRATE (PF) 100 MCG/2ML IJ SOLN
INTRAMUSCULAR | Status: AC | PRN
Start: 2021-07-01 — End: 2021-07-01
  Administered 2021-07-01: 50 ug via INTRAVENOUS

## 2021-07-01 MED ORDER — SODIUM CHLORIDE 0.9 % IV SOLN
INTRAVENOUS | Status: DC
Start: 2021-07-01 — End: 2021-07-02

## 2021-07-01 MED ORDER — LIDOCAINE HCL 1 % IJ SOLN
INTRAMUSCULAR | Status: AC
  Administered 2021-07-01: 5 mL
  Filled 2021-07-01: qty 20

## 2021-07-01 MED ORDER — MIDAZOLAM HCL 2 MG/2ML IJ SOLN
INTRAMUSCULAR | Status: AC | PRN
  Administered 2021-07-01: 1 mg via INTRAVENOUS

## 2021-07-01 NOTE — Procedures (Signed)
Vascular and Interventional Radiology Procedure Note ? ?Patient: Ann Russell ?DOB: 08-Nov-1940 ?Medical Record Number: [id number] ?Note Date/Time: 07/01/21 3:45 PM  ? ?Performing Physician: Michaelle Birks, MD ?Assistant(s): None ? ?Diagnosis: Hx of PE on Eliquis. GIB. Unable to anticoagulate ? ?Procedure:  ?CENTRAL VENOGRAM ?INFERIOR VENA CAVA FILTER PLACEMENT  ? ?Anesthesia: Conscious Sedation ?Complications: None ?Estimated Blood Loss: Minimal ?Specimens: None ? ?Findings:  ?- access via the Right  IJ  vein. ?- successful placement of infrarenal IVC filter. ?- Argon Option ELITE Retrievable filter deployed. ?- No obvious abnormality was identified at this time. ? ?Plan: ?*IVC filters can cause complications when left in place for extended periods of time. If medically appropriate, recommend discontinuing filter prior to discharge.  ? ?*Please re-evaluate the patient for filter discontinuation when they are seen in follow up, and refer patient to Interventional Radiology for removal. ? ? ?Final report to follow once all images are reviewed and compared with previous studies. ? ?See detailed dictation with images in PACS. ?The patient tolerated the procedure well without incident or complication and was returned to Floor Bed in stable condition.  ? ? ?Michaelle Birks, MD ?[facility] ?[facility] ? ? ?Pager. [phone] ?Clinic. [phone]  ?

## 2021-07-01 NOTE — Progress Notes (Signed)
[facility] Progress Note ? ?Patient: Ann Russell    FHL:[id number]  DOA: 06/25/2021    ?Date of Service: the patient was seen and examined on 07/01/2021 ? 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  Patient also found to have an incidental urinary tract infection.  Posterior splint applied and patient seen by podiatry who recommended boot and partial weightbearing with no need for surgery at this time.  Patient started on IV antibiotics for urinary infection.  She had been noted to have episodes of significant orthostatic hypotension and frequent falls at home.   ? ?On 3/7, patient noted to suddenly have symptomatic significant anemia with a drop to 4 and once confirmed, patient underwent transfusion.  CT of abdomen pelvis unremarkable and GI consulted who took patient for EGD on 3/8.  EGD noted ulcer at [place] anastomosis from previous gastric bypass which was treated with epinephrine and heat.  Suspected that this was having chronic bleeding while on Eliquis with addition of acute bleed.  Eliquis now on hold.  Patient is near nonambulatory status even prior to this fall and so now that she is off of blood thinner, concerned about high risk for DVT.  Interventional radiology consulted and patient had IVC filter placed on 3/10. ? ?Assessment and Plan: ?Assessment and Plan: ?* UTI (urinary tract infection) ?Urine culture without growth. Completed 3 doses of ceftriaxone ? ?Symptomatic anemia ?7 drop in hemoglobin felt to be secondary to bleeding ulcer from [place] anastomosis both acute and chronic.   Patient has been on Eliquis.  Appreciate gastroenterology help.  Ulcer cauterized and treated with epinephrine.  Completed IV PPI drip on afternoon of 3/10.  Discussed with GI and likely would not recommend restarting Eliquis at least for the another month.  Hemoglobin down to 7.2 today.  Some of this may be hemoconcentration.  Transfuse for hemoglobin below 7. ? ?Closed fibular fracture ?On the left. Nondisplaced. Closed. Also with nondisplaced proximal 3rd metatarsal fracture.  ?- Discussed with Dr. Vickki Muff, podiatry, who confirms nonoperative management plan. Recommends trial of partial weight bearing (25-50%) LLE with CAM boot which has been applied. No need for splint. ?Physical therapy to see above they will likely recommended short-term skilled nursing.  Patient lives at home with her boyfriend.  She tells me however that she does not want to go to skilled nursing and she also tells me that even prior to this fall she is only minimally ambulated due to bad peripheral neuropathy.  She would like to go home with home health physical therapy and I suspect that she will become almost fully nonambulatory which she understands and he is okay with. ? ?Frequent falls ?Likely multifactorial related to hypotension, neuropathy, physical deconditioning, UTI.  We will continue treating orthostatic hypotension.  Refuses skilled nursing, see below.  PT and OT have seen patient.  Still having some episodes of dizziness which will need to get better. ? ?Hypotension ?BP with systolic in the 37D on arrival. Also several recent ED trips for syncope PTA. Persistent orthostasis here despite adequate IV fluid resuscitation. Echo performed today shows preserved LVEF 55-60%, G1DD associated with  mild LVH, small circumferential pericardial effusion without mention of diastolic collapse/tamponade.  ?- Started TED hose, midodrine.  Bleeding may have played a partial role in this. ? ?Neuropathy ?Uses assistive devices, wheelchair,  scooter, walker.  Given her now almost nonambulatory status and the fact that she is no longer on blood thinning medication (at least for the immediate future if not permanently), patient is at high risk for a blood clot.  Status post IVC filter placed 3/10. ? ?Chronic kidney disease, stage 3a ([place]) ?Presented with AKI with creatinine 1.75, now down to 1.02 with GFR of 56 ? ?Demand ischemia ([place]) ?Mild troponin elevation is down-trending without anginal symptoms or ischemic ECG changes. No definite RWMA on suboptimal echo.  ?- No further intervention currently planned. ? ?Hypertension ?Currently having severe orthostatic hypotension. Not on anti-HTN medications PTA. ? ?Neurological deficit present ?Reports impaired sensation in fingers bilaterally. No neck pain or other evidence of radiculopathy. CT head ordered and reviewed which shows no bleed or other acute changes. If symptoms persist despite normalization of blood pressure and clinical stability, would pursue further work up.  ? ?History of pulmonary embolism ?- We have to hold eliquis for now. Note normal RV size/function on echo.  ? ? ? ? ? ? ?Body mass index is 25.01 kg/m?.  ?  ?   ? ?Consultants: ?Podiatry ?Gastroenterology ?Interventional radiology ? ?Procedures: ?Status post EGD done 3/8 ?IVC filter placement 3/10 ? ?Antimicrobials: ?IV Rocephin x3 doses ? ?Code Status: Full code ? ? ?Subjective: Patient doing okay, chronic lower extremity neuropathy pain.  Tolerated procedure ? ?Objective: ?Vital signs were reviewed and unremarkable. ?Vitals:  ? 07/01/21 1551 07/01/21 1634  ?BP: (!) 172/69 (!) 175/75  ?Pulse: (!) 54 (!) 59  ?Resp: 14 18  ?Temp:  98.3 ?F (36.8 ?C)  ?SpO2: 99% 100%  ? ? ?Intake/Output Summary (Last 24 hours) at 07/01/2021 1710 ?Last data filed at 07/01/2021 1700 ?Gross per 24 hour  ?Intake 531.73 ml  ?Output 1700 ml  ?Net -1168.27 ml  ? ? ?Filed Weights  ? 06/25/21 2224 06/26/21 2055  ?Weight: 68 kg 74.6 kg  ? ?Body mass index is 25.01  kg/m?. ? ?Exam: ? ?General: Alert and oriented x3, no acute distress ?HEENT: Normocephalic and atraumatic, mucous membranes are slightly dry ?Cardiovascular: Regular rate and rhythm, S1-S2 ?Respiratory: Clear to auscultation bilaterally ?Abdomen: Soft, nontender, nondistended, positive bowel sounds ?Musculoskeletal: 1+ pitting edema from the knees down bilaterally ?Skin: No skin breaks, tears or lesions ?Psychiatry: Appropriate, no evidence of psychoses ?Neurology: significantly decreased sensation in lower extremities due to neuropathy ? ?Data Reviewed: ?Hemoglobin down to 7.2. ? ?Disposition:  ?Status is: Inpatient ?Remains inpatient appropriate because: Tolerating IVC filter.  Potential discharge tomorrow with home health PT and OT if dizziness improves ?  ? ?Family Communication: Left message for daughter ?DVT Prophylaxis: ?Place TED hose Start: 06/27/21 1239 ? ? ? ?Author: ?Annita Brod ,MD ?07/01/2021 5:10 PM ? ?To reach On-call, see care teams to locate the attending and reach out via [email or web address]. ?Between 7PM-7AM, please contact night-coverage ?If you still have difficulty reaching the attending provider, please page the [facility] (Director on Call) for [facility] on amion for assistance. ? ?

## 2021-07-01 NOTE — Care Management Important Message (Signed)
Important Message ? ?Patient Details  ?Name: Ann Russell ?MRN: [id number] ?Date of Birth: 1941/01/10 ? ? ?Medicare Important Message Given:  Yes ? ? ? ? ?[name] [name] ?07/01/2021, 12:30 PM ?

## 2021-07-01 NOTE — H&P (Signed)
[name] , MD [address], [place], [place], [place], [address] [address] [place], [place], [place], [address] Phone: [phone]  Fax: [phone]   Ann Russell is being followed for GI bleed    Subjective: Denies any abdominal pain , no BM's since a week .   Objective: Vital signs in last 24 hours: Vitals:   06/30/21 1600 06/30/21 1646 06/30/21 2012 07/01/21 0746  BP:  (!) 142/69 (!) 110/54 108/62  Pulse: 67 (!) 57 85 60  Resp: '18 18 16 16  '$ Temp:  97.6 F (36.4 C) 98.2 F (36.8 C) 98 F (36.7 C)  TempSrc:      SpO2: 100% 100% 100% 95%  Weight:      Height:       Weight change:   Intake/Output Summary (Last 24 hours) at 07/01/2021 1054 Last data filed at 06/30/2021 1500 Gross per 24 hour  Intake 49.37 ml  Output 750 ml  Net -700.63 ml     Exam:  Abdomen: soft, nontender, normal bowel sounds   Lab Results: '@LABTEST2'$ @ Micro Results: Recent Results (from the past 240 hour(s))  Resp Panel by RT-PCR (Flu A&B, Covid) Nasopharyngeal Swab     Status: None   Collection Time: 06/23/21  5:14 PM   Specimen: Nasopharyngeal Swab; Nasopharyngeal(NP) swabs in vial transport medium  Result Value Ref Range Status   SARS Coronavirus 2 by RT PCR NEGATIVE NEGATIVE Final    Comment: (NOTE) SARS-CoV-2 target nucleic acids are NOT DETECTED.  The SARS-CoV-2 RNA is generally detectable in upper respiratory specimens during the acute phase of infection. The lowest concentration of SARS-CoV-2 viral copies this assay can detect is 138 copies/mL. A negative result does not preclude SARS-Cov-2 infection and should not be used as the sole basis for treatment or other patient management decisions. A negative result may occur with  improper specimen collection/handling, submission of specimen other than nasopharyngeal swab, presence of viral mutation(s) within the areas targeted by this assay, and inadequate number of viral copies(<138 copies/mL). A negative result must  be combined with clinical observations, patient history, and epidemiological information. The expected result is Negative.  Fact Sheet for Patients:  EntrepreneurPulse.com.au  Fact Sheet for Healthcare Providers:  IncredibleEmployment.be  This test is no t yet approved or cleared by the [place] FDA and  has been authorized for detection and/or diagnosis of SARS-CoV-2 by FDA under an Emergency Use Authorization (EUA). This EUA will remain  in effect (meaning this test can be used) for the duration of the COVID-19 declaration under Section 564(b)(1) of the Act, 21 U.S.C.section 360bbb-3(b)(1), unless the authorization is terminated  or revoked sooner.       Influenza A by PCR NEGATIVE NEGATIVE Final   Influenza B by PCR NEGATIVE NEGATIVE Final    Comment: (NOTE) The Xpert Xpress SARS-CoV-2/FLU/RSV plus assay is intended as an aid in the diagnosis of influenza from Nasopharyngeal swab specimens and should not be used as a sole basis for treatment. Nasal washings and aspirates are unacceptable for Xpert Xpress SARS-CoV-2/FLU/RSV testing.  Fact Sheet for Patients: EntrepreneurPulse.com.au  Fact Sheet for Healthcare Providers: IncredibleEmployment.be  This test is not yet approved or cleared by the [place] FDA and has been authorized for detection and/or diagnosis of SARS-CoV-2 by FDA under an Emergency Use Authorization (EUA). This EUA will remain in effect (meaning this test can be used) for the duration of the COVID-19 declaration under Section 564(b)(1) of the Act, 21 U.S.C. section 360bbb-3(b)(1), unless  the authorization is terminated or revoked.  Performed at [facility], [address] [place], [place] [address]   Urine Culture     Status: None   Collection Time: 06/26/21  2:57 AM   Specimen: Urine, Clean Catch  Result Value Ref Range Status   Specimen Description   Final     URINE, CLEAN CATCH Performed at [facility], [address], [place], [place] [address]    Special Requests   Final    NONE Performed at [facility], [address] [place], [place] [address]    Culture   Final    NO GROWTH Performed at [facility], [place] [address], [place], [place] [address]    Report Status 06/27/2021 FINAL  Final  Resp Panel by RT-PCR (Flu A&B, Covid) Nasopharyngeal Swab     Status: None   Collection Time: 06/26/21  4:18 AM   Specimen: Nasopharyngeal Swab; Nasopharyngeal(NP) swabs in vial transport medium  Result Value Ref Range Status   SARS Coronavirus 2 by RT PCR NEGATIVE NEGATIVE Final    Comment: (NOTE) SARS-CoV-2 target nucleic acids are NOT DETECTED.  The SARS-CoV-2 RNA is generally detectable in upper respiratory specimens during the acute phase of infection. The lowest concentration of SARS-CoV-2 viral copies this assay can detect is 138 copies/mL. A negative result does not preclude SARS-Cov-2 infection and should not be used as the sole basis for treatment or other patient management decisions. A negative result may occur with  improper specimen collection/handling, submission of specimen other than nasopharyngeal swab, presence of viral mutation(s) within the areas targeted by this assay, and inadequate number of viral copies(<138 copies/mL). A negative result must be combined with clinical observations, patient history, and epidemiological information. The expected result is Negative.  Fact Sheet for Patients:  EntrepreneurPulse.com.au  Fact Sheet for Healthcare Providers:  IncredibleEmployment.be  This test is no t yet approved or cleared by the [place] FDA and  has been authorized for detection and/or diagnosis of SARS-CoV-2 by FDA under an Emergency Use Authorization (EUA). This EUA will remain  in effect (meaning this test can be used) for the duration of  the COVID-19 declaration under Section 564(b)(1) of the Act, 21 U.S.C.section 360bbb-3(b)(1), unless the authorization is terminated  or revoked sooner.       Influenza A by PCR NEGATIVE NEGATIVE Final   Influenza B by PCR NEGATIVE NEGATIVE Final    Comment: (NOTE) The Xpert Xpress SARS-CoV-2/FLU/RSV plus assay is intended as an aid in the diagnosis of influenza from Nasopharyngeal swab specimens and should not be used as a sole basis for treatment. Nasal washings and aspirates are unacceptable for Xpert Xpress SARS-CoV-2/FLU/RSV testing.  Fact Sheet for Patients: EntrepreneurPulse.com.au  Fact Sheet for Healthcare Providers: IncredibleEmployment.be  This test is not yet approved or cleared by the [place] FDA and has been authorized for detection and/or diagnosis of SARS-CoV-2 by FDA under an Emergency Use Authorization (EUA). This EUA will remain in effect (meaning this test can be used) for the duration of the COVID-19 declaration under Section 564(b)(1) of the Act, 21 U.S.C. section 360bbb-3(b)(1), unless the authorization is terminated or revoked.  Performed at [facility], [address] [place], [place] [address]   MRSA Next Gen by PCR, Nasal     Status: None   Collection Time: 06/28/21  3:48 PM   Specimen: Nasal Mucosa; Nasal Swab  Result Value Ref Range Status   MRSA by PCR Next Gen NOT  DETECTED NOT DETECTED Final    Comment: (NOTE) The GeneXpert MRSA Assay (FDA approved for NASAL specimens only), is one component of a comprehensive MRSA colonization surveillance program. It is not intended to diagnose MRSA infection nor to guide or monitor treatment for MRSA infections. Test performance is not FDA approved in patients less than 60 years old. Performed at [facility], [address], [place], [place] [address]    Studies/Results: CT ABDOMEN W CONTRAST  Result Date: 06/29/2021 CLINICAL DATA:   Postop abdominal pain. Peritonitis suspected. endoscopy this morning. This found ulcers at the gastro/jejunal anastomosis. Evaluate perforation. EXAM: CT ABDOMEN WITH CONTRAST TECHNIQUE: Multidetector CT imaging of the abdomen was performed using the standard protocol following bolus administration of intravenous contrast. RADIATION DOSE REDUCTION: This exam was performed according to the departmental dose-optimization program which includes automated exposure control, adjustment of the mA and/or kV according to patient size and/or use of iterative reconstruction technique. CONTRAST:  74m OMNIPAQUE IOHEXOL 300 MG/ML  SOLN COMPARISON:  Yesterday's abdominopelvic CT. FINDINGS: Lower chest: Left base scarring. Mild cardiomegaly, with trace bilateral pleural effusions. Lad coronary artery calcification. Hepatobiliary: Normal liver. Cholecystectomy. Upper normal intrahepatic ducts. Normal common duct caliber. Pancreas: Pancreatic atrophy. No duct dilatation or acute inflammation. Spleen: Normal in size, without focal abnormality. Adrenals/Urinary Tract: Bilateral adrenal thickening, with maintenance of adreniform shape. Moderate renal cortical scarring bilaterally. Areas of retained cortex in both kidneys create "pseudotumors". There also too small to characterize low-density bilateral renal lesions which are likely cysts. Punctate right renal collecting system calculi. No hydronephrosis. Stomach/Bowel: Status post Roux-en-Y gastric bypass. Wall thickening of the gastric remnant including on 25/2 and adjacent jejunum on 30/2 may correspond to site of ulcers on endoscopy. Contrast in the bypassed stomach may be due to reflux versus less likely gastric/gastric fistula. Large colonic stool burden.  Otherwise normal small bowel. Vascular/Lymphatic: Aortic atherosclerosis. No retroperitoneal or retrocrural adenopathy. Other: No abdominal ascites.  No free intraperitoneal air. Musculoskeletal: Lumbar spondylosis. S shaped  lumbar spine curvature. IMPRESSION: 1. No evidence of free intraperitoneal air. 2. Status post Roux-en-Y gastric bypass with wall thickening of the gastric remnant and adjacent jejunum, likely representing gastritis/enteritis and possibly sites of ulcers on today's endoscopy. 3. Right nephrolithiasis 4. Trace bilateral pleural effusions. 5. Coronary artery atherosclerosis. Aortic Atherosclerosis (ICD10-I70.0). Electronically Signed   By: KAbigail MiyamotoM.D.   On: 06/29/2021 14:45   Medications: I have reviewed the patient's current medications. Scheduled Meds:  busPIRone  15 mg Oral BID   calcitRIOL  0.25 mcg Oral Daily   Chlorhexidine Gluconate Cloth  6 each Topical Daily   FLUoxetine  60 mg Oral BH-[id number]   midodrine  5 mg Oral TID WC   [START ON 07/02/2021] pantoprazole  40 mg Intravenous Q12H   pregabalin  100 mg Oral TID   sucralfate  1 g Oral TID WC & HS   Continuous Infusions:  sodium chloride 50 mL/hr at 07/01/21 1024   pantoprazole 8 mg/hr (07/01/21 0217)   PRN Meds:.acetaminophen **OR** acetaminophen, HYDROcodone-acetaminophen, iohexol, labetalol, ondansetron **OR** ondansetron (ZOFRAN) IV CBC Latest Ref Rng & Units 07/01/2021 06/30/2021 06/29/2021  WBC 4.0 - 10.5 K/uL 6.7 7.1 6.9  Hemoglobin 12.0 - 15.0 g/dL 7.2(L) 7.6(L) 8.1(L)  Hematocrit 36.0 - 46.0 % 22.4(L) 22.8(L) 23.9(L)  Platelets 150 - 400 K/uL 166 159 157     Assessment: Principal Problem:   UTI (urinary tract infection) Active Problems:   Hypertension   Symptomatic anemia   History of pulmonary embolism  Neuropathy   Frequent falls   Chronic kidney disease, stage 3a ([facility])   Closed fibular fracture   Hypotension   Demand ischemia ([facility])   Neurological deficit present  Ann Russell 81 y.o. female with history of gastric bypass Roux en Y, s/p fall and fibula #, incidentally found to have a drop in hb from 13 grams to 4.9 grams with no overt blood seen. S/p EGD on 06/30/2021 large ulcer at [place] anastamosis with visible  vessel treated with Epinephrine and gold probe . Post procedure patient had abdominal pain , CT showed no perforation. Hb stable- slight drop likely mirroring drop in creatinine with hydration. I have discussed with Dr Maryland Pink and believe plan for IVC filter as high risk to restart eloquis due to large ulcer.    Plan  Monitor CBC and transfuse as needed  IV PPI GTT for 72 hours then omeprazole 40 mg Bid for 3 months.  Carafate 4 times daily Repeat EGD in 8 weeks to check for healing of ulcer  IF has repeat bleeding then may need to consider surgical options vs repeat EGD and application of hemospray/Clips.  F/u H. pylori serology Can eat and drink after her IVC filter, suggest miralax BID for constipation.        LOS: 4 days   [name], MD 07/01/2021, 10:54 AM

## 2021-07-01 NOTE — Progress Notes (Signed)
OT Cancellation Note ? ?Patient Details ?Name: Ann Russell ?MRN: [id number] ?DOB: [birth year] ? ? ?Cancelled Treatment:    Reason Eval/Treat Not Completed: Patient at procedure or test/ unavailable. Pt with transport staff heading off the floor. Per chart, going for IVC filter placement. Will re-attempt re-evaluation for OT at later date/time as pt is available/appropriate.  ? ?Ardeth Perfect., MPH, MS, OTR/L ?ascom [phone] ?07/01/21, 1:54 PM ? ?

## 2021-07-01 NOTE — Consult Note (Signed)
Chief Complaint: History of PE ( on eliquis). Found to have a GI bleed during admission. Request is for a IVC filter  Referring Physician(s): Dr. Lyman Speller  Supervising Physician: Michaelle Birks  Patient Status: [facility] - In-pt  History of Present Illness: Ann Russell is a 81 y.o. female inpatient. 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 Previous gastric bypass surgery.  Admitted on 3.4.23  with left ankle pain related to fall. Patient found to be orthostatic with a nondisplaced closed fracture of the distal left fibula and proximal left third metatarsal. Hospital stay complicated by a r GI bleed. EGD performed on 3.8.23 shows a ulcer at the [place] anastomosis of previus gastric bypass surgery. Team is requesting an IVC filter.  Currently without any significant complaints. Patient alert and laying in bed, calm and comfortable. Denies any fevers, headache, chest pain, SOB, cough, abdominal pain, nausea, vomiting or bleeding. Return precautions and treatment recommendations and follow-up discussed with the patient  who is agreeable with the plan.   Past Medical History:  Diagnosis Date   Arthritis    hands   Cancer ([place])    squamous cell mole- back of right arm   Chronic kidney disease    .  Takes Vasotec for kidneys.-stage 3   Depression    Hyperlipemia    Hypertension    h/o had gastric bypass and was able to come off meds   Neuropathy    Pulmonary embolism (HCC)    Sleep apnea    no cpap since her gastric bypass    Past Surgical History:  Procedure Laterality Date   ABDOMINAL HYSTERECTOMY     Buniectomy     bil   CHOLECYSTECTOMY     ESOPHAGOGASTRODUODENOSCOPY (EGD) WITH PROPOFOL N/A 06/29/2021   Procedure: ESOPHAGOGASTRODUODENOSCOPY (EGD) WITH PROPOFOL;  Surgeon: [name], MD;  Location: [facility] ENDOSCOPY;  Service: Gastroenterology;  Laterality: N/A;   FACIAL COSMETIC SURGERY  2011   GASTRIC BYPASS  2010   PULMONARY THROMBECTOMY  Bilateral 06/22/2017   Procedure: PULMONARY THROMBECTOMY;  Surgeon: Algernon Huxley, MD;  Location: [place] [facility] LAB;  Service: Cardiovascular;  Laterality: Bilateral;   Rotor cuff     right   SCLERAL BUCKLE  11/07/2011   Procedure: SCLERAL BUCKLE;  Surgeon: Hayden Pedro, MD;  Location: [place];  Service: Ophthalmology;  Laterality: Left;   TOTAL KNEE ARTHROPLASTY Right 12/03/2018   Procedure: RIGHT TOTAL KNEE ARTHROPLASTY;  Surgeon: Hessie Knows, MD;  Location: [facility] ORS;  Service: Orthopedics;  Laterality: Right;    Allergies: Patient has no known allergies.  Medications: Prior to Admission medications   Medication Sig Start Date End Date Taking? Authorizing Provider  acetaminophen (TYLENOL) 650 MG CR tablet Take 650 mg by mouth 2 (two) times a day.   Yes [provider]  apixaban (ELIQUIS) 5 MG TABS tablet Take 1 tablet (5 mg total) by mouth 2 (two) times daily. 05/23/21  Yes Baity, Coralie Keens, NP  B COMPLEX VITAMINS SL Place 1 tablet under the tongue daily.   Yes [provider]  busPIRone (BUSPAR) 15 MG tablet Take 1 tablet (15 mg total) by mouth 2 (two) times daily. 05/23/21  Yes Baity, Coralie Keens, NP  calcitRIOL (ROCALTROL) 0.25 MCG capsule Take 1 capsule (0.25 mcg total) by mouth daily. 05/23/21  Yes Jearld Fenton, NP  Calcium Carb-Cholecalciferol (CALCIUM 600 + D PO) Take 1 tablet by mouth daily.   Yes [provider]  denosumab (PROLIA) 60 MG/ML SOSY injection Inject 60 mg into the skin every 6 (six) months. 12/14/20  Yes [provider]  ferrous [id number] C-folic acid (TRINSICON / FOLTRIN) capsule Take 1 capsule by mouth 2 (two) times daily. 12/06/18  Yes Duanne Guess, PA-C  FLUoxetine (PROZAC) 20 MG capsule Take 3 capsules (60 mg total) by mouth every morning. Patient taking differently: Take 20 mg by mouth 3 (three) times daily. 05/23/21  Yes Baity, Coralie Keens, NP  Multiple Vitamin (MULTI-VITAMINS) TABS Take 2 tablets by mouth 2 (two)  times daily.   Yes [provider]  omeprazole (PRILOSEC) 20 MG capsule Take 1 capsule (20 mg total) by mouth daily. 06/23/21  Yes Baity, Coralie Keens, NP  pregabalin (LYRICA) 100 MG capsule Take 1 capsule (100 mg total) by mouth 3 (three) times daily. 05/23/21 05/23/22 Yes Baity, Coralie Keens, NP  Pyridoxine HCl (VITAMIN B6 PO) Take 1 tablet by mouth daily. With b12   Yes [provider]  valACYclovir (VALTREX) 1000 MG tablet Take 1 tablet (1,000 mg total) by mouth daily as needed (fever blisters). 05/23/21  Yes Baity, Coralie Keens, NP  bisacodyl (DULCOLAX) 5 MG EC tablet Take 1 tablet (5 mg total) by mouth daily as needed for moderate constipation. Patient not taking: Reported on 06/26/2021 12/06/18   Duanne Guess, PA-C  HYDROcodone-acetaminophen (NORCO/VICODIN) 5-325 MG tablet Take 1 tablet by mouth every 8 (eight) hours as needed for moderate pain. Patient not taking: Reported on 06/26/2021 06/23/21   Jearld Fenton, NP     Family History  Problem Relation Age of Onset   Heart disease Mother    Dementia Mother    Neuropathy Father    Diabetes Brother    Breast cancer Neg Hx     Social History   Socioeconomic History   Marital status: Significant Other    Spouse name: Not on file   Number of children: 1   Years of education: Not on file   Highest education level: Not on file  Occupational History   Occupation: retired  Tobacco Use   Smoking status: Never   Smokeless tobacco: Never  Vaping Use   Vaping Use: Never used  Substance and Sexual Activity   Alcohol use: No   Drug use: No   Sexual activity: Not Currently  Other Topics Concern   Not on file  Social History Narrative   Not on file   Social Determinants of Health   Financial Resource Strain: Not on file  Food Insecurity: Not on file  Transportation Needs: Not on file  Physical Activity: Not on file  Stress: Not on file  Social Connections: Not on file    Review of Systems: A 12 point ROS discussed and  pertinent positives are indicated in the HPI above.  All other systems are negative.  Review of Systems  Constitutional:  Negative for fatigue and fever.  HENT:  Negative for congestion.   Respiratory:  Negative for cough and shortness of breath.   Gastrointestinal:  Negative for abdominal pain, diarrhea, nausea and vomiting.   Vital Signs: BP 108/62 (BP Location: Left Arm)    Pulse 60    Temp 98 F (36.7 C)    Resp 16    Ht '5\' 8"'$  (1.727 m)    Wt 164 lb 7.4 oz (74.6 kg)    SpO2 95%    BMI 25.01 kg/m   Physical Exam Vitals and nursing note reviewed.  Constitutional:  Appearance: She is well-developed.  HENT:     Head: Normocephalic and atraumatic.  Eyes:     Conjunctiva/sclera: Conjunctivae normal.  Cardiovascular:     Rate and Rhythm: Normal rate and regular rhythm.     Heart sounds: Normal heart sounds.  Pulmonary:     Effort: Pulmonary effort is normal.     Breath sounds: Normal breath sounds.  Musculoskeletal:        General: Normal range of motion.     Cervical back: Normal range of motion.  Skin:    General: Skin is warm.  Neurological:     Mental Status: She is alert and oriented to person, place, and time.    Imaging: CT ABDOMEN PELVIS WO CONTRAST  Result Date: 06/28/2021 CLINICAL DATA:  Retro peritoneal bleed suspected. EXAM: CT ABDOMEN AND PELVIS WITHOUT CONTRAST TECHNIQUE: Multidetector CT imaging of the abdomen and pelvis was performed following the standard protocol without IV contrast. RADIATION DOSE REDUCTION: This exam was performed according to the departmental dose-optimization program which includes automated exposure control, adjustment of the mA and/or kV according to patient size and/or use of iterative reconstruction technique. COMPARISON:  06/23/2021 FINDINGS: Lower chest: Minimal scarring or atelectasis at the lung bases. Hepatobiliary: Liver parenchyma appears normal without contrast. Previous cholecystectomy. Pancreas: Normal Spleen: Normal  Adrenals/Urinary Tract: Adrenal glands are normal. Renal cortical atrophy with multiple renal cysts. Nonobstructing 2 mm stones in the right kidney. No hydroureteronephrosis. The bladder is normal. Stomach/Bowel: Previous gastric surgery. Small bowel appears normal. No acute or significant: Finding. Diverticulosis without evidence of diverticulitis. Vascular/Lymphatic: Aortic atherosclerosis. No aneurysm. IVC is normal. No retroperitoneal adenopathy or retroperitoneal bleeding. Reproductive: Previous hysterectomy.  No pelvic mass. Other: No free fluid or air. Periumbilical ventral hernia containing only fat. Musculoskeletal: Chronic lumbar degenerative changes. IMPRESSION: No evidence of retroperitoneal or intraperitoneal hemorrhage. Aortic Atherosclerosis (ICD10-I70.0). Previous cholecystectomy. Previous hysterectomy. Previous gastric surgery. Electronically Signed   By: Nelson Chimes M.D.   On: 06/28/2021 15:03   DG Tibia/Fibula Left  Result Date: 06/25/2021 CLINICAL DATA:  Recent fall with left leg pain, initial encounter EXAM: LEFT TIBIA AND FIBULA - 2 VIEW COMPARISON:  None. FINDINGS: Mild degenerative changes of the knee joint are seen. Lateral soft tissue swelling is again seen distally. Lucency is noted in the distal aspect of the fibula consistent with undisplaced fracture better visualized than on recent ankle films. No other focal abnormality is noted. IMPRESSION: Distal fibular fracture without significant displacement better visualized than on recent ankle films. Electronically Signed   By: Inez Catalina M.D.   On: 06/25/2021 23:45   DG Ankle Complete Left  Result Date: 06/25/2021 CLINICAL DATA:  Recent fall with ankle pain, initial encounter EXAM: LEFT ANKLE COMPLETE - 3+ VIEW COMPARISON:  None. FINDINGS: Soft tissue swelling is noted laterally. The distal tibia and fibula show no acute fracture. Tarsal degenerative changes are seen. Postsurgical changes in the first metatarsal are noted. No acute  fracture or dislocation is noted. IMPRESSION: Soft tissue changes without acute abnormality. Electronically Signed   By: Inez Catalina M.D.   On: 06/25/2021 22:50   CT HEAD WO CONTRAST (5MM)  Result Date: 06/28/2021 CLINICAL DATA:  Neuro deficit, acute, stroke suspected EXAM: CT HEAD WITHOUT CONTRAST TECHNIQUE: Contiguous axial images were obtained from the base of the skull through the vertex without intravenous contrast. RADIATION DOSE REDUCTION: This exam was performed according to the departmental dose-optimization program which includes automated exposure control, adjustment of the mA and/or kV according to patient  size and/or use of iterative reconstruction technique. COMPARISON:  June 23, 2021. FINDINGS: Brain: No evidence of acute large vascular territory infarction, hemorrhage, hydrocephalus, extra-axial collection or mass lesion/mass effect. Cerebral atrophy. Mild patchy white matter hypoattenuation, nonspecific but paddle with chronic microvascular ischemic disease. Vascular: No hyperdense vessel identified. Calcific intracranial atherosclerosis. Skull: No acute fracture. Sinuses/Orbits: Opacification of scattered ethmoid air cells. Otherwise, visualized sinuses are largely clear. Postsurgical changes of the left globe, partially imaged. Other: No mastoid effusions. IMPRESSION: 1. No evidence of acute intracranial abnormality by CT. MRI could provide more sensitive evaluation for acute infarct if clinically indicated. 2. Atrophy and chronic microvascular disease. Electronically Signed   By: Margaretha Sheffield M.D.   On: 06/28/2021 13:40   CT Head Wo Contrast  Result Date: 06/23/2021 CLINICAL DATA:  Altered mental status EXAM: CT HEAD WITHOUT CONTRAST TECHNIQUE: Contiguous axial images were obtained from the base of the skull through the vertex without intravenous contrast. RADIATION DOSE REDUCTION: This exam was performed according to the departmental dose-optimization program which includes automated  exposure control, adjustment of the mA and/or kV according to patient size and/or use of iterative reconstruction technique. COMPARISON:  09/20/2019 FINDINGS: Brain: No acute intracranial findings are seen. Cortical sulci are prominent. Cerebellar tonsils are lower than usual in the position. There is decreased density in the subcortical and periventricular white matter. Vascular: Unremarkable. Skull: Unremarkable. Sinuses/Orbits: There is mucosal thickening in the ethmoid sinus. Other: No significant interval changes are noted. IMPRESSION: No acute intracranial findings are seen in noncontrast CT brain. Atrophy. Small-vessel disease. 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 This finding has not changed significantly. Chronic ethmoid sinusitis. Electronically Signed   By: Elmer Picker M.D.   On: 06/23/2021 19:58   CT ABDOMEN W CONTRAST  Result Date: 06/29/2021 CLINICAL DATA:  Postop abdominal pain. Peritonitis suspected. endoscopy this morning. This found ulcers at the gastro/jejunal anastomosis. Evaluate perforation. EXAM: CT ABDOMEN WITH CONTRAST TECHNIQUE: Multidetector CT imaging of the abdomen was performed using the standard protocol following bolus administration of intravenous contrast. RADIATION DOSE REDUCTION: This exam was performed according to the departmental dose-optimization program which includes automated exposure control, adjustment of the mA and/or kV according to patient size and/or use of iterative reconstruction technique. CONTRAST:  68m OMNIPAQUE IOHEXOL 300 MG/ML  SOLN COMPARISON:  Yesterday's abdominopelvic CT. FINDINGS: Lower chest: Left base scarring. Mild cardiomegaly, with trace bilateral pleural effusions. Lad coronary artery calcification. Hepatobiliary: Normal liver. Cholecystectomy. Upper normal intrahepatic ducts. Normal common duct caliber. Pancreas: Pancreatic atrophy. No duct dilatation or acute inflammation.  Spleen: Normal in size, without focal abnormality. Adrenals/Urinary Tract: Bilateral adrenal thickening, with maintenance of adreniform shape. Moderate renal cortical scarring bilaterally. Areas of retained cortex in both kidneys create "pseudotumors". There also too small to characterize low-density bilateral renal lesions which are likely cysts. Punctate right renal collecting system calculi. No hydronephrosis. Stomach/Bowel: Status post Roux-en-Y gastric bypass. Wall thickening of the gastric remnant including on 25/2 and adjacent jejunum on 30/2 may correspond to site of ulcers on endoscopy. Contrast in the bypassed stomach may be due to reflux versus less likely gastric/gastric fistula. Large colonic stool burden.  Otherwise normal small bowel. Vascular/Lymphatic: Aortic atherosclerosis. No retroperitoneal or retrocrural adenopathy. Other: No abdominal ascites.  No free intraperitoneal air. Musculoskeletal: Lumbar spondylosis. S shaped lumbar spine curvature. IMPRESSION: 1. No evidence of free intraperitoneal air. 2. Status post Roux-en-Y gastric bypass with wall thickening of the gastric remnant and adjacent jejunum, likely  representing gastritis/enteritis and possibly sites of ulcers on today's endoscopy. 3. Right nephrolithiasis 4. Trace bilateral pleural effusions. 5. Coronary artery atherosclerosis. Aortic Atherosclerosis (ICD10-I70.0). Electronically Signed   By: Abigail Miyamoto M.D.   On: 06/29/2021 14:45   DG Chest Portable 1 View  Result Date: 06/23/2021 CLINICAL DATA:  syncope, sob EXAM: PORTABLE CHEST 1 VIEW COMPARISON:  Chest x-ray 09/20/2019, CT chest 06/21/2017 FINDINGS: The heart and mediastinal contours are unchanged. Aortic calcification. No focal consolidation. No pulmonary edema. No pleural effusion. No pneumothorax. No acute osseous abnormality.  Right shoulder degenerative changes. IMPRESSION: 1. No active disease. 2.  Aortic Atherosclerosis (ICD10-I70.0). Electronically Signed   By:  Iven Finn M.D.   On: 06/23/2021 17:49   DG Foot Complete Left  Result Date: 06/25/2021 CLINICAL DATA:  Recent fall with ankle and foot pain, initial encounter EXAM: LEFT FOOT - COMPLETE 3+ VIEW COMPARISON:  None. FINDINGS: Postsurgical changes in the first metatarsal are noted. Hallux valgus deformity seen. Tarsal degenerative changes are noted. Undisplaced fracture at the base of the third metatarsal is noted. Mild soft tissue swelling is noted about the ankle. IMPRESSION: Third metatarsal fracture proximally. Chronic changes about the tarsal bones. Electronically Signed   By: Inez Catalina M.D.   On: 06/25/2021 23:42   ECHOCARDIOGRAM COMPLETE  Result Date: 06/28/2021    ECHOCARDIOGRAM REPORT   Patient Name:   Ann Russell Date of Exam: 06/28/2021 Medical Rec #:  [id number]     Height:       68.0 in Accession #:    [id number]    Weight:       164.5 lb Date of Birth:  [birth year]     BSA:          1.881 m Patient Age:    25 years      BP:           110/57 mmHg Patient Gender: F             HR:           72 bpm. Exam Location:  [facility] Procedure: 2D Echo, Cardiac Doppler and Color Doppler Indications:     Murmur R01.1                  Syncope R55  History:         Patient has no prior history of Echocardiogram examinations.                  Risk Factors:Hypertension, Dyslipidemia and Sleep Apnea. CKD,                  pulmonary embolism.  Sonographer:     Sherrie Sport Referring Phys:  [id number] Patrecia Pour Diagnosing Phys: Nelva Bush MD  Sonographer Comments: Suboptimal apical window. IMPRESSIONS  1. Left ventricular ejection fraction, by estimation, is 55 to 60%. The left ventricle has normal function. Left ventricular endocardial border not optimally defined to evaluate regional wall motion. There is mild left ventricular hypertrophy. Left ventricular diastolic parameters are consistent with Grade I diastolic dysfunction (impaired relaxation).  2. Right ventricular systolic function is normal. The right  ventricular size is normal.  3. A small pericardial effusion is present. The pericardial effusion is circumferential.  4. The mitral valve is degenerative. Trivial mitral valve regurgitation. No evidence of mitral stenosis.  5. The aortic valve was not well visualized. Aortic valve regurgitation is not visualized. No aortic stenosis is present. FINDINGS  Left Ventricle: Left ventricular ejection  fraction, by estimation, is 55 to 60%. The left ventricle has normal function. Left ventricular endocardial border not optimally defined to evaluate regional wall motion. The left ventricular internal cavity size was normal in size. There is mild left ventricular hypertrophy. Left ventricular diastolic parameters are consistent with Grade I diastolic dysfunction (impaired relaxation). Right Ventricle: The right ventricular size is normal. No increase in right ventricular wall thickness. Right ventricular systolic function is normal. Left Atrium: Left atrial size was normal in size. Right Atrium: Right atrial size was normal in size. Pericardium: A small pericardial effusion is present. The pericardial effusion is circumferential. Mitral Valve: The mitral valve is degenerative in appearance. There is mild thickening of the mitral valve leaflet(s). Mild mitral annular calcification. Trivial mitral valve regurgitation. No evidence of mitral valve stenosis. MV peak gradient, 4.2 mmHg. The mean mitral valve gradient is 2.0 mmHg. Tricuspid Valve: The tricuspid valve is normal in structure. Tricuspid valve regurgitation is trivial. Aortic Valve: The aortic valve was not well visualized. Aortic valve regurgitation is not visualized. No aortic stenosis is present. Aortic valve mean gradient measures 4.0 mmHg. Aortic valve peak gradient measures 7.4 mmHg. Aortic valve area, by VTI measures 2.65 cm. Pulmonic Valve: The pulmonic valve was not well visualized. Aorta: The aortic root is normal in size and structure. Pulmonary Artery: The  pulmonary artery is not well seen. IAS/Shunts: The interatrial septum was not well visualized.  LEFT VENTRICLE PLAX 2D LVIDd:         4.60 cm   Diastology LVIDs:         2.80 cm   LV e' medial:    6.64 cm/s LV PW:         1.20 cm   LV E/e' medial:  9.7 LV IVS:        0.90 cm   LV e' lateral:   9.14 cm/s LVOT diam:     2.00 cm   LV E/e' lateral: 7.0 LV SV:         60 LV SV Index:   32 LVOT Area:     3.14 cm  RIGHT VENTRICLE RV Basal diam:  3.30 cm RV S prime:     16.10 cm/s TAPSE (M-mode): 2.2 cm LEFT ATRIUM           Index        RIGHT ATRIUM           Index LA diam:      3.60 cm 1.91 cm/m   RA Area:     17.59 cm LA Vol (A4C): 35.7 ml 18.98 ml/m  RA Volume:   51.71 ml  27.49 ml/m  AORTIC VALVE AV Area (Vmax):    1.91 cm AV Area (Vmean):   1.71 cm AV Area (VTI):     2.65 cm AV Vmax:           136.00 cm/s AV Vmean:          87.300 cm/s AV VTI:            0.228 m AV Peak Grad:      7.4 mmHg AV Mean Grad:      4.0 mmHg LVOT Vmax:         82.90 cm/s LVOT Vmean:        47.400 cm/s LVOT VTI:          0.192 m LVOT/AV VTI ratio: 0.84  AORTA Ao Root diam: 2.93 cm MITRAL VALVE  TRICUSPID VALVE MV Area (PHT): 3.50 cm    TR Peak grad:   17.6 mmHg MV Area VTI:   2.94 cm    TR Vmax:        210.00 cm/s MV Peak grad:  4.2 mmHg MV Mean grad:  2.0 mmHg    SHUNTS MV Vmax:       1.03 m/s    Systemic VTI:  0.19 m MV Vmean:      63.9 cm/s   Systemic Diam: 2.00 cm MV Decel Time: 217 msec MV E velocity: 64.30 cm/s MV A velocity: 88.30 cm/s MV E/A ratio:  0.73 Harrell Gave End MD Electronically signed by Nelva Bush MD Signature Date/Time: 06/28/2021/8:44:22 AM    Final    CT Angio Chest/Abd/Pel for Dissection W and/or Wo Contrast  Result Date: 06/23/2021 CLINICAL DATA:  Chest pain or back pain, aortic dissection suspected. Syncope, Dehydration, CKD, now having Urinary Frequency and Epigastric Pain EXAM: CT ANGIOGRAPHY CHEST, ABDOMEN AND PELVIS TECHNIQUE: Non-contrast CT of the chest was initially obtained.  Multidetector CT imaging through the chest, abdomen and pelvis was performed using the standard protocol during bolus administration of intravenous contrast. Multiplanar reconstructed images and MIPs were obtained and reviewed to evaluate the vascular anatomy. RADIATION DOSE REDUCTION: This exam was performed according to the departmental dose-optimization program which includes automated exposure control, adjustment of the mA and/or kV according to patient size and/or use of iterative reconstruction technique. CONTRAST:  54m OMNIPAQUE IOHEXOL 350 MG/ML SOLN COMPARISON:  CT angiography chest 06/21/2017 FINDINGS: CTA CHEST FINDINGS Cardiovascular Preferential opacification of the thoracic aorta. No evidence of thoracic aortic aneurysm or dissection. Normal heart size. No significant pericardial effusion. The thoracic aorta is normal in caliber. No atherosclerotic plaque of the thoracic aorta. At least 2 vessel coronary artery calcifications. The main pulmonary artery is normal in caliber. No central or segmental pulmonary embolus. Mediastinum/Nodes: No enlarged mediastinal, hilar, or axillary lymph nodes. Thyroid gland, trachea, and esophagus demonstrate no significant findings. Lungs/Pleura: No focal consolidation. Several stable scattered calcified pulmonary micronodules. No pulmonary mass. No pleural effusion. No pneumothorax. Musculoskeletal: No chest wall abnormality. No suspicious lytic or blastic osseous lesions. No acute displaced fracture. Multilevel degenerative changes of the spine. Review of the MIP images confirms the above findings. CTA ABDOMEN AND PELVIS FINDINGS VASCULAR Aorta: Mild atherosclerotic. Normal caliber aorta without aneurysm, dissection, vasculitis or significant stenosis. Celiac: Mild atherosclerotic plaque. Patent without evidence of aneurysm, dissection, vasculitis or significant stenosis. SMA: Mild atherosclerotic plaque. Patent without evidence of aneurysm, dissection, vasculitis or  significant stenosis. Renals: Moderate atherosclerotic plaque. Both renal arteries are patent without evidence of aneurysm, dissection, vasculitis, fibromuscular dysplasia or significant stenosis. IMA: Patent without evidence of aneurysm, dissection, vasculitis or significant stenosis. Inflow: Mild atherosclerotic plaque. Patent without evidence of aneurysm, dissection, vasculitis or significant stenosis. Veins: No obvious venous abnormality within the limitations of this arterial phase study. Review of the MIP images confirms the above findings. NON-VASCULAR Hepatobiliary: No focal liver abnormality. Status post cholecystectomy. No biliary dilatation. Pancreas: No focal lesion. Normal pancreatic contour. No surrounding inflammatory changes. No main pancreatic ductal dilatation. Spleen: Normal in size without focal abnormality. Adrenals/Urinary Tract: No adrenal nodule bilaterally. Bilateral kidneys enhance symmetrically.  Bilateral renal scarring. No hydronephrosis. No hydroureter. The urinary bladder is unremarkable. Stomach/Bowel: Roux-en-Y gastric bypass. Stomach is within normal limits. No evidence of bowel wall thickening or dilatation. Scattered colonic diverticulosis. Appendix appears normal. Lymphatic: No lymphadenopathy. Reproductive: Status post hysterectomy. No adnexal masses. Other: No intraperitoneal free fluid.  No intraperitoneal free gas. No organized fluid collection. Musculoskeletal: No abdominal wall hernia or abnormality. No suspicious lytic or blastic osseous lesions. No acute displaced fracture. Levocurvature of the lumbar spine with associated degenerative changes. Intervertebral disc space vacuum phenomenon. Mild retrolisthesis of L3 on L4. Review of the MIP images confirms the above findings. IMPRESSION: 1. No acute aortic abnormality. 2. No central or segment pulmonary embolus. 3. No acute intrathoracic abnormality. 4. Colonic diverticulosis with no acute diverticulitis. Electronically  Signed   By: Iven Finn M.D.   On: 06/23/2021 20:09    Labs:  CBC: Recent Labs    06/28/21 1113 06/28/21 1547 06/28/21 2258 06/29/21 0357 06/30/21 0946 07/01/21 0313  WBC 7.4  --   --  6.9 7.1 6.7  HGB 4.8*   < > 8.3* 8.1* 7.6* 7.2*  HCT 14.5*   < > 24.7* 23.9* 22.8* 22.4*  PLT 181  --   --  157 159 166   < > = values in this interval not displayed.    COAGS: Recent Labs    06/23/21 1714  INR 1.4*  APTT 32    BMP: Recent Labs    06/27/21 0428 06/28/21 1401 06/29/21 0357 06/30/21 0946  NA 141 140 140 140  K 3.9 4.1 3.9 3.6  CL 110 111 112* 112*  CO2 '23 22 24 22  '$ GLUCOSE 86 101* 90 132*  BUN 75* 60* 63* 34*  CALCIUM 8.2* 8.0* 8.0* 8.0*  CREATININE 1.42* 1.14* 1.26* 1.02*  GFRNONAA 38* 49* 43* 56*    LIVER FUNCTION TESTS: Recent Labs    08/31/20 1412 06/23/21 1550 06/23/21 1845 06/25/21 2240 06/28/21 1401  BILITOT 0.3 0.4 1.0 0.4 0.3  AST '25 19 28 22 17  '$ ALT '16 12 11 14 9  '$ ALKPHOS 55  --  37* 42 26*  PROT 7.0 6.1 5.3* 6.5 4.2*  ALBUMIN 3.7  --  2.8* 3.4* 2.1*     Assessment and Plan:  81 y.o. female inpatient. 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 Previous gastric bypass surgery.  Admitted on 3.4.23  with left ankle pain related to fall. Patient found to be orthostatic with a nondisplaced closed fracture of the distal left fibula and proximal left third metatarsal. Hospital stay complicated by a r GI bleed. EGD performed on 3.8.23 shows a ulcer at the Kirvin anastomosis of previus gastric bypass surgery. Team is requesting an IVC filter.  Last venous doppler from 1.5.22 and no recent Chest CT. Hgb 1.2, BUN 34, Cr 1.02, Total protein 4.2. GFR 56. All other labs and medications are within acceptable parameters. NKDA. Patient has been NPO since midnight.   Risks and benefits discussed with the patient including, but not limited to bleeding, infection, contrast induced renal failure, filter fracture or migration  which can lead to emergency surgery or even death, strut penetration with damage or irritation to adjacent structures and caval thrombosis. All of the patient's questions were answered, patient is agreeable to proceed. Consent signed and in chart.   Thank you for this interesting consult.  I greatly enjoyed meeting Ann Russell and look forward to participating in their care.  A copy of this report was sent to the requesting provider on this date.  Electronically Signed: Jacqualine Mau, NP 07/01/2021, 10:04 AM   I spent a total of 40 Minutes    in face to face in clinical consultation, greater than 50% of which was counseling/coordinating care  for IVC filter placement

## 2021-07-01 NOTE — Evaluation (Signed)
Physical Therapy Evaluation Patient Details Name: Ann Russell MRN: [id number] DOB: August 15, 1940 Today's Date: 07/01/2021  History of Present Illness  Ann Russell is a 80yoF admitted [facility] under observation for mechanical fall that resulted in L distal fibula and 3rd met nondisplaced fx, managed conservatively, permitted to weightbear in [place]. Pt developed rapid ABLA which required transfer to SDU and PRBC. Pt then taken for Left IVC placement. Pt seen twice in the past week in the ED for syncope without fall, but denies dizziness/lightheadedness with this fall. Significant PMH includes: HTN, CKD 3B, PE on Eliquis, neuropathy. Pt had some AMS earlier in week, improved to a degree on 3/10 for PT re-evaluation.  Clinical Impression  Pt admitted with above diagnosis. Pt currently with functional limitations due to the deficits listed below (see "PT Problem List"). Upon entry, pt in bed, awake and agreeable to participate. The pt is alert, pleasant, interactive, and able to provide info regarding prior level of function, both in tolerance and independence. Mentation is notably more clear than previous PT sessions earlier in week. Pt is assisted with CAM rocker donning. Pt requires minA to EOB  c elevated HOB, then modA to rise to standing, maxA to perform SPT to/from chair for linen change. Pt has persistent and progressive dizziness at EOB which is noted to become orthostatic after several minutes. Pt has brief syncopal episode after SPT to chair. Pt is agreeable that her S/O is unable to physically assist her, and today she requires heavy physical assistance to mobilize. Persistent orthostatic dizziness has been an ongoing recent problem, as has related syncopal episodes. Patient's performance this date reveals decreased ability, independence, and tolerance in performing all basic mobility required for performance of activities of daily living. Pt requires additional DME, close physical  assistance, and cues for safe participate in mobility. Pt will benefit from skilled PT intervention to increase independence and safety with basic mobility in preparation for discharge to the venue listed below.          Recommendations for follow up therapy are one component of a multi-disciplinary discharge planning process, led by the attending physician.  Recommendations may be updated based on patient status, additional functional criteria and insurance authorization.  Follow Up Recommendations Skilled nursing-short term rehab (<3 hours/day) (pt very much anxious about this being a regrettable experience)    Assistance Recommended at Discharge Intermittent Supervision/Assistance  Patient can return home with the following  A lot of help with walking and/or transfers;A lot of help with bathing/dressing/bathroom;Assistance with cooking/housework;Help with stairs or ramp for entrance    Equipment Recommendations None recommended by PT  Recommendations for Other Services       Functional Status Assessment Patient has had a recent decline in their functional status and demonstrates the ability to make significant improvements in function in a reasonable and predictable amount of time.     Precautions / Restrictions Precautions Precautions: Fall Precaution Comments: orthostatic syncope Required Braces or Orthoses: Other Brace Other Brace: Left CAM rocker Restrictions Weight Bearing Restrictions: Yes LLE Weight Bearing: Partial weight bearing      Mobility  Bed Mobility Overal bed mobility: Needs Assistance Bed Mobility: Supine to Sit, Sit to Supine     Supine to sit: Min assist Sit to supine: Max assist   General bed mobility comments: requires maxA back into bed due to presyncope hypotension    Transfers Overall transfer level: Needs assistance   Transfers: Sit to/from Stand, Bed to chair/wheelchair/BSC Sit to  Stand: Mod assist, From elevated surface Stand pivot  transfers: Mod assist, From elevated surface (brief LOC after pivot to chair for bed change)         General transfer comment: tolerates sitting EOB for >10 minutes with persistent dizziness, eventual nausea, gradual progressive BP drop ~73XT systolic.    Ambulation/Gait                  Stairs            Wheelchair Mobility    Modified Rankin (Stroke Patients Only)       Balance                                             Pertinent Vitals/Pain Pain Assessment Pain Assessment: Faces Faces Pain Scale: Hurts little more Pain Location: left ankle with CAM donning Pain Intervention(s): Limited activity within patient's tolerance    Home Living Family/patient expects to be discharged to:: Private residence Living Arrangements: Spouse/significant other Herbie [place] is significant other, physically limited; DTR lives in [place] but works full time) Available Help at Discharge: Family Type of Home: House Home Access: Ramped entrance       Home Layout: One level Home Equipment: Rollator (4 wheels);Cane - single point;BSC/3in1;Shower seat;Grab bars - tub/shower;Engineer, maintenance (IT) (2 wheels)      Prior Function Prior Level of Function : History of Falls (last six months)             Mobility Comments: Mod I with limited community ambulation with rollator (~50% of the time). Hx of multiple falls, with most recent resulting in LLE fx. (pt has been homebound for ~6 months with progressively worsening AMB at home, falls) ADLs Comments: Mod I with ADL's and medication management. Boyfriend assists with transportation and other IADL's.     Hand Dominance        Extremity/Trunk Assessment                Communication      Cognition Arousal/Alertness: Awake/alert Behavior During Therapy: WFL for tasks assessed/performed Overall Cognitive Status: Within Functional Limits for tasks assessed                                  General Comments: more lucid today, able to hold a meaningful conversation, but still feels limited at times        General Comments      Exercises     Assessment/Plan    PT Assessment Patient needs continued PT services  PT Problem List Decreased strength;Decreased range of motion;Decreased activity tolerance;Decreased balance;Decreased mobility;Decreased skin integrity;Cardiopulmonary status limiting activity       PT Treatment Interventions DME instruction;Gait training;Functional mobility training;Therapeutic activities;Therapeutic exercise;Balance training;Neuromuscular re-education;Patient/family education    PT Goals (Current goals can be found in the Care Plan section)  Acute Rehab PT Goals Patient Stated Goal: Be able to perform unassisted transfers bed to/from scooter PT Goal Formulation: With patient Time For Goal Achievement: 07/10/21 Potential to Achieve Goals: Good    Frequency 7X/week     Co-evaluation               AM-PAC PT "6 Clicks" Mobility  Outcome Measure Help needed turning from your back to your side while in a flat bed without using bedrails?: A Lot Help needed  moving from lying on your back to sitting on the side of a flat bed without using bedrails?: A Lot Help needed moving to and from a bed to a chair (including a wheelchair)?: Total Help needed standing up from a chair using your arms (e.g., wheelchair or bedside chair)?: A Lot Help needed to walk in hospital room?: Total Help needed climbing 3-5 steps with a railing? : Total 6 Click Score: 9    End of Session Equipment Utilized During Treatment: Gait belt Activity Tolerance: Treatment limited secondary to medical complications (Comment);Patient tolerated treatment well Patient left: in bed;with call bell/phone within reach;with nursing/sitter in room Nurse Communication: Mobility status PT Visit Diagnosis: Unsteadiness on feet (R26.81);Muscle weakness (generalized)  (M62.81);History of falling (Z91.81);Difficulty in walking, not elsewhere classified (R26.2)    Time: 1116-1206 PT Time Calculation (min) (ACUTE ONLY): 50 min   Charges:   PT Evaluation $PT Re-evaluation: 1 Re-eval PT Treatments $Therapeutic Activity: 23-37 mins       3:55 PM, 07/01/21 Etta Grandchild, PT, DPT Physical Therapist - [facility]  [phone] ([place])    Jammy C 07/01/2021, 3:47 PM

## 2021-07-02 DIAGNOSIS — N390 Urinary tract infection, site not specified: Secondary | ICD-10-CM | POA: Diagnosis not present

## 2021-07-02 DIAGNOSIS — D649 Anemia, unspecified: Secondary | ICD-10-CM | POA: Diagnosis not present

## 2021-07-02 DIAGNOSIS — N1831 Chronic kidney disease, stage 3a: Secondary | ICD-10-CM | POA: Diagnosis not present

## 2021-07-02 DIAGNOSIS — D500 Iron deficiency anemia secondary to blood loss (chronic): Secondary | ICD-10-CM

## 2021-07-02 DIAGNOSIS — K922 Gastrointestinal hemorrhage, unspecified: Secondary | ICD-10-CM | POA: Diagnosis not present

## 2021-07-02 LAB — CBC
HCT: 20.8 % — ABNORMAL LOW (ref 36.0–46.0)
Hemoglobin: 6.7 g/dL — ABNORMAL LOW (ref 12.0–15.0)
MCH: 30.7 pg (ref 26.0–34.0)
MCHC: 32.2 g/dL (ref 30.0–36.0)
MCV: 95.4 fL (ref 80.0–100.0)
Platelets: 169 10*3/uL (ref 150–400)
RBC: 2.18 MIL/uL — ABNORMAL LOW (ref 3.87–5.11)
RDW: 20.2 % — ABNORMAL HIGH (ref 11.5–15.5)
WBC: 5.3 10*3/uL (ref 4.0–10.5)
nRBC: 0 % (ref 0.0–0.2)

## 2021-07-02 LAB — BASIC METABOLIC PANEL
Anion gap: 3 — ABNORMAL LOW (ref 5–15)
BUN: 30 mg/dL — ABNORMAL HIGH (ref 8–23)
CO2: 25 mmol/L (ref 22–32)
Calcium: 7.8 mg/dL — ABNORMAL LOW (ref 8.9–10.3)
Chloride: 113 mmol/L — ABNORMAL HIGH (ref 98–111)
Creatinine, Ser: 1.29 mg/dL — ABNORMAL HIGH (ref 0.44–1.00)
GFR, Estimated: 42 mL/min — ABNORMAL LOW (ref >60)
Glucose, Bld: 123 mg/dL — ABNORMAL HIGH (ref 70–99)
Potassium: 3.5 mmol/L (ref 3.5–5.1)
Sodium: 141 mmol/L (ref 135–145)

## 2021-07-02 LAB — HEMOGLOBIN AND HEMATOCRIT, BLOOD
HCT: 27.9 % — ABNORMAL LOW (ref 36.0–46.0)
Hemoglobin: 8.9 g/dL — ABNORMAL LOW (ref 12.0–15.0)

## 2021-07-02 LAB — PREPARE RBC (CROSSMATCH)

## 2021-07-02 MED ORDER — ADULT MULTIVITAMIN W/MINERALS CH
1.0000 | ORAL_TABLET | Freq: Every day | ORAL | Status: DC
  Administered 2021-07-02 – 2021-07-04 (×3): 1 via ORAL
  Filled 2021-07-02 (×3): qty 1

## 2021-07-02 MED ORDER — SODIUM CHLORIDE 0.9 % IV SOLN
300.0000 mg | Freq: Once | INTRAVENOUS | Status: AC
  Administered 2021-07-02: 300 mg via INTRAVENOUS
  Filled 2021-07-02: qty 300

## 2021-07-02 MED ORDER — CYANOCOBALAMIN 1000 MCG/ML IJ SOLN
1000.0000 ug | Freq: Every day | INTRAMUSCULAR | Status: DC
  Administered 2021-07-02 – 2021-07-04 (×3): 1000 ug via SUBCUTANEOUS
  Filled 2021-07-02 (×3): qty 1

## 2021-07-02 MED ORDER — SODIUM CHLORIDE 0.9% IV SOLUTION: Freq: Once | INTRAVENOUS | Status: DC

## 2021-07-02 MED ORDER — BOOST / RESOURCE BREEZE PO LIQD CUSTOM
1.0000 | Freq: Three times a day (TID) | ORAL | Status: DC
Start: 2021-07-02 — End: 2021-07-04
  Administered 2021-07-02: 1 mL via ORAL
  Administered 2021-07-03 (×2): 1 via ORAL

## 2021-07-02 MED ORDER — SODIUM CHLORIDE 0.9 % IV BOLUS
1000.0000 mL | Freq: Once | INTRAVENOUS | Status: AC
  Administered 2021-07-02: 1000 mL via INTRAVENOUS

## 2021-07-02 NOTE — Progress Notes (Addendum)
Cephas Darby, MD [address]  [place]  [place], [place] [address]  Main: [phone]  Fax: [phone] Pager: [phone]   Subjective: No acute events overnight.  Patient received blood transfusion secondary to drop in hemoglobin 6.7 earlier this morning, received 2 units of PRBCs, improved to 8.9.  Patient denies any abdominal pain, nausea or vomiting.  She was hungry, finished her whole meal.   Objective: Vital signs in last 24 hours: Vitals:   07/02/21 1123 07/02/21 1147 07/02/21 1355 07/02/21 1606  BP: (!) 98/55 111/61 126/82 (!) 151/79  Pulse: 69 61 72 60  Resp: '16 16 15 18  '$ Temp: 98.3 F (36.8 C) 98 F (36.7 C) 98.2 F (36.8 C) 98.6 F (37 C)  TempSrc: Oral Oral Oral   SpO2: 100% 100% 100% 97%  Weight:      Height:       Weight change:   Intake/Output Summary (Last 24 hours) at 07/02/2021 1656 Last data filed at 07/02/2021 1411 Gross per 24 hour  Intake 1498.06 ml  Output 1800 ml  Net -301.94 ml     Exam: Heart:: Regular rate and rhythm, S1S2 present, or without murmur or extra heart sounds Lungs: normal and clear to auscultation Abdomen: soft, nontender, normal bowel sounds   Lab Results: CBC Latest Ref Rng & Units 07/02/2021 07/02/2021 07/01/2021  WBC 4.0 - 10.5 K/uL - 5.3 6.7  Hemoglobin 12.0 - 15.0 g/dL 8.9(L) 6.7(L) 7.2(L)  Hematocrit 36.0 - 46.0 % 27.9(L) 20.8(L) 22.4(L)  Platelets 150 - 400 K/uL - 169 166   CMP Latest Ref Rng & Units 07/02/2021 06/30/2021 06/29/2021  Glucose 70 - 99 mg/dL 123(H) 132(H) 90  BUN 8 - 23 mg/dL 30(H) 34(H) 63(H)  Creatinine 0.44 - 1.00 mg/dL 1.29(H) 1.02(H) 1.26(H)  Sodium 135 - 145 mmol/L 141 140 140  Potassium 3.5 - 5.1 mmol/L 3.5 3.6 3.9  Chloride 98 - 111 mmol/L 113(H) 112(H) 112(H)  CO2 22 - 32 mmol/L '25 22 24  '$ Calcium 8.9 - 10.3 mg/dL 7.8(L) 8.0(L) 8.0(L)  Total Protein 6.5 - 8.1 g/dL - - -  Total Bilirubin 0.3 - 1.2 mg/dL - - -  Alkaline Phos 38 - 126 U/L - - -  AST 15 - 41 U/L - - -  ALT 0 -  44 U/L - - -   Iron/TIBC/Ferritin/ %Sat    Component Value Date/Time   IRON 36 06/28/2021 1401   TIBC 210 (L) 06/28/2021 1401   FERRITIN 22 06/28/2021 1401   IRONPCTSAT 17 06/28/2021 1401   Micro Results: Recent Results (from the past 240 hour(s))  Resp Panel by RT-PCR (Flu A&B, Covid) Nasopharyngeal Swab     Status: None   Collection Time: 06/23/21  5:14 PM   Specimen: Nasopharyngeal Swab; Nasopharyngeal(NP) swabs in vial transport medium  Result Value Ref Range Status   SARS Coronavirus 2 by RT PCR NEGATIVE NEGATIVE Final    Comment: (NOTE) SARS-CoV-2 target nucleic acids are NOT DETECTED.  The SARS-CoV-2 RNA is generally detectable in upper respiratory specimens during the acute phase of infection. The lowest concentration of SARS-CoV-2 viral copies this assay can detect is 138 copies/mL. A negative result does not preclude SARS-Cov-2 infection and should not be used as the sole basis for treatment or other patient management decisions. A negative result may occur with  improper specimen collection/handling, submission of specimen other than nasopharyngeal swab, presence of viral mutation(s) within the areas targeted by this assay, and inadequate number of viral copies(<138  copies/mL). A negative result must be combined with clinical observations, patient history, and epidemiological information. The expected result is Negative.  Fact Sheet for Patients:  EntrepreneurPulse.com.au  Fact Sheet for Healthcare Providers:  IncredibleEmployment.be  This test is no t yet approved or cleared by the [place] FDA and  has been authorized for detection and/or diagnosis of SARS-CoV-2 by FDA under an Emergency Use Authorization (EUA). This EUA will remain  in effect (meaning this test can be used) for the duration of the COVID-19 declaration under Section 564(b)(1) of the Act, 21 U.S.C.section 360bbb-3(b)(1), unless the authorization is  terminated  or revoked sooner.       Influenza A by PCR NEGATIVE NEGATIVE Final   Influenza B by PCR NEGATIVE NEGATIVE Final    Comment: (NOTE) The Xpert Xpress SARS-CoV-2/FLU/RSV plus assay is intended as an aid in the diagnosis of influenza from Nasopharyngeal swab specimens and should not be used as a sole basis for treatment. Nasal washings and aspirates are unacceptable for Xpert Xpress SARS-CoV-2/FLU/RSV testing.  Fact Sheet for Patients: EntrepreneurPulse.com.au  Fact Sheet for Healthcare Providers: IncredibleEmployment.be  This test is not yet approved or cleared by the [place] FDA and has been authorized for detection and/or diagnosis of SARS-CoV-2 by FDA under an Emergency Use Authorization (EUA). This EUA will remain in effect (meaning this test can be used) for the duration of the COVID-19 declaration under Section 564(b)(1) of the Act, 21 U.S.C. section 360bbb-3(b)(1), unless the authorization is terminated or revoked.  Performed at [facility], [address] [place], [place] [address]   Urine Culture     Status: None   Collection Time: 06/26/21  2:57 AM   Specimen: Urine, Clean Catch  Result Value Ref Range Status   Specimen Description   Final    URINE, CLEAN CATCH Performed at [facility], [address], [place], [place] [address]    Special Requests   Final    NONE Performed at [facility], [address] [place], [place] [address]    Culture   Final    NO GROWTH Performed at [facility], [place] [address], [place], [place] [address]    Report Status 06/27/2021 FINAL  Final  Resp Panel by RT-PCR (Flu A&B, Covid) Nasopharyngeal Swab     Status: None   Collection Time: 06/26/21  4:18 AM   Specimen: Nasopharyngeal Swab; Nasopharyngeal(NP) swabs in vial transport medium  Result Value Ref Range Status   SARS Coronavirus 2 by RT PCR NEGATIVE NEGATIVE Final    Comment:  (NOTE) SARS-CoV-2 target nucleic acids are NOT DETECTED.  The SARS-CoV-2 RNA is generally detectable in upper respiratory specimens during the acute phase of infection. The lowest concentration of SARS-CoV-2 viral copies this assay can detect is 138 copies/mL. A negative result does not preclude SARS-Cov-2 infection and should not be used as the sole basis for treatment or other patient management decisions. A negative result may occur with  improper specimen collection/handling, submission of specimen other than nasopharyngeal swab, presence of viral mutation(s) within the areas targeted by this assay, and inadequate number of viral copies(<138 copies/mL). A negative result must be combined with clinical observations, patient history, and epidemiological information. The expected result is Negative.  Fact Sheet for Patients:  EntrepreneurPulse.com.au  Fact Sheet for Healthcare Providers:  IncredibleEmployment.be  This test is no t yet approved or cleared by the [place] FDA and  has been authorized for detection and/or diagnosis of SARS-CoV-2 by  FDA under an Emergency Use Authorization (EUA). This EUA will remain  in effect (meaning this test can be used) for the duration of the COVID-19 declaration under Section 564(b)(1) of the Act, 21 U.S.C.section 360bbb-3(b)(1), unless the authorization is terminated  or revoked sooner.       Influenza A by PCR NEGATIVE NEGATIVE Final   Influenza B by PCR NEGATIVE NEGATIVE Final    Comment: (NOTE) The Xpert Xpress SARS-CoV-2/FLU/RSV plus assay is intended as an aid in the diagnosis of influenza from Nasopharyngeal swab specimens and should not be used as a sole basis for treatment. Nasal washings and aspirates are unacceptable for Xpert Xpress SARS-CoV-2/FLU/RSV testing.  Fact Sheet for Patients: EntrepreneurPulse.com.au  Fact Sheet for Healthcare  Providers: IncredibleEmployment.be  This test is not yet approved or cleared by the [place] FDA and has been authorized for detection and/or diagnosis of SARS-CoV-2 by FDA under an Emergency Use Authorization (EUA). This EUA will remain in effect (meaning this test can be used) for the duration of the COVID-19 declaration under Section 564(b)(1) of the Act, 21 U.S.C. section 360bbb-3(b)(1), unless the authorization is terminated or revoked.  Performed at [facility], [address] [place], [place] [address]   MRSA Next Gen by PCR, Nasal     Status: None   Collection Time: 06/28/21  3:48 PM   Specimen: Nasal Mucosa; Nasal Swab  Result Value Ref Range Status   MRSA by PCR Next Gen NOT DETECTED NOT DETECTED Final    Comment: (NOTE) The GeneXpert MRSA Assay (FDA approved for NASAL specimens only), is one component of a comprehensive MRSA colonization surveillance program. It is not intended to diagnose MRSA infection nor to guide or monitor treatment for MRSA infections. Test performance is not FDA approved in patients less than 24 years old. Performed at [facility], [address] [place], [place] [address]    Studies/Results: IR IVC FILTER PLMT / S&I Burke Keels GUID/MOD SED  Result Date: 07/01/2021 INDICATION: History of PE on Eliquis.  Acute GI bleed.  Unable to anticoagulate. EXAM: Procedures: 1. ULTRASOUND GUIDANCE FOR VASCULAR ACCESS 2. INFERIOR VENA CAVA VENOGRAM 3. INFERIOR VENA CAVA FILTER PLACEMENT COMPARISON:  CT AP, 06/29/2021. MEDICATIONS: None. ANESTHESIA/SEDATION: Fentanyl 50 mcg IV; Versed 1 mg IV Sedation Time: 32 minutes; The patient was continuously monitored during the procedure by the interventional radiology nurse under my direct supervision. CONTRAST:  67m OMNIPAQUE IOHEXOL 350 MG/ML SOLN FLUOROSCOPY TIME:  3 minutes 12 seconds (130.1mGy) COMPLICATIONS: None immediate PROCEDURE: Informed consent was obtained from the  patient and/or patient's representative following explanation of the procedure, risks, benefits and alternatives. The patient understands, agrees and consents for the procedure. All questions were addressed. A time out was performed prior to the initiation of the procedure. Maximal barrier sterile technique utilized including caps, mask, sterile gowns, sterile gloves, large sterile drape, hand hygiene, and Betadine prep. Under sterile condition and local anesthesia, right internal jugular venous access was performed with ultrasound. An ultrasound image was saved and sent to PACS. Over a guidewire, the IVC filter delivery sheath and inner dilator were advanced into the IVC just above the IVC bifurcation. Contrast injection was performed for an IVC venogram. Through the delivery sheath, a retrievable Argon Option ELITE IVC filter was deployed below the level of the renal veins and above the IVC bifurcation. Limited post deployment venacavagram was performed. The delivery sheath was removed and hemostasis was obtained with manual compression. A dressing was placed. The patient tolerated the  procedure well without immediate post procedural complication. FINDINGS: The IVC is patent. No evidence of thrombus, stenosis, or occlusion. No variant venous anatomy. IMPRESSION: Successful placement of a retrievable, infrarenal IVC filter via right jugular approach. PLAN: IVC filters can cause complications when left in place for extended periods of time. If medically appropriate, recommend discontinuing filter prior to discharge. Please re-evaluate the patient for filter discontinuation when they are seen in follow up, and refer patient to Interventional Radiology for removal. Michaelle Birks, MD [facility] Radiology Electronically Signed   By: Michaelle Birks M.D.   On: 07/01/2021 21:12   Medications: I have reviewed the patient's current medications. Prior to Admission:  Medications  Prior to Admission  Medication Sig Dispense Refill Last Dose   acetaminophen (TYLENOL) 650 MG CR tablet Take 650 mg by mouth 2 (two) times a day.   06/25/2021 at unknown   apixaban (ELIQUIS) 5 MG TABS tablet Take 1 tablet (5 mg total) by mouth 2 (two) times daily. 180 tablet 1 06/25/2021 at [place] 1 tablet under the tongue daily.   Past Week at unknown   busPIRone (BUSPAR) 15 MG tablet Take 1 tablet (15 mg total) by mouth 2 (two) times daily. 180 tablet 0 06/25/2021 at 2000   calcitRIOL (ROCALTROL) 0.25 MCG capsule Take 1 capsule (0.25 mcg total) by mouth daily. 90 capsule 1 06/25/2021 at 0800   Calcium Carb-Cholecalciferol (CALCIUM 600 + D PO) Take 1 tablet by mouth daily.   06/25/2021 at 0800   denosumab (PROLIA) 60 MG/ML SOSY injection Inject 60 mg into the skin every 6 (six) months.   unknown at unknown   ferrous [id number] C-folic acid (TRINSICON / FOLTRIN) capsule Take 1 capsule by mouth 2 (two) times daily. 20 capsule  06/25/2021 at 2000   FLUoxetine (PROZAC) 20 MG capsule Take 3 capsules (60 mg total) by mouth every morning. (Patient taking differently: Take 20 mg by mouth 3 (three) times daily.) 270 capsule 0 06/25/2021 at 2000   Multiple Vitamin (MULTI-VITAMINS) TABS Take 2 tablets by mouth 2 (two) times daily.   06/25/2021 at 2000   omeprazole (PRILOSEC) 20 MG capsule Take 1 capsule (20 mg total) by mouth daily. 14 capsule 0 06/25/2021 at 0800   pregabalin (LYRICA) 100 MG capsule Take 1 capsule (100 mg total) by mouth 3 (three) times daily. 270 capsule 0 06/25/2021 at 0800   Pyridoxine HCl (VITAMIN B6 PO) Take 1 tablet by mouth daily. With b12   06/25/2021 at 0800   valACYclovir (VALTREX) 1000 MG tablet Take 1 tablet (1,000 mg total) by mouth daily as needed (fever blisters). 30 tablet 2 prn at prn   bisacodyl (DULCOLAX) 5 MG EC tablet Take 1 tablet (5 mg total) by mouth daily as needed for moderate constipation. (Patient not taking: Reported on 06/26/2021) 30 tablet 0 Not  Taking   HYDROcodone-acetaminophen (NORCO/VICODIN) 5-325 MG tablet Take 1 tablet by mouth every 8 (eight) hours as needed for moderate pain. (Patient not taking: Reported on 06/26/2021) 15 tablet 0 Not Taking   Scheduled:  sodium chloride   Intravenous Once   busPIRone  15 mg Oral BID   calcitRIOL  0.25 mcg Oral Daily   Chlorhexidine Gluconate Cloth  6 each Topical Daily   cyanocobalamin  1,000 mcg Subcutaneous Daily   feeding supplement  1 Container Oral TID BM   FLUoxetine  60 mg Oral BH-q7a   midodrine  5 mg Oral TID WC  multivitamin with minerals  1 tablet Oral Daily   pantoprazole  40 mg Intravenous Q12H   polyethylene glycol  17 g Oral BID   pregabalin  100 mg Oral TID   sucralfate  1 g Oral TID WC & HS   Continuous:  iron sucrose     JOI:[id number] **OR** acetaminophen, HYDROcodone-acetaminophen, iohexol, labetalol, ondansetron **OR** ondansetron (ZOFRAN) IV Anti-infectives (From admission, onward)    Start     Dose/Rate Route Frequency Ordered Stop   06/27/21 0400  cefTRIAXone (ROCEPHIN) 1 g in sodium chloride 0.9 % 100 mL IVPB        1 g 200 mL/hr over 30 Minutes Intravenous Every 24 hours 06/26/21 0418 06/28/21 2238   06/26/21 0345  cefTRIAXone (ROCEPHIN) 1 g in sodium chloride 0.9 % 100 mL IVPB        1 g 200 mL/hr over 30 Minutes Intravenous  Once 06/26/21 0342 06/26/21 0455      Scheduled Meds:  sodium chloride   Intravenous Once   busPIRone  15 mg Oral BID   calcitRIOL  0.25 mcg Oral Daily   Chlorhexidine Gluconate Cloth  6 each Topical Daily   cyanocobalamin  1,000 mcg Subcutaneous Daily   feeding supplement  1 Container Oral TID BM   FLUoxetine  60 mg Oral BH-q7a   midodrine  5 mg Oral TID WC   multivitamin with minerals  1 tablet Oral Daily   pantoprazole  40 mg Intravenous Q12H   polyethylene glycol  17 g Oral BID   pregabalin  100 mg Oral TID   sucralfate  1 g Oral TID WC & HS   Continuous Infusions:  iron sucrose     PRN Meds:.acetaminophen  **OR** acetaminophen, HYDROcodone-acetaminophen, iohexol, labetalol, ondansetron **OR** ondansetron (ZOFRAN) IV   Assessment: Principal Problem:   UTI (urinary tract infection) Active Problems:   Hypertension   Symptomatic anemia   History of pulmonary embolism   Neuropathy   Frequent falls   Chronic kidney disease, stage 3a (HCC)   Closed fibular fracture   Hypotension   Demand ischemia (HCC)   Neurological deficit present  Severe iron deficiency anemia secondary to bleeding from anastomotic ulcer s/p EGD on 3/8 with hemostasis by cautery and epinephrine  Plan: Severe symptomatic iron deficiency anemia: Patient responded appropriately to blood transfusion Continue Protonix 40 mg IV twice daily and switch to omeprazole 40 mg p.o. 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 Vicente Males within 6 to 8 weeks upon discharge, anticipate home tomorrow Repeat EGD in 8 weeks to confirm healing of the ulcer Eliquis has been discontinued in setting of large anastomotic ulcer and IVC filter has been placed on 3/10. After repeat EGD in 8weeks, if healing of the ulcer has been confirmed, IVC filter can be removed and eliquis could be resumed if deemed necessary     LOS: 5 days   Hailey Miles 07/02/2021, 4:56 PM

## 2021-07-02 NOTE — Progress Notes (Signed)
[facility] Progress Note ? ?Patient: Ann Russell    TZG:[id number]  DOA: 06/25/2021    ?Date of Service: the patient was seen and examined on 07/02/2021 ? 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  Patient also found to have an incidental urinary tract infection.  Posterior splint applied and patient seen by podiatry who recommended boot and partial weightbearing with no need for surgery at this time.  Patient started on IV antibiotics for urinary infection.  She had been noted to have episodes of significant orthostatic hypotension and frequent falls at home.   ? ?On 3/7, patient noted to suddenly have symptomatic significant anemia with a drop to 4 and once confirmed, patient underwent transfusion.  CT of abdomen pelvis unremarkable and GI consulted who took patient for EGD on 3/8.  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  Suspected that this was having chronic bleeding while on Eliquis with addition of acute bleed.  Eliquis now on hold.  Patient is near nonambulatory status even prior to this fall and so now that she is off of blood thinner, concerned about high risk for DVT.  Interventional radiology consulted and patient had IVC filter placed on 3/10.  On morning of 3/11, patient mildly hypotensive and hemoglobin which was 7.2-day prior, now at 6.7.  Patient received 1 unit packed red blood cells and posttransfusion hemoglobin 8.9.  Blood pressure posttransfusion stable. ? ?Assessment and Plan: ?Assessment and Plan: ?* UTI  (urinary tract infection) ?Urine culture without growth. Completed 3 doses of ceftriaxone ? ?Symptomatic anemia ?7 drop in hemoglobin felt to be secondary to bleeding ulcer from [place] anastomosis both acute and chronic.  Patient has been on Eliquis.  Appreciate gastroenterology help.  Ulcer cauterized and treated with epinephrine.  Completed IV PPI drip on afternoon of 3/10.  Discussed with GI and likely would not recommend restarting Eliquis at least for the another month.  Following EGD, hemoglobin slowly trending back down and patient status post 1 unit packed red blood cells.  We will continue to monitor to ensure she is having no further bleeding. ? ?Closed fibular fracture ?On the left. Nondisplaced. Closed. Also with nondisplaced proximal 3rd metatarsal fracture.  ?- Discussed with Dr. Vickki Muff, podiatry, who confirms nonoperative management plan. Recommends trial of partial weight bearing (25-50%) LLE with CAM boot which has been applied. No need for splint. ?Physical therapy to see above they will likely recommended short-term skilled nursing.  Patient lives at home with her boyfriend.  She tells me however that she does not want to go to skilled nursing and she also tells me that even prior to this fall she is only minimally ambulated due to bad peripheral neuropathy.  She would like to go home with home health physical therapy and I suspect that she will become almost fully nonambulatory which she understands and he is okay with. ? ?Frequent falls ?Likely multifactorial related to hypotension, neuropathy, physical deconditioning, UTI.  We will continue treating orthostatic hypotension.  Refuses skilled nursing, see below.  PT and OT have seen patient.  Still having some episodes of dizziness which will need to get better. ? ?Hypotension ?BP with systolic in the 27O on arrival. Also several recent ED trips for syncope PTA. Persistent orthostasis here despite adequate IV fluid resuscitation. Echo performed today  shows preserved LVEF 55-60%, G1DD associated with mild LVH, small circumferential pericardial effusion without mention of diastolic collapse/tamponade.  ?- Started TED hose, midodrine.  Bleeding may have played a partial role in this. ? ?Neuropathy ?Uses assistive devices, wheelchair, scooter, walker.  Given her now almost nonambulatory status and the fact that she is no longer on blood thinning medication (at least for the immediate future if not permanently), patient is at high risk for a blood clot.  Status post IVC filter placed 3/10. ? ?Chronic kidney disease, stage 3a ([place]) ?Presented with AKI with creatinine 1.75, down to 1.02, but then started to trend back upward possibly from blood loss.  Recheck in the morning. ? ?Demand ischemia ([place]) ?Mild troponin elevation is down-trending without anginal symptoms or ischemic ECG changes. No definite RWMA on suboptimal echo.  ?- No further intervention currently planned. ? ?Hypertension ?Currently having severe orthostatic hypotension. Not on anti-HTN medications PTA. ? ?Neurological deficit present ?Reports impaired sensation in fingers bilaterally. No neck pain or other evidence of radiculopathy. CT head ordered and reviewed which shows no bleed or other acute changes. If symptoms persist despite normalization of blood pressure and clinical stability, would pursue further work up.  ? ?History of pulmonary embolism ?- We have to hold eliquis for now. Note normal RV size/function on echo.  Status post IVC filter ? ? ? ? ? ? ?Body mass index is 25.01 kg/m?.  ?  ?   ? ?Consultants: ?Podiatry ?Gastroenterology ?Interventional radiology ? ?Procedures: ?Status post EGD done 3/8 ?IVC filter placement 3/10 ? ?Antimicrobials: ?IV Rocephin x3 doses ? ?Code Status: Full code ? ? ?Subjective: Patient felt a little lightheaded, prior to getting blood. ? ?Objective: ?Noted hypotension, better following blood transfusion. ?Vitals:  ? 07/02/21 1147 07/02/21 1355  ?BP: 111/61 126/82   ?Pulse: 61 72  ?Resp: 16 15  ?Temp: 98 ?F (36.7 ?C) 98.2 ?F (36.8 ?C)  ?SpO2: 100% 100%  ? ? ?Intake/Output Summary (Last 24 hours) at 07/02/2021 1545 ?Last data filed at 07/02/2021 1411 ?Gross per 24 hour  ?Intake 1498.06 ml  ?Output 1800 ml  ?Net -301.94 ml  ? ? ?Filed Weights  ? 06/25/21 2224 06/26/21 2055  ?Weight: 68 kg 74.6 kg  ? ?Body mass index is 25.01 kg/m?. ? ?Exam: ? ?General: Alert and oriented x3, fatigued ?HEENT: Normocephalic and atraumatic, mucous membranes are slightly dry ?Cardiovascular: Regular rate and rhythm, S1-S2 ?Respiratory: Clear to auscultation bilaterally ?Abdomen: Soft, nontender, nondistended, positive bowel sounds ?Musculoskeletal: 1+ pitting edema from the knees down bilaterally ?Skin: No skin breaks, tears or lesions ?Psychiatry: Appropriate, no evidence of psychoses ?Neurology: significantly decreased sensation in lower extremities due to neuropathy ? ?Data Reviewed: ?Hemoglobin down to 6.7 prior to transfusion. ? ?Disposition:  ?Status is: Inpatient ?Remains inpatient appropriate because: Ensuring no further bleeding and tolerate working with PT. ?  ? ?Family Communication: Left message for daughter ?DVT Prophylaxis: ?Place TED hose Start: 06/27/21 1239 ? ? ? ?Author: ?Annita Brod ,MD ?07/02/2021 3:45 PM ? ?To reach On-call, see care teams to locate the attending and reach out via [email or web address]. ?Between 7PM-7AM, please contact night-coverage ?If you still have difficulty reaching the attending provider, please page the [facility] (Director on Call) for [facility] on amion for assistance. ? ?

## 2021-07-02 NOTE — Progress Notes (Signed)
PT Cancellation Note ? ?Patient Details ?Name: Ann Russell ?MRN: [id number] ?DOB: Jan 03, 1941 ? ? ?Cancelled Treatment:    Reason Eval/Treat Not Completed: Other (comment).  Pt was unwilling to do therapy due to nausea and being unwell this AM.  Declined bed ex and will retry later today. ? ? ?Ramond Dial ?07/02/2021, 11:28 AM ? ?Mee Hives, PT PhD ?Acute Rehab Dept. Number: [facility] [phone] and [place] [phone] ? ?

## 2021-07-02 NOTE — Progress Notes (Signed)
Physical Therapy Treatment ?Patient Details ?Name: Ann Russell ?MRN: [id number] ?DOB: 04-02-1941 ?Today's Date: 07/02/2021 ? ? ?History of Present Illness Ann Russell is a 12yoF admitted to [facility] on 06/25/21 under observation for mechanical fall that resulted in L distal fibula and 3rd met nondisplaced fx, managed conservatively, permitted to weightbear in camro[place]. Pt developed rapid ABLA which required transfer to SDU and PRBC. Pt then taken for Left IVC placement. Pt seen twice in the past week in the ED for syncope without fall, but denies dizziness/lightheadedness with this fall. Significant PMH includes: HTN, CKD 3B, PE on Eliquis, neuropathy. Pt had some AMS earlier in week, but has demonstrated improvement with cognition since. ? ?  ?PT Comments  ? ? Pt was seen for bed exercise after declining PT mobility for nausea.  Pt demonstrates a limited awareness of her permission to use LLE, and spent time educating her on the need to work on ROM as her injuries permit on LLE.  Pt is getting through ex after lunch, and then suddenly was complaining of nausea.  Pt is allowed to rest, and agreed to return next time to hopefully stand and get to chair.  Follow up for acute PT goals as outlined on POC.  ?Recommendations for follow up therapy are one component of a multi-disciplinary discharge planning process, led by the attending physician.  Recommendations may be updated based on patient status, additional functional criteria and insurance authorization. ? ?Follow Up Recommendations ? Skilled nursing-short term rehab (<3 hours/day) ?  ?  ?Assistance Recommended at Discharge Intermittent Supervision/Assistance  ?Patient can return home with the following A lot of help with walking and/or transfers;A lot of help with bathing/dressing/bathroom;Assistance with cooking/housework;Help with stairs or ramp for entrance ?  ?Equipment Recommendations ? None recommended by PT  ?  ?Recommendations for Other Services OT consult ? ? ?   ?Precautions / Restrictions Precautions ?Precautions: Fall ?Precaution Comments: orthostatic syncope ?Required Braces or Orthoses: Other Brace ?Other Brace: Left CAM rocker ?Restrictions ?Weight Bearing Restrictions: Yes ?LLE Weight Bearing: Partial weight bearing ?LLE Partial Weight Bearing Percentage or Pounds: 25/50  ?  ? ?Mobility ? Bed Mobility ?Overal bed mobility: Needs Assistance ?  ?  ?  ?  ?  ?  ?General bed mobility comments: declines to try ?  ? ?Transfers ?  ?  ?  ?  ?  ?  ?  ?  ?  ?  ?  ? ?Ambulation/Gait ?  ?  ?  ?  ?  ?  ?  ?  ? ? ?Stairs ?  ?  ?  ?  ?  ? ? ?Wheelchair Mobility ?  ? ?Modified Rankin (Stroke Patients Only) ?  ? ? ?  ?Balance   ?  ?  ?  ?  ?  ?  ?  ?  ?  ?  ?  ?  ?  ?  ?  ?  ?  ?  ?  ? ?  ?Cognition Arousal/Alertness: Awake/alert ?Behavior During Therapy: [facility] for tasks assessed/performed ?Overall Cognitive Status: Within Functional Limits for tasks assessed ?  ?  ?  ?  ?  ?  ?  ?  ?  ?  ?  ?  ?  ?  ?  ?  ?General Comments: reports she is not feeling well enough to get up to side of bed, to do any real movement ?  ?  ? ?  ?Exercises General Exercises - Lower Extremity ?Ankle Circles/Pumps: AROM, AAROM,  5 reps ?Quad Sets: AROM, 10 reps ?Gluteal Sets: AROM, 10 reps ?Heel Slides: AROM, 10 reps ?Hip ABduction/ADduction: AROM, AAROM, 10 reps ? ?  ?General Comments General comments (skin integrity, edema, etc.): pt was in bed and feeling nauseated earlier, after lunch reports eating a lot and fast after not eating several days.  Pt is nauseated after trying to do LE bed ex ?  ?  ? ?Pertinent Vitals/Pain Pain Assessment ?Pain Assessment: Faces ?Faces Pain Scale: Hurts little more ?Pain Location: L ankle with movement ?Pain Descriptors / Indicators: Guarding, Grimacing ?Pain Intervention(s): Monitored during session, Repositioned  ? ? ?Home Living Family/patient expects to be discharged to:: Private residence ?Living Arrangements: Spouse/significant other Herbie [place] is significant other,  physically limited; DTR lives in [place] but works full time) ?Available Help at Discharge: Family ?Type of Home: House ?Home Access: Ramped entrance ?  ?  ?  ?Home Layout: One level ?Home Equipment: Rollator (4 wheels);Cane - single point;BSC/3in1;Shower seat;Grab bars - tub/shower;Engineer, maintenance (IT) (2 wheels) ?   ?  ?Prior Function    ?  ?  ?   ? ?PT Goals (current goals can now be found in the care plan section) Acute Rehab PT Goals ?Patient Stated Goal: get up alone ?Progress towards PT goals: Not progressing toward goals - comment ? ?  ?Frequency ? ? ? 7X/week ? ? ? ?  ?PT Plan Current plan remains appropriate  ? ? ?Co-evaluation   ?  ?  ?  ?  ? ?  ?AM-PAC PT "6 Clicks" Mobility   ?Outcome Measure ? Help needed turning from your back to your side while in a flat bed without using bedrails?: A Lot ?Help needed moving from lying on your back to sitting on the side of a flat bed without using bedrails?: A Lot ?Help needed moving to and from a bed to a chair (including a wheelchair)?: A Lot ?Help needed standing up from a chair using your arms (e.g., wheelchair or bedside chair)?: A Lot ?Help needed to walk in hospital room?: Total ?Help needed climbing 3-5 steps with a railing? : Total ?6 Click Score: 10 ? ?  ?End of Session   ?Activity Tolerance: Treatment limited secondary to medical complications (Comment) ?Patient left: in bed;with call bell/phone within reach;with nursing/sitter in room ?Nurse Communication: Mobility status ?PT Visit Diagnosis: Unsteadiness on feet (R26.81);Muscle weakness (generalized) (M62.81);History of falling (Z91.81);Difficulty in walking, not elsewhere classified (R26.2) ?  ? ? ?Time: 9444-6190 ?PT Time Calculation (min) (ACUTE ONLY): 25 min ? ?Charges:  $Therapeutic Exercise: 23-37 mins       ?Ramond Dial ?07/02/2021, 2:21 PM ? ?Mee Hives, PT PhD ?Acute Rehab Dept. Number: [facility] [phone] and [place] [phone] ? ? ?

## 2021-07-02 NOTE — Evaluation (Signed)
Occupational Therapy Evaluation Patient Details Name: Ann Russell MRN: [id number] DOB: October 29, 1940 Today's Date: 07/02/2021   History of Present Illness Ann Russell is a 80yoF admitted [facility] on 06/25/21 under observation for mechanical fall that resulted in L distal fibula and 3rd met nondisplaced fx, managed conservatively, permitted to weightbear in [place]ro[place]. Pt developed rapid ABLA which required transfer to SDU and PRBC. Pt then taken for Left IVC placement. Pt seen twice in the past week in the ED for syncope without fall, but denies dizziness/lightheadedness with this fall. Significant PMH includes: HTN, CKD 3B, PE on Eliquis, neuropathy. Pt had some AMS earlier in week, but has demonstrated improvement with cognition since.   Clinical Impression   [name] was seen for OT Re-Evaluation this date. Pt now back on telemetry unit, with improved cognition and overall status. She is now s/p IVC placement and continues to be functionally limited by generalized weakness, decreased activity tolerance, decreased safety awareness and decreased functional use of her LLE with orders for it to remain PWB and in CAM Boot for any mobility attempts. Pt unsafe/unable to attempt OOB/EOB activity this date 2/2 soft BP. Pt symptomatic and c/o of feeling woozy even with minimal positional changes (bed to chair position). Pt satisfied that she was able to self-feed her full breakfast tray without physical assist. She endorses that while her arms feel weak, she feels like she is getting stronger now that she is out of the CCU. Pt continues to adamantly decline STR, educated on considerations for DC to STR including improved safety and functional independence with increased therapy frequency. Pt verbalizes understanding. Pt continues to benefit from skilled OT services. POC updated to reflect current functional needs/goals. Frequency and DC rec for STR remain appropriate.      Recommendations for follow up therapy are  one component of a multi-disciplinary discharge planning process, led by the attending physician.  Recommendations may be updated based on patient status, additional functional criteria and insurance authorization.   Follow Up Recommendations  Skilled nursing-short term rehab (<3 hours/day)    Assistance Recommended at Discharge Frequent or constant Supervision/Assistance  Patient can return home with the following A lot of help with walking and/or transfers;A lot of help with bathing/dressing/bathroom;Help with stairs or ramp for entrance    Functional Status Assessment  Patient has had a recent decline in their functional status and demonstrates the ability to make significant improvements in function in a reasonable and predictable amount of time.  Equipment Recommendations  Other (comment) (Defer to next venue of care.)    Recommendations for Other Services       Precautions / Restrictions Precautions Precautions: Fall Precaution Comments: orthostatic syncope Required Braces or Orthoses: Other Brace Other Brace: Left CAM rocker Restrictions Weight Bearing Restrictions: Yes LLE Weight Bearing: Partial weight bearing LLE Partial Weight Bearing Percentage or Pounds: 25/50      Mobility Bed Mobility Overal bed mobility: Needs Assistance             General bed mobility comments: deferred 2/2 hypotension with minimial positional change.    Transfers                          Balance Overall balance assessment: Needs assistance  ADL either performed or assessed with clinical judgement   ADL Overall ADL's : Needs assistance/impaired                                       General ADL Comments: Pt unable to tolerate sitting EOB d/t worsening "wooziness" and soft BP however anticipate able to complete UB ADLs with SUPERVISION while seated EOB. MAX +2 for LB ADL management including LB  dressing, toilet transfers, and LB bathing.     Vision Baseline Vision/History: 1 Wears glasses Ability to See in Adequate Light: 0 Adequate Patient Visual Report: No change from baseline       Perception     Praxis      Pertinent Vitals/Pain Pain Assessment Pain Assessment: No/denies pain (endorses feeling "woozy")     Hand Dominance     Extremity/Trunk Assessment Upper Extremity Assessment Upper Extremity Assessment: Generalized weakness   Lower Extremity Assessment Lower Extremity Assessment: Generalized weakness;Defer to PT evaluation LLE Deficits / Details: LLE PWB with CAM boot on for any mobility attempts. LLE Sensation: history of peripheral neuropathy       Communication Communication Communication: No difficulties   Cognition Arousal/Alertness: Awake/alert Behavior During Therapy: WFL for tasks assessed/performed Overall Cognitive Status: Within Functional Limits for tasks assessed                                 General Comments: A&Ox4 able to state that today is her birthday. Knows reason for current hospitalization and is able to follow VCs t/o session. Endorses feeling "woozy" and "not quite like myself".     General Comments  Pt vitals monitored during session. She is recieved semi-supine in bed with BP 101/43; with bed in chair position feet lowered and HOB elevated BP noted to be 96/45, pt complains of increased dizziness upon maintining this position so this Thereasa Parkin returns her to semi-supine and BP noted to be 87/42. RN notified.    Exercises Other Exercises Other Exercises: Extensive convesation with pt on DC recs including considerations for DC with HH OT vs. OT at STR. Pt adamantly declining STR, discussed safety considerations for DC home given limited physical assistance she would have available. Pt voices plans to "call my friends to see what they can do". Educated on potential need for hoyer lift and hospital bed if pt were to DC  home. Pt agreeable.   Shoulder Instructions      Home Living Family/patient expects to be discharged to:: Private residence Living Arrangements: Spouse/significant other Ann Russell is significant other, physically limited; DTR lives in [place] but works full time) Available Help at Discharge: Family Type of Home: House Home Access: Ramped entrance     Home Layout: One level     Bathroom Shower/Tub: Producer, television/film/video: Handicapped height Bathroom Accessibility: Yes   Home Equipment: Rollator (4 wheels);Cane - single point;BSC/3in1;Shower seat;Grab bars - tub/shower;Insurance risk surveyor (2 wheels)          Prior Functioning/Environment Prior Level of Function : History of Falls (last six months)             Mobility Comments: Mod I with limited community ambulation with rollator (~50% of the time). Hx of multiple falls, with most recent resulting in LLE fx. (pt has been homebound for ~6 months with progressively worsening AMB at home, falls)  ADLs Comments: Mod I with ADL's and medication management. Boyfriend assists with transportation and other IADL's.        OT Problem List: Decreased strength;Decreased range of motion;Impaired balance (sitting and/or standing);Decreased activity tolerance;Decreased knowledge of precautions;Decreased knowledge of use of DME or AE;Pain;Decreased safety awareness;Cardiopulmonary status limiting activity      OT Treatment/Interventions: Self-care/ADL training;Therapeutic exercise;Energy conservation;DME and/or AE instruction;Therapeutic activities;Patient/family education;Balance training    OT Goals(Current goals can be found in the care plan section) Acute Rehab OT Goals Patient Stated Goal: To go home and sit on my deck OT Goal Formulation: With patient Time For Goal Achievement: 07/16/21 Potential to Achieve Goals: Fair  OT Frequency: Min 2X/week    Co-evaluation              AM-PAC OT "6 Clicks"  Daily Activity     Outcome Measure Help from another person eating meals?: None Help from another person taking care of personal grooming?: A Little Help from another person toileting, which includes using toliet, bedpan, or urinal?: A Lot Help from another person bathing (including washing, rinsing, drying)?: A Lot Help from another person to put on and taking off regular upper body clothing?: A Little Help from another person to put on and taking off regular lower body clothing?: A Lot 6 Click Score: 16   End of Session Equipment Utilized During Treatment: Gait belt;Rolling walker (2 wheels) Nurse Communication: Mobility status;Other (comment)  Activity Tolerance: Treatment limited secondary to medical complications (Comment) (BP) Patient left: in bed;with call bell/phone within reach  OT Visit Diagnosis: Unsteadiness on feet (R26.81);Repeated falls (R29.6);Muscle weakness (generalized) (M62.81);Pain Pain - Right/Left: Left Pain - part of body: Leg                Time: 1610-9604 OT Time Calculation (min): 24 min Charges:  OT General Charges $OT Visit: 1 Visit OT Evaluation $OT Re-eval: 1 Re-eval OT Treatments $Self Care/Home Management : 8-22 mins  Rockney Ghee, M.S., OTR/L Feeding Team - [facility] Ascom: [phone] 07/02/21, 12:02 PM

## 2021-07-03 DIAGNOSIS — K5900 Constipation, unspecified: Secondary | ICD-10-CM | POA: Diagnosis not present

## 2021-07-03 DIAGNOSIS — K250 Acute gastric ulcer with hemorrhage: Secondary | ICD-10-CM | POA: Diagnosis not present

## 2021-07-03 DIAGNOSIS — N1831 Chronic kidney disease, stage 3a: Secondary | ICD-10-CM | POA: Diagnosis not present

## 2021-07-03 DIAGNOSIS — R296 Repeated falls: Secondary | ICD-10-CM | POA: Diagnosis not present

## 2021-07-03 LAB — TYPE AND SCREEN
ABO/RH(D): A POS
Antibody Screen: NEGATIVE
Unit division: 0

## 2021-07-03 LAB — CBC
HCT: 26.8 % — ABNORMAL LOW (ref 36.0–46.0)
Hemoglobin: 8.9 g/dL — ABNORMAL LOW (ref 12.0–15.0)
MCH: 30.9 pg (ref 26.0–34.0)
MCHC: 33.2 g/dL (ref 30.0–36.0)
MCV: 93.1 fL (ref 80.0–100.0)
Platelets: 192 10*3/uL (ref 150–400)
RBC: 2.88 MIL/uL — ABNORMAL LOW (ref 3.87–5.11)
RDW: 19.9 % — ABNORMAL HIGH (ref 11.5–15.5)
WBC: 5.3 10*3/uL (ref 4.0–10.5)
nRBC: 0 % (ref 0.0–0.2)

## 2021-07-03 LAB — BASIC METABOLIC PANEL
Anion gap: 4 — ABNORMAL LOW (ref 5–15)
BUN: 22 mg/dL (ref 8–23)
CO2: 22 mmol/L (ref 22–32)
Calcium: 8 mg/dL — ABNORMAL LOW (ref 8.9–10.3)
Chloride: 112 mmol/L — ABNORMAL HIGH (ref 98–111)
Creatinine, Ser: 1.09 mg/dL — ABNORMAL HIGH (ref 0.44–1.00)
GFR, Estimated: 51 mL/min — ABNORMAL LOW (ref >60)
Glucose, Bld: 87 mg/dL (ref 70–99)
Potassium: 3.6 mmol/L (ref 3.5–5.1)
Sodium: 138 mmol/L (ref 135–145)

## 2021-07-03 LAB — BPAM RBC
Blood Product Expiration Date: 202304042359
ISSUE DATE / TIME: 202303111128
Unit Type and Rh: 6200

## 2021-07-03 LAB — AMMONIA: Ammonia: 11 umol/L (ref 9–35)

## 2021-07-03 MED ORDER — BISACODYL 5 MG PO TBEC: 5.0000 mg | DELAYED_RELEASE_TABLET | Freq: Every day | ORAL | Status: DC | PRN

## 2021-07-03 MED ORDER — LACTULOSE 10 GM/15ML PO SOLN
10.0000 g | ORAL | Status: AC
  Administered 2021-07-03: 10 g via ORAL
  Filled 2021-07-03: qty 30

## 2021-07-03 NOTE — Progress Notes (Signed)
Physical Therapy Treatment ?Patient Details ?Name: Ann Russell ?MRN: [id number] ?DOB: 09/02/1940 ?Today's Date: 07/03/2021 ? ? ?History of Present Illness Ann Russell is a 75yoF admitted to [facility] on 06/25/21 under observation for mechanical fall that resulted in L distal fibula and 3rd met nondisplaced fx, managed conservatively, permitted to weightbear in [place]. Pt developed rapid ABLA which required transfer to SDU and PRBC. Pt then taken for Left IVC placement. Pt seen twice in the past week in the ED for syncope without fall, but denies dizziness/lightheadedness with this fall. Significant PMH includes: HTN, CKD 3B, PE on Eliquis, neuropathy. Pt had some AMS earlier in week, but has demonstrated improvement with cognition since. ? ?  ?PT Comments  ? ? Patient is agreeable to PT and eager to mobilize. She reports that she is ready to go home as soon as possible. The patient reports no pain with activity. Blood pressure monitored throughout session. 109/73 supine, 112/68 sitting 0 minutes, 101/64 sitting 2 minutes, 92/54 sitting 5 minutes, 102/59 after transfer to chair. She reports mild dizziness with upright activity that improved. She was able to perform incremental lateral scooting transfer from bed to recliner chair with one person assistance. Standing deferred due to soft blood pressures. She is making progress with functional independence, however she will likely continue to require assistance of a person for transfers and OOB activity. She could benefit from SNF but really just wants to go home. Recommend HHPT at a minimum. PT will continue to follow in attempts to maximize independence and decrease caregiver burden.  ? ?   ?Recommendations for follow up therapy are one component of a multi-disciplinary discharge planning process, led by the attending physician.  Recommendations may be updated based on patient status, additional functional criteria and insurance authorization. ? ?Follow Up  Recommendations ? Skilled nursing-short term rehab (<3 hours/day) ?  ?  ?Assistance Recommended at Discharge Intermittent Supervision/Assistance  ?Patient can return home with the following A lot of help with walking and/or transfers;A lot of help with bathing/dressing/bathroom;Assist for transportation;Help with stairs or ramp for entrance ?  ?Equipment Recommendations ? None recommended by PT  ?  ?Recommendations for Other Services   ? ? ?  ?Precautions / Restrictions Precautions ?Precautions: Fall ?Precaution Comments: orthostatic syncope. monitor BP ?Required Braces or Orthoses: Other Brace ?Other Brace: Left CAM rocker ?Restrictions ?Weight Bearing Restrictions: Yes ?LLE Weight Bearing: Partial weight bearing ?LLE Partial Weight Bearing Percentage or Pounds: 25-50%  ?  ? ?Mobility ? Bed Mobility ?Overal bed mobility: Needs Assistance ?Bed Mobility: Supine to Sit ?  ?  ?Supine to sit: Min guard ?  ?  ?General bed mobility comments: increased time required to complete tasks. cues for technique. CAM boot placed LLE prior to mobilizing with maximal assistance from therapist ?  ? ?Transfers ?Overall transfer level: Needs assistance ?Equipment used: None ?Transfers: Bed to chair/wheelchair/BSC ?  ?  ?  ?  ?  ? Lateral/Scoot Transfers: Mod assist ?General transfer comment: performed incremental scooting transfer from bed to recliner chair with drop arm in down position. cues for techniques. patient reports no dizziness with transfers, but feeling woozy with activity. deferred sit to stand transfers due to soft blood pressure in sitting which was monitored throughout session ?  ? ?Ambulation/Gait ?  ?  ?  ?  ?  ?  ?  ?  ? ? ?Stairs ?  ?  ?  ?  ?  ? ? ?Wheelchair Mobility ?  ? ?Modified Rankin (Stroke Patients  Only) ?  ? ? ?  ?Balance Overall balance assessment: Needs assistance ?Sitting-balance support: Feet supported ?Sitting balance-Leahy Scale: Good ?  ?  ?  ?  ?  ?  ?  ?  ?  ?  ?  ?  ?  ?  ?  ?  ?  ? ?  ?Cognition  Arousal/Alertness: Awake/alert ?Behavior During Therapy: [facility] for tasks assessed/performed ?Overall Cognitive Status: Within Functional Limits for tasks assessed ?  ?  ?  ?  ?  ?  ?  ?  ?  ?  ?  ?  ?  ?  ?  ?  ?General Comments: patient able to follow all commands with increased time ?  ?  ? ?  ?Exercises   ? ?  ?General Comments General comments (skin integrity, edema, etc.): blood pressures monitored throughout session. 109/73 in supine, 112/68 in sitting, 101/64 after sitting several minutes, 92/54 after sitting 5 minutes, 102/59 after transfer to chair. mild dizziness reported with sitting upright ?  ?  ? ?Pertinent Vitals/Pain Pain Assessment ?Pain Assessment: No/denies pain  ? ? ?Home Living   ?  ?  ?  ?  ?  ?  ?  ?  ?  ?   ?  ?Prior Function    ?  ?  ?   ? ?PT Goals (current goals can now be found in the care plan section) Acute Rehab PT Goals ?Patient Stated Goal: to return home ?PT Goal Formulation: With patient ?Time For Goal Achievement: 07/10/21 ?Potential to Achieve Goals: Good ?Progress towards PT goals: Progressing toward goals ? ?  ?Frequency ? ? ? 7X/week ? ? ? ?  ?PT Plan Current plan remains appropriate  ? ? ?Co-evaluation   ?  ?  ?  ?  ? ?  ?AM-PAC PT "6 Clicks" Mobility   ?Outcome Measure ? Help needed turning from your back to your side while in a flat bed without using bedrails?: A Little ?Help needed moving from lying on your back to sitting on the side of a flat bed without using bedrails?: A Little ?Help needed moving to and from a bed to a chair (including a wheelchair)?: A Lot ?Help needed standing up from a chair using your arms (e.g., wheelchair or bedside chair)?: A Lot ?Help needed to walk in hospital room?: Total ?Help needed climbing 3-5 steps with a railing? : Total ?6 Click Score: 12 ? ?  ?End of Session Equipment Utilized During Treatment: Gait belt ?Activity Tolerance: Patient tolerated treatment well ?Patient left: in chair;with call bell/phone within reach;with chair alarm  set ?Nurse Communication: Mobility status ?PT Visit Diagnosis: Unsteadiness on feet (R26.81);Muscle weakness (generalized) (M62.81);History of falling (Z91.81);Difficulty in walking, not elsewhere classified (R26.2) ?  ? ? ?Time: 1127-1212 ?PT Time Calculation (min) (ACUTE ONLY): 45 min ? ?Charges:  $Therapeutic Activity: 38-52 mins          ?          ? ?Minna Merritts, PT, MPT ? ? ? ?Percell Locus ?07/03/2021, 2:07 PM ? ?

## 2021-07-03 NOTE — Assessment & Plan Note (Addendum)
Patient states no bowel movement in over a week.  No improvement on MiraLAX.  Responded with a little lactulose ?

## 2021-07-03 NOTE — Plan of Care (Signed)

## 2021-07-03 NOTE — Plan of Care (Signed)
?  Problem: Education: ?Goal: Knowledge of General Education information will improve ?Description: Including pain rating scale, medication(s)/side effects and non-pharmacologic comfort measures ?Outcome: Progressing ?  ?Problem: Health Behavior/Discharge Planning: ?Goal: Ability to manage health-related needs will improve ?Outcome: Progressing ?  ?Problem: Clinical Measurements: ?Goal: Ability to maintain clinical measurements within normal limits will improve ?Outcome: Progressing ?Goal: Will remain free from infection ?Outcome: Progressing ?Goal: Cardiovascular complication will be avoided ?Outcome: Progressing ?  ?Problem: Activity: ?Goal: Risk for activity intolerance will decrease ?Outcome: Progressing ?  ?Problem: Nutrition: ?Goal: Adequate nutrition will be maintained ?Outcome: Progressing ?  ?Problem: Coping: ?Goal: Level of anxiety will decrease ?Outcome: Progressing ?  ?Problem: Elimination: ?Goal: Will not experience complications related to bowel motility ?Outcome: Not Progressing ?Goal: Will not experience complications related to urinary retention ?Outcome: Progressing ?  ?Problem: Pain Managment: ?Goal: General experience of comfort will improve ?Outcome: Progressing ?  ?Problem: Safety: ?Goal: Ability to remain free from injury will improve ?Outcome: Progressing ?  ?Problem: Skin Integrity: ?Goal: Risk for impaired skin integrity will decrease ?Outcome: Progressing ?  ?

## 2021-07-03 NOTE — Progress Notes (Signed)
[facility] Progress Note ? ?Patient: Ann Russell    QMG:[id number]  DOA: 06/25/2021    ?Date of Service: the patient was seen and examined on 07/03/2021 ? 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  Patient also found to have an incidental urinary tract infection.  Posterior splint applied and patient seen by podiatry who recommended boot and partial weightbearing with no need for surgery at this time.  Patient started on IV antibiotics for urinary infection.  She had been noted to have episodes of significant orthostatic hypotension and frequent falls at home.   ? ?On 3/7, patient noted to suddenly have symptomatic significant anemia with a drop to 4 and once confirmed, patient underwent transfusion.  CT of abdomen pelvis unremarkable and GI consulted who took patient for EGD on 3/8.  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  Suspected that this was having chronic bleeding while on Eliquis with addition of acute bleed.  Eliquis now on hold.  Patient is near nonambulatory status even prior to this fall and so now that she is off of blood thinner, concerned about high risk for DVT.  Interventional radiology consulted and patient had IVC filter placed on 3/10.  On morning of 3/11, patient mildly hypotensive and hemoglobin which was 7.2-day prior, now at 6.7.  Patient received 1 unit packed red blood cells and posttransfusion hemoglobin 8.9, with no change the following day.  Blood pressure posttransfusion stable. ? ?Assessment and  Plan: ?Assessment and Plan: ?* Bleeding acute gastric ulcer ?7 drop in hemoglobin felt to be secondary to bleeding ulcer from [place] anastomosis both acute and chronic.  Patient has been on Eliquis.  Appreciate gastroenterology help.  Ulcer cauterized and treated with epinephrine.  Completed IV PPI drip on afternoon of 3/10.  Discussed with GI and likely would not recommend restarting Eliquis at least for the another month.   ? ?Following EGD, hemoglobin slowly trending back down and patient status post 1 unit packed red blood cells on 3/11.  Hemoglobin remained stable ? ?Closed fibular fracture ?On the left. Nondisplaced. Closed. Also with nondisplaced proximal 3rd metatarsal fracture.  ?- Discussed with Dr. Vickki Muff, podiatry, who confirms nonoperative management plan. Recommends trial of partial weight bearing (25-50%) LLE with CAM boot which has been applied. No need for splint. ?Physical therapy to see above they will likely recommended short-term skilled nursing.  Patient lives at home with her boyfriend.  She tells me however that she does not want to go to skilled nursing and she also tells me that even prior to this fall she is only minimally ambulated due to bad peripheral neuropathy.  She would like to go home with home health physical therapy and I suspect that she will become almost fully nonambulatory which she understands and he is okay with. ? ?Frequent falls ?Likely multifactorial related to hypotension, neuropathy, physical deconditioning, UTI.  We will continue treating orthostatic hypotension.  Refuses skilled nursing, see below.  PT and OT have seen patient.  Still having some episodes of dizziness which will need to get better. ? ?  Hypotension ?BP with systolic in the 17E on arrival. Also several recent ED trips for syncope PTA. Persistent orthostasis here despite adequate IV fluid resuscitation. Echo performed today shows preserved LVEF 55-60%, G1DD associated with mild LVH, small circumferential  pericardial effusion without mention of diastolic collapse/tamponade.  ?- Started TED hose, midodrine.  Bleeding may have played a partial role in this. ? ?UTI (urinary tract infection) ?Urine culture without growth. Completed 3 doses of ceftriaxone ? ?Constipation ?Patient states no bowel movement in over a week.  No improvement on MiraLAX.  We will try Dulcolax. ? ?Neuropathy ?Uses assistive devices, wheelchair, scooter, walker.  Given her now almost nonambulatory status and the fact that she is no longer on blood thinning medication (at least for the immediate future if not permanently), patient is at high risk for a blood clot.  Status post IVC filter placed 3/10. ? ?Chronic kidney disease, stage 3a ([place]) ?Presented with AKI with creatinine 1.75, now back down to baseline.  Creatinine today at 1.09 with GFR of 51. ? ?Demand ischemia ([place]) ?Mild troponin elevation is down-trending without anginal symptoms or ischemic ECG changes. No definite RWMA on suboptimal echo.  ?- No further intervention currently planned. ? ?Hypertension ?Currently having severe orthostatic hypotension. Not on anti-HTN medications PTA. ? ?Neurological deficit present ?Reports impaired sensation in fingers bilaterally. No neck pain or other evidence of radiculopathy. CT head ordered and reviewed which shows no bleed or other acute changes. If symptoms persist despite normalization of blood pressure and clinical stability, would pursue further work up.  ? ?History of pulmonary embolism ?- We have to hold eliquis for now. Note normal RV size/function on echo.  Status post IVC filter ? ? ? ? ? ? ?Body mass index is 25.01 kg/m?.  ?  ?   ? ?Consultants: ?Podiatry ?Gastroenterology ?Interventional radiology ? ?Procedures: ?Status post EGD done 3/8 ?IVC filter placement 3/10 ? ?Antimicrobials: ?IV Rocephin x3 doses ? ?Code Status: Full code ? ? ?Subjective: Patient complains of some constipation ? ?Objective: ?Vital signs stable ?Vitals:  ?  07/03/21 0428 07/03/21 0733  ?BP: 137/69 115/62  ?Pulse: (!) 59 66  ?Resp: 18 16  ?Temp: 98.3 ?F (36.8 ?C) 98 ?F (36.7 ?C)  ?SpO2: 100% 99%  ? ? ?Intake/Output Summary (Last 24 hours) at 07/03/2021 1308 ?Last data filed at 07/03/2021 1018 ?Gross per 24 hour  ?Intake 726.33 ml  ?Output 1200 ml  ?Net -473.67 ml  ? ? ?Filed Weights  ? 06/25/21 2224 06/26/21 2055  ?Weight: 68 kg 74.6 kg  ? ?Body mass index is 25.01 kg/m?. ? ?Exam: ? ?General: Alert and oriented x3, fatigued ?HEENT: Normocephalic and atraumatic, mucous membranes are slightly dry ?Cardiovascular: Regular rate and rhythm, S1-S2 ?Respiratory: Clear to auscultation bilaterally ?Abdomen: Soft, nontender, nondistended, positive bowel sounds ?Musculoskeletal: 1+ pitting edema from the knees down bilaterally ?Skin: No skin breaks, tears or lesions ?Psychiatry: Appropriate, no evidence of psychoses ?Neurology: significantly decreased sensation in lower extremities due to neuropathy ? ?Data Reviewed: ?Hemoglobin staying stable at 8.9 from previous day posttransfusion ? ?Disposition:  ?Status is: Inpatient ?Remains inpatient appropriate because: Ensuring no further bleeding and tolerate working with PT. anticipate discharge 3/13 ?  ? ?Family Communication: Left message for daughter ?DVT Prophylaxis: ?Place TED hose Start: 06/27/21 1239 ? ? ? ?Author: ?Annita Brod ,MD ?07/03/2021 1:08 PM ? ?To reach On-call, see care teams to locate the attending and reach out via [email or web address]. ?Between 7PM-7AM, please contact night-coverage ?If you still have difficulty reaching the  attending provider, please page the [facility] (Director on Call) for [facility] on amion for assistance. ? ?

## 2021-07-03 NOTE — TOC Progression Note (Signed)
Transition of Care (TOC) - Progression Note  ? ? ?Patient Details  ?Name: Ann Russell ?MRN: [id number] ?Date of Birth: 09/18/1940 ? ?Transition of Care (TOC) CM/SW Contact  ?Equan Cogbill, LCSW ?Phone Number: ?07/03/2021, 5:01 PM ? ?Clinical Narrative:    ? ?Plan for discharge 07/04/2021. Patient has refused SNF placement regardless of recommendation. Patient is set up with [facility] for PT/OT home health and has all necessary DME. Main contact daughter Seward Meth [phone] and Elie Goody [phone]. TOC will continue to follow.  ? ?Expected Discharge Plan: [place] ?Barriers to Discharge: Continued Medical Work up ? ?Expected Discharge Plan and Services ?Expected Discharge Plan: [place] ?  ?  ?Post Acute Care Choice: Home Health ?Living arrangements for the past 2 months: [place] ?Expected Discharge Date: 06/26/21               ?  ?  ?  ?  ?  ?HH Arranged: PT, OT ?[place] Agency: [facility] ?Date HH Agency Contacted: 06/27/21 ?  ?Representative spoke with at [place]: Juanda Crumble ? ? ?Social Determinants of Health (SDOH) Interventions ?  ? ?Readmission Risk Interventions ?No flowsheet data found. ? ?

## 2021-07-04 ENCOUNTER — Telehealth: Payer: Self-pay

## 2021-07-04 ENCOUNTER — Telehealth: Payer: Self-pay | Admitting: Internal Medicine

## 2021-07-04 LAB — CBC
HCT: 26.8 % — ABNORMAL LOW (ref 36.0–46.0)
Hemoglobin: 8.7 g/dL — ABNORMAL LOW (ref 12.0–15.0)
MCH: 30.5 pg (ref 26.0–34.0)
MCHC: 32.5 g/dL (ref 30.0–36.0)
MCV: 94 fL (ref 80.0–100.0)
Platelets: 217 10*3/uL (ref 150–400)
RBC: 2.85 MIL/uL — ABNORMAL LOW (ref 3.87–5.11)
RDW: 19.9 % — ABNORMAL HIGH (ref 11.5–15.5)
WBC: 6 10*3/uL (ref 4.0–10.5)
nRBC: 0.7 % — ABNORMAL HIGH (ref 0.0–0.2)

## 2021-07-04 LAB — BASIC METABOLIC PANEL
Anion gap: 5 (ref 5–15)
BUN: 20 mg/dL (ref 8–23)
CO2: 24 mmol/L (ref 22–32)
Calcium: 8.7 mg/dL — ABNORMAL LOW (ref 8.9–10.3)
Chloride: 110 mmol/L (ref 98–111)
Creatinine, Ser: 1.14 mg/dL — ABNORMAL HIGH (ref 0.44–1.00)
GFR, Estimated: 49 mL/min — ABNORMAL LOW (ref >60)
Glucose, Bld: 89 mg/dL (ref 70–99)
Potassium: 3.8 mmol/L (ref 3.5–5.1)
Sodium: 139 mmol/L (ref 135–145)

## 2021-07-04 LAB — COPPER, SERUM: Copper: 103 ug/dL (ref 80–158)

## 2021-07-04 MED ORDER — MIDODRINE HCL 5 MG PO TABS: 5.0000 mg | ORAL_TABLET | Freq: Three times a day (TID) | ORAL | 1 refills | Status: DC

## 2021-07-04 MED ORDER — VITAMIN B-12 1000 MCG PO TABS
1000.0000 ug | ORAL_TABLET | Freq: Every day | ORAL | Status: DC
  Administered 2021-07-04: 1000 ug via ORAL
  Filled 2021-07-04: qty 1

## 2021-07-04 MED ORDER — OMEPRAZOLE 20 MG PO CPDR: 20.0000 mg | DELAYED_RELEASE_CAPSULE | Freq: Two times a day (BID) | ORAL | 2 refills | Status: DC

## 2021-07-04 NOTE — Telephone Encounter (Signed)
Home Health Verbal Orders - Caller/Agency: Ren[facility] Don  ?Callback Number: [phone] ?Requesting OT/PT/Skilled Nursing/Social Work/Speech Therapy: PT and OT  ?Frequency:  ? ?No frequency yet, inquiring if PCP will be attending physician  ? ?

## 2021-07-04 NOTE — Progress Notes (Signed)
Physical Therapy Treatment ?Patient Details ?Name: Ann Russell ?MRN: [id number] ?DOB: 02/03/1941 ?Today's Date: 07/04/2021 ? ? ?History of Present Illness Ann Russell is a 36yoF admitted to [facility] on 06/25/21 under observation for mechanical fall that resulted in L distal fibula and 3rd met nondisplaced fx, managed conservatively, permitted to weightbear in [place]. Pt developed rapid ABLA which required transfer to SDU and PRBC. Pt then taken for Left IVC placement. Pt seen twice in the past week in the ED for syncope without fall, but denies dizziness/lightheadedness with this fall. Significant PMH includes: HTN, CKD 3B, PE on Eliquis, neuropathy. Pt had some AMS earlier in week, but has demonstrated improvement with cognition since. ? ?  ?PT Comments  ? ? Pt awake and in be don entry, agreeable to session. Continued focus on independence, safety, fluency with stand pivot transfers in the context of ongoing orthostatic hypotension and left CAM rocker/PWB. Pt moving somewhat better, less physical assistance overall, but still needs help with CAM rocker and explanation of weightbearing instructions.use of walk. Pt dizzy while up, similar ot prior visits, but is able to make it to chair without presyncope episode. Student RN asking about TED hose use and pt's report of discomfort- assisted with measurement and fitting and implication for use on RLE. Did not use TED hose on Left.  ?  ?Recommendations for follow up therapy are one component of a multi-disciplinary discharge planning process, led by the attending physician.  Recommendations may be updated based on patient status, additional functional criteria and insurance authorization. ? ?Follow Up Recommendations ? Skilled nursing-short term rehab (<3 hours/day) ?  ?  ?Assistance Recommended at Discharge Intermittent Supervision/Assistance  ?Patient can return home with the following A lot of help with walking and/or transfers;A lot of help with  bathing/dressing/bathroom;Assist for transportation;Help with stairs or ramp for entrance ?  ?Equipment Recommendations ? Wheelchair (measurements PT);Wheelchair cushion (measurements PT) (may need a WC if unable to access home with electric scooter)  ?  ?Recommendations for Other Services OT consult ? ? ?  ?Precautions / Restrictions Precautions ?Precautions: Fall ?Precaution Comments: orthostatic syncope. monitor BP ?Other Brace: Left CAM rocker ?Restrictions ?Weight Bearing Restrictions: Yes ?LLE Weight Bearing: Partial weight bearing  ?  ? ?Mobility ? Bed Mobility ?  ?Bed Mobility: Supine to Sit ?Rolling: Supervision ?  ?Supine to sit: Supervision ?  ?  ?General bed mobility comments: assist for CAM boot donning; pt able to don her Rt sock modI ?  ? ?Transfers ?  ?Equipment used: Rolling walker (2 wheels) ?Transfers: Bed to chair/wheelchair/BSC, Sit to/from Stand ?Sit to Stand: From elevated surface, Min assist ?Stand pivot transfers: Min guard, From elevated surface ?  ?  ?  ?  ?General transfer comment: dizzy upon sitting, dizzy while up, progressed once sitting in chair ?  ? ?Ambulation/Gait ?  ?  ?  ?  ?  ?  ?  ?  ? ? ?Stairs ?  ?  ?  ?  ?  ? ? ?Wheelchair Mobility ?  ? ?Modified Rankin (Stroke Patients Only) ?  ? ? ?  ?Balance   ?  ?  ?  ?  ?  ?  ?  ?  ?  ?  ?  ?  ?  ?  ?  ?  ?  ?  ?  ? ?  ?Cognition Arousal/Alertness: Awake/alert ?Behavior During Therapy: [facility] for tasks assessed/performed ?Overall Cognitive Status: Within Functional Limits for tasks assessed ?  ?  ?  ?  ?  ?  ?  ?  ?  ?  ?  ?  ?  ?  ?  ?  ?  ?  ?  ? ?  ?  Exercises   ? ?  ?General Comments   ?  ?  ? ?Pertinent Vitals/Pain Pain Assessment ?Pain Assessment: Faces ?Faces Pain Scale: Hurts little more ?Pain Location: Lt ankle during transfers ?Pain Intervention(s): Limited activity within patient's tolerance, Monitored during session  ? ? ?Home Living   ?  ?  ?  ?  ?  ?  ?  ?  ?  ?   ?  ?Prior Function    ?  ?  ?   ? ?PT Goals (current goals can  now be found in the care plan section) Acute Rehab PT Goals ?Patient Stated Goal: to return home ?PT Goal Formulation: With patient ?Time For Goal Achievement: 07/10/21 ?Potential to Achieve Goals: Good ?Additional Goals ?Additional Goal #1: Pt will be supervision to navigate manual WC 58ft, to improve overall safety with functional mobility and decrease caregiver burden at DC. ?Progress towards PT goals: Progressing toward goals ? ?  ?Frequency ? ? ? 7X/week ? ? ? ?  ?PT Plan Current plan remains appropriate  ? ? ?Co-evaluation   ?  ?  ?  ?  ? ?  ?AM-PAC PT "6 Clicks" Mobility   ?Outcome Measure ? Help needed turning from your back to your side while in a flat bed without using bedrails?: A Little ?Help needed moving from lying on your back to sitting on the side of a flat bed without using bedrails?: A Little ?Help needed moving to and from a bed to a chair (including a wheelchair)?: A Lot ?Help needed standing up from a chair using your arms (e.g., wheelchair or bedside chair)?: A Lot ?Help needed to walk in hospital room?: Total ?Help needed climbing 3-5 steps with a railing? : Total ?6 Click Score: 12 ? ?  ?End of Session Equipment Utilized During Treatment: Gait belt ?Activity Tolerance: Patient tolerated treatment well;No increased pain ?Patient left: in chair;with call bell/phone within reach;with nursing/sitter in room ?Nurse Communication: Mobility status ?PT Visit Diagnosis: Unsteadiness on feet (R26.81);Muscle weakness (generalized) (M62.81);History of falling (Z91.81);Difficulty in walking, not elsewhere classified (R26.2) ?  ? ? ?Time: 7897-8478 ?PT Time Calculation (min) (ACUTE ONLY): 27 min ? ?Charges:  $Therapeutic Exercise: 8-22 mins ?$Therapeutic Activity: 8-22 mins          ?          ?11:35 AM, 07/04/21 ?Etta Grandchild, PT, DPT ?Physical Therapist - [place] ?[facility]  ?[phone] (ASCOM)  ? ? ? ?Gerarda Conklin C ?07/04/2021, 11:35 AM ? ?

## 2021-07-04 NOTE — Telephone Encounter (Signed)
Transition Care Management Follow-up Telephone Call ?Date of discharge and from where: [facility] 07/04/21 ?How have you been since you were released from the hospital? [facility], just today ?Any questions or concerns? No ? ?Items Reviewed: ?Did the pt receive and understand the discharge instructions provided? Yes  ?Medications obtained and verified? Yes  ?Other? No  ?Any new allergies since your discharge? No  ?Dietary orders reviewed? No ?Do you have support at home? Yes  ? ?Home Care and Equipment/Supplies: ?Were home health services ordered? no ?If so, what is the name of the agency? [facility]  ?Has the agency set up a time to come to the patient's home? no ? ? ?Functional Questionnaire: (I = Independent and D = Dependent) ?ADLs: I  ? ?Bathing/Dressing-  ? ?Meal Prep- I ? ?Eating- I ? ?Maintaining continence- I ? ?Transferring/Ambulation- I ? ?Managing Meds- I ? ?Follow up appointments reviewed: ? ?PCP Hospital f/u appt confirmed? Yes  Scheduled to see Ann Russell on 3/17 @ 3pm. ?Are transportation arrangements needed? No  ?If their condition worsens, is the pt aware to call PCP or go to the Emergency Dept.? Yes ?Was the patient provided with contact information for the PCP's office or ED? Yes ?Was to pt encouraged to call back with questions or concerns? Yes / ?

## 2021-07-04 NOTE — TOC Progression Note (Signed)
Transition of Care (TOC) - Progression Note  ? ? ?Patient Details  ?Name: TZIVIA ONEIL ?MRN: [id number] ?Date of Birth: [birth year] ? ?Transition of Care (TOC) CM/SW Contact  ?Conception Oms, RN ?Phone Number: ?07/04/2021, 1:49 PM ? ?Clinical Narrative:    ?Patient has refused SNF placement regardless of recommendation. Patient is set up with [facility] for PT/OT home health and has all necessary DME ? ? ?Expected Discharge Plan: [place] ?Barriers to Discharge: Continued Medical Work up ?Expected Discharge Plan and Services ?Expected Discharge Plan: [place] ?  ?  ?Post Acute Care Choice: Home Health ?Living arrangements for the past 2 months: [place] ?Expected Discharge Date: 07/04/21               ?  ?  ?  ?  ?  ?HH Arranged: PT, OT ?[place] Agency: [facility] ?Date HH Agency Contacted: 06/27/21 ?  ?Representative spoke with at [place]: Juanda Crumble ? ? ?Social Determinants of Health (SDOH) Interventions ?  ? ?Readmission Risk Interventions ?No flowsheet data found. ? ?

## 2021-07-04 NOTE — Care Management Important Message (Signed)
Important Message ? ?Patient Details  ?Name: Ann Russell ?MRN: [id number] ?Date of Birth: [birth year] ? ? ?Medicare Important Message Given:  Yes ? ? ? ? ?[name] A Dashanae Longfield ?07/04/2021, 11:32 AM ?

## 2021-07-04 NOTE — Plan of Care (Signed)
?  Problem: Nutrition: ?Goal: Adequate nutrition will be maintained ?07/04/2021 1322 by Trula Slade, RN ?Outcome: Adequate for Discharge ?07/04/2021 1322 by Trula Slade, RN ?Outcome: Progressing ?  ?Problem: Education: ?Goal: Knowledge of General Education information will improve ?Description: Including pain rating scale, medication(s)/side effects and non-pharmacologic comfort measures ?07/04/2021 1322 by Trula Slade, RN ?Outcome: Adequate for Discharge ?07/04/2021 1322 by Trula Slade, RN ?Outcome: Progressing ?  ?Problem: Health Behavior/Discharge Planning: ?Goal: Ability to manage health-related needs will improve ?07/04/2021 1322 by Trula Slade, RN ?Outcome: Adequate for Discharge ?07/04/2021 1322 by Trula Slade, RN ?Outcome: Progressing ?  ?Problem: Nutrition: ?Goal: Adequate nutrition will be maintained ?07/04/2021 1322 by Trula Slade, RN ?Outcome: Adequate for Discharge ?07/04/2021 1322 by Trula Slade, RN ?Outcome: Progressing ?  ?

## 2021-07-04 NOTE — Telephone Encounter (Signed)
Called patient but was not able to speak with her since she is currently at the hospital. Mr. [name]-patient's significant other answered and told me that she was in the hospital but hopefully would get discharged today. I told him that I would be calling her again later on this week so I could schedule her an EGD per Dr. Marius Ditch in 8 weeks. ?

## 2021-07-04 NOTE — Plan of Care (Signed)

## 2021-07-04 NOTE — Discharge Summary (Addendum)
Physician Discharge Summary   Patient: Ann Russell MRN: [id number] DOB: 01/22/1941  Admit date:     06/25/2021  Discharge date: 07/04/21  Discharge Physician: Annita Brod   PCP: Jearld Fenton, NP   Recommendations at discharge:   Medication change: Eliquis discontinued Medication change: Prilosec changed from 20 mg once a day to twice a day for the next 3 months at least Recommended that as outpatient, patient started on IM B12 injections and occasional IV iron IV infusions. Patient will follow with her PCP in the next month Patient being discharged with home health PT Patient will follow-up with podiatry as outpatient in the next few months Patient will follow with GI in the next month New medication: Midodrine 5 mg p.o. 3 times daily  Discharge Diagnoses: Principal Problem:   Bleeding acute gastric ulcer Active Problems:   Closed fibular fracture   Frequent falls   UTI (urinary tract infection)   Hypotension   Constipation   Neuropathy   Chronic kidney disease, stage 3a ([place])   Demand ischemia ([place])   Hypertension   History of pulmonary embolism   Neurological deficit present  Resolved Problems:   * No resolved hospital problems. *  Hospital Course: 81 year old female with past medical history of PE status post thrombectomy on Eliquis plus stage IIIb chronic kidney disease, hypertension and advanced peripheral neuropathy who presented to the emergency room on 3/4 after having mechanical fall and presented to the emergency room with left ankle pain and unable to bear weight and found to have a nondisplaced closed fracture of the distal left fibula and proximal left third metatarsal.  Patient also found to have an incidental urinary tract infection.  Posterior splint applied and patient seen by podiatry who recommended boot and partial weightbearing with no need for surgery at this time.  Patient started on IV antibiotics for urinary infection.  She had been noted to  have episodes of significant orthostatic hypotension and frequent falls at home.    On 3/7, patient noted to suddenly have symptomatic significant anemia with a drop to 4 and once confirmed, patient underwent transfusion.  CT of abdomen pelvis unremarkable and GI consulted who took patient for EGD on 3/8.  EGD noted ulcer at [place] anastomosis from previous gastric bypass which was treated with epinephrine and heat.  Suspected that this was having chronic bleeding while on Eliquis with addition of acute bleed.  Eliquis now on hold.  Patient is near nonambulatory status even prior to this fall and so now that she is off of blood thinner, concerned about high risk for DVT.  Interventional radiology consulted and patient had IVC filter placed on 3/10.  On morning of 3/11, patient mildly hypotensive and hemoglobin which was 7.2-day prior, and then at 6.7.  Posttransfusion labs same day, hemoglobin 8.9 and have not changed in over 36 hours.  Assessment and Plan: * Bleeding acute gastric ulcer 7 drop in hemoglobin felt to be secondary to bleeding ulcer from [place] anastomosis both acute and chronic.  Patient has been on Eliquis.  Appreciate gastroenterology help.  Ulcer cauterized and treated with epinephrine.  Completed IV PPI drip on afternoon of 3/10.  Discussed with GI and likely would not recommend restarting Eliquis at least for the another month.    Following EGD, hemoglobin slowly trending back down and patient status post 1 unit packed red blood cells on 3/11.  Hemoglobin has since remained stable.  GI recommends that patient stay off of Eliquis at  least for a month, but if she does need to restart this medication, can have follow-up reassessment  Closed fibular fracture On the left. Nondisplaced. Closed. Also with nondisplaced proximal 3rd metatarsal fracture.  - Discussed with Dr. Vickki Muff, podiatry, who confirms nonoperative management plan. Recommends trial of partial weight bearing (25-50%) LLE with CAM  boot which has been applied. No need for splint. Physical therapy to see above they will likely recommended short-term skilled nursing.  Patient lives at home with her boyfriend.  She tells me however that she does not want to go to skilled nursing and she also tells me that even prior to this fall she is only minimally ambulated due to bad peripheral neuropathy.  She would like to go home with home health physical therapy and I suspect that she will become almost fully nonambulatory which she understands and he is okay with.  Frequent falls Likely multifactorial related to hypotension, neuropathy, physical deconditioning, UTI.  We will continue treating orthostatic hypotension.  Refuses skilled nursing, see below.  PT and OT have seen patient.  Still having some episodes of dizziness which will need to get better.  Hypotension BP with systolic in the 84O on arrival. Also several recent ED trips for syncope PTA. Persistent orthostasis here despite adequate IV fluid resuscitation. Echo performed today shows preserved LVEF 55-60%, G1DD associated with mild LVH, small circumferential pericardial effusion without mention of diastolic collapse/tamponade.  - Started TED hose, midodrine.  Bleeding may have played a partial role in this.  Low-dose midodrine added.  UTI (urinary tract infection) Urine culture without growth. Completed 3 doses of ceftriaxone  Constipation Patient states no bowel movement in over a week.  No improvement on MiraLAX.  Responded with a little lactulose  Neuropathy Uses assistive devices, wheelchair, scooter, walker.  Given her now almost nonambulatory status and the fact that she is no longer on blood thinning medication (at least for the immediate future if not permanently), patient is at high risk for a blood clot.  Status post IVC filter placed 3/10.  Chronic kidney disease, stage 3a ([place]) Presented with AKI with creatinine 1.75, now back down to baseline.  Creatinine on  day of discharge at 1.14.  Demand ischemia ([facility]) Mild troponin elevation is down-trending without anginal symptoms or ischemic ECG changes. No definite RWMA on suboptimal echo.  - No further intervention currently planned.  Hypertension Currently having severe orthostatic hypotension. Not on anti-HTN medications PTA.  Neurological deficit present Reports impaired sensation in fingers bilaterally. No neck pain or other evidence of radiculopathy. CT head ordered and reviewed which shows no bleed or other acute changes. If symptoms persist despite normalization of blood pressure and clinical stability, would pursue further work up.   History of pulmonary embolism - We have to hold eliquis for now. Note normal RV size/function on echo.  Status post IVC filter         Consultants: Gastroenterology, podiatry, interventional radiology Procedures performed: Status post EGD done 3/8 IVC filter placement 3/10  Disposition: Home Diet recommendation:  Discharge Diet Orders (From admission, onward)     Start     Ordered   07/04/21 0000  Diet - low sodium heart healthy        07/04/21 1319           Cardiac diet DISCHARGE MEDICATION: Allergies as of 07/04/2021   No Known Allergies      Medication List     STOP taking these medications    apixaban 5  MG Tabs tablet Commonly known as: Eliquis       TAKE these medications    acetaminophen 650 MG CR tablet Commonly known as: TYLENOL Take 650 mg by mouth 2 (two) times a day.   B COMPLEX VITAMINS SL Place 1 tablet under the tongue daily.   busPIRone 15 MG tablet Commonly known as: BUSPAR Take 1 tablet (15 mg total) by mouth 2 (two) times daily.   calcitRIOL 0.25 MCG capsule Commonly known as: ROCALTROL Take 1 capsule (0.25 mcg total) by mouth daily.   CALCIUM 600 + D PO Take 1 tablet by mouth daily.   ferrous [id number]-HERDEYC C-folic acid capsule Commonly known as: TRINSICON / FOLTRIN Take 1 capsule by  mouth 2 (two) times daily.   FLUoxetine 20 MG capsule Commonly known as: PROZAC Take 3 capsules (60 mg total) by mouth every morning. What changed:  how much to take when to take this   midodrine 5 MG tablet Commonly known as: PROAMATINE Take 1 tablet (5 mg total) by mouth 3 (three) times daily with meals.   Multi-Vitamins Tabs Take 2 tablets by mouth 2 (two) times daily.   omeprazole 20 MG capsule Commonly known as: PRILOSEC Take 1 capsule (20 mg total) by mouth 2 (two) times daily before a meal. What changed: when to take this   pregabalin 100 MG capsule Commonly known as: LYRICA Take 1 capsule (100 mg total) by mouth 3 (three) times daily.   Prolia 60 MG/ML Sosy injection Generic drug: denosumab Inject 60 mg into the skin every 6 (six) months.   valACYclovir 1000 MG tablet Commonly known as: VALTREX Take 1 tablet (1,000 mg total) by mouth daily as needed (fever blisters).   VITAMIN B6 PO Take 1 tablet by mouth daily. With b12        Follow-up Information     Leim Fabry, MD. Schedule an appointment as soon as possible for a visit .   Specialty: Orthopedic Surgery Why: Office will call Patient Contact information: [address] [place] [address] [phone]                Discharge Exam: Danley Danker Weights   06/25/21 2224 06/26/21 2055  Weight: 68 kg 74.6 kg   General: Alert and oriented x3, no acute distress Cardiovascular: Regular rate and rhythm, S1-S2 Lungs: Clear to auscultation bilaterally  Condition at discharge: good  The results of significant diagnostics from this hospitalization (including imaging, microbiology, ancillary and laboratory) are listed below for reference.   Imaging Studies: CT ABDOMEN PELVIS WO CONTRAST  Result Date: 06/28/2021 CLINICAL DATA:  Retro peritoneal bleed suspected. EXAM: CT ABDOMEN AND PELVIS WITHOUT CONTRAST TECHNIQUE: Multidetector CT imaging of the abdomen and pelvis was performed following the  standard protocol without IV contrast. RADIATION DOSE REDUCTION: This exam was performed according to the departmental dose-optimization program which includes automated exposure control, adjustment of the mA and/or kV according to patient size and/or use of iterative reconstruction technique. COMPARISON:  06/23/2021 FINDINGS: Lower chest: Minimal scarring or atelectasis at the lung bases. Hepatobiliary: Liver parenchyma appears normal without contrast. Previous cholecystectomy. Pancreas: Normal Spleen: Normal Adrenals/Urinary Tract: Adrenal glands are normal. Renal cortical atrophy with multiple renal cysts. Nonobstructing 2 mm stones in the right kidney. No hydroureteronephrosis. The bladder is normal. Stomach/Bowel: Previous gastric surgery. Small bowel appears normal. No acute or significant: Finding. Diverticulosis without evidence of diverticulitis. Vascular/Lymphatic: Aortic atherosclerosis. No aneurysm. IVC is normal. No retroperitoneal adenopathy or retroperitoneal bleeding. Reproductive: Previous hysterectomy.  No  pelvic mass. Other: No free fluid or air. Periumbilical ventral hernia containing only fat. Musculoskeletal: Chronic lumbar degenerative changes. IMPRESSION: No evidence of retroperitoneal or intraperitoneal hemorrhage. Aortic Atherosclerosis (ICD10-I70.0). Previous cholecystectomy. Previous hysterectomy. Previous gastric surgery. Electronically Signed   By: Nelson Chimes M.D.   On: 06/28/2021 15:03   DG Tibia/Fibula Left  Result Date: 06/25/2021 CLINICAL DATA:  Recent fall with left leg pain, initial encounter EXAM: LEFT TIBIA AND FIBULA - 2 VIEW COMPARISON:  None. FINDINGS: Mild degenerative changes of the knee joint are seen. Lateral soft tissue swelling is again seen distally. Lucency is noted in the distal aspect of the fibula consistent with undisplaced fracture better visualized than on recent ankle films. No other focal abnormality is noted. IMPRESSION: Distal fibular fracture without  significant displacement better visualized than on recent ankle films. Electronically Signed   By: Inez Catalina M.D.   On: 06/25/2021 23:45   DG Ankle Complete Left  Result Date: 06/25/2021 CLINICAL DATA:  Recent fall with ankle pain, initial encounter EXAM: LEFT ANKLE COMPLETE - 3+ VIEW COMPARISON:  None. FINDINGS: Soft tissue swelling is noted laterally. The distal tibia and fibula show no acute fracture. Tarsal degenerative changes are seen. Postsurgical changes in the first metatarsal are noted. No acute fracture or dislocation is noted. IMPRESSION: Soft tissue changes without acute abnormality. Electronically Signed   By: Inez Catalina M.D.   On: 06/25/2021 22:50   CT HEAD WO CONTRAST (5MM)  Result Date: 06/28/2021 CLINICAL DATA:  Neuro deficit, acute, stroke suspected EXAM: CT HEAD WITHOUT CONTRAST TECHNIQUE: Contiguous axial images were obtained from the base of the skull through the vertex without intravenous contrast. RADIATION DOSE REDUCTION: This exam was performed according to the departmental dose-optimization program which includes automated exposure control, adjustment of the mA and/or kV according to patient size and/or use of iterative reconstruction technique. COMPARISON:  June 23, 2021. FINDINGS: Brain: No evidence of acute large vascular territory infarction, hemorrhage, hydrocephalus, extra-axial collection or mass lesion/mass effect. Cerebral atrophy. Mild patchy white matter hypoattenuation, nonspecific but paddle with chronic microvascular ischemic disease. Vascular: No hyperdense vessel identified. Calcific intracranial atherosclerosis. Skull: No acute fracture. Sinuses/Orbits: Opacification of scattered ethmoid air cells. Otherwise, visualized sinuses are largely clear. Postsurgical changes of the left globe, partially imaged. Other: No mastoid effusions. IMPRESSION: 1. No evidence of acute intracranial abnormality by CT. MRI could provide more sensitive evaluation for acute infarct if  clinically indicated. 2. Atrophy and chronic microvascular disease. Electronically Signed   By: Margaretha Sheffield M.D.   On: 06/28/2021 13:40   CT Head Wo Contrast  Result Date: 06/23/2021 CLINICAL DATA:  Altered mental status EXAM: CT HEAD WITHOUT CONTRAST TECHNIQUE: Contiguous axial images were obtained from the base of the skull through the vertex without intravenous contrast. RADIATION DOSE REDUCTION: This exam was performed according to the departmental dose-optimization program which includes automated exposure control, adjustment of the mA and/or kV according to patient size and/or use of iterative reconstruction technique. COMPARISON:  09/20/2019 FINDINGS: Brain: No acute intracranial findings are seen. Cortical sulci are prominent. Cerebellar tonsils are lower than usual in the position. There is decreased density in the subcortical and periventricular white matter. Vascular: Unremarkable. Skull: Unremarkable. Sinuses/Orbits: There is mucosal thickening in the ethmoid sinus. Other: No significant interval changes are noted. IMPRESSION: No acute intracranial findings are seen in noncontrast CT brain. Atrophy. Small-vessel disease. 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 This finding has not changed significantly. Chronic  ethmoid sinusitis. Electronically Signed   By: Elmer Picker M.D.   On: 06/23/2021 19:58   CT ABDOMEN W CONTRAST  Result Date: 06/29/2021 CLINICAL DATA:  Postop abdominal pain. Peritonitis suspected. endoscopy this morning. This found ulcers at the gastro/jejunal anastomosis. Evaluate perforation. EXAM: CT ABDOMEN WITH CONTRAST TECHNIQUE: Multidetector CT imaging of the abdomen was performed using the standard protocol following bolus administration of intravenous contrast. RADIATION DOSE REDUCTION: This exam was performed according to the departmental dose-optimization program which includes automated exposure control,  adjustment of the mA and/or kV according to patient size and/or use of iterative reconstruction technique. CONTRAST:  95m OMNIPAQUE IOHEXOL 300 MG/ML  SOLN COMPARISON:  Yesterday's abdominopelvic CT. FINDINGS: Lower chest: Left base scarring. Mild cardiomegaly, with trace bilateral pleural effusions. Lad coronary artery calcification. Hepatobiliary: Normal liver. Cholecystectomy. Upper normal intrahepatic ducts. Normal common duct caliber. Pancreas: Pancreatic atrophy. No duct dilatation or acute inflammation. Spleen: Normal in size, without focal abnormality. Adrenals/Urinary Tract: Bilateral adrenal thickening, with maintenance of adreniform shape. Moderate renal cortical scarring bilaterally. Areas of retained cortex in both kidneys create "pseudotumors". There also too small to characterize low-density bilateral renal lesions which are likely cysts. Punctate right renal collecting system calculi. No hydronephrosis. Stomach/Bowel: Status post Roux-en-Y gastric bypass. Wall thickening of the gastric remnant including on 25/2 and adjacent jejunum on 30/2 may correspond to site of ulcers on endoscopy. Contrast in the bypassed stomach may be due to reflux versus less likely gastric/gastric fistula. Large colonic stool burden.  Otherwise normal small bowel. Vascular/Lymphatic: Aortic atherosclerosis. No retroperitoneal or retrocrural adenopathy. Other: No abdominal ascites.  No free intraperitoneal air. Musculoskeletal: Lumbar spondylosis. S shaped lumbar spine curvature. IMPRESSION: 1. No evidence of free intraperitoneal air. 2. Status post Roux-en-Y gastric bypass with wall thickening of the gastric remnant and adjacent jejunum, likely representing gastritis/enteritis and possibly sites of ulcers on today's endoscopy. 3. Right nephrolithiasis 4. Trace bilateral pleural effusions. 5. Coronary artery atherosclerosis. Aortic Atherosclerosis (ICD10-I70.0). Electronically Signed   By: KAbigail MiyamotoM.D.   On: 06/29/2021  14:45   IR IVC FILTER PLMT / S&I /Burke KeelsGUID/MOD SED  Result Date: 07/01/2021 INDICATION: History of PE on Eliquis.  Acute GI bleed.  Unable to anticoagulate. EXAM: Procedures: 1. ULTRASOUND GUIDANCE FOR VASCULAR ACCESS 2. INFERIOR VENA CAVA VENOGRAM 3. INFERIOR VENA CAVA FILTER PLACEMENT COMPARISON:  CT AP, 06/29/2021. MEDICATIONS: None. ANESTHESIA/SEDATION: Fentanyl 50 mcg IV; Versed 1 mg IV Sedation Time: 32 minutes; The patient was continuously monitored during the procedure by the interventional radiology nurse under my direct supervision. CONTRAST:  431mOMNIPAQUE IOHEXOL 350 MG/ML SOLN FLUOROSCOPY TIME:  3 minutes 12 seconds (1256.3Gy) COMPLICATIONS: None immediate PROCEDURE: Informed consent was obtained from the patient and/or patient's representative following explanation of the procedure, risks, benefits and alternatives. The patient understands, agrees and consents for the procedure. All questions were addressed. A time out was performed prior to the initiation of the procedure. Maximal barrier sterile technique utilized including caps, mask, sterile gowns, sterile gloves, large sterile drape, hand hygiene, and Betadine prep. Under sterile condition and local anesthesia, right internal jugular venous access was performed with ultrasound. An ultrasound image was saved and sent to PACS. Over a guidewire, the IVC filter delivery sheath and inner dilator were advanced into the IVC just above the IVC bifurcation. Contrast injection was performed for an IVC venogram. Through the delivery sheath, a retrievable Argon Option ELITE IVC filter was deployed below the level of the renal veins and above the IVC bifurcation.  Limited post deployment venacavagram was performed. The delivery sheath was removed and hemostasis was obtained with manual compression. A dressing was placed. The patient tolerated the procedure well without immediate post procedural complication. FINDINGS: The IVC is patent. No evidence of  thrombus, stenosis, or occlusion. No variant venous anatomy. IMPRESSION: Successful placement of a retrievable, infrarenal IVC filter via right jugular approach. PLAN: IVC filters can cause complications when left in place for extended periods of time. If medically appropriate, recommend discontinuing filter prior to discharge. Please re-evaluate the patient for filter discontinuation when they are seen in follow up, and refer patient to Interventional Radiology for removal. Michaelle Birks, MD [facility] Electronically Signed   By: Michaelle Birks M.D.   On: 07/01/2021 21:12   DG Chest Portable 1 View  Result Date: 06/23/2021 CLINICAL DATA:  syncope, sob EXAM: PORTABLE CHEST 1 VIEW COMPARISON:  Chest x-ray 09/20/2019, CT chest 06/21/2017 FINDINGS: The heart and mediastinal contours are unchanged. Aortic calcification. No focal consolidation. No pulmonary edema. No pleural effusion. No pneumothorax. No acute osseous abnormality.  Right shoulder degenerative changes. IMPRESSION: 1. No active disease. 2.  Aortic Atherosclerosis (ICD10-I70.0). Electronically Signed   By: Iven Finn M.D.   On: 06/23/2021 17:49   DG Foot Complete Left  Result Date: 06/25/2021 CLINICAL DATA:  Recent fall with ankle and foot pain, initial encounter EXAM: LEFT FOOT - COMPLETE 3+ VIEW COMPARISON:  None. FINDINGS: Postsurgical changes in the first metatarsal are noted. Hallux valgus deformity seen. Tarsal degenerative changes are noted. Undisplaced fracture at the base of the third metatarsal is noted. Mild soft tissue swelling is noted about the ankle. IMPRESSION: Third metatarsal fracture proximally. Chronic changes about the tarsal bones. Electronically Signed   By: Inez Catalina M.D.   On: 06/25/2021 23:42   ECHOCARDIOGRAM COMPLETE  Result Date: 06/28/2021    ECHOCARDIOGRAM REPORT   Patient Name:   DAE ANTONUCCI Date of Exam: 06/28/2021 Medical Rec #:  [id number]     Height:        68.0 in Accession #:    [id number]    Weight:       164.5 lb Date of Birth:  20-Apr-1941     BSA:          1.881 m Patient Age:    2 years      BP:           110/57 mmHg Patient Gender: F             HR:           72 bpm. Exam Location:  [facility] Procedure: 2D Echo, Cardiac Doppler and Color Doppler Indications:     Murmur R01.1                  Syncope R55  History:         Patient has no prior history of Echocardiogram examinations.                  Risk Factors:Hypertension, Dyslipidemia and Sleep Apnea. CKD,                  pulmonary embolism.  Sonographer:     Sherrie Sport Referring Phys:  [id number] Patrecia Pour Diagnosing Phys: Nelva Bush MD  Sonographer Comments: Suboptimal apical window. IMPRESSIONS  1. Left ventricular ejection fraction, by estimation, is 55 to 60%. The left ventricle has normal function. Left ventricular endocardial border not optimally defined to  evaluate regional wall motion. There is mild left ventricular hypertrophy. Left ventricular diastolic parameters are consistent with Grade I diastolic dysfunction (impaired relaxation).  2. Right ventricular systolic function is normal. The right ventricular size is normal.  3. A small pericardial effusion is present. The pericardial effusion is circumferential.  4. The mitral valve is degenerative. Trivial mitral valve regurgitation. No evidence of mitral stenosis.  5. The aortic valve was not well visualized. Aortic valve regurgitation is not visualized. No aortic stenosis is present. FINDINGS  Left Ventricle: Left ventricular ejection fraction, by estimation, is 55 to 60%. The left ventricle has normal function. Left ventricular endocardial border not optimally defined to evaluate regional wall motion. The left ventricular internal cavity size was normal in size. There is mild left ventricular hypertrophy. Left ventricular diastolic parameters are consistent with Grade I diastolic dysfunction (impaired relaxation). Right Ventricle: The right  ventricular size is normal. No increase in right ventricular wall thickness. Right ventricular systolic function is normal. Left Atrium: Left atrial size was normal in size. Right Atrium: Right atrial size was normal in size. Pericardium: A small pericardial effusion is present. The pericardial effusion is circumferential. Mitral Valve: The mitral valve is degenerative in appearance. There is mild thickening of the mitral valve leaflet(s). Mild mitral annular calcification. Trivial mitral valve regurgitation. No evidence of mitral valve stenosis. MV peak gradient, 4.2 mmHg. The mean mitral valve gradient is 2.0 mmHg. Tricuspid Valve: The tricuspid valve is normal in structure. Tricuspid valve regurgitation is trivial. Aortic Valve: The aortic valve was not well visualized. Aortic valve regurgitation is not visualized. No aortic stenosis is present. Aortic valve mean gradient measures 4.0 mmHg. Aortic valve peak gradient measures 7.4 mmHg. Aortic valve area, by VTI measures 2.65 cm. Pulmonic Valve: The pulmonic valve was not well visualized. Aorta: The aortic root is normal in size and structure. Pulmonary Artery: The pulmonary artery is not well seen. IAS/Shunts: The interatrial septum was not well visualized.  LEFT VENTRICLE PLAX 2D LVIDd:         4.60 cm   Diastology LVIDs:         2.80 cm   LV e' medial:    6.64 cm/s LV PW:         1.20 cm   LV E/e' medial:  9.7 LV IVS:        0.90 cm   LV e' lateral:   9.14 cm/s LVOT diam:     2.00 cm   LV E/e' lateral: 7.0 LV SV:         60 LV SV Index:   32 LVOT Area:     3.14 cm  RIGHT VENTRICLE RV Basal diam:  3.30 cm RV S prime:     16.10 cm/s TAPSE (M-mode): 2.2 cm LEFT ATRIUM           Index        RIGHT ATRIUM           Index LA diam:      3.60 cm 1.91 cm/m   RA Area:     17.59 cm LA Vol (A4C): 35.7 ml 18.98 ml/m  RA Volume:   51.71 ml  27.49 ml/m  AORTIC VALVE AV Area (Vmax):    1.91 cm AV Area (Vmean):   1.71 cm AV Area (VTI):     2.65 cm AV Vmax:            136.00 cm/s AV Vmean:  87.300 cm/s AV VTI:            0.228 m AV Peak Grad:      7.4 mmHg AV Mean Grad:      4.0 mmHg LVOT Vmax:         82.90 cm/s LVOT Vmean:        47.400 cm/s LVOT VTI:          0.192 m LVOT/AV VTI ratio: 0.84  AORTA Ao Root diam: 2.93 cm MITRAL VALVE               TRICUSPID VALVE MV Area (PHT): 3.50 cm    TR Peak grad:   17.6 mmHg MV Area VTI:   2.94 cm    TR Vmax:        210.00 cm/s MV Peak grad:  4.2 mmHg MV Mean grad:  2.0 mmHg    SHUNTS MV Vmax:       1.03 m/s    Systemic VTI:  0.19 m MV Vmean:      63.9 cm/s   Systemic Diam: 2.00 cm MV Decel Time: 217 msec MV E velocity: 64.30 cm/s MV A velocity: 88.30 cm/s MV E/A ratio:  0.73 Harrell Gave End MD Electronically signed by Nelva Bush MD Signature Date/Time: 06/28/2021/8:44:22 AM    Final    CT Angio Chest/Abd/Pel for Dissection W and/or Wo Contrast  Result Date: 06/23/2021 CLINICAL DATA:  Chest pain or back pain, aortic dissection suspected. Syncope, Dehydration, CKD, now having Urinary Frequency and Epigastric Pain EXAM: CT ANGIOGRAPHY CHEST, ABDOMEN AND PELVIS TECHNIQUE: Non-contrast CT of the chest was initially obtained. Multidetector CT imaging through the chest, abdomen and pelvis was performed using the standard protocol during bolus administration of intravenous contrast. Multiplanar reconstructed images and MIPs were obtained and reviewed to evaluate the vascular anatomy. RADIATION DOSE REDUCTION: This exam was performed according to the departmental dose-optimization program which includes automated exposure control, adjustment of the mA and/or kV according to patient size and/or use of iterative reconstruction technique. CONTRAST:  81m OMNIPAQUE IOHEXOL 350 MG/ML SOLN COMPARISON:  CT angiography chest 06/21/2017 FINDINGS: CTA CHEST FINDINGS Cardiovascular Preferential opacification of the thoracic aorta. No evidence of thoracic aortic aneurysm or dissection. Normal heart size. No significant pericardial effusion.  The thoracic aorta is normal in caliber. No atherosclerotic plaque of the thoracic aorta. At least 2 vessel coronary artery calcifications. The main pulmonary artery is normal in caliber. No central or segmental pulmonary embolus. Mediastinum/Nodes: No enlarged mediastinal, hilar, or axillary lymph nodes. Thyroid gland, trachea, and esophagus demonstrate no significant findings. Lungs/Pleura: No focal consolidation. Several stable scattered calcified pulmonary micronodules. No pulmonary mass. No pleural effusion. No pneumothorax. Musculoskeletal: No chest wall abnormality. No suspicious lytic or blastic osseous lesions. No acute displaced fracture. Multilevel degenerative changes of the spine. Review of the MIP images confirms the above findings. CTA ABDOMEN AND PELVIS FINDINGS VASCULAR Aorta: Mild atherosclerotic. Normal caliber aorta without aneurysm, dissection, vasculitis or significant stenosis. Celiac: Mild atherosclerotic plaque. Patent without evidence of aneurysm, dissection, vasculitis or significant stenosis. SMA: Mild atherosclerotic plaque. Patent without evidence of aneurysm, dissection, vasculitis or significant stenosis. Renals: Moderate atherosclerotic plaque. Both renal arteries are patent without evidence of aneurysm, dissection, vasculitis, fibromuscular dysplasia or significant stenosis. IMA: Patent without evidence of aneurysm, dissection, vasculitis or significant stenosis. Inflow: Mild atherosclerotic plaque. Patent without evidence of aneurysm, dissection, vasculitis or significant stenosis. Veins: No obvious venous abnormality within the limitations of this arterial phase study. Review of the MIP images confirms  the above findings. NON-VASCULAR Hepatobiliary: No focal liver abnormality. Status post cholecystectomy. No biliary dilatation. Pancreas: No focal lesion. Normal pancreatic contour. No surrounding inflammatory changes. No main pancreatic ductal dilatation. Spleen: Normal in size  without focal abnormality. Adrenals/Urinary Tract: No adrenal nodule bilaterally. Bilateral kidneys enhance symmetrically.  Bilateral renal scarring. No hydronephrosis. No hydroureter. The urinary bladder is unremarkable. Stomach/Bowel: Roux-en-Y gastric bypass. Stomach is within normal limits. No evidence of bowel wall thickening or dilatation. Scattered colonic diverticulosis. Appendix appears normal. Lymphatic: No lymphadenopathy. Reproductive: Status post hysterectomy. No adnexal masses. Other: No intraperitoneal free fluid. No intraperitoneal free gas. No organized fluid collection. Musculoskeletal: No abdominal wall hernia or abnormality. No suspicious lytic or blastic osseous lesions. No acute displaced fracture. Levocurvature of the lumbar spine with associated degenerative changes. Intervertebral disc space vacuum phenomenon. Mild retrolisthesis of L3 on L4. Review of the MIP images confirms the above findings. IMPRESSION: 1. No acute aortic abnormality. 2. No central or segment pulmonary embolus. 3. No acute intrathoracic abnormality. 4. Colonic diverticulosis with no acute diverticulitis. Electronically Signed   By: Iven Finn M.D.   On: 06/23/2021 20:09    Microbiology: Results for orders placed or performed during the hospital encounter of 06/25/21  Urine Culture     Status: None   Collection Time: 06/26/21  2:57 AM   Specimen: Urine, Clean Catch  Result Value Ref Range Status   Specimen Description   Final    URINE, CLEAN CATCH Performed at [facility], [address] [place], [place] [address]    Special Requests   Final    NONE Performed at [facility], [address], [place], [place] [address]    Culture   Final    NO GROWTH Performed at [facility], [place] [address], [place], [place] [address]    Report Status 06/27/2021 FINAL  Final  Resp Panel by RT-PCR (Flu A&B, Covid) Nasopharyngeal Swab     Status: None   Collection Time: 06/26/21  4:18  AM   Specimen: Nasopharyngeal Swab; Nasopharyngeal(NP) swabs in vial transport medium  Result Value Ref Range Status   SARS Coronavirus 2 by RT PCR NEGATIVE NEGATIVE Final    Comment: (NOTE) SARS-CoV-2 target nucleic acids are NOT DETECTED.  The SARS-CoV-2 RNA is generally detectable in upper respiratory specimens during the acute phase of infection. The lowest concentration of SARS-CoV-2 viral copies this assay can detect is 138 copies/mL. A negative result does not preclude SARS-Cov-2 infection and should not be used as the sole basis for treatment or other patient management decisions. A negative result may occur with  improper specimen collection/handling, submission of specimen other than nasopharyngeal swab, presence of viral mutation(s) within the areas targeted by this assay, and inadequate number of viral copies(<138 copies/mL). A negative result must be combined with clinical observations, patient history, and epidemiological information. The expected result is Negative.  Fact Sheet for Patients:  EntrepreneurPulse.com.au  Fact Sheet for Healthcare Providers:  IncredibleEmployment.be  This test is no t yet approved or cleared by the [place] FDA and  has been authorized for detection and/or diagnosis of SARS-CoV-2 by FDA under an Emergency Use Authorization (EUA). This EUA will remain  in effect (meaning this test can be used) for the duration of the COVID-19 declaration under Section 564(b)(1) of the Act, 21 U.S.C.section 360bbb-3(b)(1), unless the authorization is terminated  or revoked sooner.       Influenza A by PCR NEGATIVE NEGATIVE Final   Influenza B  by PCR NEGATIVE NEGATIVE Final    Comment: (NOTE) The Xpert Xpress SARS-CoV-2/FLU/RSV plus assay is intended as an aid in the diagnosis of influenza from Nasopharyngeal swab specimens and should not be used as a sole basis for treatment. Nasal washings and aspirates are  unacceptable for Xpert Xpress SARS-CoV-2/FLU/RSV testing.  Fact Sheet for Patients: EntrepreneurPulse.com.au  Fact Sheet for Healthcare Providers: IncredibleEmployment.be  This test is not yet approved or cleared by the [place] FDA and has been authorized for detection and/or diagnosis of SARS-CoV-2 by FDA under an Emergency Use Authorization (EUA). This EUA will remain in effect (meaning this test can be used) for the duration of the COVID-19 declaration under Section 564(b)(1) of the Act, 21 U.S.C. section 360bbb-3(b)(1), unless the authorization is terminated or revoked.  Performed at [facility], [address] [place], [place] [address]   MRSA Next Gen by PCR, Nasal     Status: None   Collection Time: 06/28/21  3:48 PM   Specimen: Nasal Mucosa; Nasal Swab  Result Value Ref Range Status   MRSA by PCR Next Gen NOT DETECTED NOT DETECTED Final    Comment: (NOTE) The GeneXpert MRSA Assay (FDA approved for NASAL specimens only), is one component of a comprehensive MRSA colonization surveillance program. It is not intended to diagnose MRSA infection nor to guide or monitor treatment for MRSA infections. Test performance is not FDA approved in patients less than 42 years old. Performed at [facility], [address] [place], [place] [address]     Labs: CBC: Recent Labs  Lab 06/30/21 [phone] 07/01/21 0313 07/02/21 0416 07/02/21 1429 07/03/21 0541 07/04/21 0404  WBC 7.1 6.7 5.3  --  5.3 6.0  HGB 7.6* 7.2* 6.7* 8.9* 8.9* 8.7*  HCT 22.8* 22.4* 20.8* 27.9* 26.8* 26.8*  MCV 91.6 94.5 95.4  --  93.1 94.0  PLT 159 166 169  --  192 355   Basic Metabolic Panel: Recent Labs  Lab 06/29/21 0357 06/30/21 0946 07/02/21 0416 07/03/21 0541 07/04/21 0404  NA 140 140 141 138 139  K 3.9 3.6 3.5 3.6 3.8  CL 112* 112* 113* 112* 110  CO2 '24 22 25 22 24  '$ GLUCOSE 90 132* 123* 87 89  BUN 63* 34* 30* 22 20  CREATININE  1.26* 1.02* 1.29* 1.09* 1.14*  CALCIUM 8.0* 8.0* 7.8* 8.0* 8.7*   Liver Function Tests: Recent Labs  Lab 06/28/21 1401  AST 17  ALT 9  ALKPHOS 26*  BILITOT 0.3  PROT 4.2*  ALBUMIN 2.1*   CBG: Recent Labs  Lab 06/28/21 0745 06/28/21 1545  GLUCAP 93 88    Discharge time spent: less than 30 minutes.  Signed: Annita Brod, MD [facility] 07/04/2021

## 2021-07-04 NOTE — Telephone Encounter (Signed)
-----   Message from Lin Landsman, MD sent at 07/03/2021 11:25 AM EDT ----- ?Regarding: reepat EGD ?Please schedule EGD with Dr Vicente Males in 8weeks ?Dx: follow up of anastomotic ulcer ? ?RV ? ?

## 2021-07-05 LAB — H PYLORI, IGM, IGG, IGA AB
H Pylori IgG: <0.01 Index Value (ref 0.00–0.79)
H. Pylogi, Iga Abs: <9 units (ref 0.0–8.9)
H. Pylogi, Igm Abs: <9 units (ref 0.0–8.9)

## 2021-07-05 NOTE — Telephone Encounter (Signed)
Kelsey advised.   Thanks,   -Trine Fread  

## 2021-07-05 NOTE — Telephone Encounter (Signed)
OK for PT and OT as requested ?

## 2021-07-05 NOTE — Telephone Encounter (Signed)
Will discuss at upcoming appointment

## 2021-07-06 ENCOUNTER — Other Ambulatory Visit: Payer: Self-pay

## 2021-07-06 ENCOUNTER — Telehealth: Payer: Self-pay

## 2021-07-06 DIAGNOSIS — D508 Other iron deficiency anemias: Secondary | ICD-10-CM

## 2021-07-06 NOTE — Telephone Encounter (Signed)
Patient was scheduled to have an EGD on 08/29/2021. ?

## 2021-07-06 NOTE — Telephone Encounter (Signed)
Copied from [place] [phone]. Topic: General - Other ?>> Jul 05, 2021  4:17 PM Tessa Lerner A wrote: ?Reason for CRM: Soni with [facility] has called to share that the patient has declined to start physical therapy this week and would like to begin next week  ? ?The patient will be evaluated during the week of 3/20-3/24 at a date tbd  ? ?Please contact further if needed ?

## 2021-07-07 ENCOUNTER — Telehealth: Payer: Self-pay

## 2021-07-07 NOTE — Telephone Encounter (Signed)
They did not prescribe her any pain medication at discharge. I can not send in pain medication for her without seeing her. I recommend she schedule a hospital follow up with me if she does not already have one scheduled. ?

## 2021-07-07 NOTE — Telephone Encounter (Signed)
noted 

## 2021-07-07 NOTE — Telephone Encounter (Signed)
Copied from [place] [phone]. Topic: General - Other ?>> Jul 07, 2021 12:36 PM Yvette Rack wrote: ?Reason for CRM: Mateo Flow reports pt broke her foot but she never got the Rx for pain medication filled. [name] requests that the Rx for a pain medication be sent to [place] [place] [facility], [address] [facility] [place] Phone: [phone]  Fax: [phone]. ?

## 2021-07-07 NOTE — Telephone Encounter (Signed)
Pt advised.  She is not able to get out of the house right now.  Would it be okay to do a virtual visit?  Could you work her in Friday? ? ? ?Thanks,  ? ?-Mickel Baas  ?

## 2021-07-08 ENCOUNTER — Inpatient Hospital Stay: Payer: Medicare PPO | Admitting: Internal Medicine

## 2021-07-08 NOTE — Telephone Encounter (Signed)
I can not work her in today. Why is she unable to leave the house. She was put in a boot. When does she follow up with ortho. ?

## 2021-07-12 ENCOUNTER — Telehealth: Payer: Self-pay

## 2021-07-12 NOTE — Telephone Encounter (Signed)
Copied from [place] [phone]. Topic: Quick Communication - Home Health Verbal Orders ?>> Jul 11, 2021  2:49 PM Pawlus, Brayton Layman A wrote: ?Caller/Agency: [facility]  ?Callback Number: [phone] ?Requesting: PT ?Frequency: 1x8 ? ?Caller also reported he thinks the pt needs to come in for a follow up visit FYI ?

## 2021-07-12 NOTE — Telephone Encounter (Signed)
[place] for verbal orders as requested. If she wants to schedule a follow up visit, she can ?

## 2021-07-12 NOTE — Telephone Encounter (Signed)
Ann Russell from [facility] advised.  She is going to have Ann Russell call to schedule a virtual visit. (Pt is non weight bearing at this time) ? ?Thanks,  ? ?-Mickel Baas  ?

## 2021-07-18 ENCOUNTER — Telehealth: Payer: Self-pay | Admitting: Internal Medicine

## 2021-07-18 ENCOUNTER — Emergency Department
Admission: EM | Admit: 2021-07-18 | Discharge: 2021-07-18 | Disposition: A | Discharge status: Home / Self Care | Payer: Medicare PPO | Attending: Emergency Medicine | Admitting: Emergency Medicine

## 2021-07-18 ENCOUNTER — Other Ambulatory Visit: Payer: Self-pay

## 2021-07-18 ENCOUNTER — Encounter: Payer: Self-pay | Admitting: Emergency Medicine

## 2021-07-18 DIAGNOSIS — R55 Syncope and collapse: Secondary | ICD-10-CM | POA: Insufficient documentation

## 2021-07-18 DIAGNOSIS — I129 Hypertensive chronic kidney disease with stage 1 through stage 4 chronic kidney disease, or unspecified chronic kidney disease: Secondary | ICD-10-CM | POA: Diagnosis not present

## 2021-07-18 DIAGNOSIS — N189 Chronic kidney disease, unspecified: Secondary | ICD-10-CM | POA: Insufficient documentation

## 2021-07-18 DIAGNOSIS — Z7901 Long term (current) use of anticoagulants: Secondary | ICD-10-CM | POA: Insufficient documentation

## 2021-07-18 DIAGNOSIS — E860 Dehydration: Secondary | ICD-10-CM | POA: Insufficient documentation

## 2021-07-18 DIAGNOSIS — R42 Dizziness and giddiness: Secondary | ICD-10-CM | POA: Diagnosis not present

## 2021-07-18 LAB — HEPATIC FUNCTION PANEL
ALT: 13 U/L (ref 0–44)
AST: 29 U/L (ref 15–41)
Albumin: 3.3 g/dL — ABNORMAL LOW (ref 3.5–5.0)
Alkaline Phosphatase: 60 U/L (ref 38–126)
Bilirubin, Direct: 0.2 mg/dL (ref 0.0–0.2)
Indirect Bilirubin: 0.5 mg/dL (ref 0.3–0.9)
Total Bilirubin: 0.7 mg/dL (ref 0.3–1.2)
Total Protein: 6 g/dL — ABNORMAL LOW (ref 6.5–8.1)

## 2021-07-18 LAB — BASIC METABOLIC PANEL
Anion gap: 8 (ref 5–15)
BUN: 37 mg/dL — ABNORMAL HIGH (ref 8–23)
CO2: 26 mmol/L (ref 22–32)
Calcium: 10.4 mg/dL — ABNORMAL HIGH (ref 8.9–10.3)
Chloride: 101 mmol/L (ref 98–111)
Creatinine, Ser: 1.48 mg/dL — ABNORMAL HIGH (ref 0.44–1.00)
GFR, Estimated: 36 mL/min — ABNORMAL LOW (ref >60)
Glucose, Bld: 114 mg/dL — ABNORMAL HIGH (ref 70–99)
Potassium: 4.4 mmol/L (ref 3.5–5.1)
Sodium: 135 mmol/L (ref 135–145)

## 2021-07-18 LAB — CBC
HCT: 35.4 % — ABNORMAL LOW (ref 36.0–46.0)
Hemoglobin: 11 g/dL — ABNORMAL LOW (ref 12.0–15.0)
MCH: 31 pg (ref 26.0–34.0)
MCHC: 31.1 g/dL (ref 30.0–36.0)
MCV: 99.7 fL (ref 80.0–100.0)
Platelets: 141 10*3/uL — ABNORMAL LOW (ref 150–400)
RBC: 3.55 MIL/uL — ABNORMAL LOW (ref 3.87–5.11)
RDW: 18.9 % — ABNORMAL HIGH (ref 11.5–15.5)
WBC: 5.7 10*3/uL (ref 4.0–10.5)
nRBC: 0 % (ref 0.0–0.2)

## 2021-07-18 LAB — TROPONIN I (HIGH SENSITIVITY): Troponin I (High Sensitivity): 10 ng/L (ref <18)

## 2021-07-18 LAB — LIPASE, BLOOD: Lipase: 53 U/L — ABNORMAL HIGH (ref 11–51)

## 2021-07-18 MED ORDER — LACTATED RINGERS IV BOLUS
1000.0000 mL | Freq: Once | INTRAVENOUS | Status: AC
  Administered 2021-07-18: 1000 mL via INTRAVENOUS

## 2021-07-18 NOTE — ED Notes (Signed)
Pt and daughter gave verbal consent to DC  ?

## 2021-07-18 NOTE — Telephone Encounter (Signed)
Copied from [place] [phone]. Topic: General - Other ?>> Jul 18, 2021  8:20 AM Leward Quan A wrote: ?Reason for CRM: [place] called to inform [facility] Silversmith that patient has refused OT services so they will not be seeing her. Any further questions call Colletta [place] at  Ph# [phone] ?

## 2021-07-18 NOTE — ED Provider Notes (Signed)
? ?[facility] ?Provider Note ? ? ? Event Date/Time  ? First MD Initiated Contact with Patient 07/18/21 1547   ?  (approximate) ? ? ?History  ? ?Chief Complaint ?Near Syncope ? ? ?HPI ? ?Ann Russell is a 81 y.o. female with past medical history of hypertension, CKD, PE on Eliquis, peripheral neuropathy, and recent GI bleed who presents to the ED complaining of lightheadedness and near syncope.  Patient reports that about an hour prior to arrival she suddenly started to feel dizzy and lightheaded while sitting in her chair.  She states she felt like she was about to pass out but never fully lost consciousness.  She denies any associated chest pain or shortness of breath, does states she has been dealing with intermittent pain in her epigastrium following admission to the hospital for bleeding ulcer a couple of weeks ago.  She states the pain is unchanged today and she denies any blood in her stool, nausea, vomiting, or diarrhea.  Her Eliquis was stopped during recent admission due to bleeding and not restarted at discharge.  She has not followed up with her PCP since discharge from the hospital. ?  ? ? ?Physical Exam  ? ?Triage Vital Signs: ?ED Triage Vitals  ?Enc Vitals Group  ?   BP 07/18/21 1524 (!) 113/98  ?   Pulse Rate 07/18/21 1524 64  ?   Resp 07/18/21 1524 17  ?   Temp 07/18/21 1524 98.4 ?F (36.9 ?C)  ?   Temp Source 07/18/21 1524 Oral  ?   SpO2 07/18/21 1524 100 %  ?   Weight 07/18/21 1420 164 lb 7.4 oz (74.6 kg)  ?   Height 07/18/21 1420 '5\' 8"'$  (1.727 m)  ?   Head Circumference --   ?   Peak Flow --   ?   Pain Score --   ?   Pain Loc --   ?   Pain Edu? --   ?   Excl. in [place]? --   ? ? ?Most recent vital signs: ?Vitals:  ? 07/18/21 1615 07/18/21 1630  ?BP: 104/87 (!) 162/82  ?Pulse: 62 63  ?Resp:    ?Temp:    ?SpO2: 95% 97%  ? ? ?Constitutional: Alert and oriented. ?Eyes: Conjunctivae are normal. ?Head: Atraumatic. ?Nose: No congestion/rhinnorhea. ?Mouth/Throat: Mucous membranes are  moist.  ?Cardiovascular: Normal rate, regular rhythm. Grossly normal heart sounds.  2+ radial pulses bilaterally. ?Respiratory: Normal respiratory effort.  No retractions. Lungs CTAB. ?Gastrointestinal: Soft and nontender. No distention. ?Musculoskeletal: No lower extremity tenderness nor edema.  ?Neurologic:  Normal speech and language. No gross focal neurologic deficits are appreciated. ? ? ? ?ED Results / Procedures / Treatments  ? ?Labs ?(all labs ordered are listed, but only abnormal results are displayed) ?Labs Reviewed  ?BASIC METABOLIC PANEL - Abnormal; Notable for the following components:  ?    Result Value  ? Glucose, Bld 114 (*)   ? BUN 37 (*)   ? Creatinine, Ser 1.48 (*)   ? Calcium 10.4 (*)   ? GFR, Estimated 36 (*)   ? All other components within normal limits  ?CBC - Abnormal; Notable for the following components:  ? RBC 3.55 (*)   ? Hemoglobin 11.0 (*)   ? HCT 35.4 (*)   ? RDW 18.9 (*)   ? Platelets 141 (*)   ? All other components within normal limits  ?HEPATIC FUNCTION PANEL - Abnormal; Notable for the following components:  ? Total  Protein 6.0 (*)   ? Albumin 3.3 (*)   ? All other components within normal limits  ?LIPASE, BLOOD - Abnormal; Notable for the following components:  ? Lipase 53 (*)   ? All other components within normal limits  ?URINALYSIS, ROUTINE W REFLEX MICROSCOPIC  ?CBG MONITORING, ED  ?TROPONIN I (HIGH SENSITIVITY)  ? ? ? ?EKG ? ?ED ECG REPORT ?Tempie Hoist, the attending physician, personally viewed and interpreted this ECG. ? ? Date: 07/18/2021 ? EKG Time: 15:29 ? Rate: 63 ? Rhythm: normal sinus rhythm ? Axis: Normal ? Intervals:none ? ST&T Change: None ? ? ?PROCEDURES: ? ?Critical Care performed: No ? ?.1-3 Lead EKG Interpretation ?Performed by: Blake Divine, MD ?Authorized by: Blake Divine, MD  ? ?  Interpretation: normal   ?  ECG rate:  65-80 ?  ECG rate assessment: normal   ?  Rhythm: sinus rhythm   ?  Ectopy: none   ?  Conduction: normal   ? ? ?MEDICATIONS  ORDERED IN ED: ?Medications  ?lactated ringers bolus 1,000 mL (1,000 mLs Intravenous New Bag/Given 07/18/21 1621)  ? ? ? ?IMPRESSION / MDM / ASSESSMENT AND PLAN / ED COURSE  ?I reviewed the triage vital signs and the nursing notes. ?             ?               ? ?81 y.o. female with past medical history of hypertension, CKD, PE on Eliquis, peripheral neuropathy, and bleeding PUD who presents to the ED following episode of lightheadedness and near syncope at home while sitting in a chair. ? ?Differential diagnosis includes, but is not limited to, arrhythmia, ACS, PE, dehydration, chart abnormality, AKI, anemia, vasovagal episode. ? ?Patient is nontoxic-appearing and in no acute distress, patient initially noted to be borderline hypotensive with EMS but this normalized without intervention and vital signs are reassuring here in the ED.  Patient states that she feels slightly weak but mostly back to normal, denies any associated chest pain or shortness of breath.  EKG shows no evidence of arrhythmia or ischemia and given lack of chest pain or shortness of breath, low suspicion for PE.  We will observe on cardiac monitor and screen troponin, additional labs as well as UA are also pending.  Plan to hydrate with IV fluids and reassess. ? ?The patient is on the cardiac monitor to evaluate for evidence of arrhythmia and/or significant heart rate changes. ? ?No events noted on cardiac monitor, troponin within normal limits.  CBC shows anemia that is improved compared to previous, BMP does show mild AKI and I suspect symptoms are secondary to dehydration.  Patient was given IV fluid bolus and reports feeling better.  She was offered admission for further syncope work-up, but declines and prefers to be discharged home.  She was counseled to follow-up with her PCP for recheck of her kidney function and to follow-up with cardiology.  She was counseled to return to the ED for new worsening symptoms, patient agrees with plan. ? ?   ? ? ?FINAL CLINICAL IMPRESSION(S) / ED DIAGNOSES  ? ?Final diagnoses:  ?Near syncope  ?Dehydration  ? ? ? ?Rx / DC Orders  ? ?ED Discharge Orders   ? ? None  ? ?  ? ? ? ?Note:  This document was prepared using Dragon voice recognition software and may include unintentional dictation errors. ?  ?Blake Divine, MD ?07/18/21 1823 ? ?

## 2021-07-18 NOTE — ED Triage Notes (Signed)
First Nurse NOte:  BIB [facility].  CAlled for near syncope.  Initial BP 90/50, second check 113/70 without intervention.  VS wnl.  P:  78  CBG:  157 R: 20  ETCO2:  33 ? ?AAOx3.  Skin warm and dry. NAD ?

## 2021-07-19 NOTE — Telephone Encounter (Signed)
Noted, I also saw she ended back up in the ER ?

## 2021-07-26 ENCOUNTER — Telehealth: Payer: Medicare PPO | Admitting: Internal Medicine

## 2021-07-27 ENCOUNTER — Other Ambulatory Visit (INDEPENDENT_AMBULATORY_CARE_PROVIDER_SITE_OTHER): Payer: Medicare PPO | Admitting: Internal Medicine

## 2021-07-27 VITALS — BP 123/62 | HR 68

## 2021-07-27 DIAGNOSIS — K250 Acute gastric ulcer with hemorrhage: Secondary | ICD-10-CM

## 2021-07-27 DIAGNOSIS — R531 Weakness: Secondary | ICD-10-CM | POA: Diagnosis not present

## 2021-07-27 DIAGNOSIS — R296 Repeated falls: Secondary | ICD-10-CM | POA: Diagnosis not present

## 2021-07-27 DIAGNOSIS — M7989 Other specified soft tissue disorders: Secondary | ICD-10-CM

## 2021-07-27 DIAGNOSIS — E860 Dehydration: Secondary | ICD-10-CM

## 2021-07-27 DIAGNOSIS — I9589 Other hypotension: Secondary | ICD-10-CM

## 2021-07-27 DIAGNOSIS — R55 Syncope and collapse: Secondary | ICD-10-CM

## 2021-07-27 DIAGNOSIS — R6889 Other general symptoms and signs: Secondary | ICD-10-CM

## 2021-07-27 DIAGNOSIS — D62 Acute posthemorrhagic anemia: Secondary | ICD-10-CM

## 2021-07-27 DIAGNOSIS — R269 Unspecified abnormalities of gait and mobility: Secondary | ICD-10-CM

## 2021-07-27 DIAGNOSIS — N3941 Urge incontinence: Secondary | ICD-10-CM

## 2021-07-27 DIAGNOSIS — S82832A Other fracture of upper and lower end of left fibula, initial encounter for closed fracture: Secondary | ICD-10-CM

## 2021-07-27 DIAGNOSIS — S92335A Nondisplaced fracture of third metatarsal bone, left foot, initial encounter for closed fracture: Secondary | ICD-10-CM

## 2021-07-27 DIAGNOSIS — N179 Acute kidney failure, unspecified: Secondary | ICD-10-CM

## 2021-07-27 NOTE — Progress Notes (Signed)
? ?Subjective:  ? ? Patient ID: Ann Russell, female    DOB: [birth year], 81 y.o.   MRN: [id number] ? ?HPI ? ?Pt presents to the clinic today for multiple ER and TCM hospital follow-up.  She was sent to the ER by this provider 3/2 for recurrent syncopal episodes and abdominal pain.  CT head at that time was negative.  CT chest abdomen pelvis did not show any acute findings.  They felt like her syncopal episodes were related to dehydration and she was given IV fluids.  She was discharged and advised to follow-up with her PCP.   ? ?She subsequently presented back to the ER 3/4 with complaint of left ankle pain status post another fall.  X-ray showed nondisplaced closed fracture of the distal left fibula and proximal left third metatarsal.  She was placed in a splint and seen by podiatry who did not recommend surgical intervention at this time.  She was found to have a UTI and started on IV antibiotics although urine culture did not grow any bacteria.  Her hospital course was complicated by drop in her hemoglobin to 4.  CT of the abdomen pelvis was unremarkable.  GI was consulted.  Patient underwent upper GI which showed ulcer at the [place] anastomosis.  Her Eliquis was discontinued.  She did have an IVC filter placed on 3/10.  She was started on a PPI 2 times daily.  She was also started on midodrine for orthostasis.  She was discharged home 3/14, after refusal to go to a skilled nursing facility for rehab.  She was discharged with home PT which she has essentially refused today.  Since discharge.  She reports she is unable to even transfer from the bed to the bedside commode.  She is essentially bedbound and reports lightheadedness and dizziness with pretty much any movement.  She has been trying to stay well-hydrated.  She reports upper abdominal pain that resolved a few days ago.  She reports minimal pain in her left lower extremity but is essentially nonweightbearing at this time.  She reports she has follow-ups with  podiatry and gastroenterology but is physically not able to get up and get out of the house for these appointments. ? ?She presented back to the ED 3/27 with complaint of near syncope.  ECG was unchanged.  Her labs showed acute kidney injury.  She was treated with an IV fluid bolus with resolution of symptoms.  She was discharged home and advised to follow-up with PCP. ? ?Review of Systems ? ?   ?Past Medical History:  ?Diagnosis Date  ? 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 Cancer [facility])   ? 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 Chronic kidney disease   ? [place] Kitchen  Takes Vasotec for kidneys.-stage 3  ? Depression   ? Hyperlipemia   ? Hypertension   ? h/o had gastric bypass and was able to come off meds  ? Neuropathy   ? Pulmonary embolism ([place])   ? Sleep apnea   ? no cpap since her gastric bypass  ? ? ?Current Outpatient Medications  ?Medication Sig Dispense Refill  ? acetaminophen (TYLENOL) 650 MG CR tablet Take 650 mg by mouth 2 (two) times a day.    ? B COMPLEX VITAMINS SL Place 1 tablet under the tongue daily.    ? busPIRone (BUSPAR) 15 MG tablet Take 1 tablet (15 mg total) by mouth 2 (two) times daily. 180 tablet 0  ? calcitRIOL (ROCALTROL) 0.25  MCG capsule Take 1 capsule (0.25 mcg total) by mouth daily. 90 capsule 1  ? Calcium Carb-Cholecalciferol (CALCIUM 600 + D PO) Take 1 tablet by mouth daily.    ? denosumab (PROLIA) 60 MG/ML SOSY injection Inject 60 mg into the skin every 6 (six) months.    ? ferrous [id number] C-folic acid (TRINSICON / FOLTRIN) capsule Take 1 capsule by mouth 2 (two) times daily. 20 capsule   ? FLUoxetine (PROZAC) 20 MG capsule Take 3 capsules (60 mg total) by mouth every morning. (Patient taking differently: Take 20 mg by mouth 3 (three) times daily.) 270 capsule 0  ? midodrine (PROAMATINE) 5 MG tablet Take 1 tablet (5 mg total) by mouth 3 (three) times daily with meals. 90 tablet 1  ? Multiple Vitamin (MULTI-VITAMINS) TABS Take 2 tablets by mouth 2 (two) times daily.    ? omeprazole (PRILOSEC)  20 MG capsule Take 1 capsule (20 mg total) by mouth 2 (two) times daily before a meal. 60 capsule 2  ? pregabalin (LYRICA) 100 MG capsule Take 1 capsule (100 mg total) by mouth 3 (three) times daily. 270 capsule 0  ? Pyridoxine HCl (VITAMIN B6 PO) Take 1 tablet by mouth daily. With b12    ? valACYclovir (VALTREX) 1000 MG tablet Take 1 tablet (1,000 mg total) by mouth daily as needed (fever blisters). 30 tablet 2  ? ?No current facility-administered medications for this visit.  ? ? ?No Known Allergies ? ?Family History  ?Problem Relation Age of Onset  ? Heart disease Mother   ? Dementia Mother   ? Neuropathy Father   ? Diabetes Brother   ? Breast cancer Neg Hx   ? ? ?Social History  ? ?Socioeconomic History  ? Marital status: Significant Other  ?  Spouse name: Not on file  ? Number of children: 1  ? Years of education: Not on file  ? Highest education level: Not on file  ?Occupational History  ? Occupation: retired  ?Tobacco Use  ? Smoking status: Never  ? Smokeless tobacco: Never  ?Vaping Use  ? Vaping Use: Never used  ?Substance and Sexual Activity  ? Alcohol use: No  ? Drug use: No  ? Sexual activity: Not Currently  ?Other Topics Concern  ? Not on file  ?Social History Narrative  ? Not on file  ? ?Social Determinants of Health  ? ?Financial Resource Strain: Not on file  ?Food Insecurity: Not on file  ?Transportation Needs: Not on file  ?Physical Activity: Not on file  ?Stress: Not on file  ?Social Connections: Not on file  ?Intimate Partner Violence: Not on file  ? ? ? ?Constitutional: Denies fever, malaise, fatigue, headache or abrupt weight changes.  ?HEENT: Denies eye pain, eye redness, ear pain, ringing in the ears, wax buildup, runny nose, nasal congestion, bloody nose, or sore throat. ?Respiratory: Denies difficulty breathing, shortness of breath, cough or sputum production.   ?Cardiovascular: Patient reports left leg swelling.  Denies chest pain, chest tightness, palpitations or swelling in the hands.   ?Gastrointestinal: Patient reports intermittent abdominal pain.  Denies  bloating, constipation, diarrhea or blood in the stool.  ?GU: Patient reports urge incontinence.  Denies requency, pain with urination, burning sensation, blood in urine, odor or discharge. ?Musculoskeletal: Patient reports intermittent left lower leg pain, difficulty with gait.  Denies decrease in range of motion, muscle pain or joint swelling.  ?Skin: Denies redness, rashes, lesions or ulcercations.  ?Neurological: Patient reports lightheadedness and dizziness, difficulty with balance, neuropathy.  Denies difficulty with memory, difficulty with speech or problems with  coordination.  ?Psych: Denies anxiety, depression, SI/HI. ? ?No other specific complaints in a complete review of systems (except as listed in HPI above). ? ?Objective:  ? Physical Exam ? ?BP 123/62   Pulse 68  ? ?Wt Readings from Last 3 Encounters:  ?07/18/21 164 lb 7.4 oz (74.6 kg)  ?06/26/21 164 lb 7.4 oz (74.6 kg)  ?06/20/21 171 lb 15.3 oz (78 kg)  ? ? ?General: Appears her stated age, in NAD. ?Skin: Bruising noted to the right lower extremity.  Bedroom smells of urine. ?Cardiovascular: Normal rate and rhythm. S1,S2 noted.  No murmur, rubs or gallops noted.  1+ nonpitting edema noted to BLE. ?Pulmonary/Chest: Normal effort and positive vesicular breath sounds. No respiratory distress. No wheezes, rales or ronchi noted.  ?Abdomen: Soft and nontender. Normal bowel sounds.  ?Musculoskeletal: Patient is bedbound.  Unable to assess gait or ROM of the knee. ?Neurological: Alert and oriented.  ?Psychiatric: Mood and affect normal. Behavior is normal. Judgment and thought content normal.  ? ? ?BMET ?   ?Component Value Date/Time  ? NA 135 07/18/2021 1536  ? K 4.4 07/18/2021 1536  ? CL 101 07/18/2021 1536  ? CO2 26 07/18/2021 1536  ? GLUCOSE 114 (H) 07/18/2021 1536  ? BUN 37 (H) 07/18/2021 1536  ? CREATININE 1.48 (H) 07/18/2021 1536  ? CREATININE 1.58 (H) 06/23/2021 1550  ?  CALCIUM 10.4 (H) 07/18/2021 1536  ? GFRNONAA 36 (L) 07/18/2021 1536  ? GFRAA 30 (L) 09/20/2019 1418  ? ? ?Lipid Panel  ?No results found for: CHOL, TRIG, HDL, CHOLHDL, VLDL, LDLCALC ? ?CBC ?   ?Component Value Dat

## 2021-07-28 ENCOUNTER — Other Ambulatory Visit: Payer: Self-pay | Admitting: Internal Medicine

## 2021-07-28 ENCOUNTER — Ambulatory Visit: Payer: Self-pay | Admitting: *Deleted

## 2021-07-28 NOTE — Telephone Encounter (Signed)
Discontinued 05/23/21. Not on current med profile ? ?Requested Prescriptions  ?Pending Prescriptions Disp Refills  ?? ELIQUIS 5 MG TABS tablet [Pharmacy Med Name: ELIQUIS '5MG'$  TABLETS] 180 tablet 0  ?  Sig: TAKE 1 TABLET(5 MG) BY MOUTH TWICE DAILY  ?  ? Hematology:  Anticoagulants - apixaban Failed - 07/28/2021 10:13 AM  ?  ?  Failed - PLT in normal range and within 360 days  ?  Platelets  ?Date Value Ref Range Status  ?07/18/2021 141 (L) 150 - 400 K/uL Final  ?   ?  ?  Failed - HGB in normal range and within 360 days  ?  Hemoglobin  ?Date Value Ref Range Status  ?07/18/2021 11.0 (L) 12.0 - 15.0 g/dL Final  ?   ?  ?  Failed - HCT in normal range and within 360 days  ?  HCT  ?Date Value Ref Range Status  ?07/18/2021 35.4 (L) 36.0 - 46.0 % Final  ?   ?  ?  Failed - Cr in normal range and within 360 days  ?  Creat  ?Date Value Ref Range Status  ?06/23/2021 1.58 (H) 0.60 - 1.00 mg/dL Final  ? ?Creatinine, Ser  ?Date Value Ref Range Status  ?07/18/2021 1.48 (H) 0.44 - 1.00 mg/dL Final  ?   ?  ?  Passed - AST in normal range and within 360 days  ?  AST  ?Date Value Ref Range Status  ?07/18/2021 29 15 - 41 U/L Final  ?   ?  ?  Passed - ALT in normal range and within 360 days  ?  ALT  ?Date Value Ref Range Status  ?07/18/2021 13 0 - 44 U/L Final  ?   ?  ?  Passed - Valid encounter within last 12 months  ?  Recent Outpatient Visits   ?      ? 1 month ago Epigastric pain  ? [facility] [place], [place] W, NP  ? 5 months ago Aortic atherosclerosis [facility])  ? [facility] [place], Coralie Keens, NP  ?  ?  ? ?  ?  ?  ? ? ?

## 2021-07-28 NOTE — Telephone Encounter (Signed)
?  Chief Complaint: requesting referral to [facility] as discussed yesterday ?Symptoms: frequent falls- patient has fallen twice since visit yesterday ?Frequency:   ?Pertinent Negatives: Patient denies injury ?Disposition: '[]'$ ED /'[]'$ Urgent Care (no appt availability in office) / '[]'$ Appointment(In office/virtual)/ '[]'$  [facility]/ '[]'$ Home Care/ '[]'$ Refused Recommended Disposition /'[]'$ [facility]/ '[x]'$  Follow-up with PCP ?Additional Notes: Patient's significant other calling- patient has fallen twice since visit yesterday- no injury reported- patient wants to start process for care at [facility] as discussed yesterday ?
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
## 2021-07-29 ENCOUNTER — Encounter: Payer: Self-pay | Admitting: Internal Medicine

## 2021-07-29 ENCOUNTER — Ambulatory Visit: Payer: Medicare PPO | Admitting: Cardiovascular Disease

## 2021-07-29 NOTE — Patient Instructions (Signed)
Weakness Weakness is a lack of strength. You may feel weak all over your body (generalized), or you may feel weak in one part of your body (focal). There are many potential causes of weakness. Sometimes, the cause of your weakness may not be known. Some causes of weakness can be serious, so it isimportant to see your doctor. Follow these instructions at home: Activity Rest as needed. Try to get enough sleep. Most adults need 7-8 hours of sleep each night. Talk to your doctor about how much sleep you need each night. Do exercises, such as arm curls and leg raises, for 30 minutes at least 2 days a week or as told by your doctor. Think about working with a physical therapist or trainer to help you get stronger. General instructions  Take over-the-counter and prescription medicines only as told by your doctor. Eat a healthy, well-balanced diet. This includes: Proteins to build muscles, such as lean meats and fish. Fresh fruits and vegetables. Carbohydrates to boost energy, such as whole grains. Drink enough fluid to keep your pee (urine) pale yellow. Keep all follow-up visits as told by your doctor. This is important.  Contact a doctor if: Your weakness does not get better or it gets worse. Your weakness affects your ability to: Think clearly. Do your normal daily activities. Get help right away if you: Have sudden weakness on one side of your face or body. Have chest pain. Have trouble breathing or shortness of breath. Have problems with your vision. Have trouble talking or swallowing. Have trouble standing or walking. Are light-headed. Pass out (lose consciousness). Summary Weakness is a lack of strength. You may feel weak all over your body or just in one part of your body. There are many potential causes of weakness. Sometimes, the cause of your weakness may not be known. Rest as needed, and try to get enough sleep. Most adults need 7-8 hours of sleep each night. Eat a healthy,  well-balanced diet. This information is not intended to replace advice given to you by your health care provider. Make sure you discuss any questions you have with your healthcare provider. Document Revised: 11/14/2017 Document Reviewed: 11/14/2017 Elsevier Patient Education  2022 Elsevier Inc.  

## 2021-07-29 NOTE — Telephone Encounter (Signed)
I have completed FL2, in front office for them to pick up. Daughter or boyfriend needs to reach out to Lawerance Sabal at [facility] to discuss getting her placed for rehab. They need to try to do this ASAP. ?

## 2021-07-29 NOTE — Telephone Encounter (Signed)
Will fill out FL2 form and reach out to [facility]. ?

## 2021-07-29 NOTE — Telephone Encounter (Signed)
[name] advised.  ? ?Thanks,  ? ?-Mickel Baas  ?

## 2021-08-03 ENCOUNTER — Other Ambulatory Visit: Payer: Self-pay | Admitting: Interventional Radiology

## 2021-08-04 ENCOUNTER — Other Ambulatory Visit: Payer: Self-pay | Admitting: Internal Medicine

## 2021-08-04 NOTE — Telephone Encounter (Signed)
Requested Prescriptions  ?Pending Prescriptions Disp Refills  ?? FLUoxetine (PROZAC) 20 MG capsule [Pharmacy Med Name: FLUOXETINE HYDROCHLORIDE 20 MG Capsule] 270 capsule 0  ?  Sig: TAKE 3 CAPSULES EVERY MORNING  ?  ? Psychiatry:  Antidepressants - SSRI Passed - 08/04/2021  3:18 AM  ?  ?  Passed - Completed PHQ-2 or PHQ-9 in the last 360 days  ?  ?  Passed - Valid encounter within last 6 months  ?  Recent Outpatient Visits   ?      ? 1 month ago Epigastric pain  ? [facility] [place], [place] W, NP  ? 6 months ago Aortic atherosclerosis [facility])  ? [facility] [place], [place] W, NP  ?  ?  ? ?  ?  ?  ?? busPIRone (BUSPAR) 15 MG tablet [Pharmacy Med Name: BUSPIRONE HYDROCHLORIDE 15 MG Tablet] 180 tablet 0  ?  Sig: TAKE 1 TABLET TWICE DAILY  ?  ? Psychiatry: Anxiolytics/Hypnotics - Non-controlled Passed - 08/04/2021  3:18 AM  ?  ?  Passed - Valid encounter within last 12 months  ?  Recent Outpatient Visits   ?      ? 1 month ago Epigastric pain  ? [facility] [place], [place] W, NP  ? 6 months ago Aortic atherosclerosis [facility])  ? [facility] [place], Coralie Keens, NP  ?  ?  ? ?  ?  ?  ? ?

## 2021-08-15 ENCOUNTER — Encounter: Payer: Self-pay | Admitting: Internal Medicine

## 2021-08-24 ENCOUNTER — Ambulatory Visit
Admission: RE | Admit: 2021-08-24 | Discharge: 2021-08-24 | Disposition: A | Discharge status: Home / Self Care | Payer: Medicare PPO | Source: Ambulatory Visit | Attending: Radiology | Admitting: Radiology

## 2021-08-24 DIAGNOSIS — Z86711 Personal history of pulmonary embolism: Secondary | ICD-10-CM

## 2021-08-29 ENCOUNTER — Ambulatory Visit: Admission: RE | Admit: 2021-08-29 | Payer: Medicare PPO | Source: Home / Self Care | Admitting: Gastroenterology

## 2021-08-29 ENCOUNTER — Encounter: Admission: RE | Payer: Self-pay | Source: Home / Self Care

## 2021-08-29 SURGERY — ESOPHAGOGASTRODUODENOSCOPY (EGD) WITH PROPOFOL
Anesthesia: General

## 2021-09-20 ENCOUNTER — Telehealth: Payer: Self-pay

## 2021-09-20 NOTE — Telephone Encounter (Signed)
Copied from [place] [id number]. Topic: General - Other >> Sep 20, 2021 11:34 AM Erick Blinks wrote: Reason for CRM: Pt's husband called to request for the home health aid to draw blood for lab work for the patient. He says last time the home health aid did not bring their lab equipment

## 2021-09-20 NOTE — Telephone Encounter (Signed)
I will bring what I need tomorrow

## 2021-09-21 ENCOUNTER — Other Ambulatory Visit: Payer: Medicare PPO | Admitting: Internal Medicine

## 2021-09-21 DIAGNOSIS — B001 Herpesviral vesicular dermatitis: Secondary | ICD-10-CM

## 2021-09-21 DIAGNOSIS — M159 Polyosteoarthritis, unspecified: Secondary | ICD-10-CM

## 2021-09-21 DIAGNOSIS — I10 Essential (primary) hypertension: Secondary | ICD-10-CM | POA: Diagnosis not present

## 2021-09-21 DIAGNOSIS — E663 Overweight: Secondary | ICD-10-CM

## 2021-09-21 DIAGNOSIS — G629 Polyneuropathy, unspecified: Secondary | ICD-10-CM

## 2021-09-21 DIAGNOSIS — F32A Depression, unspecified: Secondary | ICD-10-CM

## 2021-09-21 DIAGNOSIS — E7800 Pure hypercholesterolemia, unspecified: Secondary | ICD-10-CM

## 2021-09-21 DIAGNOSIS — I951 Orthostatic hypotension: Secondary | ICD-10-CM | POA: Diagnosis not present

## 2021-09-21 DIAGNOSIS — R5381 Other malaise: Secondary | ICD-10-CM

## 2021-09-21 DIAGNOSIS — M810 Age-related osteoporosis without current pathological fracture: Secondary | ICD-10-CM

## 2021-09-21 DIAGNOSIS — Z6826 Body mass index (BMI) 26.0-26.9, adult: Secondary | ICD-10-CM

## 2021-09-21 DIAGNOSIS — D508 Other iron deficiency anemias: Secondary | ICD-10-CM

## 2021-09-21 DIAGNOSIS — K219 Gastro-esophageal reflux disease without esophagitis: Secondary | ICD-10-CM

## 2021-09-21 DIAGNOSIS — I700 Atherosclerosis of aorta: Secondary | ICD-10-CM | POA: Diagnosis not present

## 2021-09-21 DIAGNOSIS — I208 Other forms of angina pectoris: Secondary | ICD-10-CM

## 2021-09-21 DIAGNOSIS — G4733 Obstructive sleep apnea (adult) (pediatric): Secondary | ICD-10-CM

## 2021-09-21 DIAGNOSIS — N1831 Chronic kidney disease, stage 3a: Secondary | ICD-10-CM

## 2021-09-21 DIAGNOSIS — F419 Anxiety disorder, unspecified: Secondary | ICD-10-CM

## 2021-09-21 DIAGNOSIS — Z86711 Personal history of pulmonary embolism: Secondary | ICD-10-CM

## 2021-09-21 NOTE — Progress Notes (Unsigned)
Subjective:    Patient ID: Ann Russell, female    DOB: 01/10/1941, 81 y.o.   MRN: [id number]  HPI  Patient seen at home for follow-up.  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  She is taking midodrine as prescribed.  ECG from 06/2021 reviewed.  HLD with Aortic Atherosclerosis: Her last LDL was 66, triglycerides 87, 06/2020.  She is not taking any cholesterol-lowering medication at this time.  She consumes a low-fat diet.  OSA: She averages hours of sleep per night without the use of her CPAP.  There is no sleep study on file.  OA: Mainly in her.  She takes Tylenol as needed with good relief of symptoms.  She does not follow with orthopedics.  CKD: Her last creatinine was 1.48, GFR 36, 06/2021.  She is not currently on an ACEI/ARB secondary to orthostasis.  She does not follow with nephrology.  Neuropathy: She is taking Pregabalin as prescribed.  She does not follow with neurology.  Osteoporosis: She is taking Calcitrol and Prolia as prescribed.  She is currently nonweightbearing.  She has not had a bone density exam in >5 years  Anxiety and Depression: Chronic, managed on Fluoxetine.  She is not currently seeing a therapist.  She denies SI/HI.  History of PE: Remote history.  She is not currently taking any anticoagulation.  GERD: Triggered by.  She denies breakthrough on Omeprazole.  Upper GI from 06/2021 reviewed.  Anemia: Her last H/H was 11/35.4, 06/2021.  She is taking oral Iron as prescribed.  She does not follow with hematology.  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  Takes Vasotec for kidneys.-stage 3   Depression    Hyperlipemia    Hypertension    h/o had gastric bypass and was able to come off meds   Neuropathy    Pulmonary embolism ([facility])    Sleep apnea    no cpap since her gastric bypass    Current Outpatient Medications  Medication  Sig Dispense Refill   acetaminophen (TYLENOL) 650 MG CR tablet Take 650 mg by mouth 2 (two) times a day.     B COMPLEX VITAMINS SL Place 1 tablet under the tongue daily.     busPIRone (BUSPAR) 15 MG tablet TAKE 1 TABLET TWICE DAILY 180 tablet 0   calcitRIOL (ROCALTROL) 0.25 MCG capsule Take 1 capsule (0.25 mcg total) by mouth daily. 90 capsule 1   Calcium Carb-Cholecalciferol (CALCIUM 600 + D PO) Take 1 tablet by mouth daily.     denosumab (PROLIA) 60 MG/ML SOSY injection Inject 60 mg into the skin every 6 (six) months.     ferrous [id number] C-folic acid (TRINSICON / FOLTRIN) capsule Take 1 capsule by mouth 2 (two) times daily. 20 capsule    FLUoxetine (PROZAC) 20 MG capsule TAKE 3 CAPSULES EVERY MORNING 270 capsule 0   midodrine (PROAMATINE) 5 MG tablet Take 1 tablet (5 mg total) by mouth 3 (three) times daily with meals. 90 tablet 1   Multiple Vitamin (MULTI-VITAMINS) TABS Take 2 tablets by mouth 2 (two) times daily.     omeprazole (PRILOSEC) 20 MG capsule Take 1 capsule (20 mg total) by mouth 2 (two) times daily before a meal. 60 capsule 2   pregabalin (LYRICA) 100 MG capsule Take 1 capsule (100 mg  total) by mouth 3 (three) times daily. 270 capsule 0   Pyridoxine HCl (VITAMIN B6 PO) Take 1 tablet by mouth daily. With b12     valACYclovir (VALTREX) 1000 MG tablet Take 1 tablet (1,000 mg total) by mouth daily as needed (fever blisters). 30 tablet 2   No current facility-administered medications for this visit.    No Known Allergies  Family History  Problem Relation Age of Onset   Heart disease Mother    Dementia Mother    Neuropathy Father    Diabetes Brother    Breast cancer Neg Hx     Social History   Socioeconomic History   Marital status: Significant Other    Spouse name: Not on file   Number of children: 1   Years of education: Not on file   Highest education level: Not on file  Occupational History   Occupation: retired  Tobacco Use   Smoking status: Never    Smokeless tobacco: Never  Vaping Use   Vaping Use: Never used  Substance and Sexual Activity   Alcohol use: No   Drug use: No   Sexual activity: Not Currently  Other Topics Concern   Not on file  Social History Narrative   Not on file   Social Determinants of Health   Financial Resource Strain: Not on file  Food Insecurity: Not on file  Transportation Needs: Not on file  Physical Activity: Not on file  Stress: Not on file  Social Connections: Not on file  Intimate Partner Violence: Not on file     Constitutional: Denies fever, malaise, fatigue, headache or abrupt weight changes.  HEENT: Denies eye pain, eye redness, ear pain, ringing in the ears, wax buildup, runny nose, nasal congestion, bloody nose, or sore throat. Respiratory: Denies difficulty breathing, shortness of breath, cough or sputum production.   Cardiovascular: Denies chest pain, chest tightness, palpitations or swelling in the hands or feet.  Gastrointestinal: Denies abdominal pain, bloating, constipation, diarrhea or blood in the stool.  GU: Denies urgency, frequency, pain with urination, burning sensation, blood in urine, odor or discharge. Musculoskeletal: Patient reports intermittent joint pain.  Denies decrease in range of motion, difficulty with gait, muscle pain or joint swelling.  Skin: Denies redness, rashes, lesions or ulcercations.  Neurological: Patient reports neuropathy.  Denies dizziness, difficulty with memory, difficulty with speech or problems with balance and coordination.  Psych: Patient has a history of anxiety depression.  Denies SI/HI.  No other specific complaints in a complete review of systems (except as listed in HPI above).  Objective:   Physical Exam   There were no vitals taken for this visit. Wt Readings from Last 3 Encounters:  07/18/21 164 lb 7.4 oz (74.6 kg)  06/26/21 164 lb 7.4 oz (74.6 kg)  06/20/21 171 lb 15.3 oz (78 kg)    General: Appears their stated age, well  developed, well nourished in NAD. Skin: Warm, dry and intact. No rashes, lesions or ulcerations noted. HEENT: Head: normal shape and size; Eyes: sclera white, no icterus, conjunctiva pink, PERRLA and EOMs intact; Ears: Tm's gray and intact, normal light reflex; Nose: mucosa pink and moist, septum midline; Throat/Mouth: Teeth present, mucosa pink and moist, no exudate, lesions or ulcerations noted.  Neck:  Neck supple, trachea midline. No masses, lumps or thyromegaly present.  Cardiovascular: Normal rate and rhythm. S1,S2 noted.  No murmur, rubs or gallops noted. No JVD or BLE edema. No carotid bruits noted. Pulmonary/Chest: Normal effort and positive vesicular breath sounds.  No respiratory distress. No wheezes, rales or ronchi noted.  Abdomen: Soft and nontender. Normal bowel sounds. No distention or masses noted. Liver, spleen and kidneys non palpable. Musculoskeletal: Normal range of motion. No signs of joint swelling. No difficulty with gait.  Neurological: Alert and oriented. Cranial nerves II-XII grossly intact. Coordination normal.  Psychiatric: Mood and affect normal. Behavior is normal. Judgment and thought content normal.    BMET    Component Value Date/Time   NA 135 07/18/2021 1536   K 4.4 07/18/2021 1536   CL 101 07/18/2021 1536   CO2 26 07/18/2021 1536   GLUCOSE 114 (H) 07/18/2021 1536   BUN 37 (H) 07/18/2021 1536   CREATININE 1.48 (H) 07/18/2021 1536   CREATININE 1.58 (H) 06/23/2021 1550   CALCIUM 10.4 (H) 07/18/2021 1536   GFRNONAA 36 (L) 07/18/2021 1536   GFRAA 30 (L) 09/20/2019 1418    Lipid Panel  No results found for: CHOL, TRIG, HDL, CHOLHDL, VLDL, LDLCALC  CBC    Component Value Date/Time   WBC 5.7 07/18/2021 1536   RBC 3.55 (L) 07/18/2021 1536   HGB 11.0 (L) 07/18/2021 1536   HCT 35.4 (L) 07/18/2021 1536   PLT 141 (L) 07/18/2021 1536   MCV 99.7 07/18/2021 1536   MCH 31.0 07/18/2021 1536   MCHC 31.1 07/18/2021 1536   RDW 18.9 (H) 07/18/2021 1536    LYMPHSABS 2.1 06/23/2021 1714   MONOABS 0.5 06/23/2021 1714   EOSABS 0.1 06/23/2021 1714   BASOSABS 0.0 06/23/2021 1714    Hgb A1C No results found for: HGBA1C         Assessment & Plan:     Webb Silversmith, NP

## 2021-09-22 ENCOUNTER — Other Ambulatory Visit: Payer: Self-pay | Admitting: Internal Medicine

## 2021-09-22 ENCOUNTER — Encounter: Payer: Self-pay | Admitting: Internal Medicine

## 2021-09-22 DIAGNOSIS — E785 Hyperlipidemia, unspecified: Secondary | ICD-10-CM | POA: Insufficient documentation

## 2021-09-22 DIAGNOSIS — R5381 Other malaise: Secondary | ICD-10-CM | POA: Insufficient documentation

## 2021-09-22 DIAGNOSIS — D649 Anemia, unspecified: Secondary | ICD-10-CM | POA: Insufficient documentation

## 2021-09-22 DIAGNOSIS — G4733 Obstructive sleep apnea (adult) (pediatric): Secondary | ICD-10-CM | POA: Insufficient documentation

## 2021-09-22 DIAGNOSIS — I951 Orthostatic hypotension: Secondary | ICD-10-CM | POA: Insufficient documentation

## 2021-09-22 DIAGNOSIS — K219 Gastro-esophageal reflux disease without esophagitis: Secondary | ICD-10-CM | POA: Insufficient documentation

## 2021-09-22 MED ORDER — OMEPRAZOLE 40 MG PO CPDR
40.0000 mg | DELAYED_RELEASE_CAPSULE | Freq: Every day | ORAL | 0 refills | Status: DC
Start: 2021-09-22 — End: 2021-12-23

## 2021-09-22 MED ORDER — MIDODRINE HCL 5 MG PO TABS: 5.0000 mg | ORAL_TABLET | Freq: Two times a day (BID) | ORAL | 0 refills | Status: AC

## 2021-09-22 NOTE — Assessment & Plan Note (Signed)
Continue Valacyclovir as needed

## 2021-09-22 NOTE — Patient Instructions (Signed)
Weakness Weakness is a lack of strength. You may feel weak all over your body (generalized), or you may feel weak in one part of your body (focal). There are many potential causes of weakness. Sometimes, the cause of your weakness may not be known. Some causes of weakness can be serious, so it is important to see your doctor. Follow these instructions at home: Activity Rest as needed. Try to get enough sleep. Most adults need 7-8 hours of sleep each night. Talk to your doctor about how much sleep you need. Do exercises, such as arm curls and leg raises, for 30 minutes at least 2 days a week or as told by your doctor. Think about working with a physical therapist or trainer to help you get stronger. General instructions  Take over-the-counter and prescription medicines only as told by your doctor. Eat a healthy, well-balanced diet. This includes: Proteins to build muscles, such as lean meats and fish. Fresh fruits and vegetables. Carbohydrates to boost energy, such as whole grains. Drink enough fluid to keep your pee (urine) pale yellow. Keep all follow-up visits. Contact a doctor if: Your weakness does not get better or it gets worse. Your weakness affects your ability to: Think clearly. Do your normal daily activities. Get help right away if: You have sudden weakness on one side of your face or body. You have chest pain. You have trouble breathing or shortness of breath. You have problems with how you see (vision). You have trouble talking or swallowing. You have trouble standing or walking. You are light-headed or faint. These symptoms may be an emergency. Get help right away. Call 911. Do not wait to see if the symptoms will go away. Do not drive yourself to the hospital. Summary Weakness is a lack of strength. You may feel weak all over your body or just in one part of your body. There are many potential causes of weakness. Sometimes, the cause of your weakness may not be  known. Rest as needed, and try to get enough sleep. Most adults need 7-8 hours of sleep each night. Eat a healthy, well-balanced diet. This information is not intended to replace advice given to you by your health care provider. Make sure you discuss any questions you have with your health care provider. Document Revised: 03/13/2021 Document Reviewed: 03/13/2021 Elsevier Patient Education  2023 [employer]  

## 2021-09-22 NOTE — Assessment & Plan Note (Signed)
We will continue Calcitrol and Prolia given her fall risk

## 2021-09-22 NOTE — Assessment & Plan Note (Signed)
Has been elevated recently We will decrease Midodrine to 5 mg twice daily

## 2021-09-22 NOTE — Assessment & Plan Note (Signed)
Debilitating She declines home health PT She declines SNF for rehab

## 2021-09-22 NOTE — Assessment & Plan Note (Signed)
CBC today Continue oral Iron

## 2021-09-22 NOTE — Assessment & Plan Note (Signed)
C-Met and lipid profile today Encouraged her to consume a low-fat diet Not on statin therapy given age and prognosis

## 2021-09-22 NOTE — Assessment & Plan Note (Signed)
Currently not an issue °We will monitor °

## 2021-09-22 NOTE — Assessment & Plan Note (Signed)
Not wearing CPAP We will monitor

## 2021-09-22 NOTE — Assessment & Plan Note (Signed)
No recent chest pains Not on statin given age and prognosis Not on anticoagulation secondary to fall risk

## 2021-09-22 NOTE — Assessment & Plan Note (Signed)
Continue Tylenol as needed 

## 2021-09-22 NOTE — Assessment & Plan Note (Signed)
Debilitating She has refused SNF for rehab Continue Pregabalin

## 2021-09-22 NOTE — Assessment & Plan Note (Signed)
C-Met and lipid profile today Encouraged her to consume a low-fat diet Not on statin therapy given age and prognosis Not on anticoagulation given her fall risk

## 2021-09-22 NOTE — Assessment & Plan Note (Signed)
Encourage diet for weight loss 

## 2021-09-22 NOTE — Assessment & Plan Note (Signed)
C-Met today Not currently on ACEI/ARB given hypotension

## 2021-09-22 NOTE — Assessment & Plan Note (Signed)
We will increase Omeprazole to 40 mg twice daily

## 2021-09-22 NOTE — Assessment & Plan Note (Signed)
Stable on her current dose of Fluoxetine Support offered

## 2021-09-23 LAB — COMPLETE METABOLIC PANEL WITH GFR
AG Ratio: 1.4 (calc) (ref 1.0–2.5)
ALT: 10 U/L (ref 6–29)
AST: 20 U/L (ref 10–35)
Albumin: 4 g/dL (ref 3.6–5.1)
Alkaline phosphatase (APISO): 78 U/L (ref 37–153)
BUN/Creatinine Ratio: 17 (calc) (ref 6–22)
BUN: 23 mg/dL (ref 7–25)
CO2: 25 mmol/L (ref 20–32)
Calcium: 10.7 mg/dL — ABNORMAL HIGH (ref 8.6–10.4)
Chloride: 103 mmol/L (ref 98–110)
Creat: 1.33 mg/dL — ABNORMAL HIGH (ref 0.60–0.95)
Globulin: 2.8 g/dL (calc) (ref 1.9–3.7)
Glucose, Bld: 86 mg/dL (ref 65–139)
Potassium: 3.7 mmol/L (ref 3.5–5.3)
Sodium: 140 mmol/L (ref 135–146)
Total Bilirubin: 0.4 mg/dL (ref 0.2–1.2)
Total Protein: 6.8 g/dL (ref 6.1–8.1)
eGFR: 40 mL/min/{1.73_m2} — ABNORMAL LOW (ref >=60)

## 2021-09-23 LAB — CBC WITH DIFFERENTIAL/PLATELET
Absolute Monocytes: 407 cells/uL (ref 200–950)
Basophils Absolute: 41 cells/uL (ref 0–200)
Basophils Relative: 0.6 %
Eosinophils Absolute: 21 cells/uL (ref 15–500)
Eosinophils Relative: 0.3 %
HCT: 43 % (ref 35.0–45.0)
Hemoglobin: 13.7 g/dL (ref 11.7–15.5)
Lymphs Abs: 1842 cells/uL (ref 850–3900)
MCH: 30.6 pg (ref 27.0–33.0)
MCHC: 31.9 g/dL — ABNORMAL LOW (ref 32.0–36.0)
MCV: 96.2 fL (ref 80.0–100.0)
MPV: 11.4 fL (ref 7.5–12.5)
Monocytes Relative: 5.9 %
Neutro Abs: 4589 cells/uL (ref 1500–7800)
Neutrophils Relative %: 66.5 %
Platelets: 264 10*3/uL (ref 140–400)
RBC: 4.47 10*6/uL (ref 3.80–5.10)
RDW: 15.5 % — ABNORMAL HIGH (ref 11.0–15.0)
Total Lymphocyte: 26.7 %
WBC: 6.9 10*3/uL (ref 3.8–10.8)

## 2021-09-23 LAB — LIPID PANEL
Cholesterol: 195 mg/dL (ref <200)
HDL: 89 mg/dL (ref >=50)
LDL Cholesterol (Calc): 87 mg/dL (calc)
Non-HDL Cholesterol (Calc): 106 mg/dL (calc) (ref <130)
Total CHOL/HDL Ratio: 2.2 (calc) (ref <5.0)
Triglycerides: 99 mg/dL (ref <150)

## 2021-09-26 ENCOUNTER — Telehealth: Payer: Self-pay

## 2021-09-26 NOTE — Telephone Encounter (Signed)
Left a message for patient to call back and schedule their Medicare Annual Wellness Visit (AWV) virtually, telephone or face to face. ?  ?Ann Russell, CMA ?[phone]  ?

## 2021-09-29 ENCOUNTER — Ambulatory Visit (INDEPENDENT_AMBULATORY_CARE_PROVIDER_SITE_OTHER): Payer: Medicare PPO

## 2021-09-29 DIAGNOSIS — Z0000 Encounter for general adult medical examination without abnormal findings: Secondary | ICD-10-CM

## 2021-09-29 NOTE — Progress Notes (Signed)
Subjective:  I connected with  Ann Russell on 09/29/21 by a audio enabled telemedicine application and verified that I am speaking with the correct person using two identifiers.  Patient Location: Home  Provider Location: Office/Clinic  I discussed the limitations of evaluation and management by telemedicine. The patient expressed understanding and agreed to proceed.     Ann Russell is a 81 y.o. female who presents for Medicare Annual (Subsequent) preventive examination.  Review of Systems    Per HPI unless specifically indicated below       Objective:    There were no vitals filed for this visit. There is no height or weight on file to calculate BMI.     07/18/2021    2:21 PM 07/03/2021    7:16 PM 06/29/2021   10:49 AM 06/26/2021    8:07 PM 06/25/2021   10:25 PM 06/20/2021   10:00 PM 06/20/2021    9:47 PM  Advanced Directives  Does Patient Have a Medical Advance Directive? No  No Yes No Yes No  Type of Theatre stage manager of [place];Living will  [place];Living will   Does patient want to make changes to medical advance directive?    No - Patient declined     Would patient like information on creating a medical advance directive?  No - Patient declined         Current Medications (verified) Outpatient Encounter Medications as of 09/29/2021  Medication Sig   acetaminophen (TYLENOL) 650 MG CR tablet Take 650 mg by mouth 2 (two) times a day.   B COMPLEX VITAMINS SL Place 1 tablet under the tongue daily.   busPIRone (BUSPAR) 15 MG tablet TAKE 1 TABLET TWICE DAILY   calcitRIOL (ROCALTROL) 0.25 MCG capsule Take 1 capsule (0.25 mcg total) by mouth daily.   Calcium Carb-Cholecalciferol (CALCIUM 600 + D PO) Take 1 tablet by mouth daily.   denosumab (PROLIA) 60 MG/ML SOSY injection Inject 60 mg into the skin every 6 (six) months.   ferrous [id number] C-folic acid (TRINSICON / FOLTRIN) capsule Take 1 capsule by mouth 2 (two) times  daily.   FLUoxetine (PROZAC) 20 MG capsule TAKE 3 CAPSULES EVERY MORNING   midodrine (PROAMATINE) 5 MG tablet Take 1 tablet (5 mg total) by mouth 2 (two) times daily with a meal.   Multiple Vitamin (MULTI-VITAMINS) TABS Take 2 tablets by mouth 2 (two) times daily.   omeprazole (PRILOSEC) 40 MG capsule Take 1 capsule (40 mg total) by mouth daily.   pregabalin (LYRICA) 100 MG capsule Take 1 capsule (100 mg total) by mouth 3 (three) times daily.   Pyridoxine HCl (VITAMIN B6 PO) Take 1 tablet by mouth daily. With b12   valACYclovir (VALTREX) 1000 MG tablet Take 1 tablet (1,000 mg total) by mouth daily as needed (fever blisters).   No facility-administered encounter medications on file as of 09/29/2021.    Allergies (verified) Patient has no known allergies.   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   Depression    Hyperlipemia    Hypertension    h/o had gastric bypass and was able to come off meds   Neuropathy    Pulmonary embolism (HCC)    Sleep apnea    no cpap since  her gastric bypass   Past Surgical History:  Procedure Laterality Date   ABDOMINAL HYSTERECTOMY     Buniectomy     bil   CHOLECYSTECTOMY     ESOPHAGOGASTRODUODENOSCOPY (EGD) WITH PROPOFOL N/A 06/29/2021   Procedure: ESOPHAGOGASTRODUODENOSCOPY (EGD) WITH PROPOFOL;  Surgeon: [name], MD;  [facility] END[facility]COPY;  Service: Gastroenterology;  Laterality: N/A;   FACIAL COSMETIC SURGERY  2011   GASTRIC BYPASS  2010   IR IVC FILTER PLMT / S&I /IMG GUID/MOD SED  07/01/2021   PULMONARY THROMBECTOMY Bilateral 06/22/2017   Procedure: PULMONARY THROMBECTOMY;  Surgeon: Algernon Huxley, MD;  Location: [facility];  Service: Cardiovascular;  Laterality: Bilateral;   Rotor cuff     right   SCLERAL BUCKLE  11/07/2011   Procedure: SCLERAL BUCKLE;  Surgeon: Hayden Pedro,  MD;  Location: [place];  Service: Ophthalmology;  Laterality: Left;   TOTAL KNEE ARTHROPLASTY Right 12/03/2018   Procedure: RIGHT TOTAL KNEE ARTHROPLASTY;  Surgeon: Hessie Knows, MD;  Location: [facility] ORS;  Service: Orthopedics;  Laterality: Right;   Family History  Problem Relation Age of Onset   Heart disease Mother    Dementia Mother    Neuropathy Father    Diabetes Brother    Breast cancer Neg Hx    Social History   Socioeconomic History   Marital status: Significant Other    Spouse name: Not on file   Number of children: 1   Years of education: Not on file   Highest education level: Not on file  Occupational History   Occupation: retired  Tobacco Use   Smoking status: Never   Smokeless tobacco: Never  Vaping Use   Vaping Use: Never used  Substance and Sexual Activity   Alcohol use: No   Drug use: No   Sexual activity: Not Currently  Other Topics Concern   Not on file  Social History Narrative   Not on file   Social Determinants of Health   Financial Resource Strain: Low Risk  (09/29/2021)   Overall Financial Resource Strain (CARDIA)    Difficulty of Paying Living Expenses: Not hard at all  Food Insecurity: No Food Insecurity (09/29/2021)   Hunger Vital Sign    Worried About Running Out of Food in the Last Year: Never true    [place] in the Last Year: Never true  Transportation Needs: No Transportation Needs (09/29/2021)   PRAPARE - Hydrologist (Medical): No    Lack of Transportation (Non-Medical): No  Physical Activity: Inactive (09/29/2021)   Exercise Vital Sign    Days of Exercise per Week: 0 days    Minutes of Exercise per Session: 0 min  Stress: No Stress Concern Present (09/29/2021)   [place]    Feeling of Stress : Not at all  Social Connections: Moderately Isolated (09/29/2021)   Social Connection and Isolation Panel [NHANES]    Frequency of Communication with  Friends and Family: More than three times a week    Frequency of Social Gatherings with Friends and Family: More than three times a week    Attends Religious Services: Never    Marine scientist or Organizations: No    Attends Archivist Meetings: Never    Marital Status: Living with partner    Tobacco Counseling Counseling given: Not Answered   Clinical Intake:  Pre-visit preparation completed: No  Pain : No/denies pain  Nutritional Status: BMI > 30  Obese Nutritional Risks: Nausea/ vomitting/ diarrhea  How often do you need to have someone help you when you read instructions, pamphlets, or other written materials from your doctor or pharmacy?: 1 - Never  Diabetic?No   Interpreter Needed?: No  Information entered by :: Donnie Mesa, CMA   Activities of Daily Living    09/29/2021    1:55 PM 06/26/2021    8:07 PM  In your present state of health, do you have any difficulty performing the following activities:  Hearing? 0 0  Vision? 1 0  Difficulty concentrating or making decisions? 0 1  Walking or climbing stairs? 1 1  Dressing or bathing? 0 1  Doing errands, shopping? 0 1    Patient Care Team: Jearld Fenton, NP as PCP - General (Internal Medicine) Lovell Sheehan, MD as Consulting Physician (Orthopedic Surgery)  Indicate any recent Medical Services you may have received from other than [facility] providers in the past year (date may be approximate).    No hospitalization in the past 12 months.  Assessment:   This is a routine wellness examination for [place].  Hearing/Vision screen No results found.  Dietary issues and exercise activities discussed:     Goals Addressed   None    Depression Screen    09/29/2021    1:53 PM 02/03/2021    3:05 PM  PHQ 2/9 Scores  PHQ - 2 Score 0 0    Fall Risk    09/29/2021    1:56 PM  [place] in the past year? 1  Number falls in past yr: 0  Injury with Fall? 0  Risk for fall due to :  Impaired mobility;Impaired balance/gait  Follow up Falls evaluation completed    FALL RISK PREVENTION PERTAINING TO THE HOME:  Any stairs in or around the home? No  If so, are there any without handrails? No  Home free of loose throw rugs in walkways, pet beds, electrical cords, etc? Yes  Adequate lighting in your home to reduce risk of falls? Yes   ASSISTIVE DEVICES UTILIZED TO PREVENT FALLS:  Life alert? No  Use of a cane, walker or w/c? Yes  Grab bars in the bathroom? Yes  Shower chair or bench in shower? Yes  Elevated toilet seat or a handicapped toilet? Yes   The patient state that she is basically bed bound. Boyfriend assists with transportation and other ADL's.   Cognitive Function:        09/29/2021    1:58 PM  6CIT Screen  What Year? 0 points  What month? 0 points  What time? 0 points  Count back from 20 0 points  Months in reverse 2 points  Repeat phrase 10 points  Total Score 12 points    Immunizations Immunization History  Administered Date(s) Administered   Fluad Quad(high Dose 65+) 02/03/2021   Influenza Inj Mdck Quad Pf 02/21/2018   Influenza, High Dose Seasonal PF 05/25/2016   Influenza,inj,Quad PF,6+ Mos 01/23/2020   Influenza-Unspecified 02/07/2017   PFIZER Comirnaty(Gray Top)Covid-19 Tri-Sucrose Vaccine 07/23/2019, 08/06/2019    TDAP status: Due, Education has been provided regarding the importance of this vaccine. Advised may receive this vaccine at local pharmacy or Health Dept. Aware to provide a copy of the vaccination record if obtained from local pharmacy or Health Dept. Verbalized acceptance and understanding.  Flu Vaccine status: Up to date  Pneumococcal vaccine status: Due, Education has been provided regarding the importance of  this vaccine. Advised may receive this vaccine at local pharmacy or Health Dept. Aware to provide a copy of the vaccination record if obtained from local pharmacy or Health Dept. Verbalized acceptance and  understanding.  Covid-19 vaccine status: Completed vaccines  Qualifies for Shingles Vaccine? Yes   Zostavax completed No   Shingrix Completed?: No.    Education has been provided regarding the importance of this vaccine. Patient has been advised to call insurance company to determine out of pocket expense if they have not yet received this vaccine. Advised may also receive vaccine at local pharmacy or Health Dept. Verbalized acceptance and understanding.  Screening Tests Health Maintenance  Topic Date Due   TETANUS/TDAP  Never done   Zoster Vaccines- Shingrix (1 of 2) Never done   Pneumonia Vaccine 2+ Years old (1 - PCV) Never done   COVID-19 Vaccine (3 - Pfizer risk series) 09/03/2019   INFLUENZA VACCINE  11/22/2021   DEXA SCAN  Completed   HPV VACCINES  Aged Out    Health Maintenance  Health Maintenance Due  Topic Date Due   TETANUS/TDAP  Never done   Zoster Vaccines- Shingrix (1 of 2) Never done   Pneumonia Vaccine 22+ Years old (1 - PCV) Never done   COVID-19 Vaccine (3 - Pfizer risk series) 09/03/2019    Colorectal cancer screening: No longer required.  Last Colonoscopy: 07/21/2008  Mammogram status: No longer required due to 09/15/2015.  DEXA scan: Completed 12/06/2020, due 12/06/2025  Lung Cancer Screening: (Low Dose CT Chest recommended if Age 65-80 years, 30 pack-year currently smoking OR have quit w/in 15years.) does not qualify.   Lung Cancer Screening Referral: does not qualify   Additional Screening:  Hepatitis C Screening: does not qualify; Completed  Vision Screening: Recommended annual ophthalmology exams r early detection of glaucoma and other disorders of the eye. Is the patient up to date with their annual eye exam?  No  Last Eye Exam: 04/28/2020  Who is the provider or what is the name of the office in which the patient attends annual eye exams?  If pt is not established with a provider, would they like to be referred to a provider to establish care? No  .   Dental Screening: Recommended annual dental exams for proper oral hygiene  Community Resource Referral / Chronic Care Management: CRR required this visit?  No   CCM required this visit?  No      Plan:     I have personally reviewed and noted the following in the patient's chart:   Medical and social history Use of alcohol, tobacco or illicit drugs  Current medications and supplements including opioid prescriptions.  Functional ability and status Nutritional status Physical activity Advanced directives List of other physicians Hospitalizations, surgeries, and ER visits in previous 12 months Vitals Screenings to include cognitive, depression, and falls Referrals and appointments  In addition, I have reviewed and discussed with patient certain preventive protocols, quality metrics, and best practice recommendations. A written personalized care plan for preventive services as well as general preventive health recommendations were provided to patient.     Wilson Singer, [place]   09/29/2021

## 2021-10-11 ENCOUNTER — Other Ambulatory Visit: Payer: Self-pay | Admitting: Internal Medicine

## 2021-10-11 NOTE — Telephone Encounter (Signed)
Requested medication (s) are due for refill today: no  Requested medication (s) are on the active medication list: yes  Last refill:  05/23/21 #90 1 RF  Future visit scheduled: no  Notes to clinic:  no results for Phos, PTH   Requested Prescriptions  Pending Prescriptions Disp Refills   calcitRIOL (ROCALTROL) 0.25 MCG capsule [Pharmacy Med Name: CALCITRIOL 0.25 MCG Capsule] 90 capsule 1    Sig: TAKE [place]     Endocrinology:  Vitamins - Vitamin D Supplementation - calcitriol Failed - 10/11/2021 12:53 AM      Failed - Phosphate in normal range and within 360 days    No results found for: "PHOS"       Failed - PTH in normal range and within 360 days    No results found for: "IOPTH", "PTHINTACTFNA", "PTH"       Failed - Ca in normal range and within 360 days    Calcium  Date Value Ref Range Status  09/22/2021 10.7 (H) 8.6 - 10.4 mg/dL Final         Passed - Valid encounter within last 12 months    Recent Outpatient Visits           3 months ago Epigastric pain   [facility] Jearld Fenton, NP   8 months ago Aortic atherosclerosis [facility])   [facility] [place], Coralie Keens, NP

## 2021-11-15 ENCOUNTER — Ambulatory Visit: Payer: Self-pay

## 2021-11-15 NOTE — Telephone Encounter (Signed)
   Chief Complaint: Abdominal pain that comes and goes. Has not been taking her Prilosec. Friend is trying to locate the medication in her supplies.Does not think she needs OV at this time. Will call back if unable to locate medication. Symptoms:  Frequency:  Pertinent Negatives: Patient denies  Disposition: '[]'$ ED /'[]'$ Urgent Care (no appt availability in office) / '[]'$ Appointment(In office/virtual)/ '[]'$  [place] [facility]/ '[]'$ Home Care/ '[]'$ Refused Recommended Disposition /'[]'$ [place] [facility]/ '[]'$  Follow-up with PCP Additional Notes:   Answer Assessment - Initial Assessment Questions 1. LOCATION: "Where does it hurt?"      Lower at [employer] 2. RADIATION: "Does the pain shoot anywhere else?" (e.g., chest, back)     No 3. ONSET: "When did the pain begin?" (e.g., minutes, hours or days ago)      2 months ago 4. SUDDEN: "Gradual or sudden onset?"     Gradual 5. PATTERN "Does the pain come and go, or is it constant?"    - If it comes and goes: "How long does it last?" "Do you have pain now?"     (Note: Comes and goes means the pain is intermittent. It goes away completely between bouts.)    - If constant: "Is it getting better, staying the same, or getting worse?"      (Note: Constant means the pain never goes away completely; most serious pain is constant and gets worse.)      Comes and goes 6. SEVERITY: "How bad is the pain?"  (e.g., Scale 1-10; mild, moderate, or severe)    - MILD (1-3): Doesn't interfere with normal activities, abdomen soft and not tender to touch.     - MODERATE (4-7): Interferes with normal activities or awakens from sleep, abdomen tender to touch.     - SEVERE (8-10): Excruciating pain, doubled over, unable to do any normal activities.       Mild 7. RECURRENT SYMPTOM: "Have you ever had this type of stomach pain before?" If Yes, ask: "When was the last time?" and "What happened that time?"      Yes - has an ulcer 8. CAUSE: "What do you think is causing the stomach  pain?"     Ulcer 9. RELIEVING/AGGRAVATING FACTORS: "What makes it better or worse?" (e.g., antacids, bending or twisting motion, bowel movement)     No 10. OTHER SYMPTOMS: "Do you have any other symptoms?" (e.g., back pain, diarrhea, fever, urination pain, vomiting)       No 11. PREGNANCY: "Is there any chance you are pregnant?" "When was your last menstrual period?"       No  Protocols used: Abdominal Pain - [facility]

## 2021-12-08 ENCOUNTER — Ambulatory Visit: Payer: Self-pay | Admitting: *Deleted

## 2021-12-08 NOTE — Telephone Encounter (Signed)
  Chief Complaint: nausea and not eating much and when she does eat the nausea is so bad it makes her not want to eat.   Symptoms: The last couple of days she has been so weak and feels like she is going to faint.   Mateo Flow thinks it could be a UTI.  Frequency: Last couple of days Pertinent Negatives: Patient denies  vomting Disposition: '[x]'$ ED /'[]'$ Urgent Care (no appt availability in office) / '[]'$ Appointment(In office/virtual)/ '[]'$  [place] [facility]/ '[]'$ Home Care/ '[]'$ Refused Recommended Disposition /'[]'$ [place] [facility]/ '[]'$  Follow-up with PCP Additional Notes: Suggested he call 911 and have her transported to the ED since she is weak and he can't help her ambulate.   He was agreeable  to this plan.

## 2021-12-08 NOTE — Telephone Encounter (Signed)
Mateo Flow care giver calling in.    She is having  time the last couple of days.   She is having nausea and like she is going to faint when she gets up from bed.   She is not eating because of the nausea.   I don't know what to do. Hasn't eaten anything today.    Reason for Disposition  [1] Drinking very little AND [2] dehydration suspected (e.g., no urine > 12 hours, very dry mouth, very lightheaded)  Answer Assessment - Initial Assessment Questions 1. NAUSEA SEVERITY: "How bad is the nausea?" (e.g., mild, moderate, severe; dehydration, weight loss)   - MILD: loss of appetite without change in eating habits   - MODERATE: decreased oral intake without significant weight loss, dehydration, or malnutrition   - SEVERE: inadequate caloric or fluid intake, significant weight loss, symptoms of dehydration     She is having nausea and is not eating anything.   She almost faints when she gets up from bed.   Going on the last couple of days.     She has mentioned having a UTI. 2. ONSET: "When did the nausea begin?"     It happens on and off daily.    Rollene Fare gave her medication to help with the nausea.    Getting out of the bed to go to the kitchen she is exhausted by time she gets there.   She puts her head down on the table.   She looks like she is about to faint.       3. VOMITING: "Any vomiting?" If Yes, ask: "How many times today?"     No vomiting but having dry heaves.    Yesterday she ate some things.    After eating she feels nauseas.   4. RECURRENT SYMPTOM: "Have you had nausea before?" If Yes, ask: "When was the last time?" "What happened that time?"     Yes 5. CAUSE: "What do you think is causing the nausea?"     UTI   6. PREGNANCY: "Is there any chance you are pregnant?" (e.g., unprotected intercourse, missed birth control pill, broken condom)     N/A  Protocols used: Nausea-A-AH

## 2021-12-09 NOTE — Telephone Encounter (Signed)
I see she did not go to the ER. Could you see if they want to do a virtual visit through [facility]?

## 2021-12-23 ENCOUNTER — Other Ambulatory Visit: Payer: Self-pay | Admitting: Internal Medicine

## 2021-12-23 NOTE — Telephone Encounter (Signed)
Requested Prescriptions  Pending Prescriptions Disp Refills  . busPIRone (BUSPAR) 15 MG tablet [Pharmacy Med Name: BUSPIRONE HYDROCHLORIDE 15 MG Tablet] 180 tablet 0    Sig: TAKE 1 TABLET TWICE DAILY     Psychiatry: Anxiolytics/Hypnotics - Non-controlled Passed - 12/23/2021  6:03 AM      Passed - Valid encounter within last 12 months    Recent Outpatient Visits          6 months ago Epigastric pain   [place] [facility] [place], Coralie Keens, NP   10 months ago Aortic atherosclerosis [facility])   [facility] [place], [place] W, NP             . FLUoxetine (PROZAC) 20 MG capsule [Pharmacy Med Name: FLUOXETINE HYDROCHLORIDE 20 MG Capsule] 270 capsule 0    Sig: TAKE 3 [place]     Psychiatry:  Antidepressants - SSRI Passed - 12/23/2021  6:03 AM      Passed - Completed PHQ-2 or PHQ-9 in the last 360 days      Passed - Valid encounter within last 6 months    Recent Outpatient Visits          6 months ago Epigastric pain   [facility] [place], Coralie Keens, NP   10 months ago Aortic atherosclerosis [facility])   [facility] [place], [place] W, NP             . omeprazole (PRILOSEC) 40 MG capsule [Pharmacy Med Name: OMEPRAZOLE 40 MG Capsule Delayed Release] 90 capsule     Sig: TAKE [place]     Gastroenterology: Proton Pump Inhibitors Passed - 12/23/2021  6:03 AM      Passed - Valid encounter within last 12 months    Recent Outpatient Visits          6 months ago Epigastric pain   [place], Coralie Keens, NP   10 months ago Aortic atherosclerosis [facility])   [facility] [place], Coralie Keens, NP

## 2022-01-04 ENCOUNTER — Ambulatory Visit: Payer: Self-pay | Admitting: *Deleted

## 2022-01-04 NOTE — Telephone Encounter (Signed)
  Chief Complaint: urinary retention, unable to urinate since 5:30 am this am  Symptoms: pain low abdomen. Bladder fullness and unable to urinate  Frequency: this am  Pertinent Negatives: Patient denies fever, no cool clammy skin no rapid pulse Disposition: '[x]'$ ED /'[]'$ Urgent Care (no appt availability in office) / '[]'$ Appointment(In office/virtual)/ '[]'$  [place] [facility]/ '[]'$ Home Care/ '[]'$ Refused Recommended Disposition /'[]'$ [place] Mobile [facility]/ '[]'$  Follow-up with PCP Additional Notes:   Recommended ED and patient refused . Reports she will need EMS to take her and she does not want to go through this. Requesting medication . Please advise.      Reason for Disposition  [1] Unable to urinate (or only a few drops) > 4 hours AND [2] bladder feels very full (e.g., palpable bladder or strong urge to urinate)  Answer Assessment - Initial Assessment Questions 1. SEVERITY: "How bad is the pain?"  (e.g., Scale 1-10; mild, moderate, or severe)   - MILD (1-3): complains slightly about urination hurting   - MODERATE (4-7): interferes with normal activities     - SEVERE (8-10): excruciating, unwilling or unable to urinate because of the pain      severe 2. FREQUENCY: "How many times have you had painful urination today?"      Able to urinate at 5:30 am and then can not urinate 3. PATTERN: "Is pain present every time you urinate or just sometimes?"      Unable to urinate 4. ONSET: "When did the painful urination start?"      na 5. FEVER: "Do you have a fever?" If Yes, ask: "What is your temperature, how was it measured, and when did it start?"     no 6. PAST UTI: "Have you had a urine infection before?" If Yes, ask: "When was the last time?" and "What happened that time?"      Yes hx stage III kidney failure  7. CAUSE: "What do you think is causing the painful urination?"  (e.g., UTI, scratch, Herpes sore)     na 8. OTHER SYMPTOMS: "Do you have any other symptoms?" (e.g., blood in urine, flank pain,  genital sores, urgency, vaginal discharge)     Low abdomen  9. PREGNANCY: "Is there any chance you are pregnant?" "When was your last menstrual period?"     na  Protocols used: Urination Pain - Female-A-AH

## 2022-01-04 NOTE — Telephone Encounter (Signed)
FYI...   I spoke with Ms. Blacksher she says she is able to urinate but is very painful.  I advised her that she would need to schedule an appointment with [place] or go to an Urgent Care.  Pt refused appointment stating she is home bound and is unable to get out of the house.  I suggested she call recuse she again refused saying "I already owe them a million dollars; I'm through with them."  She says she feels better this afternoon and will continue to drink extra water and cranberry juice.  She will call back if she feels worse.   Thanks,   -Mickel Baas

## 2022-01-05 ENCOUNTER — Telehealth: Payer: Self-pay | Admitting: Internal Medicine

## 2022-01-05 NOTE — Telephone Encounter (Signed)
Pt is calling to report that She is still having urinary discomfort. Would like to know if Rollene Fare can make a house call since she is home bound. [phone]

## 2022-01-05 NOTE — Telephone Encounter (Signed)
Pt advised.  She said thank you so much for offering but she is feeling a lot better today.  She says she will call back if her symptoms return.    Thanks,   -Mickel Baas

## 2022-01-05 NOTE — Telephone Encounter (Signed)
I can do a home visit with her next Wednesday if she is willing. It would be 12 pm.

## 2022-01-06 ENCOUNTER — Ambulatory Visit: Payer: Self-pay

## 2022-01-06 NOTE — Telephone Encounter (Signed)
Lets do Monday 9/18 at 12

## 2022-01-06 NOTE — Telephone Encounter (Signed)
Please schedule her Monday 9/18 at 12pm

## 2022-01-06 NOTE — Telephone Encounter (Signed)
  Chief Complaint: Wants to know if Rollene Fare will come for a house call. Symptoms: urinary frequency, weakness Frequency:  Pertinent Negatives: Patient denies  Disposition: '[]'$ ED /'[]'$ Urgent Care (no appt availability in office) / '[]'$ Appointment(In office/virtual)/ '[]'$  [facility] [facility]/ '[]'$ Home Care/ '[]'$ Refused Recommended Disposition /'[]'$  Mobile Bus/ '[x]'$  Follow-up with PCP Additional Notes: Pt called to see if Rollene Fare will be able to come to her for a house call.   Shared note.  Jearld Fenton, NP to Kizzie Furnish, [place]      01/06/22  9:08 AM Note Lets do Monday 9/18 at 12      Pt states that she is urinating often, but not very much. Pt is drinking water, but states she is very weak.  I asked if pt needed to go to ED. Pt stated she would not go.   Reason for Disposition  Health Information question, no triage required and triager able to answer question  Answer Assessment - Initial Assessment Questions 1. REASON FOR CALL or QUESTION: "What is your reason for calling today?" or "How can I best help you?" or "What question do you have that I can help answer?"     Will Rollene Fare be coming for a house call?  Protocols used: Information Only Call - No Triage-A-AH

## 2022-01-06 NOTE — Telephone Encounter (Signed)
It is okay if you schedule her for 01/11/2022 at 12?  Thanks,   -Mickel Baas

## 2022-01-09 ENCOUNTER — Encounter: Payer: Self-pay | Admitting: Internal Medicine

## 2022-01-09 ENCOUNTER — Other Ambulatory Visit (INDEPENDENT_AMBULATORY_CARE_PROVIDER_SITE_OTHER): Payer: Medicare PPO | Admitting: Internal Medicine

## 2022-01-09 VITALS — BP 142/82 | HR 70 | Resp 18

## 2022-01-09 DIAGNOSIS — R350 Frequency of micturition: Secondary | ICD-10-CM

## 2022-01-09 DIAGNOSIS — K219 Gastro-esophageal reflux disease without esophagitis: Secondary | ICD-10-CM

## 2022-01-09 DIAGNOSIS — G629 Polyneuropathy, unspecified: Secondary | ICD-10-CM

## 2022-01-09 DIAGNOSIS — F32A Depression, unspecified: Secondary | ICD-10-CM

## 2022-01-09 DIAGNOSIS — B001 Herpesviral vesicular dermatitis: Secondary | ICD-10-CM | POA: Diagnosis not present

## 2022-01-09 DIAGNOSIS — I10 Essential (primary) hypertension: Secondary | ICD-10-CM

## 2022-01-09 DIAGNOSIS — N1831 Chronic kidney disease, stage 3a: Secondary | ICD-10-CM

## 2022-01-09 DIAGNOSIS — I700 Atherosclerosis of aorta: Secondary | ICD-10-CM

## 2022-01-09 DIAGNOSIS — I951 Orthostatic hypotension: Secondary | ICD-10-CM | POA: Diagnosis not present

## 2022-01-09 DIAGNOSIS — I208 Other forms of angina pectoris: Secondary | ICD-10-CM

## 2022-01-09 DIAGNOSIS — M159 Polyosteoarthritis, unspecified: Secondary | ICD-10-CM

## 2022-01-09 DIAGNOSIS — E7800 Pure hypercholesterolemia, unspecified: Secondary | ICD-10-CM

## 2022-01-09 DIAGNOSIS — D508 Other iron deficiency anemias: Secondary | ICD-10-CM

## 2022-01-09 DIAGNOSIS — F419 Anxiety disorder, unspecified: Secondary | ICD-10-CM

## 2022-01-09 DIAGNOSIS — M810 Age-related osteoporosis without current pathological fracture: Secondary | ICD-10-CM

## 2022-01-09 DIAGNOSIS — R3911 Hesitancy of micturition: Secondary | ICD-10-CM

## 2022-01-09 DIAGNOSIS — Z86711 Personal history of pulmonary embolism: Secondary | ICD-10-CM

## 2022-01-09 DIAGNOSIS — G4733 Obstructive sleep apnea (adult) (pediatric): Secondary | ICD-10-CM

## 2022-01-09 DIAGNOSIS — R5381 Other malaise: Secondary | ICD-10-CM

## 2022-01-09 DIAGNOSIS — R3989 Other symptoms and signs involving the genitourinary system: Secondary | ICD-10-CM

## 2022-01-09 MED ORDER — BUSPIRONE HCL 15 MG PO TABS: 15.0000 mg | ORAL_TABLET | Freq: Three times a day (TID) | ORAL | 0 refills | Status: DC

## 2022-01-09 MED ORDER — NITROFURANTOIN MONOHYD MACRO 100 MG PO CAPS: 100.0000 mg | ORAL_CAPSULE | Freq: Two times a day (BID) | ORAL | 0 refills | Status: DC

## 2022-01-09 NOTE — Addendum Note (Signed)
Addended by: Ashley Royalty E on: 01/09/2022 03:41 PM   Modules accepted: Orders

## 2022-01-09 NOTE — Assessment & Plan Note (Signed)
Continue valacyclovir as needed 

## 2022-01-09 NOTE — Assessment & Plan Note (Signed)
Continue pregabalin as prescribed

## 2022-01-09 NOTE — Assessment & Plan Note (Signed)
Controlled off meds We will monitor 

## 2022-01-09 NOTE — Assessment & Plan Note (Signed)
Continue fluoxetine We will increase buspirone to 15 mg 3 times daily Support offered

## 2022-01-09 NOTE — Assessment & Plan Note (Signed)
Encourage low-fat diet

## 2022-01-09 NOTE — Assessment & Plan Note (Signed)
She refuses therapy or rehab

## 2022-01-09 NOTE — Assessment & Plan Note (Signed)
Try to avoid foods that trigger reflux Continue omeprazole as prescribed and Tums as needed

## 2022-01-09 NOTE — Assessment & Plan Note (Signed)
She is not currently taking aspirin or statin at this time Encourage low fat diet

## 2022-01-09 NOTE — Assessment & Plan Note (Signed)
Continue oral iron 

## 2022-01-09 NOTE — Assessment & Plan Note (Signed)
Not using CPAP.  

## 2022-01-09 NOTE — Assessment & Plan Note (Signed)
Continue Calcitrol Does not get much weightbearing

## 2022-01-09 NOTE — Assessment & Plan Note (Signed)
Encouraged her to consume a low-fat diet Not currently on statin or aspirin at this time

## 2022-01-09 NOTE — Progress Notes (Signed)
Subjective:    Patient ID: Ann Russell, female    DOB: 1941/04/24, 81 y.o.   MRN: [id number]  HPI  Seen pt in her home for follow up of chronic conditions.  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  She is taking Midodrine as prescribed.  ECG from 06/2021 reviewed.  HLD with Angina, Aortic Atherosclerosis: Her last LDL was 87, triglycerides 99, 09/2021.  She is not taking any cholesterol-lowering medication at this time.  She consumes a low-fat diet.  OSA: She averages 8 hours of sleep per night without the use of her CPAP.  There is no sleep study on file.  OA: Generalized.  She takes Tylenol as needed with good relief of symptoms.  She does not follow with orthopedics.  CKD: Her last creatinine was 1.33, GFR 40, 09/2021.  She is not currently on ACEI/ARB secondary to orthostasis.  She does not follow with nephrology.  Neuropathy: Managed with Pregabalin.  She does not follow with neurology.  Osteoporosis: She is taking Calcitrol as prescribed.  She is nonweightbearing.  She has not had a bone density exam in >5 years.  Anxiety and Depression: Chronic, managed on Fluoxetine and Buspirone.  She is not currently seeing a therapist.  She denies SI/HI.  History of PE: She is not currently on any anticoagulation at this time.  She does not follow with hematology or vascular.  GERD: She intermittently has breakthrough on Omeprazole for which she takes Tums with good relief.  Upper GI from 06/2021 reviewed.  Anemia: Her last H/H was 13.7/43, 09/2021.  She is taking oral Iron as prescribed.  She does not follow with hematology.  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  She also reports urinary frequency and hesitancy.  This started 10 days ago.  She orts associated bladder pressure but denies urgency, dysuria or blood in her urine.  She denies fever, chills, nausea or low back pain.  She has not tried anything OTC for this.  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  Takes Vasotec for kidneys.-stage 3   Depression    Hyperlipemia    Hypertension    h/o had gastric bypass and was able to come off meds   Neuropathy    Pulmonary embolism ([facility])    Sleep apnea    no cpap since her gastric bypass    Current Outpatient Medications  Medication Sig Dispense Refill   acetaminophen (TYLENOL) 650 MG CR tablet Take 650 mg by mouth 2 (two) times a day.     B COMPLEX VITAMINS SL Place 1 tablet under the tongue daily.     busPIRone (BUSPAR) 15 MG tablet TAKE 1 TABLET TWICE DAILY 180 tablet 0   calcitRIOL (ROCALTROL) 0.25 MCG capsule TAKE 1 CAPSULE EVERY DAY 90 capsule 1   Calcium Carb-Cholecalciferol (CALCIUM 600 + D PO) Take 1 tablet by mouth daily.     denosumab (PROLIA) 60 MG/ML SOSY injection Inject 60 mg into the skin every 6 (six) months.     ferrous [id number] C-folic acid (TRINSICON / FOLTRIN) capsule Take 1 capsule by mouth 2 (two) times daily. 20 capsule    FLUoxetine (PROZAC) 20 MG capsule TAKE 3 CAPSULES EVERY MORNING 270 capsule 0   midodrine (PROAMATINE) 5 MG tablet  Take 1 tablet (5 mg total) by mouth 2 (two) times daily with a meal. 60 tablet 0   Multiple Vitamin (MULTI-VITAMINS) TABS Take 2 tablets by mouth 2 (two) times daily.     omeprazole (PRILOSEC) 40 MG capsule TAKE 1 CAPSULE EVERY DAY 90 capsule 0   pregabalin (LYRICA) 100 MG capsule Take 1 capsule (100 mg total) by mouth 3 (three) times daily. 270 capsule 0   Pyridoxine HCl (VITAMIN B6 PO) Take 1 tablet by mouth daily. With b12     valACYclovir (VALTREX) 1000 MG tablet Take 1 tablet (1,000 mg total) by mouth daily as needed (fever blisters). 30 tablet 2   No current facility-administered medications for this visit.    No Known Allergies  Family History  Problem Relation Age of Onset   Heart disease Mother     Dementia Mother    Neuropathy Father    Diabetes Brother    Breast cancer Neg Hx     Social History   Socioeconomic History   Marital status: Significant Other    Spouse name: Not on file   Number of children: 1   Years of education: Not on file   Highest education level: Not on file  Occupational History   Occupation: retired  Tobacco Use   Smoking status: Never   Smokeless tobacco: Never  Vaping Use   Vaping Use: Never used  Substance and Sexual Activity   Alcohol use: No   Drug use: No   Sexual activity: Not Currently  Other Topics Concern   Not on file  Social History Narrative   Not on file   Social Determinants of Health   Financial Resource Strain: Low Risk  (09/29/2021)   Overall Financial Resource Strain (CARDIA)    Difficulty of Paying Living Expenses: Not hard at all  Food Insecurity: No Food Insecurity (09/29/2021)   Hunger Vital Sign    Worried About Running Out of Food in the Last Year: Never true    [place] in the Last Year: Never true  Transportation Needs: No Transportation Needs (09/29/2021)   PRAPARE - Hydrologist (Medical): No    Lack of Transportation (Non-Medical): No  Physical Activity: Inactive (09/29/2021)   Exercise Vital Sign    Days of Exercise per Week: 0 days    Minutes of Exercise per Session: 0 min  Stress: No Stress Concern Present (09/29/2021)   [place]    Feeling of Stress : Not at all  Social Connections: Moderately Isolated (09/29/2021)   Social Connection and Isolation Panel [NHANES]    Frequency of Communication with Friends and Family: More than three times a week    Frequency of Social Gatherings with Friends and Family: More than three times a week    Attends Religious Services: Never    Marine scientist or Organizations: No    Attends Archivist Meetings: Never    Marital Status: Living with partner  Intimate  Partner Violence: Not on file     Constitutional: Patient reports fatigue.  Denies fever, malaise, headache or abrupt weight changes.  HEENT: Denies eye pain, eye redness, ear pain, ringing in the ears, wax buildup, runny nose, nasal congestion, bloody nose, or sore throat. Respiratory: Denies difficulty breathing, shortness of breath, cough or sputum production.   Cardiovascular: Denies chest pain, chest tightness, palpitations or swelling in the hands or feet.  Gastrointestinal: Denies abdominal pain,  bloating, constipation, diarrhea or blood in the stool.  GU: Pt reports bladder pressure, urinary frequency and hesitancy. Denies urgency, pain with urination, burning sensation, blood in urine, odor or discharge. Musculoskeletal: Pt reports intermittent joint pain, difficulty with gait. Denies decrease in range of motion, muscle pain or joint swelling.  Skin: Denies redness, rashes, lesions or ulcercations.  Neurological: Pt reports neuropathic pain, difficulty with balance. Denies dizziness, difficulty with memory, difficulty with speech or problems with coordination.  Psych: Pt has a history of anxiety and depression. Denies SI/HI.  No other specific complaints in a complete review of systems (except as listed in HPI above).     Objective:   Physical Exam  BP (!) 142/82   Pulse 70   Resp 18   SpO2 97%   Wt Readings from Last 3 Encounters:  07/18/21 164 lb 7.4 oz (74.6 kg)  06/26/21 164 lb 7.4 oz (74.6 kg)  06/20/21 171 lb 15.3 oz (78 kg)    General: Appears her stated age, well developed, well nourished in NAD. Skin: Warm, dry and intact. HEENT: Head: normal shape and size; Eyes: sclera white, no icterus, conjunctiva pink, PERRLA and EOMs intact;  Cardiovascular: Normal rate and rhythm. S1,S2 noted.  No murmur, rubs or gallops noted. No JVD or BLE edema. No carotid bruits noted. Pulmonary/Chest: Normal effort and positive vesicular breath sounds. No respiratory distress. No  wheezes, rales or ronchi noted.  Abdomen: Soft and mildly tender over the bladder.. Normal bowel sounds. No distention or masses noted. Liver, spleen and kidneys non palpable. Musculoskeletal: In reclining chair. Neurological: Alert and oriented. Cranial nerves II-XII grossly intact. Coordination normal.  Psychiatric: Mood and affect normal. Behavior is normal. Judgment and thought content normal.   BMET    Component Value Date/Time   NA 140 09/22/2021 0944   K 3.7 09/22/2021 0944   CL 103 09/22/2021 0944   CO2 25 09/22/2021 0944   GLUCOSE 86 09/22/2021 0944   BUN 23 09/22/2021 0944   CREATININE 1.33 (H) 09/22/2021 0944   CALCIUM 10.7 (H) 09/22/2021 0944   GFRNONAA 36 (L) 07/18/2021 1536   GFRAA 30 (L) 09/20/2019 1418    Lipid Panel     Component Value Date/Time   CHOL 195 09/22/2021 0944   TRIG 99 09/22/2021 0944   HDL 89 09/22/2021 0944   CHOLHDL 2.2 09/22/2021 0944   LDLCALC 87 09/22/2021 0944    CBC    Component Value Date/Time   WBC 6.9 09/22/2021 0944   RBC 4.47 09/22/2021 0944   HGB 13.7 09/22/2021 0944   HCT 43.0 09/22/2021 0944   PLT 264 09/22/2021 0944   MCV 96.2 09/22/2021 0944   MCH 30.6 09/22/2021 0944   MCHC 31.9 (L) 09/22/2021 0944   RDW 15.5 (H) 09/22/2021 0944   LYMPHSABS 1,842 09/22/2021 0944   MONOABS 0.5 06/23/2021 1714   EOSABS 21 09/22/2021 0944   BASOSABS 41 09/22/2021 0944    Hgb A1C No results found for: "HGBA1C"          Assessment & Plan:   Urinary Frequency, Hesitancy, Bladder Pressure:  She was unable to give a urine specimen while I was there so I left a urine cup for her to drop off at the office Rx for Macrobid 100 mg twice daily x5 days Push fluids  I will follow-up with her in 6 months at her home. Webb Silversmith, NP    Webb Silversmith, NP

## 2022-01-09 NOTE — Assessment & Plan Note (Signed)
Stable on midodrine We will monitor

## 2022-01-09 NOTE — Assessment & Plan Note (Signed)
We will monitor

## 2022-01-09 NOTE — Assessment & Plan Note (Signed)
Finished with treatment

## 2022-01-09 NOTE — Assessment & Plan Note (Signed)
Continue Tylenol as needed 

## 2022-01-10 LAB — URINE CULTURE
MICRO NUMBER:: 13931204
SPECIMEN QUALITY:: ADEQUATE

## 2022-01-10 LAB — POCT URINALYSIS DIPSTICK
Bilirubin, UA: NEGATIVE
Blood, UA: NEGATIVE
Glucose, UA: NEGATIVE
Ketones, UA: NEGATIVE
Leukocytes, UA: NEGATIVE
Nitrite, UA: NEGATIVE
Protein, UA: POSITIVE — AB
Spec Grav, UA: 1.015 (ref 1.010–1.025)
Urobilinogen, UA: 0.2 E.U./dL
pH, UA: 5 (ref 5.0–8.0)

## 2022-01-10 NOTE — Addendum Note (Signed)
Addended by: Ashley Royalty E on: 01/10/2022 11:33 AM   Modules accepted: Orders

## 2022-01-13 MED ORDER — OXYBUTYNIN CHLORIDE ER 5 MG PO TB24: 5.0000 mg | ORAL_TABLET | Freq: Every day | ORAL | 1 refills | Status: DC

## 2022-01-13 NOTE — Addendum Note (Signed)
Addended by: Jearld Fenton on: 01/13/2022 09:59 AM   Modules accepted: Orders

## 2022-02-20 ENCOUNTER — Encounter (INDEPENDENT_AMBULATORY_CARE_PROVIDER_SITE_OTHER): Payer: Self-pay

## 2022-06-03 ENCOUNTER — Other Ambulatory Visit: Payer: Self-pay | Admitting: Internal Medicine

## 2022-06-05 NOTE — Telephone Encounter (Signed)
Requested medication (s) are due for refill today: 10/11/21 #90 1 RF  Requested medication (s) are on the active medication list: yes  Last refill:  10/11/21 #90 1 RF  Future visit scheduled: no  Notes to clinic:  overdue lab work   Requested Prescriptions  Pending Prescriptions Disp Refills   calcitRIOL (ROCALTROL) 0.25 MCG capsule [Pharmacy Med Name: CALCITRIOL 0.25 MCG Capsule] 90 capsule     Sig: TAKE [place]     Endocrinology:  Vitamins - Vitamin D Supplementation - calcitriol Failed - 06/03/2022  3:53 AM      Failed - Phosphate in normal range and within 360 days    No results found for: "PHOS"       Failed - PTH in normal range and within 360 days    No results found for: "IOPTH", "PTHINTACTFNA", "PTH"       Failed - Ca in normal range and within 360 days    Calcium  Date Value Ref Range Status  09/22/2021 10.7 (H) 8.6 - 10.4 mg/dL Final         Passed - Valid encounter within last 12 months    Recent Outpatient Visits           11 months ago Epigastric pain   [place], Coralie Keens, NP   1 year ago Aortic atherosclerosis [facility])   [facility] [place], Coralie Keens, NP              Signed Prescriptions Disp Refills   oxybutynin (DITROPAN-XL) 5 MG 24 hr tablet 90 tablet 1    Sig: TAKE 1 TABLET (5 MG TOTAL) BY MOUTH AT BEDTIME.     Urology:  Bladder Agents Passed - 06/03/2022  3:53 AM      Passed - Valid encounter within last 12 months    Recent Outpatient Visits           11 months ago Epigastric pain   [facility] [place], Coralie Keens, NP   1 year ago Aortic atherosclerosis [facility])   [facility] [place], [place] W, NP               FLUoxetine (PROZAC) 20 MG capsule 270 capsule 0    Sig: TAKE [place]     Psychiatry:  Antidepressants - SSRI Failed - 06/03/2022  3:53 AM      Failed - Valid encounter within last 6 months     Recent Outpatient Visits           11 months ago Epigastric pain   [facility] [place], Coralie Keens, NP   1 year ago Aortic atherosclerosis [facility])   [place], Coralie Keens, NP              Passed - Completed PHQ-2 or PHQ-9 in the last 360 days       busPIRone (BUSPAR) 15 MG tablet 180 tablet 2    Sig: TAKE 1 TABLET TWICE DAILY     Psychiatry: Anxiolytics/Hypnotics - Non-controlled Passed - 06/03/2022  3:53 AM      Passed - Valid encounter within last 12 months    Recent Outpatient Visits           11 months ago Epigastric pain   [facility] [place], Coralie Keens, NP   1 year ago Aortic  atherosclerosis [facility])   [facility] [place], [place] W, NP               omeprazole (PRILOSEC) 40 MG capsule 90 capsule 1    Sig: TAKE [place]     Gastroenterology: Proton Pump Inhibitors Passed - 06/03/2022  3:53 AM      Passed - Valid encounter within last 12 months    Recent Outpatient Visits           11 months ago Epigastric pain   [facility] [place], Coralie Keens, NP   1 year ago Aortic atherosclerosis [facility])   [facility] [place], Coralie Keens, [place]

## 2022-06-05 NOTE — Telephone Encounter (Signed)
Requested Prescriptions  Pending Prescriptions Disp Refills   oxybutynin (DITROPAN-XL) 5 MG 24 hr tablet [Pharmacy Med Name: OXYBUTYNIN CHLORIDE ER 5 MG Tablet Extended Release 24 Hour] 90 tablet 1    Sig: TAKE 1 TABLET (5 MG TOTAL) BY MOUTH AT BEDTIME.     Urology:  Bladder Agents Passed - 06/03/2022  3:53 AM      Passed - Valid encounter within last 12 months    Recent Outpatient Visits           11 months ago Epigastric pain   [facility] [place], [place] W, NP   1 year ago Aortic atherosclerosis [facility])   [facility] [place], Coralie Keens, NP               FLUoxetine (PROZAC) 20 MG capsule [Pharmacy Med Name: FLUOXETINE HYDROCHLORIDE 20 MG Capsule] 270 capsule 0    Sig: TAKE [place]     Psychiatry:  Antidepressants - SSRI Failed - 06/03/2022  3:53 AM      Failed - Valid encounter within last 6 months    Recent Outpatient Visits           11 months ago Epigastric pain   [facility] [place], [place] W, NP   1 year ago Aortic atherosclerosis [facility])   [facility] [place], Coralie Keens, NP              Passed - Completed PHQ-2 or PHQ-9 in the last 360 days       busPIRone (BUSPAR) 15 MG tablet [Pharmacy Med Name: BUSPIRONE HYDROCHLORIDE 15 MG Tablet] 180 tablet 2    Sig: TAKE 1 TABLET TWICE DAILY     Psychiatry: Anxiolytics/Hypnotics - Non-controlled Passed - 06/03/2022  3:53 AM      Passed - Valid encounter within last 12 months    Recent Outpatient Visits           11 months ago Epigastric pain   [facility] [place], [place] W, NP   1 year ago Aortic atherosclerosis [facility])   [facility] [place], Coralie Keens, NP               calcitRIOL (ROCALTROL) 0.25 MCG capsule [Pharmacy Med Name: CALCITRIOL 0.25 MCG Capsule] 90 capsule     Sig: TAKE [place]     Endocrinology:  Vitamins - Vitamin D  Supplementation - calcitriol Failed - 06/03/2022  3:53 AM      Failed - Phosphate in normal range and within 360 days    No results found for: "PHOS"       Failed - PTH in normal range and within 360 days    No results found for: "IOPTH", "PTHINTACTFNA", "PTH"       Failed - Ca in normal range and within 360 days    Calcium  Date Value Ref Range Status  09/22/2021 10.7 (H) 8.6 - 10.4 mg/dL Final         Passed - Valid encounter within last 12 months    Recent Outpatient Visits           11 months ago Epigastric pain   [place], Coralie Keens, NP   1 year ago Aortic atherosclerosis [facility])   [facility] [place], Coralie Keens, [place]  omeprazole (PRILOSEC) 40 MG capsule [Pharmacy Med Name: OMEPRAZOLE 40 MG Capsule Delayed Release] 90 capsule 1    Sig: TAKE 1 CAPSULE EVERY DAY     Gastroenterology: Proton Pump Inhibitors Passed - 06/03/2022  3:53 AM      Passed - Valid encounter within last 12 months    Recent Outpatient Visits           11 months ago Epigastric pain   [facility] [place], Coralie Keens, NP   1 year ago Aortic atherosclerosis [facility])   [facility] [place], Coralie Keens, [place]

## 2022-08-07 ENCOUNTER — Telehealth: Payer: Medicare PPO

## 2022-08-07 ENCOUNTER — Ambulatory Visit: Payer: Self-pay

## 2022-08-07 NOTE — Telephone Encounter (Signed)
  Chief Complaint: Itchy rash from under her breasts to her knees. Symptoms: above Frequency: 1 week Pertinent Negatives: Patient denies  Disposition: [] ED /[] Urgent Care (no appt availability in office) / [] Appointment(In office/virtual)/ [x]  [place] [facility] [facility]/ [] Home Care/ [] Refused Recommended Disposition /[] [place] Mobile [facility]/ []  Follow-up with PCP Additional Notes: Pt states that she has had an itchy rash for 1 week. Pt has tried benadryl without relief. Pt would like Regina to come to her home for visit if possible.  Made [facility] please advise.    Summary: Rash, Itchy   Rash starting from the middle of her stomach to her knees. Itchy and hot  (1 week) Tried benadryl but did not work.       Reason for Disposition  [1] MODERATE-SEVERE widespread itching (i.e., interferes with sleep, normal activities or school) AND [2] not improved after 24 hours of itching Care Advice  Answer Assessment - Initial Assessment Questions 1. DESCRIPTION: "Describe the itching you are having."     Rash from beneath breasts to knees 2. SEVERITY: "How bad is it?"    - MILD: Doesn't interfere with normal activities.   - MODERATE-SEVERE: Interferes with work, school, sleep, or other activities.      severe 3. SCRATCHING: "Are there any scratch marks? Bleeding?"      4. ONSET: "When did this begin?"      A week ago 5. CAUSE: "What do you think is causing the itching?" (ask about swimming pools, pollen, animals, soaps, etc.)     No 6. OTHER SYMPTOMS: "Do you have any other symptoms?"      No  Protocols used: Itching - [facility]

## 2022-08-08 NOTE — Telephone Encounter (Signed)
Pt agreed to 11:20 tomorrow.   Thanks,   -Vernona Rieger

## 2022-08-08 NOTE — Telephone Encounter (Signed)
Schedule her tomorrow in the 1120 slot

## 2022-08-09 ENCOUNTER — Other Ambulatory Visit: Payer: Medicare PPO | Admitting: Internal Medicine

## 2022-08-09 ENCOUNTER — Encounter: Payer: Self-pay | Admitting: Internal Medicine

## 2022-08-09 VITALS — BP 138/78 | HR 58 | Resp 18

## 2022-08-09 DIAGNOSIS — I951 Orthostatic hypotension: Secondary | ICD-10-CM | POA: Diagnosis not present

## 2022-08-09 DIAGNOSIS — G629 Polyneuropathy, unspecified: Secondary | ICD-10-CM | POA: Diagnosis not present

## 2022-08-09 DIAGNOSIS — R21 Rash and other nonspecific skin eruption: Secondary | ICD-10-CM | POA: Diagnosis not present

## 2022-08-09 DIAGNOSIS — D508 Other iron deficiency anemias: Secondary | ICD-10-CM

## 2022-08-09 DIAGNOSIS — M159 Polyosteoarthritis, unspecified: Secondary | ICD-10-CM

## 2022-08-09 DIAGNOSIS — N3281 Overactive bladder: Secondary | ICD-10-CM

## 2022-08-09 DIAGNOSIS — I700 Atherosclerosis of aorta: Secondary | ICD-10-CM

## 2022-08-09 DIAGNOSIS — F32A Depression, unspecified: Secondary | ICD-10-CM

## 2022-08-09 DIAGNOSIS — G4733 Obstructive sleep apnea (adult) (pediatric): Secondary | ICD-10-CM | POA: Diagnosis not present

## 2022-08-09 DIAGNOSIS — R296 Repeated falls: Secondary | ICD-10-CM

## 2022-08-09 DIAGNOSIS — B001 Herpesviral vesicular dermatitis: Secondary | ICD-10-CM | POA: Diagnosis not present

## 2022-08-09 DIAGNOSIS — E7800 Pure hypercholesterolemia, unspecified: Secondary | ICD-10-CM

## 2022-08-09 DIAGNOSIS — K219 Gastro-esophageal reflux disease without esophagitis: Secondary | ICD-10-CM

## 2022-08-09 DIAGNOSIS — R5381 Other malaise: Secondary | ICD-10-CM

## 2022-08-09 DIAGNOSIS — S99921A Unspecified injury of right foot, initial encounter: Secondary | ICD-10-CM

## 2022-08-09 DIAGNOSIS — F419 Anxiety disorder, unspecified: Secondary | ICD-10-CM

## 2022-08-09 DIAGNOSIS — Z86711 Personal history of pulmonary embolism: Secondary | ICD-10-CM

## 2022-08-09 DIAGNOSIS — M810 Age-related osteoporosis without current pathological fracture: Secondary | ICD-10-CM

## 2022-08-09 DIAGNOSIS — N1831 Chronic kidney disease, stage 3a: Secondary | ICD-10-CM

## 2022-08-09 MED ORDER — PREDNISONE 10 MG PO TABS: ORAL_TABLET | ORAL | 0 refills | Status: AC

## 2022-08-09 NOTE — Assessment & Plan Note (Signed)
Unable to check kidney function as patient is homebound

## 2022-08-09 NOTE — Assessment & Plan Note (Signed)
Causing frequent falls Husband declines home health PT Continue pregabalin

## 2022-08-09 NOTE — Assessment & Plan Note (Signed)
Husband declines home health PT

## 2022-08-09 NOTE — Assessment & Plan Note (Signed)
Unable to obtain labs as patient is homebound Not currently on statin or aspirin Reinforced low-fat diet

## 2022-08-09 NOTE — Assessment & Plan Note (Signed)
Mostly nonweightbearing Continue Calcitrol

## 2022-08-09 NOTE — Assessment & Plan Note (Signed)
Unable to check CBC as patient is homebound Continue iron for now

## 2022-08-09 NOTE — Assessment & Plan Note (Signed)
Not on any anticoagulation

## 2022-08-09 NOTE — Progress Notes (Signed)
Subjective:    Patient ID: Ann Russell, female    DOB: Oct 30, 1940, 82 y.o.   MRN: [id number]  HPI  Patient seen in her home for follow-up of chronic conditions.  Orthostatic HTN: Her BP today is 128/78.  She is not currently taking any antihypertensive medications at this time.  She is taking Midodrine as prescribed.  ECG from 06/2021 reviewed.  HLD with Aortic Atherosclerosis: Her last LDL was 87, triglycerides 99, 09/2021.  She is not taking any cholesterol-lowering medication at this time.  She is not currently taking Aspirin.  She tries to consume a low-fat diet.  OSA: She averages 7 hours of sleep per night without the use of her CPAP.  There is no sleep study on file.  OA: Generalized.  She takes Tylenol as needed with good relief of symptoms.  She does not follow with orthopedics.  CKD: Her last creatinine was 1.33, GFR 40, 09/2021.  She is not currently on an ACEI/ARB secondary to orthostasis.  She does not follow with nephrology.  Neuropathy: She reports 2 falls within the last week.  She is taking Pregabalin as prescribed.  She does not follow with neurology.  Osteoporosis: Managed with Calcitriol.  She is no longer taking Prolia because she is unable to leave her home.  She has not had a bone density exam in >5 years.  Anxiety and Depression: Chronic, managed on Fluoxetine and Buspirone.  She is not currently seeing a therapist.  She denies SI/HI.  History of PE: Remote history.  She is not currently taking any anticoagulants at this time.  GERD: She denies breakthrough on Omeprazole.  Upper GI from 06/2021 reviewed.  Anemia: Her last H/H she was 13.7/43, 6/23.  She is taking oral Iron as prescribed.  She does not follow with hematology.  Recurrent Cold Sores: Managed with Valacyclovir.  OAB: Managed on Ditropan.  She does not follow with urology.  She also reports a rash.  She noticed this 4 to 5 days ago.  It started on her abdomen, has spread to her upper thighs and in  her right arm.  The rash itches.  She denies changes in soaps, lotions, detergents, diet or medications.  She has tried Hydrocortisone cream and Benadryl OTC with minimal relief of symptoms.  She also reports right foot pain and swelling.  She reports this occurred after a fall a few days ago.  She is able to bear weight on it but reports it is very painful.    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  Takes Vasotec for kidneys.-stage 3   Depression    Hyperlipemia    Hypertension    h/o had gastric bypass and was able to come off meds   Neuropathy    Pulmonary embolism (HCC)    Sleep apnea    no cpap since her gastric bypass    Current Outpatient Medications  Medication Sig Dispense Refill   acetaminophen (TYLENOL) 650 MG CR tablet Take 650 mg by mouth 2 (two) times a day.     B COMPLEX VITAMINS SL Place 1 tablet under the tongue daily.     busPIRone (BUSPAR) 15 MG tablet TAKE 1 TABLET TWICE DAILY 180 tablet 2   calcitRIOL (ROCALTROL) 0.25 MCG capsule TAKE 1 CAPSULE EVERY  DAY 90 capsule 0   Calcium Carb-Cholecalciferol (CALCIUM 600 + D PO) Take 1 tablet by mouth daily.     denosumab (PROLIA) 60 MG/ML SOSY injection Inject 60 mg into the skin every 6 (six) months.     ferrous fumarate-b12-vitamic C-folic acid (TRINSICON / FOLTRIN) capsule Take 1 capsule by mouth 2 (two) times daily. 20 capsule    FLUoxetine (PROZAC) 20 MG capsule TAKE 3 CAPSULES EVERY MORNING 270 capsule 0   midodrine (PROAMATINE) 5 MG tablet Take 1 tablet (5 mg total) by mouth 2 (two) times daily with a meal. 60 tablet 0   Multiple Vitamin (MULTI-VITAMINS) TABS Take 2 tablets by mouth 2 (two) times daily.     nitrofurantoin, macrocrystal-monohydrate, (MACROBID) 100 MG capsule Take 1 capsule (100 mg total) by mouth 2 (two) times daily. 10 capsule 0   omeprazole (PRILOSEC) 40 MG capsule  TAKE 1 CAPSULE EVERY DAY 90 capsule 1   oxybutynin (DITROPAN-XL) 5 MG 24 hr tablet TAKE 1 TABLET (5 MG TOTAL) BY MOUTH AT BEDTIME. 90 tablet 1   pregabalin (LYRICA) 100 MG capsule Take 1 capsule (100 mg total) by mouth 3 (three) times daily. 270 capsule 0   Pyridoxine HCl (VITAMIN B6 PO) Take 1 tablet by mouth daily. With b12     valACYclovir (VALTREX) 1000 MG tablet Take 1 tablet (1,000 mg total) by mouth daily as needed (fever blisters). 30 tablet 2   No current facility-administered medications for this visit.    No Known Allergies  Family History  Problem Relation Age of Onset   Heart disease Mother    Dementia Mother    Neuropathy Father    Diabetes Brother    Breast cancer Neg Hx     Social History   Socioeconomic History   Marital status: Significant Other    Spouse name: Not on file   Number of children: 1   Years of education: Not on file   Highest education level: Not on file  Occupational History   Occupation: retired  Tobacco Use   Smoking status: Never   Smokeless tobacco: Never  Vaping Use   Vaping Use: Never used  Substance and Sexual Activity   Alcohol use: No   Drug use: No   Sexual activity: Not Currently  Other Topics Concern   Not on file  Social History Narrative   Not on file   Social Determinants of Health   Financial Resource Strain: Low Risk  (09/29/2021)   Overall Financial Resource Strain (CARDIA)    Difficulty of Paying Living Expenses: Not hard at all  Food Insecurity: No Food Insecurity (09/29/2021)   Hunger Vital Sign    Worried About Running Out of Food in the Last Year: Never true    Ran Out of Food in the Last Year: Never true  Transportation Needs: No Transportation Needs (09/29/2021)   PRAPARE - Administrator, Civil Service (Medical): No    Lack of Transportation (Non-Medical): No  Physical Activity: Inactive (09/29/2021)   Exercise Vital Sign    Days of Exercise per Week: 0 days    Minutes of Exercise per Session: 0  min  Stress: No Stress Concern Present (09/29/2021)   [employer] of Occupational Health - Occupational Stress Questionnaire    Feeling of Stress : Not at all  Social Connections: Moderately Isolated (09/29/2021)   Social Connection and Isolation Panel [NHANES]    Frequency of Communication with Friends and Family: More than three times a week  Frequency of Social Gatherings with Friends and Family: More than three times a week    Attends Religious Services: Never    Database administrator or Organizations: No    Attends Banker Meetings: Never    Marital Status: Living with partner  Intimate Partner Violence: Not on file     Constitutional: Denies fever, malaise, fatigue, headache or abrupt weight changes.  HEENT: Denies eye pain, eye redness, ear pain, ringing in the ears, wax buildup, runny nose, nasal congestion, bloody nose, or sore throat. Respiratory: Denies difficulty breathing, shortness of breath, cough or sputum production.   Cardiovascular: Denies chest pain, chest tightness, palpitations or swelling in the hands or feet.  Gastrointestinal: Denies abdominal pain, bloating, constipation, diarrhea or blood in the stool.  GU: Patient reports urinary urgency and frequency.  Denies  pain with urination, burning sensation, blood in urine, odor or discharge. Musculoskeletal: Patient reports difficulty with gait, joint pain and swelling.  Denies decrease in range of motion, muscle pain.  Skin: Patient reports rash, bruising right foot.  Denies ulcerations.  Neurological: Patient reports neuropathic pain, difficulty with balance and coordination.  Denies dizziness, difficulty with memory, difficulty with speech.  Psych: Patient has a history of anxiety and depression.  Denies SI/HI.  No other specific complaints in a complete review of systems (except as listed in HPI above).  Objective:   Physical Exam   BP 138/78   Pulse (!) 58   Resp 18   SpO2 94%   Wt  Readings from Last 3 Encounters:  07/18/21 164 lb 7.4 oz (74.6 kg)  06/26/21 164 lb 7.4 oz (74.6 kg)  06/20/21 171 lb 15.3 oz (78 kg)    General: Appears her stated age, chronically ill-appearing, in NAD. Skin: Grouped maculopapular rash noted of lower abdomen, bilateral upper thighs and right forearm.  Contusion noted of her right foot from her toes to her midfoot. HEENT: Head: normal shape and size; Eyes: sclera white and EOMs intact; Cardiovascular: Bradycardic with normal rhythm. S1,S2 noted.  No murmur, rubs or gallops noted. No JVD or BLE edema.  Pulmonary/Chest: Normal effort and positive vesicular breath sounds. No respiratory distress. No wheezes, rales or ronchi noted.  Abdomen: Soft and nontender. Normal bowel sounds.  Musculoskeletal: 1+ swelling noted of the right foot and ankle.  She has generalized pain with palpation over the mid metatarsals and right great toe. Neurological: Alert and oriented.  Decreased sensation of BLE.  Coordination normal.  Psychiatric: Mood and affect normal. Behavior is normal. Judgment and thought content normal.     BMET    Component Value Date/Time   NA 140 09/22/2021 0944   K 3.7 09/22/2021 0944   CL 103 09/22/2021 0944   CO2 25 09/22/2021 0944   GLUCOSE 86 09/22/2021 0944   BUN 23 09/22/2021 0944   CREATININE 1.33 (H) 09/22/2021 0944   CALCIUM 10.7 (H) 09/22/2021 0944   GFRNONAA 36 (L) 07/18/2021 1536   GFRAA 30 (L) 09/20/2019 1418    Lipid Panel     Component Value Date/Time   CHOL 195 09/22/2021 0944   TRIG 99 09/22/2021 0944   HDL 89 09/22/2021 0944   CHOLHDL 2.2 09/22/2021 0944   LDLCALC 87 09/22/2021 0944    CBC    Component Value Date/Time   WBC 6.9 09/22/2021 0944   RBC 4.47 09/22/2021 0944   HGB 13.7 09/22/2021 0944   HCT 43.0 09/22/2021 0944   PLT 264 09/22/2021 0944   MCV 96.2  09/22/2021 0944   MCH 30.6 09/22/2021 0944   MCHC 31.9 (L) 09/22/2021 0944   RDW 15.5 (H) 09/22/2021 0944   LYMPHSABS 1,842  09/22/2021 0944   MONOABS 0.5 06/23/2021 1714   EOSABS 21 09/22/2021 0944   BASOSABS 41 09/22/2021 0944    Hgb A1C No results found for: "HGBA1C"         Assessment & Plan:   Rash:  Appears to be an allergic reaction Rx for Pred taper x 6 days  Right Foot Injury due to Frequent Falls:  Discussed with patient and husband that there could be a fracture but I am unable to obtain x-ray from her home.  Her husband is unable to bring her to the office for x-ray.  Discussed calling EMS to take her to the ER for evaluation however he does not want to have her sit and wait in the waiting room.  She is in agreement with this.  Discussed the risks of a serious fracture that is not evaluated include permanent pain, damage and even further difficulty with gait.  They understand the risk and agree that they do not want further evaluation at this time Encouraged ice and elevation   Will return in 6 months for follow-up of chronic conditions Nicki Reaper, NP

## 2022-08-09 NOTE — Assessment & Plan Note (Signed)
She seems to be doing well on midodrine We will monitor

## 2022-08-09 NOTE — Assessment & Plan Note (Signed)
Continue Ditropan 

## 2022-08-09 NOTE — Patient Instructions (Signed)
RICE Therapy for Routine Care of Injuries The routine care of many injuries includes rest, ice, compression, and elevation (RICE therapy). RICE therapy is often recommended for injuries to soft tissues, such as muscle strain, sprains, bruises, and overuse injuries. It can also be used for some bone injuries. Using RICE therapy can help to relieve pain and lessen swelling. Supplies needed: Ice. Plastic bag. Towel. Elastic bandage. Pillow or pillows to raise (elevate) the injured body part. How to care for your injury with RICE therapy  Rest Rest your injury. This may help with the healing process. Rest usually involves limiting your normal activities and not using the injured part of your body. Generally, you can return to your normal activities when your health care provider says it is okay and you can do them without much discomfort. If you rest the injury too much, it may not heal as well. Some injuries heal better with early movement instead of resting for too long. Talk with your health care provider about how you should limit your activities and whether you should start range-of-motion exercises for your injury. Ice Ice your injury to lessen swelling and pain. Do not apply ice directly to your skin. Put ice in a plastic bag. Place a towel between your skin and the bag. Leave the ice on for 20 minutes, 2-3 times a day. Use ice on as many days as told by your health care provider. Compression Put pressure (compression) on your injured area to control swelling, give support, and help with discomfort. Compression may be done with an elastic bandage. If an elastic bandage has been applied, follow these general tips: Use the bandage as directed by the maker of the bandage that you are using. Do not wrap the bandage too tightly. That may block (cut off) circulation in the arm or leg in the area below the bandage. If part of your body beyond the bandage becomes blue, numb, cold, swollen, or more  painful, your bandage is probably too tight. If this occurs, remove your bandage and reapply it more loosely. Remove and reapply the bandage every 3-4 hours or as told by your health care provider. See your health care provider if the bandage seems to be making your problems worse rather than better. Elevation Elevate your injured area to lessen swelling and pain. If possible, elevate your injured area at or above the level of your heart or the center of your chest. Contact a health care provider if: Your pain and swelling continue. Your symptoms are getting worse rather than improving. Having these problems may mean that you need further evaluation or imaging tests, such as X-rays or an MRI. Sometimes, X-rays may not show a small broken bone (fracture) until days after the injury happened. Make a follow-up appointment with your health care provider. Ask your health care provider, or the department that is doing the imaging test, when your results will be ready. Get help right away if: You have sudden severe pain at or below the area of your injury. You have redness or increased swelling around your injury. You have tingling or numbness at or below the area of your injury and it does not improve after you remove the elastic bandage. Summary The routine care of many injuries includes rest, ice, compression, and elevation (RICE therapy). Using RICE therapy can help to relieve pain and lessen swelling. RICE therapy is often recommended for injuries to soft tissues, such as muscle strain, sprains, bruises, and overuse injuries. It can also   be used for some bone injuries. Seek medical care if your pain and swelling continue or if your symptoms are getting worse rather than improving. This information is not intended to replace advice given to you by your health care provider. Make sure you discuss any questions you have with your health care provider. Document Revised: 06/16/2019 Document Reviewed:  12/29/2016 Elsevier Patient Education  2021 Elsevier Inc.  

## 2022-08-09 NOTE — Assessment & Plan Note (Signed)
Husband declines home health PT 

## 2022-08-09 NOTE — Assessment & Plan Note (Signed)
Unable to check lipid panel as patient is homebound Encourage low-fat diet

## 2022-08-09 NOTE — Assessment & Plan Note (Signed)
Noncompliant with CPAP use. ?

## 2022-08-09 NOTE — Assessment & Plan Note (Signed)
Try to identify and avoid foods that trigger reflux Continue omeprazole 

## 2022-08-09 NOTE — Assessment & Plan Note (Signed)
Continue valacyclovir 

## 2022-08-09 NOTE — Assessment & Plan Note (Signed)
Okay to take Tylenol OTC as needed ?

## 2022-08-09 NOTE — Assessment & Plan Note (Signed)
Stable on her current dose of fluoxetine Support offered 

## 2022-08-18 ENCOUNTER — Other Ambulatory Visit: Payer: Self-pay | Admitting: Internal Medicine

## 2022-08-18 NOTE — Telephone Encounter (Signed)
Requested medication (s) are due for refill today - yes  Requested medication (s) are on the active medication list -yes  Future visit scheduled -no  Last refill: 06/05/22 #90, #270  Hattie Perch to clinic: Both medications filled 06/05/22 for 90 day supply, Home visit- 4/ with recommended f/u 6 months- sent for review of request  Requested Prescriptions  Pending Prescriptions Disp Refills   FLUoxetine (PROZAC) 20 MG capsule [Pharmacy Med Name: FLUOXETINE HYDROCHLORIDE 20 MG Capsule] 270 capsule 3    Sig: TAKE 3 CAPSULES EVERY MORNING     Psychiatry:  Antidepressants - SSRI Failed - 08/18/2022  2:47 AM      Failed - Valid encounter within last 6 months    Recent Outpatient Visits           1 year ago Epigastric pain   [place] [facility] [place], [place] W, NP   1 year ago Aortic atherosclerosis [facility])   [place] [facility] [place], Salvadore [place], NP              Passed - Completed PHQ-2 or PHQ-9 in the last 360 days       calcitRIOL (ROCALTROL) 0.25 MCG capsule [Pharmacy Med Name: CALCITRIOL 0.25 MCG Capsule] 90 capsule 3    Sig: TAKE 1 CAPSULE EVERY DAY     Endocrinology:  Vitamins - Vitamin D Supplementation - calcitriol Failed - 08/18/2022  2:47 AM      Failed - Phosphate in normal range and within 360 days    No results found for: "PHOS"       Failed - PTH in normal range and within 360 days    No results found for: "IOPTH", "PTHINTACTFNA", "PTH"       Failed - Ca in normal range and within 360 days    Calcium  Date Value Ref Range Status  09/22/2021 10.7 (H) 8.6 - 10.4 mg/dL Final         Passed - Valid encounter within last 12 months    Recent Outpatient Visits           1 year ago Epigastric pain   [place] [facility] [place], Salvadore Oxford, NP   1 year ago Aortic atherosclerosis [facility])   [place] [facility] [place], Salvadore Oxford, NP                 Requested Prescriptions  Pending Prescriptions  Disp Refills   FLUoxetine (PROZAC) 20 MG capsule [Pharmacy Med Name: FLUOXETINE HYDROCHLORIDE 20 MG Capsule] 270 capsule 3    Sig: TAKE 3 CAPSULES EVERY MORNING     Psychiatry:  Antidepressants - SSRI Failed - 08/18/2022  2:47 AM      Failed - Valid encounter within last 6 months    Recent Outpatient Visits           1 year ago Epigastric pain    [facility] [place], Salvadore Oxford, NP   1 year ago Aortic atherosclerosis [facility])    [facility] [place], [place], NP              Passed - Completed PHQ-2 or PHQ-9 in the last 360 days       calcitRIOL (ROCALTROL) 0.25 MCG capsule [Pharmacy Med Name: CALCITRIOL 0.25 MCG Capsule] 90 capsule 3    Sig: TAKE 1 CAPSULE EVERY DAY     Endocrinology:  Vitamins - Vitamin D Supplementation - calcitriol Failed -  08/18/2022  2:47 AM      Failed - Phosphate in normal range and within 360 days    No results found for: "PHOS"       Failed - PTH in normal range and within 360 days    No results found for: "IOPTH", "PTHINTACTFNA", "PTH"       Failed - Ca in normal range and within 360 days    Calcium  Date Value Ref Range Status  09/22/2021 10.7 (H) 8.6 - 10.4 mg/dL Final         Passed - Valid encounter within last 12 months    Recent Outpatient Visits           1 year ago Epigastric pain   [place] [facility] [place], Salvadore Oxford, NP   1 year ago Aortic atherosclerosis [facility])   [place] [facility] [place], Salvadore [place], [place]

## 2022-10-06 ENCOUNTER — Ambulatory Visit (INDEPENDENT_AMBULATORY_CARE_PROVIDER_SITE_OTHER): Payer: Medicare PPO

## 2022-10-06 VITALS — Wt 164.0 lb

## 2022-10-06 DIAGNOSIS — Z0000 Encounter for general adult medical examination without abnormal findings: Secondary | ICD-10-CM

## 2022-10-06 NOTE — Progress Notes (Signed)
I connected with  Ann Russell on 10/06/22 by a audio enabled telemedicine application and verified that I am speaking with the correct person using two identifiers.  Patient Location: Home  Provider Location: Office/Clinic  I discussed the limitations of evaluation and management by telemedicine. The patient expressed understanding and agreed to proceed.  Subjective:   Ann Russell is a 82 y.o. female who presents for Medicare Annual (Subsequent) preventive examination.  Review of Systems     Cardiac Risk Factors include: advanced age (>34men, >58 women);dyslipidemia     Objective:    Today's Vitals   10/06/22 1154  Weight: 164 lb (74.4 kg)   Body mass index is 24.94 kg/m.     10/06/2022   11:35 AM 07/18/2021    2:21 PM 07/03/2021    7:16 PM 06/29/2021   10:49 AM 06/26/2021    8:07 PM 06/25/2021   10:25 PM 06/20/2021   10:00 PM  Advanced Directives  Does Patient Have a Medical Advance Directive? No No  No Yes No Yes  Type of Agricultural consultant;Living will  Healthcare Power of [place];Living will  Does patient want to make changes to medical advance directive?     No - Patient declined    Would patient like information on creating a medical advance directive? No - Patient declined  No - Patient declined        Current Medications (verified) Outpatient Encounter Medications as of 10/06/2022  Medication Sig   acetaminophen (TYLENOL) 650 MG CR tablet Take 650 mg by mouth 2 (two) times a day.   B COMPLEX VITAMINS SL Place 1 tablet under the tongue daily.   busPIRone (BUSPAR) 15 MG tablet TAKE 1 TABLET TWICE DAILY   calcitRIOL (ROCALTROL) 0.25 MCG capsule TAKE 1 CAPSULE EVERY DAY   ferrous fumarate-b12-vitamic C-folic acid (TRINSICON / FOLTRIN) capsule Take 1 capsule by mouth 2 (two) times daily.   FLUoxetine (PROZAC) 20 MG capsule TAKE 3 CAPSULES EVERY MORNING   Multiple Vitamin (MULTI-VITAMINS) TABS Take 2 tablets by mouth 2 (two) times daily.    oxybutynin (DITROPAN-XL) 5 MG 24 hr tablet TAKE 1 TABLET (5 MG TOTAL) BY MOUTH AT BEDTIME.   Pyridoxine HCl (VITAMIN B6 PO) Take 1 tablet by mouth daily. With b12   valACYclovir (VALTREX) 1000 MG tablet Take 1 tablet (1,000 mg total) by mouth daily as needed (fever blisters).   Calcium Carb-Cholecalciferol (CALCIUM 600 + D PO) Take 1 tablet by mouth daily.   denosumab (PROLIA) 60 MG/ML SOSY injection Inject 60 mg into the skin every 6 (six) months. (Patient not taking: Reported on 10/06/2022)   midodrine (PROAMATINE) 5 MG tablet Take 1 tablet (5 mg total) by mouth 2 (two) times daily with a meal. (Patient not taking: Reported on 10/06/2022)   omeprazole (PRILOSEC) 40 MG capsule TAKE 1 CAPSULE EVERY DAY (Patient not taking: Reported on 10/06/2022)   predniSONE (DELTASONE) 10 MG tablet Take 6 tabs on day 1, 5 tabs on day 2, 4 tabs on day 3, 3 tabs on day 4, 2 tabs on day 5, 1 tab on day 6 (Patient not taking: Reported on 10/06/2022)   pregabalin (LYRICA) 100 MG capsule Take 1 capsule (100 mg total) by mouth 3 (three) times daily.   No facility-administered encounter medications on file as of 10/06/2022.    Allergies (verified) Patient has no known allergies.   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  Takes Vasotec for kidneys.-stage 3   Depression    Hyperlipemia    Hypertension    h/o had gastric bypass and was able to come off meds   Neuropathy    Pulmonary embolism (HCC)    Sleep apnea    no cpap since her gastric bypass   Past Surgical History:  Procedure Laterality Date   ABDOMINAL HYSTERECTOMY     Buniectomy     bil   CHOLECYSTECTOMY     ESOPHAGOGASTRODUODENOSCOPY (EGD) WITH PROPOFOL N/A 06/29/2021   Procedure: ESOPHAGOGASTRODUODENOSCOPY (EGD) WITH PROPOFOL;  Surgeon: Wyline Mood, MD;  Location: [facility] ENDOSCOPY;  Service: Gastroenterology;  Laterality: N/A;    FACIAL COSMETIC SURGERY  2011   GASTRIC BYPASS  2010   IR IVC FILTER PLMT / S&I /IMG GUID/MOD SED  07/01/2021   PULMONARY THROMBECTOMY Bilateral 06/22/2017   Procedure: PULMONARY THROMBECTOMY;  Surgeon: Annice Needy, MD;  Location: [facility] INVASIVE CV LAB;  Service: Cardiovascular;  Laterality: Bilateral;   Rotor cuff     right   SCLERAL BUCKLE  11/07/2011   Procedure: SCLERAL BUCKLE;  Surgeon: Sherrie George, MD;  Location: [facility] OR;  Service: Ophthalmology;  Laterality: Left;   TOTAL KNEE ARTHROPLASTY Right 12/03/2018   Procedure: RIGHT TOTAL KNEE ARTHROPLASTY;  Surgeon: Kennedy Bucker, MD;  Location: [facility] ORS;  Service: Orthopedics;  Laterality: Right;   Family History  Problem Relation Age of Onset   Heart disease Mother    Dementia Mother    Neuropathy Father    Diabetes Brother    Breast cancer Neg Hx    Social History   Socioeconomic History   Marital status: Significant Other    Spouse name: Not on file   Number of children: 1   Years of education: Not on file   Highest education level: Not on file  Occupational History   Occupation: retired  Tobacco Use   Smoking status: Never   Smokeless tobacco: Never  Vaping Use   Vaping Use: Never used  Substance and Sexual Activity   Alcohol use: No   Drug use: No   Sexual activity: Not Currently  Other Topics Concern   Not on file  Social History Narrative   Not on file   Social Determinants of Health   Financial Resource Strain: Low Risk  (10/06/2022)   Overall Financial Resource Strain (CARDIA)    Difficulty of Paying Living Expenses: Not hard at all  Food Insecurity: No Food Insecurity (10/06/2022)   Hunger Vital Sign    Worried About Running Out of Food in the Last Year: Never true    Ran Out of Food in the Last Year: Never true  Transportation Needs: No Transportation Needs (10/06/2022)   PRAPARE - Administrator, Civil Service (Medical): No    Lack of Transportation (Non-Medical): No  Physical Activity:  Inactive (10/06/2022)   Exercise Vital Sign    Days of Exercise per Week: 0 days    Minutes of Exercise per Session: 0 min  Stress: No Stress Concern Present (10/06/2022)   [employer] of Occupational Health - Occupational Stress Questionnaire    Feeling of Stress : Only a little  Social Connections: Moderately Isolated (10/06/2022)   Social Connection and Isolation Panel [NHANES]    Frequency of Communication with Friends and Family: More than three times a week  Frequency of Social Gatherings with Friends and Family: More than three times a week    Attends Religious Services: Never    Database administrator or Organizations: No    Attends Engineer, structural: Never    Marital Status: Living with partner    Tobacco Counseling Counseling given: Not Answered   Clinical Intake:  Pre-visit preparation completed: Yes  Pain : No/denies pain     Nutritional Risks: None Diabetes: No  How often do you need to have someone help you when you read instructions, pamphlets, or other written materials from your doctor or pharmacy?: 1 - Never  Diabetic?no  Interpreter Needed?: No  Information entered by :: Kennedy Bucker, LPN   Activities of Daily Living    10/06/2022   11:36 AM  In your present state of health, do you have any difficulty performing the following activities:  Hearing? 0  Vision? 0  Difficulty concentrating or making decisions? 0  Walking or climbing stairs? 1  Dressing or bathing? 1  Doing errands, shopping? 1  Preparing Food and eating ? Y  Using the Toilet? Y  In the past six months, have you accidently leaked urine? N  Do you have problems with loss of bowel control? N  Managing your Medications? Y  Managing your Finances? Y  Housekeeping or managing your Housekeeping? Y    Patient Care Team: Lorre Munroe, NP as PCP - General (Internal Medicine) Lyndle Herrlich, MD as Consulting Physician (Orthopedic Surgery)  Indicate any recent  Medical Services you may have received from other than [facility] providers in the past year (date may be approximate).     Assessment:   This is a routine wellness examination for Brayden.  Hearing/Vision screen Hearing Screening - Comments:: No aids Vision Screening - Comments:: Wears glasses-   Dietary issues and exercise activities discussed: Current Exercise Habits: The patient does not participate in regular exercise at present   Goals Addressed             This Visit's Progress    DIET - EAT MORE FRUITS AND VEGETABLES         Depression Screen    10/06/2022   11:33 AM 09/29/2021    1:53 PM 02/03/2021    3:05 PM  PHQ 2/9 Scores  PHQ - 2 Score 4 0 0  PHQ- 9 Score 5      Fall Risk    10/06/2022   11:35 AM 09/29/2021    1:56 PM  Fall Risk   Falls in the past year? 1 1  Number falls in past yr: 0 0  Injury with Fall? 0 0  Risk for fall due to : No Fall Risks;History of fall(s);Impaired mobility Impaired mobility;Impaired balance/gait  Follow up Falls prevention discussed;Falls evaluation completed Falls evaluation completed    FALL RISK PREVENTION PERTAINING TO THE HOME:  Any stairs in or around the home? No  If so, are there any without handrails? No  Home free of loose throw rugs in walkways, pet beds, electrical cords, etc? Yes  Adequate lighting in your home to reduce risk of falls? Yes   ASSISTIVE DEVICES UTILIZED TO PREVENT FALLS:  Life alert? No  Use of a cane, walker or w/c? Yes w/c, scooter Grab bars in the bathroom? No  Shower chair or bench in shower? No  Elevated toilet seat or a handicapped toilet? No   Cognitive Function:        10/06/2022   11:40 AM  09/29/2021    1:58 PM  6CIT Screen  What Year? 0 points 0 points  What month? 0 points 0 points  What time? 0 points 0 points  Count back from 20 0 points 0 points  Months in reverse 4 points 2 points  Repeat phrase 10 points 10 points  Total Score 14 points 12 points     Immunizations Immunization History  Administered Date(s) Administered   Fluad Quad(high Dose 65+) 02/03/2021   Influenza Inj Mdck Quad Pf 02/21/2018   Influenza, High Dose Seasonal PF 05/25/2016   Influenza,inj,Quad PF,6+ Mos 01/23/2020   Influenza-Unspecified 02/07/2017   PFIZER Comirnaty(Gray Top)Covid-19 Tri-Sucrose Vaccine 07/23/2019, 08/06/2019    TDAP status: Due, Education has been provided regarding the importance of this vaccine. Advised may receive this vaccine at local pharmacy or Health Dept. Aware to provide a copy of the vaccination record if obtained from local pharmacy or Health Dept. Verbalized acceptance and understanding.  Flu Vaccine status: Declined, Education has been provided regarding the importance of this vaccine but patient still declined. Advised may receive this vaccine at local pharmacy or Health Dept. Aware to provide a copy of the vaccination record if obtained from local pharmacy or Health Dept. Verbalized acceptance and understanding.  Pneumococcal vaccine status: Declined,  Education has been provided regarding the importance of this vaccine but patient still declined. Advised may receive this vaccine at local pharmacy or Health Dept. Aware to provide a copy of the vaccination record if obtained from local pharmacy or Health Dept. Verbalized acceptance and understanding.   Covid-19 vaccine status: Completed vaccines  Qualifies for Shingles Vaccine? Yes   Zostavax completed No   Shingrix Completed?: No.    Education has been provided regarding the importance of this vaccine. Patient has been advised to call insurance company to determine out of pocket expense if they have not yet received this vaccine. Advised may also receive vaccine at local pharmacy or Health Dept. Verbalized acceptance and understanding.  Screening Tests Health Maintenance  Topic Date Due   DTaP/Tdap/Td (1 - Tdap) Never done   Zoster Vaccines- Shingrix (1 of 2) Never done    Pneumonia Vaccine 18+ Years old (1 of 1 - PCV) Never done   COVID-19 Vaccine (3 - Pfizer risk series) 09/03/2019   INFLUENZA VACCINE  11/23/2022   Medicare Annual Wellness (AWV)  10/06/2023   DEXA SCAN  Completed   HPV VACCINES  Aged Out    Health Maintenance  Health Maintenance Due  Topic Date Due   DTaP/Tdap/Td (1 - Tdap) Never done   Zoster Vaccines- Shingrix (1 of 2) Never done   Pneumonia Vaccine 54+ Years old (1 of 1 - PCV) Never done   COVID-19 Vaccine (3 - Pfizer risk series) 09/03/2019    Colorectal cancer screening: No longer required.   Mammogram status: No longer required due to age.  [facility] referral declined  Lung Cancer Screening: (Low Dose CT Chest recommended if Age 64-80 years, 30 pack-year currently smoking OR have quit w/in 15years.) does not qualify.    Additional Screening:  Hepatitis C Screening: does not qualify; Completed no  Vision Screening: Recommended annual ophthalmology exams for early detection of glaucoma and other disorders of the eye. Is the patient up to date with their annual eye exam?  No  Who is the provider or what is the name of the office in which the patient attends annual eye exams? No one If pt is not established with a provider, would they like to  be referred to a provider to establish care? No .   Dental Screening: Recommended annual dental exams for proper oral hygiene  Community Resource Referral / Chronic Care Management: CRR required this visit?  No   CCM required this visit?  No      Plan:     I have personally reviewed and noted the following in the patient's chart:   Medical and social history Use of alcohol, tobacco or illicit drugs  Current medications and supplements including opioid prescriptions. Patient is not currently taking opioid prescriptions. Functional ability and status Nutritional status Physical activity Advanced directives List of other physicians Hospitalizations, surgeries, and ER visits in  previous 12 months Vitals Screenings to include cognitive, depression, and falls Referrals and appointments  In addition, I have reviewed and discussed with patient certain preventive protocols, quality metrics, and best practice recommendations. A written personalized care plan for preventive services as well as general preventive health recommendations were provided to patient.     Hal Hope, LPN   1/61/0960   Nurse Notes: none

## 2022-10-06 NOTE — Patient Instructions (Signed)
[name] , Thank you for taking time to come for your Medicare Wellness Visit. I appreciate your ongoing commitment to your health goals. Please review the following plan we discussed and let me know if I can assist you in the future.   These are the goals we discussed:  Goals      DIET - EAT MORE FRUITS AND VEGETABLES        This is a list of the screening recommended for you and due dates:  Health Maintenance  Topic Date Due   DTaP/Tdap/Td vaccine (1 - Tdap) Never done   Zoster (Shingles) Vaccine (1 of 2) Never done   Pneumonia Vaccine (1 of 1 - PCV) Never done   COVID-19 Vaccine (3 - Pfizer risk series) 09/03/2019   Flu Shot  11/23/2022   Medicare Annual Wellness Visit  10/06/2023   DEXA scan (bone density measurement)  Completed   HPV Vaccine  Aged Out    Advanced directives: no  Conditions/risks identified: none  Next appointment: Follow up in one year for your annual wellness visit 10/12/23 @ 9:15 am by phone   Preventive Care 65 Years and Older, Female Preventive care refers to lifestyle choices and visits with your health care provider that can promote health and wellness. What does preventive care include? A yearly physical exam. This is also called an annual well check. Dental exams once or twice a year. Routine eye exams. Ask your health care provider how often you should have your eyes checked. Personal lifestyle choices, including: Daily care of your teeth and gums. Regular physical activity. Eating a healthy diet. Avoiding tobacco and drug use. Limiting alcohol use. Practicing safe sex. Taking low-dose aspirin every day. Taking vitamin and mineral supplements as recommended by your health care provider. What happens during an annual well check? The services and screenings done by your health care provider during your annual well check will depend on your age, overall health, lifestyle risk factors, and family history of disease. Counseling  Your health  care provider may ask you questions about your: Alcohol use. Tobacco use. Drug use. Emotional well-being. Home and relationship well-being. Sexual activity. Eating habits. History of falls. Memory and ability to understand (cognition). Work and work Astronomer. Reproductive health. Screening  You may have the following tests or measurements: Height, weight, and BMI. Blood pressure. Lipid and cholesterol levels. These may be checked every 5 years, or more frequently if you are over 52 years old. Skin check. Lung cancer screening. You may have this screening every year starting at age 72 if you have a 30-pack-year history of smoking and currently smoke or have quit within the past 15 years. Fecal occult blood test (FOBT) of the stool. You may have this test every year starting at age 75. Flexible sigmoidoscopy or colonoscopy. You may have a sigmoidoscopy every 5 years or a colonoscopy every 10 years starting at age 51. Hepatitis C blood test. Hepatitis B blood test. Sexually transmitted disease (STD) testing. Diabetes screening. This is done by checking your blood sugar (glucose) after you have not eaten for a while (fasting). You may have this done every 1-3 years. Bone density scan. This is done to screen for osteoporosis. You may have this done starting at age 1. Mammogram. This may be done every 1-2 years. Talk to your health care provider about how often you should have regular mammograms. Talk with your health care provider about your test results, treatment options, and if necessary, the need for more  tests. Vaccines  Your health care provider may recommend certain vaccines, such as: Influenza vaccine. This is recommended every year. Tetanus, diphtheria, and acellular pertussis (Tdap, Td) vaccine. You may need a Td booster every 10 years. Zoster vaccine. You may need this after age 36. Pneumococcal 13-valent conjugate (PCV13) vaccine. One dose is recommended after age  73. Pneumococcal polysaccharide (PPSV23) vaccine. One dose is recommended after age 90. Talk to your health care provider about which screenings and vaccines you need and how often you need them. This information is not intended to replace advice given to you by your health care provider. Make sure you discuss any questions you have with your health care provider. Document Released: 05/07/2015 Document Revised: 12/29/2015 Document Reviewed: 02/09/2015 Elsevier Interactive Patient Education  2017 [place] Prevention in the Home Falls can cause injuries. They can happen to people of all ages. There are many things you can do to make your home safe and to help prevent falls. What can I do on the outside of my home? Regularly fix the edges of walkways and driveways and fix any cracks. Remove anything that might make you trip as you walk through a door, such as a raised step or threshold. Trim any bushes or trees on the path to your home. Use bright outdoor lighting. Clear any walking paths of anything that might make someone trip, such as rocks or tools. Regularly check to see if handrails are loose or broken. Make sure that both sides of any steps have handrails. Any raised decks and porches should have guardrails on the edges. Have any leaves, snow, or ice cleared regularly. Use sand or salt on walking paths during winter. Clean up any spills in your garage right away. This includes oil or grease spills. What can I do in the bathroom? Use night lights. Install grab bars by the toilet and in the tub and shower. Do not use towel bars as grab bars. Use non-skid mats or decals in the tub or shower. If you need to sit down in the shower, use a plastic, non-slip stool. Keep the floor dry. Clean up any water that spills on the floor as soon as it happens. Remove soap buildup in the tub or shower regularly. Attach bath mats securely with double-sided non-slip rug tape. Do not have throw  rugs and other things on the floor that can make you trip. What can I do in the bedroom? Use night lights. Make sure that you have a light by your bed that is easy to reach. Do not use any sheets or blankets that are too big for your bed. They should not hang down onto the floor. Have a firm chair that has side arms. You can use this for support while you get dressed. Do not have throw rugs and other things on the floor that can make you trip. What can I do in the kitchen? Clean up any spills right away. Avoid walking on wet floors. Keep items that you use a lot in easy-to-reach places. If you need to reach something above you, use a strong step stool that has a grab bar. Keep electrical cords out of the way. Do not use floor polish or wax that makes floors slippery. If you must use wax, use non-skid floor wax. Do not have throw rugs and other things on the floor that can make you trip. What can I do with my stairs? Do not leave any items on the stairs. Make sure  that there are handrails on both sides of the stairs and use them. Fix handrails that are broken or loose. Make sure that handrails are as long as the stairways. Check any carpeting to make sure that it is firmly attached to the stairs. Fix any carpet that is loose or worn. Avoid having throw rugs at the top or bottom of the stairs. If you do have throw rugs, attach them to the floor with carpet tape. Make sure that you have a light switch at the top of the stairs and the bottom of the stairs. If you do not have them, ask someone to add them for you. What else can I do to help prevent falls? Wear shoes that: Do not have high heels. Have rubber bottoms. Are comfortable and fit you well. Are closed at the toe. Do not wear sandals. If you use a stepladder: Make sure that it is fully opened. Do not climb a closed stepladder. Make sure that both sides of the stepladder are locked into place. Ask someone to hold it for you, if  possible. Clearly mark and make sure that you can see: Any grab bars or handrails. First and last steps. Where the edge of each step is. Use tools that help you move around (mobility aids) if they are needed. These include: Canes. Walkers. Scooters. Crutches. Turn on the lights when you go into a dark area. Replace any light bulbs as soon as they burn out. Set up your furniture so you have a clear path. Avoid moving your furniture around. If any of your floors are uneven, fix them. If there are any pets around you, be aware of where they are. Review your medicines with your doctor. Some medicines can make you feel dizzy. This can increase your chance of falling. Ask your doctor what other things that you can do to help prevent falls. This information is not intended to replace advice given to you by your health care provider. Make sure you discuss any questions you have with your health care provider. Document Released: 02/04/2009 Document Revised: 09/16/2015 Document Reviewed: 05/15/2014 Elsevier Interactive Patient Education  2017 [employer].

## 2022-10-11 ENCOUNTER — Other Ambulatory Visit: Payer: Medicare PPO | Admitting: Internal Medicine

## 2022-10-11 DIAGNOSIS — F32A Depression, unspecified: Secondary | ICD-10-CM

## 2022-10-11 DIAGNOSIS — F419 Anxiety disorder, unspecified: Secondary | ICD-10-CM

## 2022-10-11 MED ORDER — BUPROPION HCL ER (XL) 150 MG PO TB24: 150.0000 mg | ORAL_TABLET | Freq: Every day | ORAL | 1 refills | Status: AC

## 2022-10-11 NOTE — Progress Notes (Signed)
Subjective:    Patient ID: Ann Russell, female    DOB: 08/08/1940, 82 y.o.   MRN: [id number]  HPI  Patient seen in her home with her significant other.  She wants to discuss persistent anxiety and depression.  This is currently managed on fluoxetine 60 mg daily and buspirone 15 mg twice daily.  She does feel down and depressed, nervous and anxious at times.  She is homebound and is unable to care for herself.  She is not currently seeing a therapist.  She denies SI/HI.  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  Takes Vasotec for kidneys.-stage 3   Depression    Hyperlipemia    Hypertension    h/o had gastric bypass and was able to come off meds   Neuropathy    Pulmonary embolism (HCC)    Sleep apnea    no cpap since her gastric bypass    Current Outpatient Medications  Medication Sig Dispense Refill   buPROPion (WELLBUTRIN XL) 150 MG 24 hr tablet Take 1 tablet (150 mg total) by mouth daily. 90 tablet 1   acetaminophen (TYLENOL) 650 MG CR tablet Take 650 mg by mouth 2 (two) times a day.     B COMPLEX VITAMINS SL Place 1 tablet under the tongue daily.     busPIRone (BUSPAR) 15 MG tablet TAKE 1 TABLET TWICE DAILY 180 tablet 2   calcitRIOL (ROCALTROL) 0.25 MCG capsule TAKE 1 CAPSULE EVERY DAY 90 capsule 1   Calcium Carb-Cholecalciferol (CALCIUM 600 + D PO) Take 1 tablet by mouth daily.     denosumab (PROLIA) 60 MG/ML SOSY injection Inject 60 mg into the skin every 6 (six) months. (Patient not taking: Reported on 10/06/2022)     ferrous fumarate-b12-vitamic C-folic acid (TRINSICON / FOLTRIN) capsule Take 1 capsule by mouth 2 (two) times daily. 20 capsule    FLUoxetine (PROZAC) 20 MG capsule TAKE 3 CAPSULES EVERY MORNING 270 capsule 1   midodrine (PROAMATINE) 5 MG tablet Take 1 tablet (5 mg total) by mouth 2 (two) times daily with a meal. (Patient not  taking: Reported on 10/06/2022) 60 tablet 0   Multiple Vitamin (MULTI-VITAMINS) TABS Take 2 tablets by mouth 2 (two) times daily.     omeprazole (PRILOSEC) 40 MG capsule TAKE 1 CAPSULE EVERY DAY (Patient not taking: Reported on 10/06/2022) 90 capsule 1   oxybutynin (DITROPAN-XL) 5 MG 24 hr tablet TAKE 1 TABLET (5 MG TOTAL) BY MOUTH AT BEDTIME. 90 tablet 1   predniSONE (DELTASONE) 10 MG tablet Take 6 tabs on day 1, 5 tabs on day 2, 4 tabs on day 3, 3 tabs on day 4, 2 tabs on day 5, 1 tab on day 6 (Patient not taking: Reported on 10/06/2022) 21 tablet 0   pregabalin (LYRICA) 100 MG capsule Take 1 capsule (100 mg total) by mouth 3 (three) times daily. 270 capsule 0   Pyridoxine HCl (VITAMIN B6 PO) Take 1 tablet by mouth daily. With b12     valACYclovir (VALTREX) 1000 MG tablet Take 1 tablet (1,000 mg total) by mouth daily as needed (fever blisters). 30 tablet 2   No current facility-administered medications for this visit.    No Known Allergies  Family History  Problem Relation Age of Onset   Heart disease Mother  Dementia Mother    Neuropathy Father    Diabetes Brother    Breast cancer Neg Hx     Social History   Socioeconomic History   Marital status: Significant Other    Spouse name: Not on file   Number of children: 1   Years of education: Not on file   Highest education level: Not on file  Occupational History   Occupation: retired  Tobacco Use   Smoking status: Never   Smokeless tobacco: Never  Vaping Use   Vaping Use: Never used  Substance and Sexual Activity   Alcohol use: No   Drug use: No   Sexual activity: Not Currently  Other Topics Concern   Not on file  Social History Narrative   Not on file   Social Determinants of Health   Financial Resource Strain: Low Risk  (10/06/2022)   Overall Financial Resource Strain (CARDIA)    Difficulty of Paying Living Expenses: Not hard at all  Food Insecurity: No Food Insecurity (10/06/2022)   Hunger Vital Sign    Worried  About Running Out of Food in the Last Year: Never true    Ran Out of Food in the Last Year: Never true  Transportation Needs: No Transportation Needs (10/06/2022)   PRAPARE - Administrator, Civil Service (Medical): No    Lack of Transportation (Non-Medical): No  Physical Activity: Inactive (10/06/2022)   Exercise Vital Sign    Days of Exercise per Week: 0 days    Minutes of Exercise per Session: 0 min  Stress: No Stress Concern Present (10/06/2022)   [employer] of Occupational Health - Occupational Stress Questionnaire    Feeling of Stress : Only a little  Social Connections: Moderately Isolated (10/06/2022)   Social Connection and Isolation Panel [NHANES]    Frequency of Communication with Friends and Family: More than three times a week    Frequency of Social Gatherings with Friends and Family: More than three times a week    Attends Religious Services: Never    Database administrator or Organizations: No    Attends Banker Meetings: Never    Marital Status: Living with partner  Intimate Partner Violence: Not At Risk (10/06/2022)   Humiliation, Afraid, Rape, and Kick questionnaire    Fear of Current or Ex-Partner: No    Emotionally Abused: No    Physically Abused: No    Sexually Abused: No     Constitutional: Denies fever, malaise, fatigue, headache or abrupt weight changes.  Respiratory: Denies difficulty breathing, shortness of breath, cough or sputum production.   Cardiovascular: Denies chest pain, chest tightness, palpitations or swelling in the hands or feet.  Gastrointestinal: Denies abdominal pain, bloating, constipation, diarrhea or blood in the stool.  GU: Denies urgency, frequency, pain with urination, burning sensation, blood in urine, odor or discharge. Musculoskeletal: Patient reports difficulty with gait, frequent falls.  Denies decrease in range of motion,  muscle pain or joint pain and swelling.  Skin: Denies redness, rashes, lesions or  ulcercations.  Neurological: Patient reports difficulty with balance, neuropathic pain..  Denies dizziness, difficulty with memory, difficulty with speech or problems with coordination.  Psych: Patient reports anxiety and depression.  Denies SI/HI.  No other specific complaints in a complete review of systems (except as listed in HPI above).  Objective:   Physical Exam   There were no vitals taken for this visit. Wt Readings from Last 3 Encounters:  10/06/22 164 lb (74.4 kg)  07/18/21  164 lb 7.4 oz (74.6 kg)  06/26/21 164 lb 7.4 oz (74.6 kg)    General: Appears her stated age, well developed, well nourished in NAD. Cardiovascular: Normal rate and rhythm. S1,S2 noted.  No murmur, rubs or gallops noted. No JVD or BLE edema.  Pulmonary/Chest: Normal effort and positive vesicular breath sounds. No respiratory distress. No wheezes, rales or ronchi noted.  Abdomen: Soft and nontender. Normal bowel sounds.  Musculoskeletal: In recliner chair.  Bilateral foot drop noted. Neurological: Alert and oriented.  Psychiatric: Mood and affect normal. Behavior is normal. Judgment and thought content normal.    BMET    Component Value Date/Time   NA 140 09/22/2021 0944   K 3.7 09/22/2021 0944   CL 103 09/22/2021 0944   CO2 25 09/22/2021 0944   GLUCOSE 86 09/22/2021 0944   BUN 23 09/22/2021 0944   CREATININE 1.33 (H) 09/22/2021 0944   CALCIUM 10.7 (H) 09/22/2021 0944   GFRNONAA 36 (L) 07/18/2021 1536   GFRAA 30 (L) 09/20/2019 1418    Lipid Panel     Component Value Date/Time   CHOL 195 09/22/2021 0944   TRIG 99 09/22/2021 0944   HDL 89 09/22/2021 0944   CHOLHDL 2.2 09/22/2021 0944   LDLCALC 87 09/22/2021 0944    CBC    Component Value Date/Time   WBC 6.9 09/22/2021 0944   RBC 4.47 09/22/2021 0944   HGB 13.7 09/22/2021 0944   HCT 43.0 09/22/2021 0944   PLT 264 09/22/2021 0944   MCV 96.2 09/22/2021 0944   MCH 30.6 09/22/2021 0944   MCHC 31.9 (L) 09/22/2021 0944   RDW 15.5 (H)  09/22/2021 0944   LYMPHSABS 1,842 09/22/2021 0944   MONOABS 0.5 06/23/2021 1714   EOSABS 21 09/22/2021 0944   BASOSABS 41 09/22/2021 0944    Hgb A1C No results found for: "HGBA1C"         Assessment & Plan:     Will return in 4 months for follow-up of chronic conditions Nicki Reaper, NP

## 2022-10-12 ENCOUNTER — Encounter: Payer: Self-pay | Admitting: Internal Medicine

## 2022-10-12 NOTE — Assessment & Plan Note (Signed)
Continue fluoxetine and buspirone Will add bupropion 150 mg XL daily

## 2022-10-12 NOTE — Patient Instructions (Signed)

## 2022-10-16 ENCOUNTER — Ambulatory Visit: Payer: Self-pay

## 2022-10-16 NOTE — Telephone Encounter (Signed)
  Chief Complaint: Only wants to discuss with Rene Kocher Symptoms: Trouble getting up - legs not working Frequency: ongoing - worsening Pertinent Negatives: Patient denies  Disposition: [] ED /[] Urgent Care (no appt availability in office) / [] Appointment(In office/virtual)/ []  [facility]/ [] Home Care/ [] Refused Recommended Disposition /[] [facility]/ [x]  Follow-up with PCP Additional Notes: Returned Mr. Shoffner's call, on DPR. He would like a call back from [facility] regarding pt's issue. He does not want to speak with triage nurse. He does state that pt is having trouble with her legs and trouble getting up. He would like a call back.     Summary: Caller did not want to elaborate   Spouse did not want to elaborate and requested to speak with PCP or PCP nurse only.       Reason for Disposition  [1] Caller requests to speak ONLY to PCP AND [2] URGENT question  Answer Assessment - Initial Assessment Questions 1. REASON FOR CALL or QUESTION: "What is your reason for calling today?" or "How can I best help you?" or "What question do you have that I can help answer?"     Pt's significant other wishes to speak with provider. 2. CALLER: Document the source of call. (e.g., laboratory, patient).     Pt's significant other.  Protocols used: PCP Call - No Triage-A-AH

## 2022-10-17 NOTE — Telephone Encounter (Signed)
Called Ann Russell to discuss his concerns. All questions answered.

## 2022-10-30 ENCOUNTER — Other Ambulatory Visit: Payer: Self-pay | Admitting: Internal Medicine

## 2022-10-30 NOTE — Telephone Encounter (Signed)
Requested Prescriptions  Pending Prescriptions Disp Refills   omeprazole (PRILOSEC) 40 MG capsule [Pharmacy Med Name: OMEPRAZOLE 40 MG Capsule Delayed Release] 90 capsule 0    Sig: TAKE 1 CAPSULE EVERY DAY     Gastroenterology: Proton Pump Inhibitors Passed - 10/30/2022  3:11 AM      Passed - Valid encounter within last 12 months    Recent Outpatient Visits           1 year ago Epigastric pain   [place] [facility] [place], Salvadore Oxford, NP   1 year ago Aortic atherosclerosis [facility])   [place] [facility] [place], [place] W, NP               oxybutynin (DITROPAN-XL) 5 MG 24 hr tablet [Pharmacy Med Name: OXYBUTYNIN CHLORIDE ER 5 MG Tablet Extended Release 24 Hour] 90 tablet 0    Sig: TAKE 1 TABLET AT BEDTIME     Urology:  Bladder Agents Passed - 10/30/2022  3:11 AM      Passed - Valid encounter within last 12 months    Recent Outpatient Visits           1 year ago Epigastric pain   [place], Salvadore Oxford, NP   1 year ago Aortic atherosclerosis [facility])   [place] [facility] [place], Salvadore Oxford, [place]

## 2023-01-11 ENCOUNTER — Other Ambulatory Visit: Payer: Self-pay | Admitting: Internal Medicine

## 2023-01-12 NOTE — Telephone Encounter (Signed)
Requested Prescriptions  Pending Prescriptions Disp Refills   calcitRIOL (ROCALTROL) 0.25 MCG capsule [Pharmacy Med Name: Calcitriol Oral Capsule 0.25 MCG] 90 capsule 3    Sig: TAKE 1 CAPSULE EVERY DAY     Endocrinology:  Vitamins - Vitamin D Supplementation - calcitriol Failed - 01/11/2023  2:37 AM      Failed - Phosphate in normal range and within 360 days    No results found for: "PHOS"       Failed - PTH in normal range and within 360 days    No results found for: "IOPTH", "PTHINTACTFNA", "PTH"       Failed - Ca in normal range and within 360 days    Calcium  Date Value Ref Range Status  09/22/2021 10.7 (H) 8.6 - 10.4 mg/dL Final         Passed - Valid encounter within last 12 months    Recent Outpatient Visits           1 year ago Epigastric pain   [place] [facility] [place], Salvadore Oxford, NP   1 year ago Aortic atherosclerosis [facility])   [place] [facility] [place], [place] W, NP               FLUoxetine (PROZAC) 20 MG capsule [Pharmacy Med Name: FLUoxetine HCl Oral Capsule 20 MG] 270 capsule 3    Sig: TAKE 3 CAPSULES EVERY MORNING     Psychiatry:  Antidepressants - SSRI Passed - 01/11/2023  2:37 AM      Passed - Completed PHQ-2 or PHQ-9 in the last 360 days      Passed - Valid encounter within last 6 months    Recent Outpatient Visits           1 year ago Epigastric pain   [place] [facility] [place], Salvadore Oxford, NP   1 year ago Aortic atherosclerosis [place])   [place] [facility] [place], [place] W, NP               oxybutynin (DITROPAN-XL) 5 MG 24 hr tablet [Pharmacy Med Name: oxyBUTYnin Chloride ER Oral Tablet Extended Release 24 Hour 5 MG] 90 tablet 3    Sig: TAKE 1 TABLET AT BEDTIME     Urology:  Bladder Agents Passed - 01/11/2023  2:37 AM      Passed - Valid encounter within last 12 months    Recent Outpatient Visits           1 year ago Epigastric pain   [place] [facility] [place], Salvadore Oxford, NP   1 year ago Aortic atherosclerosis [facility])   [facility] [place], Salvadore Oxford, NP               busPIRone (BUSPAR) 15 MG tablet [Pharmacy Med Name: busPIRone HCl Oral Tablet 15 MG] 180 tablet 3    Sig: TAKE 1 TABLET TWICE DAILY     Psychiatry: Anxiolytics/Hypnotics - Non-controlled Passed - 01/11/2023  2:37 AM      Passed - Valid encounter within last 12 months    Recent Outpatient Visits           1 year ago Epigastric pain   [place] [facility], Salvad[facility] Oxford, NP   1 year ago Aortic atherosclerosis [facility])   [place] [facility] [place], Salvadore Oxford, [place]  omeprazole (PRILOSEC) 40 MG capsule [Pharmacy Med Name: Omeprazole Oral Capsule Delayed Release 40 MG] 90 capsule 3    Sig: TAKE 1 CAPSULE EVERY DAY     Gastroenterology: Proton Pump Inhibitors Passed - 01/11/2023  2:37 AM      Passed - Valid encounter within last 12 months    Recent Outpatient Visits           1 year ago Epigastric pain   [place] [facility] [place], Salvadore Oxford, NP   1 year ago Aortic atherosclerosis [facility])   [place] [facility], Salvadore [place], [place]

## 2023-01-12 NOTE — Telephone Encounter (Signed)
Requested medications are due for refill today.  yes  Requested medications are on the active medications list.  yes  Last refill. 08/18/2022 #90 1 rf  Future visit scheduled.   no  Notes to clinic.  Missing labs.    Requested Prescriptions  Pending Prescriptions Disp Refills   calcitRIOL (ROCALTROL) 0.25 MCG capsule [Pharmacy Med Name: Calcitriol Oral Capsule 0.25 MCG] 90 capsule 3    Sig: TAKE 1 CAPSULE EVERY DAY     Endocrinology:  Vitamins - Vitamin D Supplementation - calcitriol Failed - 01/11/2023  2:37 AM      Failed - Phosphate in normal range and within 360 days    No results found for: "PHOS"       Failed - PTH in normal range and within 360 days    No results found for: "IOPTH", "PTHINTACTFNA", "PTH"       Failed - Ca in normal range and within 360 days    Calcium  Date Value Ref Range Status  09/22/2021 10.7 (H) 8.6 - 10.4 mg/dL Final         Passed - Valid encounter within last 12 months    Recent Outpatient Visits           1 year ago Epigastric pain   [place] [place], Salvadore Oxford, NP   1 year ago Aortic atherosclerosis [facility])   [place] [facility] [place], Salvadore Oxford, NP              Signed Prescriptions Disp Refills   FLUoxetine (PROZAC) 20 MG capsule 270 capsule 0    Sig: TAKE 3 CAPSULES EVERY MORNING     Psychiatry:  Antidepressants - SSRI Passed - 01/11/2023  2:37 AM      Passed - Completed PHQ-2 or PHQ-9 in the last 360 days      Passed - Valid encounter within last 6 months    Recent Outpatient Visits           1 year ago Epigastric pain   [place] [facility] [place], Salvadore Oxford, NP   1 year ago [facility])   [place] [facility] [place], [place] W, NP               oxybutynin (DITROPAN-XL) 5 MG 24 hr tablet 90 tablet 2    Sig: TAKE 1 TABLET AT BEDTIME     Urology:  Bladder Agents Passed - 01/11/2023  2:37 AM      Passed - Valid  encounter within last 12 months    Recent Outpatient Visits           1 year ago Epigastric pain   [place] [facility] [place], Salvadore Oxford, NP   1 year ago Aortic atherosclerosis [facility])   [place] [facility] [place], [place], NP               busPIRone (BUSPAR) 15 MG tablet 180 tablet 2    Sig: TAKE 1 TABLET TWICE DAILY     Psychiatry: Anxiolytics/Hypnotics - Non-controlled Passed - 01/11/2023  2:37 AM      Passed - Valid encounter within last 12 months    Recent Outpatient Visits           1 year ago Epigastric pain   [place] [facility] [place], Salvadore Oxford, NP   1 year ago Aortic atherosclerosis [facility])     [facility] [place], [place], NP               omeprazole (PRILOSEC) 40 MG capsule 90 capsule 2    Sig: TAKE 1 CAPSULE EVERY DAY     Gastroenterology: Proton Pump Inhibitors Passed - 01/11/2023  2:37 AM      Passed - Valid encounter within last 12 months    Recent Outpatient Visits           1 year ago Epigastric pain   [facility] [place], Salvadore Oxford, NP   1 year ago Aortic atherosclerosis [facility])    [facility] [place], Salvad[place], [place]

## 2023-03-27 ENCOUNTER — Other Ambulatory Visit: Payer: Self-pay | Admitting: Internal Medicine

## 2023-03-29 NOTE — Telephone Encounter (Signed)
Requested medications are due for refill today.  yes  Requested medications are on the active medications list.  yes  Last refill. 01/12/2023 #270 0 rf  Future visit scheduled.   yes  Notes to clinic.  Provider last saw pt in April at home. Pt needs a follow up. Should pt come to office? Or home visit?     Requested Prescriptions  Pending Prescriptions Disp Refills   FLUoxetine (PROZAC) 20 MG capsule [Pharmacy Med Name: FLUoxetine HCl Oral Capsule 20 MG] 270 capsule 3    Sig: TAKE 3 CAPSULES EVERY MORNING     Psychiatry:  Antidepressants - SSRI Passed - 03/27/2023  1:23 PM      Passed - Completed PHQ-2 or PHQ-9 in the last 360 days      Passed - Valid encounter within last 6 months    Recent Outpatient Visits           1 year ago Epigastric pain   [place] [facility] [place], Salvadore Oxford, NP   2 years ago Aortic atherosclerosis [facility])   [place] [facility] [place], Salvadore [place], [place]

## 2023-06-07 ENCOUNTER — Other Ambulatory Visit: Payer: Self-pay | Admitting: Internal Medicine

## 2023-06-07 NOTE — Telephone Encounter (Signed)
Rx was sent to pharmacy on 03/29/23 #270/0. Pt is also due for OV  Requested Prescriptions  Refused Prescriptions Disp Refills   FLUoxetine (PROZAC) 20 MG capsule [Pharmacy Med Name: FLUoxetine HCl Oral Capsule 20 MG] 270 capsule 3    Sig: TAKE 3 CAPSULES EVERY MORNING     Psychiatry:  Antidepressants - SSRI Failed - 06/07/2023  3:45 PM      Failed - Valid encounter within last 6 months    Recent Outpatient Visits           1 year ago Epigastric pain   [place] [facility] [place], Salvadore Oxford, NP   2 years ago Aortic atherosclerosis [facility]    [facility], Salv[place], [place]              Passed - Completed PHQ-2 or PHQ-9 in the last 360 days

## 2023-08-19 ENCOUNTER — Other Ambulatory Visit: Payer: Self-pay | Admitting: Internal Medicine

## 2023-08-21 NOTE — Telephone Encounter (Signed)
 Last home visit was 10/11/22 Requested Prescriptions  Pending Prescriptions Disp Refills   oxybutynin  (DITROPAN -XL) 5 MG 24 hr tablet [Pharmacy Med Name: oxyBUTYnin  Chloride ER Oral Tablet Extended Release 24 Hour 5 MG] 90 tablet 3    Sig: TAKE 1 TABLET AT BEDTIME     Urology:  Bladder Agents Failed - 08/21/2023 12:29 PM      Failed - Valid encounter within last 12 months    Recent Outpatient Visits   None             omeprazole  (PRILOSEC) 40 MG capsule [Pharmacy Med Name: Omeprazole  Oral Capsule Delayed Release 40 MG] 90 capsule 3    Sig: TAKE 1 CAPSULE EVERY DAY     Gastroenterology: Proton Pump Inhibitors Failed - 08/21/2023 12:29 PM      Failed - Valid encounter within last 12 months    Recent Outpatient Visits   None             busPIRone  (BUSPAR ) 15 MG tablet [Pharmacy Med Name: busPIRone  HCl Oral Tablet 15 MG] 180 tablet 3    Sig: TAKE 1 TABLET TWICE DAILY     Psychiatry: Anxiolytics/Hypnotics - Non-controlled Failed - 08/21/2023 12:29 PM      Failed - Valid encounter within last 12 months    Recent Outpatient Visits   None

## 2023-10-12 ENCOUNTER — Ambulatory Visit: Payer: Medicare PPO

## 2023-10-12 DIAGNOSIS — Z0000 Encounter for general adult medical examination without abnormal findings: Secondary | ICD-10-CM | POA: Diagnosis not present

## 2023-10-12 NOTE — Patient Instructions (Addendum)
 Ann Russell , Thank you for taking time out of your busy schedule to complete your Annual Wellness Visit with me. I enjoyed our conversation and look forward to speaking with you again next year. I, as well as your care team,  appreciate your ongoing commitment to your health goals. Please review the following plan we discussed and let me know if I can assist you in the future.   Follow up Visits: Next Medicare AWV with our clinical staff:   10/17/24 @ 8:50 AM BY PHONE Have you seen your provider in the last 6 months (3 months if uncontrolled diabetes)? Yes   Clinician Recommendations:  Aim for 30 minutes of exercise or brisk walking, 6-8 glasses of water, and 5 servings of fruits and vegetables each day. TAKE CARE!      This is a list of the screening recommended for you and due dates:  Health Maintenance  Topic Date Due   DTaP/Tdap/Td vaccine (1 - Tdap) Never done   Pneumococcal Vaccine for age over 15 (1 of 2 - PCV) Never done   Zoster (Shingles) Vaccine (1 of 2) Never done   COVID-19 Vaccine (3 - Pfizer risk series) 09/03/2019   Flu Shot  11/23/2023   Medicare Annual Wellness Visit  10/11/2024   DEXA scan (bone density measurement)  Completed   HPV Vaccine  Aged Out   Meningitis B Vaccine  Aged Out    Advanced directives: (ACP Link)Information on Advanced Care Planning can be found at [place]  Secretary [facility] Advance Health Care Directives Advance Health Care Directives. [email or web address]  Advance Care Planning is important because it:  [x]  Makes sure you receive the medical care that is consistent with your values, goals, and preferences  [x]  It provides guidance to your family and loved ones and reduces their decisional burden about whether or not they are making the right decisions based on your wishes.  Follow the link provided in your after visit summary or read over the paperwork we have mailed to you to help you started getting your Advance Directives in place. If you need  assistance in completing these, please reach out to us  so that we can help you!

## 2023-10-12 NOTE — Progress Notes (Signed)
 Subjective:   Ann Russell is a 83 y.o. who presents for a Medicare Wellness preventive visit.  As a reminder, Annual Wellness Visits don't include a physical exam, and some assessments may be limited, especially if this visit is performed virtually. We may recommend an in-person follow-up visit with your provider if needed.  Visit Complete: Virtual I connected with  Tiersa E Zeitz on 10/12/23 by a audio enabled telemedicine application and verified that I am speaking with the correct person using two identifiers.  Patient Location: Home  Provider Location: Home Office  I discussed the limitations of evaluation and management by telemedicine. The patient expressed understanding and agreed to proceed.  Vital Signs: Because this visit was a virtual/telehealth visit, some criteria may be missing or patient reported. Any vitals not documented were not able to be obtained and vitals that have been documented are patient reported.  VideoDeclined- This patient declined Librarian, academic. Therefore the visit was completed with audio only.  Persons Participating in Visit: Patient.  AWV Questionnaire: No: Patient Medicare AWV questionnaire was not completed prior to this visit.  Cardiac Risk Factors include: advanced age (>2men, >62 women);dyslipidemia;sedentary lifestyle     Objective:    There were no vitals filed for this visit. There is no height or weight on file to calculate BMI.     10/12/2023    9:41 AM 10/06/2022   11:35 AM 07/18/2021    2:21 PM 07/03/2021    7:16 PM 06/29/2021   10:49 AM 06/26/2021    8:07 PM 06/25/2021   10:25 PM  Advanced Directives  Does Patient Have a Medical Advance Directive? No No No  No Yes No  Type of Careers adviser;Living will   Does patient want to make changes to medical advance directive?      No - Patient declined   Would patient like information on creating a medical advance  directive? No - Patient declined No - Patient declined  No - Patient declined       Current Medications (verified) Outpatient Encounter Medications as of 10/12/2023  Medication Sig   acetaminophen  (TYLENOL ) 650 MG CR tablet Take 650 mg by mouth 2 (two) times a day.   B COMPLEX VITAMINS SL Place 1 tablet under the tongue daily.   buPROPion  (WELLBUTRIN  XL) 150 MG 24 hr tablet Take 1 tablet (150 mg total) by mouth daily.   busPIRone  (BUSPAR ) 15 MG tablet TAKE 1 TABLET TWICE DAILY   calcitRIOL  (ROCALTROL ) 0.25 MCG capsule TAKE 1 CAPSULE EVERY DAY   Calcium  Carb-Cholecalciferol (CALCIUM  600 + D PO) Take 1 tablet by mouth daily.   ferrous fumarate-b12-vitamic C-folic acid  (TRINSICON / FOLTRIN) capsule Take 1 capsule by mouth 2 (two) times daily.   FLUoxetine  (PROZAC ) 20 MG capsule TAKE 3 CAPSULES EVERY MORNING   midodrine  (PROAMATINE ) 5 MG tablet Take 1 tablet (5 mg total) by mouth 2 (two) times daily with a meal.   Multiple Vitamin (MULTI-VITAMINS) TABS Take 2 tablets by mouth 2 (two) times daily.   omeprazole  (PRILOSEC) 40 MG capsule TAKE 1 CAPSULE EVERY DAY   oxybutynin  (DITROPAN -XL) 5 MG 24 hr tablet TAKE 1 TABLET AT BEDTIME   pregabalin  (LYRICA ) 100 MG capsule Take 1 capsule (100 mg total) by mouth 3 (three) times daily.   Pyridoxine  HCl (VITAMIN B6 PO) Take 1 tablet by mouth daily. With b12   valACYclovir  (VALTREX ) 1000 MG tablet Take 1 tablet (1,000 mg  total) by mouth daily as needed (fever blisters).   denosumab (PROLIA) 60 MG/ML SOSY injection Inject 60 mg into the skin every 6 (six) months. (Patient not taking: Reported on 10/12/2023)   predniSONE  (DELTASONE ) 10 MG tablet Take 6 tabs on day 1, 5 tabs on day 2, 4 tabs on day 3, 3 tabs on day 4, 2 tabs on day 5, 1 tab on day 6 (Patient not taking: Reported on 10/12/2023)   No facility-administered encounter medications on file as of 10/12/2023.    Allergies (verified) Patient has no known allergies.   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  Takes Vasotec  for kidneys.-stage 3   Depression    Hyperlipemia    Hypertension    h/o had gastric bypass and was able to come off meds   Neuropathy    Pulmonary embolism (HCC)    Sleep apnea    no cpap since her gastric bypass   Past Surgical History:  Procedure Laterality Date   ABDOMINAL HYSTERECTOMY     Buniectomy     bil   CHOLECYSTECTOMY     ESOPHAGOGASTRODUODENOSCOPY (EGD) WITH PROPOFOL  N/A 06/29/2021   Procedure: ESOPHAGOGASTRODUODENOSCOPY (EGD) WITH PROPOFOL ;  Surgeon: Luke Salaam, MD;  Location: [facility] ENDOSCOPY;  Service: Gastroenterology;  Laterality: N/A;   FACIAL COSMETIC SURGERY  2011   GASTRIC BYPASS  2010   IR IVC FILTER PLMT / S&I /IMG GUID/MOD SED  07/01/2021   PULMONARY THROMBECTOMY Bilateral 06/22/2017   Procedure: PULMONARY THROMBECTOMY;  Surgeon: Celso College, MD;  Location: [facility] INVASIVE CV LAB;  Service: Cardiovascular;  Laterality: Bilateral;   Rotor cuff     right   SCLERAL BUCKLE  11/07/2011   Procedure: SCLERAL BUCKLE;  Surgeon: Rexene Catching, MD;  Location: [facility] OR;  Service: Ophthalmology;  Laterality: Left;   TOTAL KNEE ARTHROPLASTY Right 12/03/2018   Procedure: RIGHT TOTAL KNEE ARTHROPLASTY;  Surgeon: Molli Angelucci, MD;  Location: [facility] ORS;  Service: Orthopedics;  Laterality: Right;   Family History  Problem Relation Age of Onset   Heart disease Mother    Dementia Mother    Neuropathy Father    Diabetes Brother    Breast cancer Neg Hx    Social History   Socioeconomic History   Marital status: Significant Other    Spouse name: Not on file   Number of children: 1   Years of education: Not on file   Highest education level: Not on file  Occupational History   Occupation: retired  Tobacco Use   Smoking status: Never   Smokeless tobacco: Never  Vaping Use   Vaping status: Never Used  Substance and Sexual Activity    Alcohol use: No   Drug use: No   Sexual activity: Not Currently  Other Topics Concern   Not on file  Social History Narrative   Not on file   Social Drivers of Health   Financial Resource Strain: Low Risk  (10/12/2023)   Overall Financial Resource Strain (CARDIA)    Difficulty of Paying Living Expenses: Not hard at all  Food Insecurity: No Food Insecurity (10/12/2023)   Hunger Vital Sign    Worried About Running Out of Food in the Last Year: Never true    Ran Out of Food in the Last Year: Never true  Transportation Needs: No Transportation Needs (  10/12/2023)   PRAPARE - Administrator, Civil Service (Medical): No    Lack of Transportation (Non-Medical): No  Physical Activity: Insufficiently Active (10/12/2023)   Exercise Vital Sign    Days of Exercise per Week: 2 days    Minutes of Exercise per Session: 20 min  Stress: No Stress Concern Present (10/12/2023)   [employer] of Occupational Health - Occupational Stress Questionnaire    Feeling of Stress: Only a little  Social Connections: Moderately Isolated (10/12/2023)   Social Connection and Isolation Panel    Frequency of Communication with Friends and Family: Twice a week    Frequency of Social Gatherings with Friends and Family: Once a week    Attends Religious Services: Never    Database administrator or Organizations: No    Attends Engineer, structural: Never    Marital Status: Living with partner    Tobacco Counseling Counseling given: Not Answered    Clinical Intake:  Pre-visit preparation completed: Yes  Pain : No/denies pain     BMI - recorded: 21.7 Nutritional Status: BMI of 19-24  Normal Nutritional Risks: None Diabetes: No  No results found for: HGBA1C   How often do you need to have someone help you when you read instructions, pamphlets, or other written materials from your doctor or pharmacy?: 1 - Never  Interpreter Needed?: No  Information entered by :: Dellie Fergusson,  LPN   Activities of Daily Living    10/12/2023    9:43 AM  In your present state of health, do you have any difficulty performing the following activities:  Hearing? 0  Vision? 0  Difficulty concentrating or making decisions? 0  Walking or climbing stairs? 1  Comment IN SCOOTER  Dressing or bathing? 0  Doing errands, shopping? 1  Preparing Food and eating ? N  Using the Toilet? N  In the past six months, have you accidently leaked urine? N  Do you have problems with loss of bowel control? N  Managing your Medications? N  Managing your Finances? N  Housekeeping or managing your Housekeeping? Y    Patient Care Team: Carollynn Cirri, NP as PCP - General (Internal Medicine) Jerlyn Moons, MD as Consulting Physician (Orthopedic Surgery) Spencer Dy, OD (Optometry)  I have updated your Care Teams any recent Medical Services you may have received from other providers in the past year.     Assessment:   This is a routine wellness examination for Sarabella.  Hearing/Vision screen Hearing Screening - Comments:: NO AIDS Vision Screening - Comments:: READERS- DR.SHADE   Goals Addressed             This Visit's Progress    DIET - INCREASE WATER INTAKE         Depression Screen     10/12/2023    9:40 AM 10/06/2022   11:33 AM 09/29/2021    1:53 PM 02/03/2021    3:05 PM  PHQ 2/9 Scores  PHQ - 2 Score 1 4 0 0  PHQ- 9 Score 1 5      Fall Risk     10/12/2023    9:42 AM 10/06/2022   11:35 AM 09/29/2021    1:56 PM  Fall Risk   Falls in the past year? 0 1 1  Number falls in past yr: 0 0 0  Injury with Fall? 0 0 0  Risk for fall due to : No Fall Risks No Fall Risks;History of fall(s);Impaired mobility Impaired  mobility;Impaired balance/gait  Follow up Falls evaluation completed Falls prevention discussed;Falls evaluation completed Falls evaluation completed      Data saved with a previous flowsheet row definition    MEDICARE RISK AT HOME:  Medicare Risk at Home Any  stairs in or around the home?: No If so, are there any without handrails?: No Home free of loose throw rugs in walkways, pet beds, electrical cords, etc?: Yes Adequate lighting in your home to reduce risk of falls?: Yes Life alert?: No Use of a cane, walker or w/c?: Yes (SCOOTER/WHEELCHAIR BOUND) Grab bars in the bathroom?: Yes Shower chair or bench in shower?: No Elevated toilet seat or a handicapped toilet?: No  TIMED UP AND GO:  Was the test performed?  No  Cognitive Function: 6CIT completed        10/12/2023    9:44 AM 10/06/2022   11:40 AM 09/29/2021    1:58 PM  6CIT Screen  What Year? 4 points 0 points 0 points  What month? 0 points 0 points 0 points  What time? 3 points 0 points 0 points  Count back from 20 0 points 0 points 0 points  Months in reverse 0 points 4 points 2 points  Repeat phrase 4 points 10 points 10 points  Total Score 11 points 14 points 12 points    Immunizations Immunization History  Administered Date(s) Administered   Fluad Quad(high Dose 65+) 02/03/2021   Influenza Inj Mdck Quad Pf 02/21/2018   Influenza, High Dose Seasonal PF 05/25/2016   Influenza,inj,Quad PF,6+ Mos 01/23/2020   Influenza-Unspecified 02/07/2017   PFIZER Comirnaty(Gray Top)Covid-19 Tri-Sucrose Vaccine 07/23/2019, 08/06/2019    Screening Tests Health Maintenance  Topic Date Due   DTaP/Tdap/Td (1 - Tdap) Never done   Pneumococcal Vaccine: 50+ Years (1 of 2 - PCV) Never done   Zoster Vaccines- Shingrix (1 of 2) Never done   COVID-19 Vaccine (3 - Pfizer risk series) 09/03/2019   INFLUENZA VACCINE  11/23/2023   Medicare Annual Wellness (AWV)  10/11/2024   DEXA SCAN  Completed   HPV VACCINES  Aged Out   Meningococcal B Vaccine  Aged Out    Health Maintenance  Health Maintenance Due  Topic Date Due   DTaP/Tdap/Td (1 - Tdap) Never done   Pneumococcal Vaccine: 50+ Years (1 of 2 - PCV) Never done   Zoster Vaccines- Shingrix (1 of 2) Never done   COVID-19 Vaccine (3 -  Pfizer risk series) 09/03/2019   Health Maintenance Items Addressed: DECLINES PNA, COVID, SHINGRIX SHOTS; DECLINES MAMMOGRAM, COLONOSCOPY & BONE DENSITY  Additional Screening:  Vision Screening: Recommended annual ophthalmology exams for early detection of glaucoma and other disorders of the eye. Would you like a referral to an eye doctor? No    Dental Screening: Recommended annual dental exams for proper oral hygiene  Community Resource Referral / Chronic Care Management: CRR required this visit?  No   CCM required this visit?  No   Plan:    I have personally reviewed and noted the following in the patient's chart:   Medical and social history Use of alcohol, tobacco or illicit drugs  Current medications and supplements including opioid prescriptions. Patient is not currently taking opioid prescriptions. Functional ability and status Nutritional status Physical activity Advanced directives List of other physicians Hospitalizations, surgeries, and ER visits in previous 12 months Vitals Screenings to include cognitive, depression, and falls Referrals and appointments  In addition, I have reviewed and discussed with patient certain preventive protocols, quality metrics, and  best practice recommendations. A written personalized care plan for preventive services as well as general preventive health recommendations were provided to patient.   Pinky Bright, LPN   [id number]   After Visit Summary: (MyChart) Due to this being a telephonic visit, the after visit summary with patients personalized plan was offered to patient via MyChart   Notes: Nothing significant to report at this time.

## 2023-11-02 ENCOUNTER — Other Ambulatory Visit: Payer: Self-pay | Admitting: Internal Medicine

## 2023-11-03 NOTE — Telephone Encounter (Signed)
 Requested medication (s) are due for refill today: except for calcitriol   Requested medication (s) are on the active medication list: yes  Last refill:  08/21/23 //calcitriol : 01/12/23  Future visit scheduled: no  Notes to clinic:  pt has h/o 2 previous [facility] visits. Pt is due for appt    Requested Prescriptions  Pending Prescriptions Disp Refills   calcitRIOL  (ROCALTROL ) 0.25 MCG capsule [Pharmacy Med Name: Calcitriol  Oral Capsule 0.25 MCG] 90 capsule 3    Sig: TAKE 1 CAPSULE EVERY DAY     Endocrinology:  Vitamins - Vitamin D  Supplementation - calcitriol  Failed - 11/03/2023  3:01 PM      Failed - Phosphate in normal range and within 360 days    No results found for: PHOS       Failed - PTH in normal range and within 360 days    No results found for: IOPTH, PTHINTACTFNA, PTH       Failed - Ca in normal range and within 360 days    Calcium   Date Value Ref Range Status  09/22/2021 10.7 (H) 8.6 - 10.4 mg/dL Final         Passed - Valid encounter within last 12 months    Recent Outpatient Visits   None             oxybutynin  (DITROPAN -XL) 5 MG 24 hr tablet [Pharmacy Med Name: oxyBUTYnin  Chloride ER Oral Tablet Extended Release 24 Hour 5 MG] 90 tablet 3    Sig: TAKE 1 TABLET AT BEDTIME     Urology:  Bladder Agents Passed - 11/03/2023  3:01 PM      Passed - Valid encounter within last 12 months    Recent Outpatient Visits   None             busPIRone  (BUSPAR ) 15 MG tablet [Pharmacy Med Name: busPIRone  HCl Oral Tablet 15 MG] 180 tablet 3    Sig: TAKE 1 TABLET TWICE DAILY     Psychiatry: Anxiolytics/Hypnotics - Non-controlled Passed - 11/03/2023  3:01 PM      Passed - Valid encounter within last 12 months    Recent Outpatient Visits   None             omeprazole  (PRILOSEC) 40 MG capsule [Pharmacy Med Name: Omeprazole  Oral Capsule Delayed Release 40 MG] 90 capsule 3    Sig: TAKE 1 CAPSULE EVERY DAY     Gastroenterology: Proton Pump Inhibitors Passed -  11/03/2023  3:01 PM      Passed - Valid encounter within last 12 months    Recent Outpatient Visits   None

## 2024-10-17 ENCOUNTER — Ambulatory Visit

## 2024-10-29 ENCOUNTER — Ambulatory Visit
